# Patient Record
Sex: Female | Born: 1980
Health system: Southern US, Community
[De-identification: ages and names within clinical notes are randomized; demographics above are authoritative.]

## PROBLEM LIST (undated history)

## (undated) ENCOUNTER — Inpatient Hospital Stay (HOSPITAL_COMMUNITY): Payer: Self-pay

## (undated) ENCOUNTER — Ambulatory Visit (HOSPITAL_COMMUNITY): Admission: EM | Payer: MEDICAID | Source: Home / Self Care

## (undated) DIAGNOSIS — N390 Urinary tract infection, site not specified: Secondary | ICD-10-CM

## (undated) DIAGNOSIS — N949 Unspecified condition associated with female genital organs and menstrual cycle: Secondary | ICD-10-CM

## (undated) DIAGNOSIS — I10 Essential (primary) hypertension: Secondary | ICD-10-CM

## (undated) DIAGNOSIS — O24419 Gestational diabetes mellitus in pregnancy, unspecified control: Secondary | ICD-10-CM

## (undated) HISTORY — DX: Unspecified condition associated with female genital organs and menstrual cycle: N94.9

## (undated) HISTORY — PX: NO PAST SURGERIES: SHX2092

## (undated) HISTORY — DX: Urinary tract infection, site not specified: N39.0

---

## 2001-06-23 ENCOUNTER — Encounter: Admission: RE | Admit: 2001-06-23 | Discharge: 2001-06-23 | Payer: Self-pay | Admitting: Family Medicine

## 2001-11-16 ENCOUNTER — Emergency Department (HOSPITAL_COMMUNITY): Admission: EM | Admit: 2001-11-16 | Discharge: 2001-11-16 | Payer: Self-pay | Admitting: Emergency Medicine

## 2001-11-16 ENCOUNTER — Emergency Department (HOSPITAL_COMMUNITY): Admission: EM | Admit: 2001-11-16 | Discharge: 2001-11-17 | Payer: Self-pay | Admitting: Emergency Medicine

## 2001-11-17 ENCOUNTER — Emergency Department (HOSPITAL_COMMUNITY): Admission: EM | Admit: 2001-11-17 | Discharge: 2001-11-17 | Payer: Self-pay | Admitting: Emergency Medicine

## 2001-11-19 ENCOUNTER — Emergency Department (HOSPITAL_COMMUNITY): Admission: EM | Admit: 2001-11-19 | Discharge: 2001-11-19 | Payer: Self-pay | Admitting: Emergency Medicine

## 2001-11-23 ENCOUNTER — Emergency Department (HOSPITAL_COMMUNITY): Admission: EM | Admit: 2001-11-23 | Discharge: 2001-11-23 | Payer: Self-pay | Admitting: Emergency Medicine

## 2001-12-03 ENCOUNTER — Emergency Department (HOSPITAL_COMMUNITY): Admission: EM | Admit: 2001-12-03 | Discharge: 2001-12-03 | Payer: Self-pay | Admitting: Emergency Medicine

## 2001-12-05 ENCOUNTER — Emergency Department (HOSPITAL_COMMUNITY): Admission: EM | Admit: 2001-12-05 | Discharge: 2001-12-05 | Payer: Self-pay | Admitting: Emergency Medicine

## 2001-12-10 ENCOUNTER — Emergency Department (HOSPITAL_COMMUNITY): Admission: EM | Admit: 2001-12-10 | Discharge: 2001-12-10 | Payer: Self-pay | Admitting: Emergency Medicine

## 2001-12-11 ENCOUNTER — Emergency Department (HOSPITAL_COMMUNITY): Admission: EM | Admit: 2001-12-11 | Discharge: 2001-12-11 | Payer: Self-pay | Admitting: Emergency Medicine

## 2001-12-15 ENCOUNTER — Emergency Department (HOSPITAL_COMMUNITY): Admission: EM | Admit: 2001-12-15 | Discharge: 2001-12-15 | Payer: Self-pay | Admitting: Emergency Medicine

## 2001-12-20 ENCOUNTER — Emergency Department (HOSPITAL_COMMUNITY): Admission: EM | Admit: 2001-12-20 | Discharge: 2001-12-20 | Payer: Self-pay | Admitting: Emergency Medicine

## 2001-12-20 ENCOUNTER — Encounter: Payer: Self-pay | Admitting: Emergency Medicine

## 2002-01-02 ENCOUNTER — Inpatient Hospital Stay (HOSPITAL_COMMUNITY): Admission: EM | Admit: 2002-01-02 | Discharge: 2002-01-12 | Payer: Self-pay | Admitting: Psychiatry

## 2002-01-24 ENCOUNTER — Emergency Department (HOSPITAL_COMMUNITY): Admission: EM | Admit: 2002-01-24 | Discharge: 2002-01-24 | Payer: Self-pay | Admitting: Emergency Medicine

## 2002-12-12 ENCOUNTER — Emergency Department (HOSPITAL_COMMUNITY): Admission: EM | Admit: 2002-12-12 | Discharge: 2002-12-12 | Payer: Self-pay | Admitting: Emergency Medicine

## 2003-07-09 ENCOUNTER — Emergency Department (HOSPITAL_COMMUNITY): Admission: EM | Admit: 2003-07-09 | Discharge: 2003-07-09 | Payer: Self-pay | Admitting: Emergency Medicine

## 2004-01-10 ENCOUNTER — Emergency Department (HOSPITAL_COMMUNITY): Admission: EM | Admit: 2004-01-10 | Discharge: 2004-01-10 | Payer: Self-pay | Admitting: Emergency Medicine

## 2004-01-21 ENCOUNTER — Emergency Department (HOSPITAL_COMMUNITY): Admission: EM | Admit: 2004-01-21 | Discharge: 2004-01-21 | Payer: Self-pay | Admitting: Emergency Medicine

## 2004-02-04 ENCOUNTER — Inpatient Hospital Stay (HOSPITAL_COMMUNITY): Admission: AD | Admit: 2004-02-04 | Discharge: 2004-02-04 | Payer: Self-pay | Admitting: Family Medicine

## 2004-03-05 ENCOUNTER — Ambulatory Visit: Payer: Self-pay | Admitting: Sports Medicine

## 2004-03-12 ENCOUNTER — Ambulatory Visit: Payer: Self-pay | Admitting: Family Medicine

## 2004-03-16 ENCOUNTER — Ambulatory Visit (HOSPITAL_COMMUNITY): Admission: RE | Admit: 2004-03-16 | Discharge: 2004-03-16 | Payer: Self-pay | Admitting: Family Medicine

## 2004-04-02 ENCOUNTER — Ambulatory Visit: Payer: Self-pay | Admitting: Family Medicine

## 2004-04-09 ENCOUNTER — Ambulatory Visit (HOSPITAL_COMMUNITY): Admission: RE | Admit: 2004-04-09 | Discharge: 2004-04-09 | Payer: Self-pay | Admitting: Family Medicine

## 2004-04-16 ENCOUNTER — Ambulatory Visit: Payer: Self-pay | Admitting: Sports Medicine

## 2004-05-15 ENCOUNTER — Ambulatory Visit: Payer: Self-pay | Admitting: Sports Medicine

## 2004-06-18 ENCOUNTER — Ambulatory Visit: Payer: Self-pay | Admitting: Family Medicine

## 2004-06-24 ENCOUNTER — Ambulatory Visit: Payer: Self-pay | Admitting: Family Medicine

## 2004-07-03 ENCOUNTER — Ambulatory Visit: Payer: Self-pay | Admitting: Family Medicine

## 2004-07-09 ENCOUNTER — Encounter (INDEPENDENT_AMBULATORY_CARE_PROVIDER_SITE_OTHER): Payer: Self-pay | Admitting: *Deleted

## 2004-07-09 LAB — CONVERTED CEMR LAB

## 2004-07-21 ENCOUNTER — Encounter: Admission: RE | Admit: 2004-07-21 | Discharge: 2004-07-21 | Payer: Self-pay | Admitting: Family Medicine

## 2004-08-05 ENCOUNTER — Ambulatory Visit: Payer: Self-pay | Admitting: Family Medicine

## 2004-08-05 ENCOUNTER — Other Ambulatory Visit: Admission: RE | Admit: 2004-08-05 | Discharge: 2004-08-05 | Payer: Self-pay | Admitting: Family Medicine

## 2004-08-12 ENCOUNTER — Ambulatory Visit: Payer: Self-pay | Admitting: Family Medicine

## 2004-08-21 ENCOUNTER — Ambulatory Visit: Payer: Self-pay | Admitting: Family Medicine

## 2004-08-26 ENCOUNTER — Ambulatory Visit: Payer: Self-pay | Admitting: Family Medicine

## 2004-09-02 ENCOUNTER — Ambulatory Visit: Payer: Self-pay | Admitting: Family Medicine

## 2004-09-07 ENCOUNTER — Ambulatory Visit: Payer: Self-pay | Admitting: Family Medicine

## 2004-09-10 ENCOUNTER — Inpatient Hospital Stay (HOSPITAL_COMMUNITY): Admission: AD | Admit: 2004-09-10 | Discharge: 2004-09-13 | Payer: Self-pay | Admitting: Obstetrics and Gynecology

## 2004-09-10 ENCOUNTER — Ambulatory Visit: Payer: Self-pay | Admitting: Obstetrics and Gynecology

## 2004-11-13 ENCOUNTER — Emergency Department (HOSPITAL_COMMUNITY): Admission: EM | Admit: 2004-11-13 | Discharge: 2004-11-13 | Payer: Self-pay | Admitting: Emergency Medicine

## 2004-11-19 ENCOUNTER — Ambulatory Visit: Payer: Self-pay | Admitting: Family Medicine

## 2005-03-23 ENCOUNTER — Ambulatory Visit: Payer: Self-pay | Admitting: Sports Medicine

## 2005-04-02 ENCOUNTER — Ambulatory Visit: Payer: Self-pay | Admitting: Family Medicine

## 2005-04-05 ENCOUNTER — Ambulatory Visit (HOSPITAL_COMMUNITY): Admission: RE | Admit: 2005-04-05 | Discharge: 2005-04-05 | Payer: Self-pay | Admitting: Internal Medicine

## 2005-04-06 ENCOUNTER — Ambulatory Visit: Payer: Self-pay | Admitting: Sports Medicine

## 2005-06-02 ENCOUNTER — Ambulatory Visit: Payer: Self-pay | Admitting: Family Medicine

## 2005-06-07 ENCOUNTER — Ambulatory Visit (HOSPITAL_COMMUNITY): Admission: RE | Admit: 2005-06-07 | Discharge: 2005-06-07 | Payer: Self-pay | Admitting: Sports Medicine

## 2005-07-28 ENCOUNTER — Ambulatory Visit: Payer: Self-pay | Admitting: Family Medicine

## 2005-08-10 ENCOUNTER — Inpatient Hospital Stay (HOSPITAL_COMMUNITY): Admission: AD | Admit: 2005-08-10 | Discharge: 2005-08-10 | Payer: Self-pay | Admitting: Family Medicine

## 2005-08-16 ENCOUNTER — Ambulatory Visit: Payer: Self-pay | Admitting: Obstetrics & Gynecology

## 2005-09-08 ENCOUNTER — Ambulatory Visit: Payer: Self-pay | Admitting: Obstetrics and Gynecology

## 2005-09-08 ENCOUNTER — Inpatient Hospital Stay (HOSPITAL_COMMUNITY): Admission: AD | Admit: 2005-09-08 | Discharge: 2005-09-11 | Payer: Self-pay | Admitting: Family Medicine

## 2005-11-02 ENCOUNTER — Ambulatory Visit: Payer: Self-pay | Admitting: Family Medicine

## 2005-11-27 ENCOUNTER — Emergency Department (HOSPITAL_COMMUNITY): Admission: EM | Admit: 2005-11-27 | Discharge: 2005-11-27 | Payer: Self-pay | Admitting: Emergency Medicine

## 2005-12-23 ENCOUNTER — Emergency Department (HOSPITAL_COMMUNITY): Admission: EM | Admit: 2005-12-23 | Discharge: 2005-12-23 | Payer: Self-pay | Admitting: Emergency Medicine

## 2005-12-28 ENCOUNTER — Ambulatory Visit: Payer: Self-pay | Admitting: Family Medicine

## 2006-02-05 ENCOUNTER — Emergency Department (HOSPITAL_COMMUNITY): Admission: EM | Admit: 2006-02-05 | Discharge: 2006-02-05 | Payer: Self-pay | Admitting: Emergency Medicine

## 2006-02-19 ENCOUNTER — Emergency Department (HOSPITAL_COMMUNITY): Admission: EM | Admit: 2006-02-19 | Discharge: 2006-02-19 | Payer: Self-pay | Admitting: Family Medicine

## 2006-03-18 ENCOUNTER — Emergency Department (HOSPITAL_COMMUNITY): Admission: EM | Admit: 2006-03-18 | Discharge: 2006-03-18 | Payer: Self-pay | Admitting: Family Medicine

## 2006-03-23 ENCOUNTER — Emergency Department (HOSPITAL_COMMUNITY): Admission: EM | Admit: 2006-03-23 | Discharge: 2006-03-23 | Payer: Self-pay | Admitting: Family Medicine

## 2006-03-24 ENCOUNTER — Encounter: Payer: Self-pay | Admitting: Family Medicine

## 2006-03-24 ENCOUNTER — Ambulatory Visit: Payer: Self-pay | Admitting: Family Medicine

## 2006-03-24 LAB — CONVERTED CEMR LAB
ALT: 16 units/L (ref 0–35)
Albumin: 4.2 g/dL (ref 3.5–5.2)
BUN: 10 mg/dL (ref 6–23)
Chloride: 104 meq/L (ref 96–112)
Creatinine, Ser: 0.64 mg/dL (ref 0.40–1.20)
Potassium: 4.3 meq/L (ref 3.5–5.3)
Sodium: 138 meq/L (ref 135–145)
Total Bilirubin: 0.4 mg/dL (ref 0.3–1.2)

## 2006-04-07 DIAGNOSIS — Z6841 Body Mass Index (BMI) 40.0 and over, adult: Secondary | ICD-10-CM

## 2006-04-08 ENCOUNTER — Encounter (INDEPENDENT_AMBULATORY_CARE_PROVIDER_SITE_OTHER): Payer: Self-pay | Admitting: *Deleted

## 2006-04-13 ENCOUNTER — Telehealth: Payer: Self-pay | Admitting: *Deleted

## 2006-05-10 ENCOUNTER — Emergency Department (HOSPITAL_COMMUNITY): Admission: EM | Admit: 2006-05-10 | Discharge: 2006-05-10 | Payer: Self-pay | Admitting: Family Medicine

## 2006-06-21 ENCOUNTER — Emergency Department (HOSPITAL_COMMUNITY): Admission: EM | Admit: 2006-06-21 | Discharge: 2006-06-21 | Payer: Self-pay | Admitting: Family Medicine

## 2006-09-01 ENCOUNTER — Telehealth (INDEPENDENT_AMBULATORY_CARE_PROVIDER_SITE_OTHER): Payer: Self-pay | Admitting: *Deleted

## 2006-09-06 ENCOUNTER — Ambulatory Visit: Payer: Self-pay | Admitting: Family Medicine

## 2006-09-06 IMAGING — US US OB COMP +14 WK
1 series · 13 of 28 positions shown · non-contrast
Comparison: none

CLINICAL DATA: Uncertain dates.  Patient was on Depo-Provera.

[Series 1: us ob comp +14 wk · 0.29mm/px · 13 of 90 slices shown]
[im 4/90]
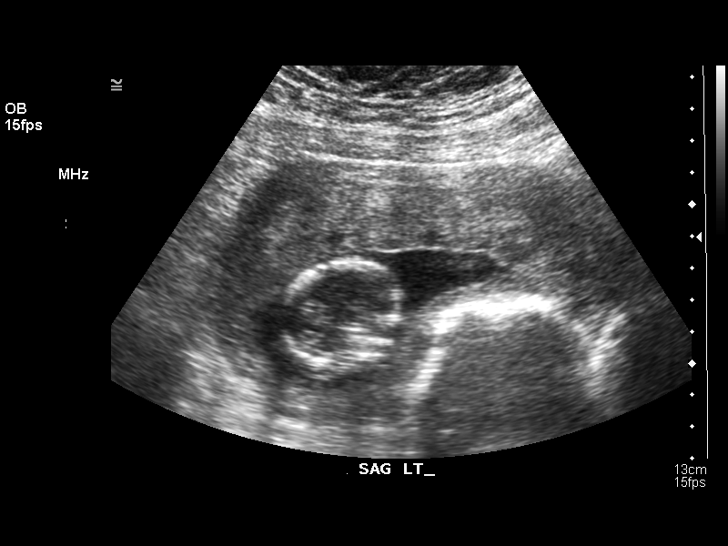
[im 10/90]
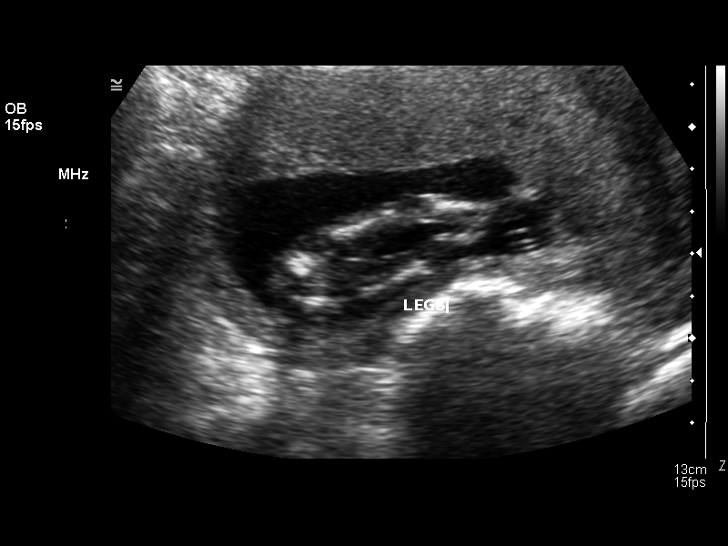
[im 17/90]
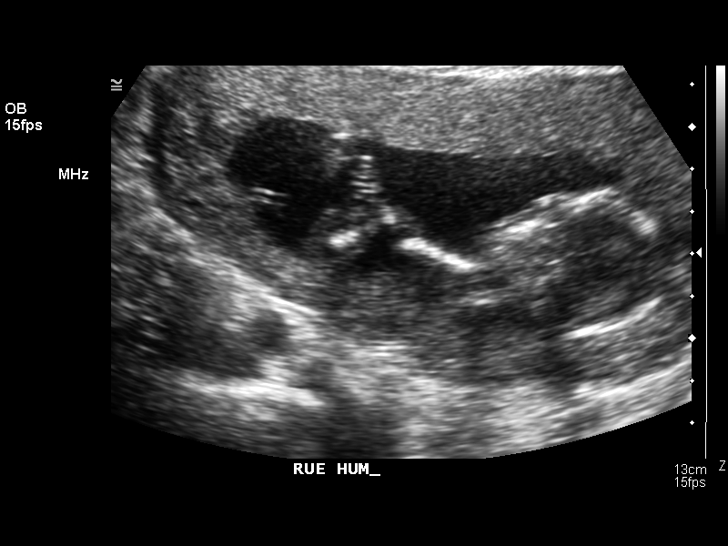
[im 24/90]
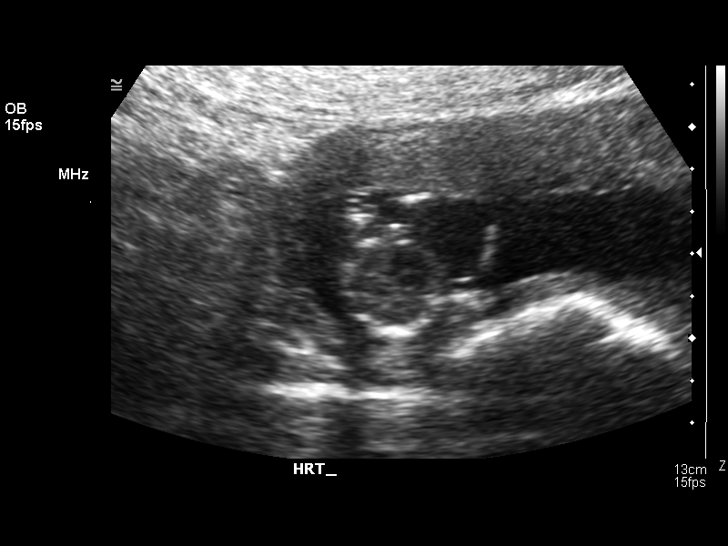
[im 30/90]
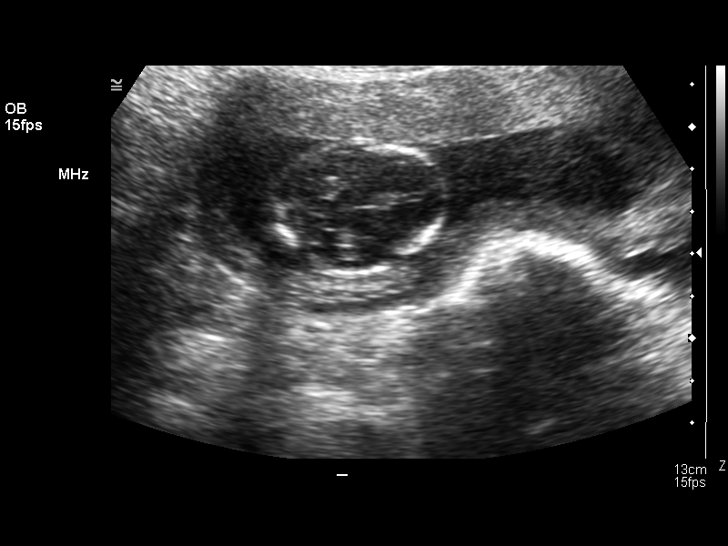
[im 37/90]
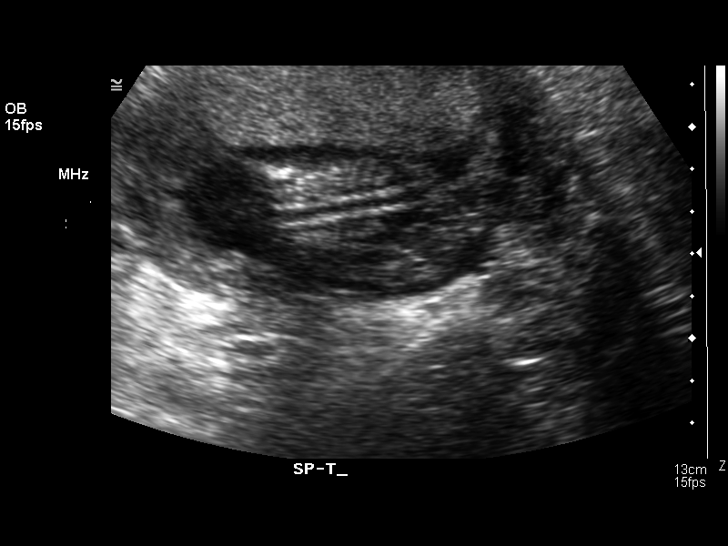
[im 47/90]
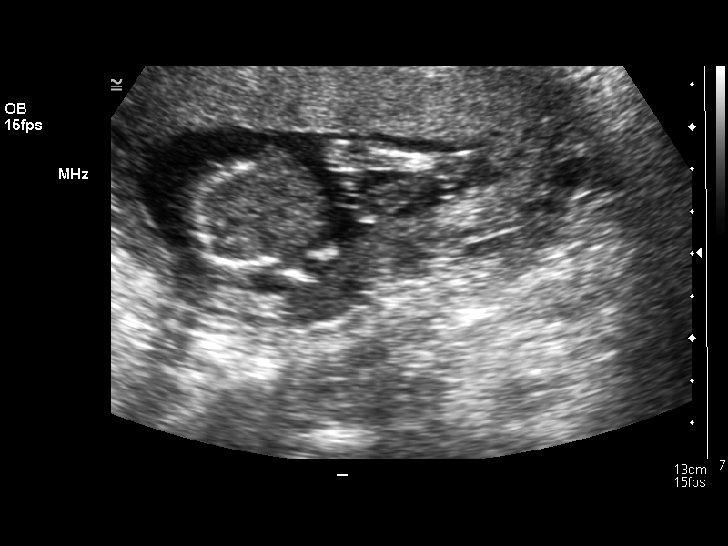
[im 53/90]
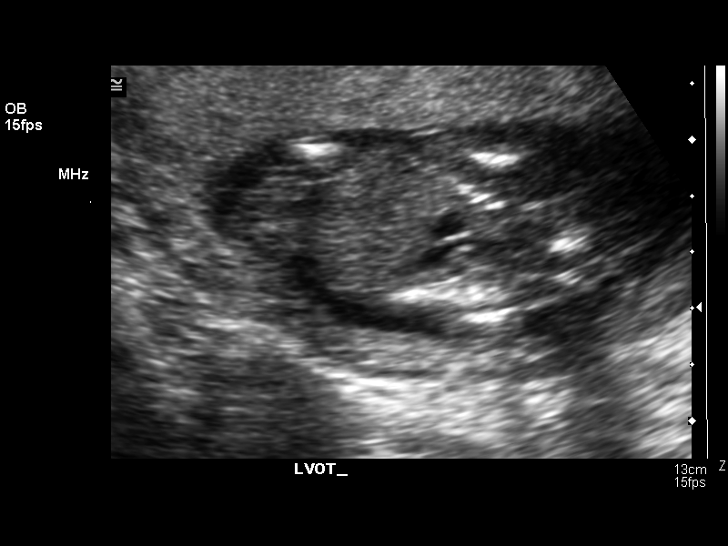
[im 60/90]
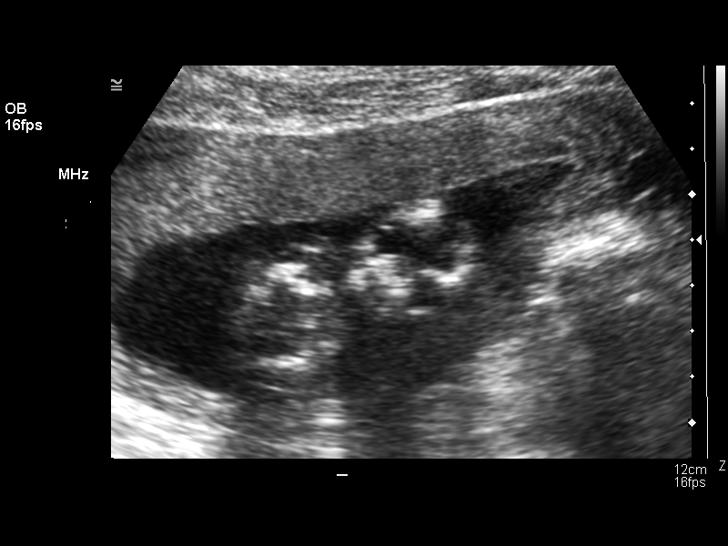
[im 66/90]
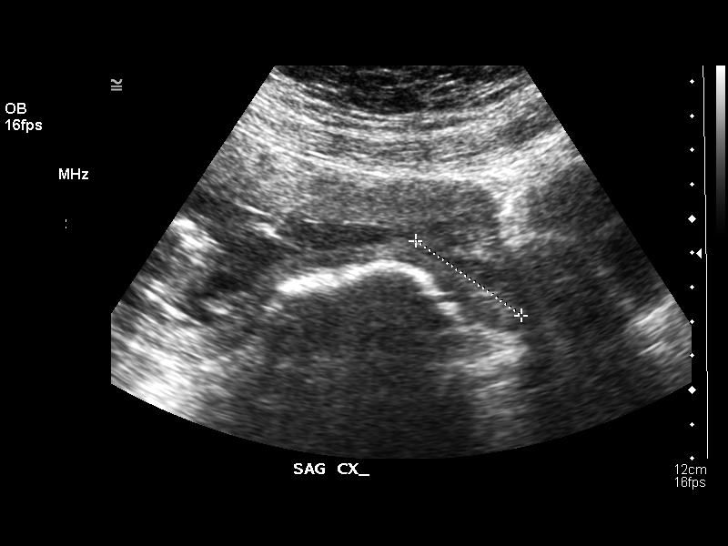
[im 73/90]
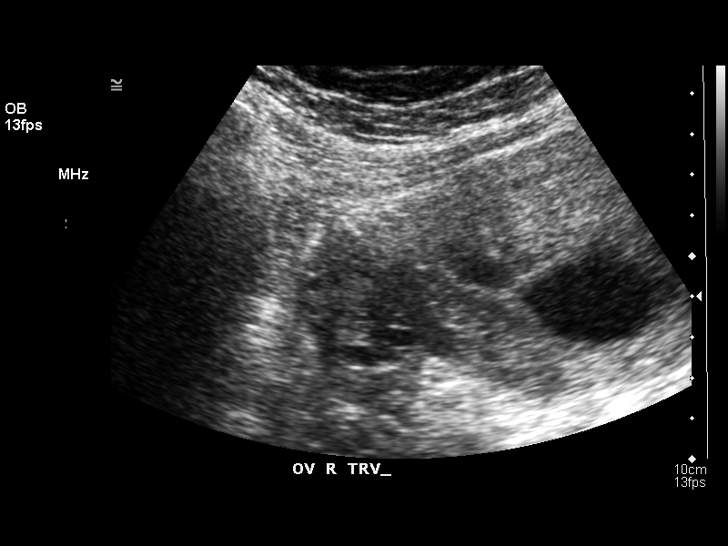
[im 80/90]
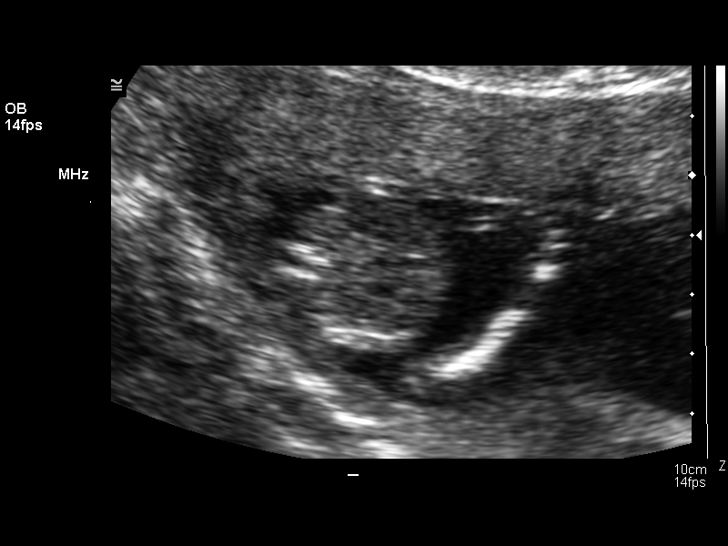
[im 86/90]
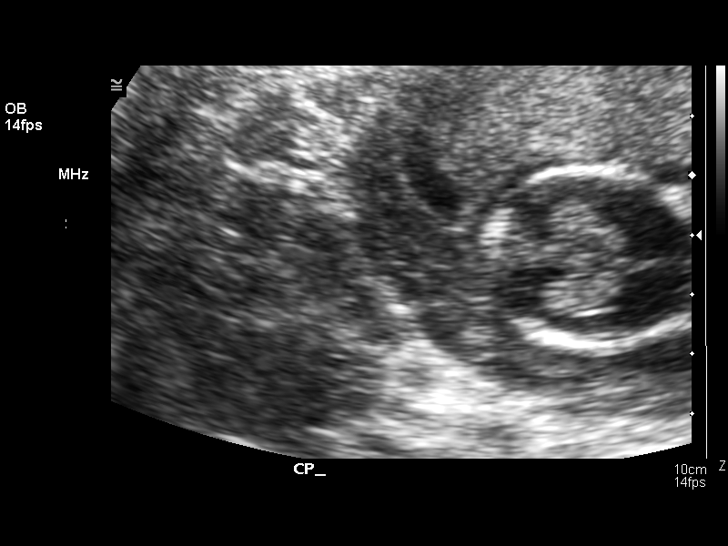

[13 of 28 positions shown; findings below may reference images not displayed]

OBSTETRICAL ULTRASOUND:
Number of Fetuses:  1
Heart Rate:  157
Movement:  Yes
Breathing:  No  
Presentation:    Variable
Placental Location:  Anterior
Grade: I
Previa:  No
Amniotic Fluid (Subjective):  Normal
Amniotic Fluid (Objective):   3.2 cm Vertical pocket 

FETAL BIOMETRY
BPD:  3.0 cm   15 w 4 d
HC:  11.2 cm   15 w 3 d
AC:  9.2 cm   15 w 3 d
FL:  1.8 cm   15 w 3 d

MEAN GA:  15 w 3 d
Assigned GA:  20 w 3 d

FETAL ANATOMY
Lateral Ventricles:    Visualized 
Thalami/CSP:      Visualized 
Posterior Fossa:  Visualized 
Stomach on Left:      Visualized 
3 Vessel Cord:    Visualized 
Kidneys:  Visualized 
Bladder:  Visualized 

Evaluation limited by:  Maternal habitus and early gestational age 

MATERNAL UTERINE AND ADNEXAL FINDINGS
Cervix:   Not evaluated.  Cervix deviates to the left.  
Both ovaries are seen with the right ovary measuring 3.7 x 1.9 x 2.4 cm and the left measuring 3.9 x 2.1 x 2.2 cm.
IMPRESSION: 1.  Single intrauterine pregnancy demonstrating an estimated gestational age by ultrasound of 15 weeks and 3 days.  This is 5 weeks behind expected estimated gestational age of 20 weeks and 3 days by uncertain LMP and suggests inappropriate dating by LMP.  
2.  A limited anatomic exam was possible due to the current estimated gestational age.  Follow-up for growth is recommended in three weeks to assess for allowance of improved anatomic assessment.  
3.  Normal ovaries. 

</u12:p>

## 2006-09-29 ENCOUNTER — Telehealth (INDEPENDENT_AMBULATORY_CARE_PROVIDER_SITE_OTHER): Payer: Self-pay | Admitting: *Deleted

## 2006-09-30 IMAGING — US US OB FOLLOW-UP
1 series · 13 of 28 positions shown · non-contrast
Comparison: none

CLINICAL DATA: Reassess fetal anatomy.

[Series 1: us ob follow-up · 0.28mm/px · 13 of 39 slices shown]
[im 2/39]
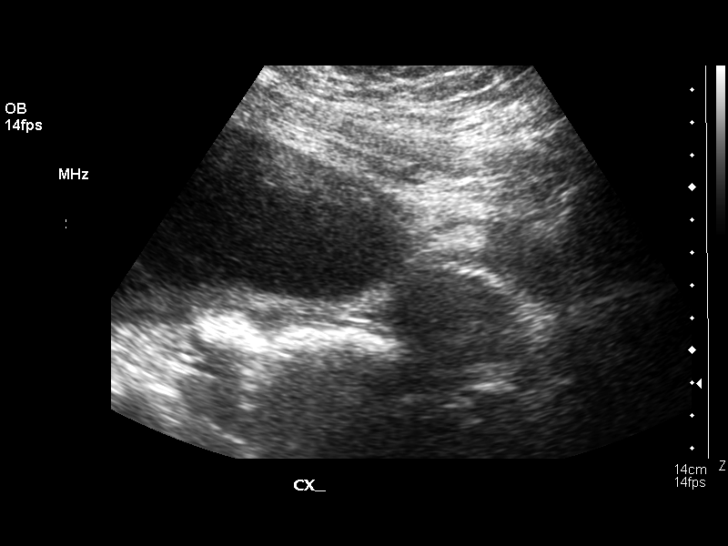
[im 5/39]
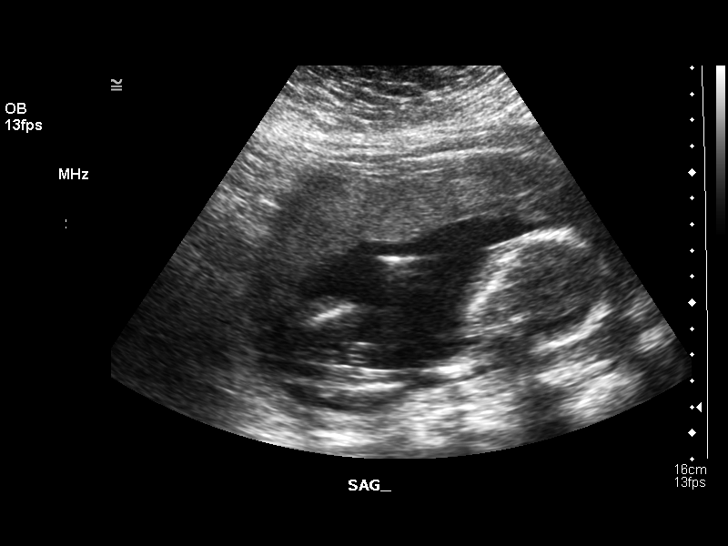
[im 8/39]
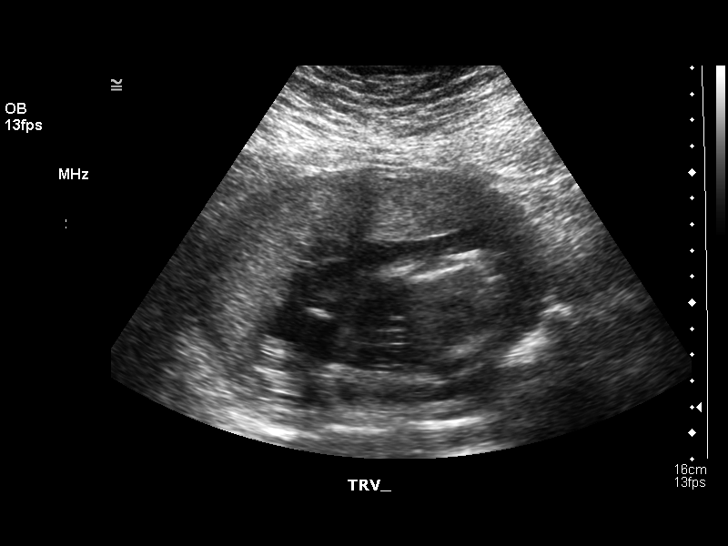
[im 10/39]
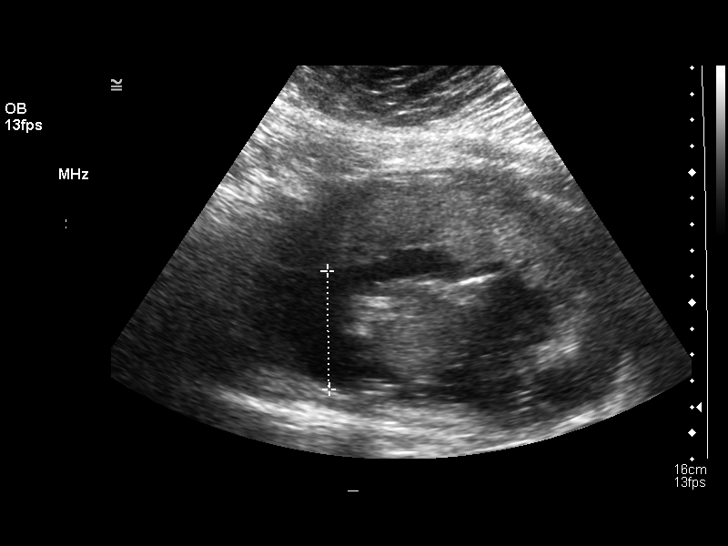
[im 13/39]
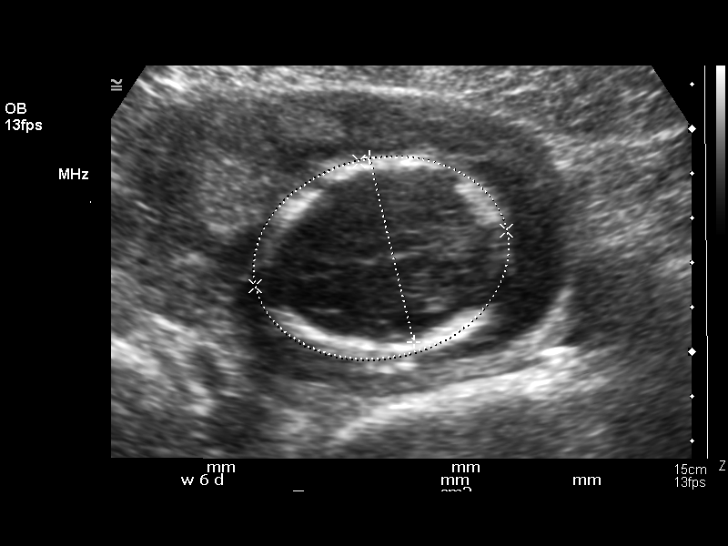
[im 16/39]
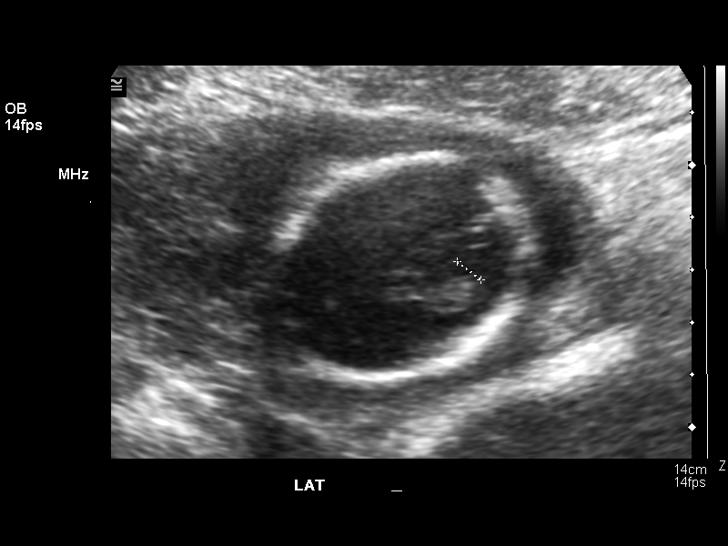
[im 20/39]
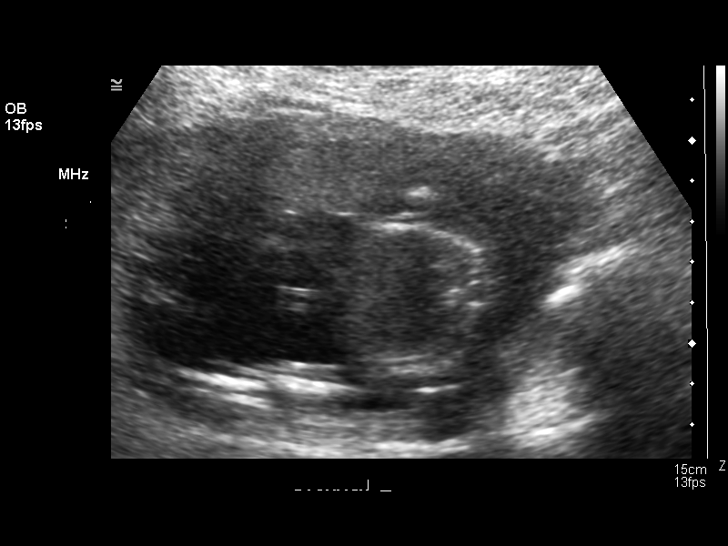
[im 23/39]
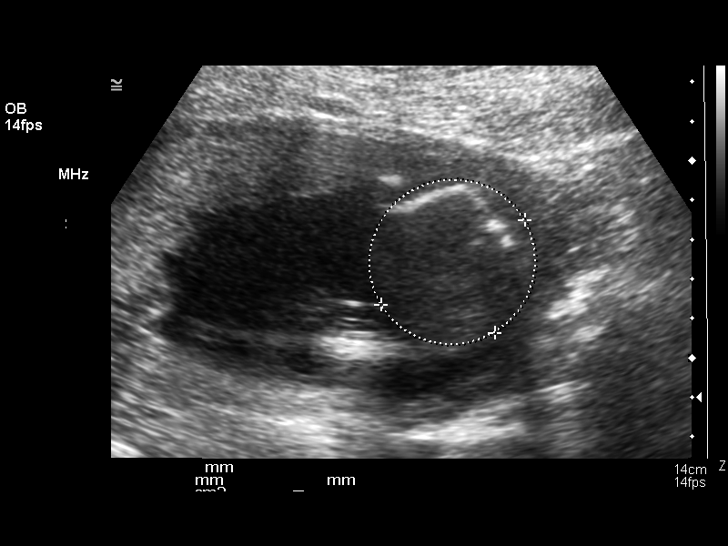
[im 26/39]
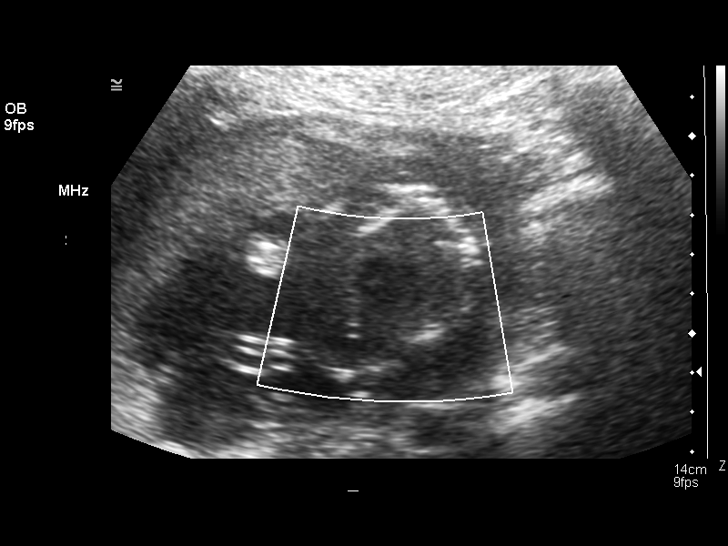
[im 29/39]
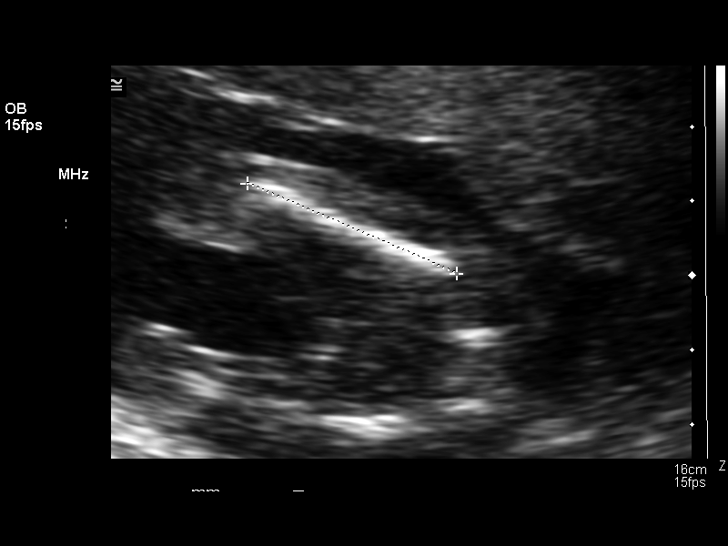
[im 31/39]
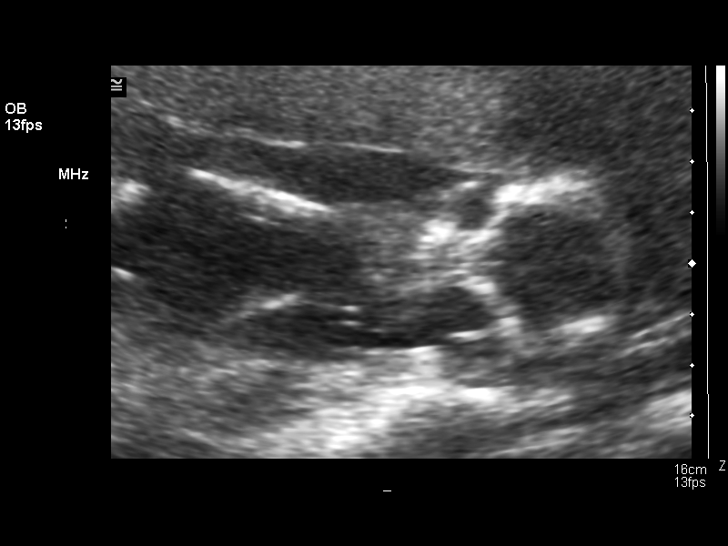
[im 34/39]
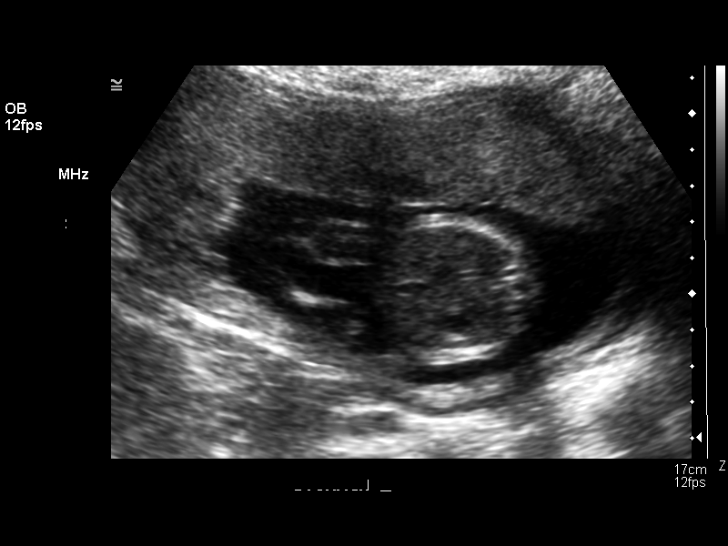
[im 37/39]
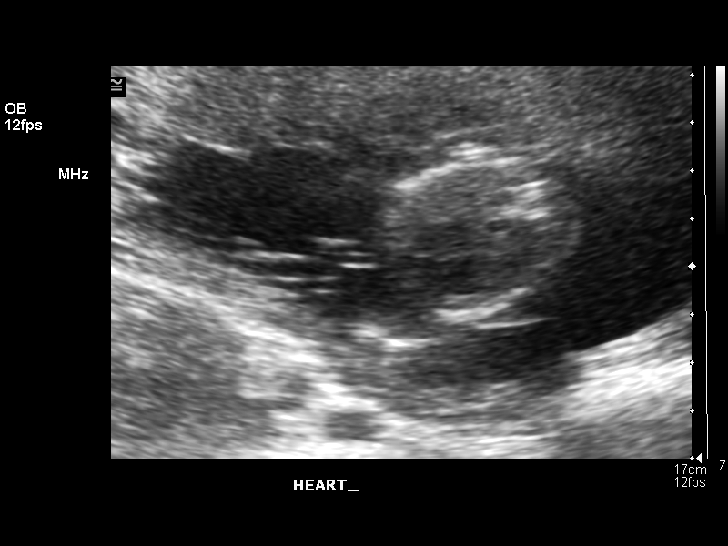

[13 of 28 positions shown; findings below may reference images not displayed]

OBSTETRICAL ULTRASOUND RE-EVALUATION:
Overall scan resolution was markedly hampered by maternal body habitus and limited scanning planes.
Number of Fetuses:  1
Heart Rate:  147
Movement:  Yes
Breathing:  No
Presentation:  Cephalic
Placental Location:  Anterior
Grade:  I
Previa:  No
Amniotic Fluid (subjective):  Normal
Amniotic Fluid (objective):  4.5 cm Vertical pocket 

FETAL BIOMETRY
BPD:  4.3 cm   18 w 6 d
HC:  16.0 cm   18 w 6 d
AC:  13.3 cm   18 w 6 d
FL:  3.0 cm   19 w 3 d

Mean GA:  19 w 0 d
Assigned GA:  18 w 6 d

FETAL ANATOMY
Lateral Ventricles:  Visualized 
Thalami/CSP:  Visualized 
Posterior Fossa:  Visualized  
Nuchal Region:  Visualized 
Spine:  Not visualized  
4 Chamber Heart on Left:  Not visualized 
Stomach on Left:  Visualized 
3 Vessel Cord:  Visualized 
Cord Insertion Site:  Visualized 
Kidneys:  Visualized 
Bladder:  Visualized 
Extremities:  Previously seen 

Evaluation limited by:  Maternal habitus.

MATERNAL UTERINE AND ADNEXAL FINDINGS
Cervix:  3.1 cm transabdominally
IMPRESSION: 1. Single intrauterine pregnancy demonstrating an estimated gestational age by ultrasound of 19 weeks and 0 days. Correlation with expected estimated gestational age by early ultrasound of 18 weeks and 6 days suggests appropriate growth.  The anatomic exam remains limited due to maternal habitus combined with fetal positioning and the fetal heart, spine face, heel and 5th digit could not be adequately assessed today. Follow-up to reassess the heart and spine would be recommended.

## 2006-10-03 ENCOUNTER — Telehealth (INDEPENDENT_AMBULATORY_CARE_PROVIDER_SITE_OTHER): Payer: Self-pay | Admitting: *Deleted

## 2006-10-03 ENCOUNTER — Encounter: Payer: Self-pay | Admitting: *Deleted

## 2006-10-04 ENCOUNTER — Ambulatory Visit: Payer: Self-pay | Admitting: Family Medicine

## 2006-10-04 LAB — CONVERTED CEMR LAB
Bilirubin Urine: NEGATIVE
Nitrite: NEGATIVE
Protein, U semiquant: NEGATIVE
Urobilinogen, UA: 4
pH: 7.5

## 2006-11-08 ENCOUNTER — Ambulatory Visit: Payer: Self-pay | Admitting: Family Medicine

## 2006-11-23 ENCOUNTER — Telehealth (INDEPENDENT_AMBULATORY_CARE_PROVIDER_SITE_OTHER): Payer: Self-pay | Admitting: *Deleted

## 2006-11-23 ENCOUNTER — Ambulatory Visit: Payer: Self-pay | Admitting: Family Medicine

## 2006-11-23 LAB — CONVERTED CEMR LAB: Beta hcg, urine, semiquantitative: NEGATIVE

## 2006-11-25 ENCOUNTER — Telehealth: Payer: Self-pay | Admitting: *Deleted

## 2006-11-26 ENCOUNTER — Emergency Department (HOSPITAL_COMMUNITY): Admission: EM | Admit: 2006-11-26 | Discharge: 2006-11-26 | Payer: Self-pay | Admitting: Emergency Medicine

## 2006-11-29 ENCOUNTER — Encounter (INDEPENDENT_AMBULATORY_CARE_PROVIDER_SITE_OTHER): Payer: Self-pay | Admitting: *Deleted

## 2006-11-30 ENCOUNTER — Telehealth: Payer: Self-pay | Admitting: *Deleted

## 2006-12-01 ENCOUNTER — Encounter: Payer: Self-pay | Admitting: *Deleted

## 2006-12-06 ENCOUNTER — Encounter (INDEPENDENT_AMBULATORY_CARE_PROVIDER_SITE_OTHER): Payer: Self-pay | Admitting: *Deleted

## 2006-12-15 ENCOUNTER — Encounter: Payer: Self-pay | Admitting: Family Medicine

## 2006-12-23 ENCOUNTER — Telehealth: Payer: Self-pay | Admitting: *Deleted

## 2007-01-03 ENCOUNTER — Ambulatory Visit: Payer: Self-pay | Admitting: Family Medicine

## 2007-01-03 LAB — CONVERTED CEMR LAB: Rapid Strep: POSITIVE

## 2007-01-24 ENCOUNTER — Ambulatory Visit: Payer: Self-pay | Admitting: Sports Medicine

## 2007-01-24 LAB — CONVERTED CEMR LAB
Nitrite: NEGATIVE
Protein, U semiquant: 30
Specific Gravity, Urine: >=1.03
pH: 6

## 2007-01-26 ENCOUNTER — Emergency Department (HOSPITAL_COMMUNITY): Admission: EM | Admit: 2007-01-26 | Discharge: 2007-01-26 | Payer: Self-pay | Admitting: Emergency Medicine

## 2007-01-27 ENCOUNTER — Telehealth: Payer: Self-pay | Admitting: *Deleted

## 2007-01-31 ENCOUNTER — Telehealth: Payer: Self-pay | Admitting: *Deleted

## 2007-02-15 ENCOUNTER — Telehealth: Payer: Self-pay | Admitting: *Deleted

## 2007-02-15 ENCOUNTER — Ambulatory Visit: Payer: Self-pay | Admitting: Family Medicine

## 2007-02-15 LAB — CONVERTED CEMR LAB
Glucose, Urine, Semiquant: NEGATIVE
Specific Gravity, Urine: 1.025

## 2007-02-16 ENCOUNTER — Encounter: Payer: Self-pay | Admitting: Family Medicine

## 2007-02-20 ENCOUNTER — Ambulatory Visit: Payer: Self-pay | Admitting: Sports Medicine

## 2007-02-23 ENCOUNTER — Telehealth: Payer: Self-pay | Admitting: *Deleted

## 2007-02-24 ENCOUNTER — Emergency Department (HOSPITAL_COMMUNITY): Admission: EM | Admit: 2007-02-24 | Discharge: 2007-02-24 | Payer: Self-pay | Admitting: Emergency Medicine

## 2007-03-03 ENCOUNTER — Emergency Department (HOSPITAL_COMMUNITY): Admission: EM | Admit: 2007-03-03 | Discharge: 2007-03-03 | Payer: Self-pay | Admitting: Family Medicine

## 2007-03-06 ENCOUNTER — Telehealth (INDEPENDENT_AMBULATORY_CARE_PROVIDER_SITE_OTHER): Payer: Self-pay | Admitting: *Deleted

## 2007-03-29 ENCOUNTER — Telehealth: Payer: Self-pay | Admitting: *Deleted

## 2007-03-30 ENCOUNTER — Ambulatory Visit: Payer: Self-pay | Admitting: Family Medicine

## 2007-04-13 ENCOUNTER — Telehealth: Payer: Self-pay | Admitting: *Deleted

## 2007-04-21 ENCOUNTER — Telehealth: Payer: Self-pay | Admitting: *Deleted

## 2007-04-30 ENCOUNTER — Emergency Department (HOSPITAL_COMMUNITY): Admission: EM | Admit: 2007-04-30 | Discharge: 2007-04-30 | Payer: Self-pay | Admitting: Family Medicine

## 2007-06-28 ENCOUNTER — Emergency Department (HOSPITAL_COMMUNITY): Admission: EM | Admit: 2007-06-28 | Discharge: 2007-06-28 | Payer: Self-pay | Admitting: Emergency Medicine

## 2007-07-24 ENCOUNTER — Telehealth: Payer: Self-pay | Admitting: *Deleted

## 2007-08-09 ENCOUNTER — Ambulatory Visit: Payer: Self-pay | Admitting: Family Medicine

## 2007-08-09 LAB — CONVERTED CEMR LAB
Glucose, Urine, Semiquant: NEGATIVE
Nitrite: NEGATIVE
Protein, U semiquant: NEGATIVE
Specific Gravity, Urine: >=1.03
pH: 5.5

## 2007-08-11 ENCOUNTER — Emergency Department (HOSPITAL_COMMUNITY): Admission: EM | Admit: 2007-08-11 | Discharge: 2007-08-11 | Payer: Self-pay | Admitting: Emergency Medicine

## 2007-08-29 ENCOUNTER — Encounter (INDEPENDENT_AMBULATORY_CARE_PROVIDER_SITE_OTHER): Payer: Self-pay | Admitting: *Deleted

## 2007-08-31 ENCOUNTER — Encounter (INDEPENDENT_AMBULATORY_CARE_PROVIDER_SITE_OTHER): Payer: Self-pay | Admitting: Family Medicine

## 2007-08-31 ENCOUNTER — Ambulatory Visit: Payer: Self-pay | Admitting: Sports Medicine

## 2007-08-31 ENCOUNTER — Other Ambulatory Visit: Admission: RE | Admit: 2007-08-31 | Discharge: 2007-08-31 | Payer: Self-pay | Admitting: Family Medicine

## 2007-08-31 DIAGNOSIS — I10 Essential (primary) hypertension: Secondary | ICD-10-CM

## 2007-08-31 LAB — CONVERTED CEMR LAB
BUN: 8 mg/dL (ref 6–23)
Chlamydia, DNA Probe: NEGATIVE
Creatinine, Ser: 0.65 mg/dL (ref 0.40–1.20)
GC Probe Amp, Genital: NEGATIVE
Sodium: 138 meq/L (ref 135–145)

## 2007-09-02 ENCOUNTER — Telehealth: Payer: Self-pay | Admitting: *Deleted

## 2007-09-04 ENCOUNTER — Telehealth: Payer: Self-pay | Admitting: *Deleted

## 2007-09-07 ENCOUNTER — Encounter: Payer: Self-pay | Admitting: *Deleted

## 2007-09-18 ENCOUNTER — Telehealth: Payer: Self-pay | Admitting: *Deleted

## 2007-09-19 ENCOUNTER — Telehealth: Payer: Self-pay | Admitting: *Deleted

## 2007-09-19 ENCOUNTER — Ambulatory Visit: Payer: Self-pay | Admitting: Family Medicine

## 2007-09-19 DIAGNOSIS — R87610 Atypical squamous cells of undetermined significance on cytologic smear of cervix (ASC-US): Secondary | ICD-10-CM | POA: Insufficient documentation

## 2007-09-19 LAB — CONVERTED CEMR LAB
Ketones, urine, test strip: NEGATIVE
Specific Gravity, Urine: >=1.03
WBC Urine, dipstick: NEGATIVE
pH: 6.5

## 2007-09-25 ENCOUNTER — Inpatient Hospital Stay (HOSPITAL_COMMUNITY): Admission: AD | Admit: 2007-09-25 | Discharge: 2007-09-26 | Payer: Self-pay | Admitting: Obstetrics & Gynecology

## 2007-09-26 ENCOUNTER — Ambulatory Visit: Payer: Self-pay | Admitting: Family Medicine

## 2007-09-26 IMAGING — US US OB COMP +14 WK
1 series · 18 of 28 positions shown · non-contrast
Comparison: none

CLINICAL DATA: Dating.  Assess estimated gestational age.

[Series 1: us ob comp less 14 wks · 18 of 74 slices shown]
[im 1/74]
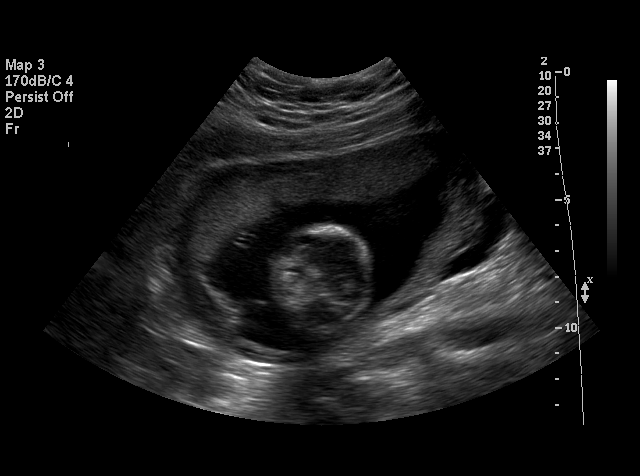
[im 6/74]
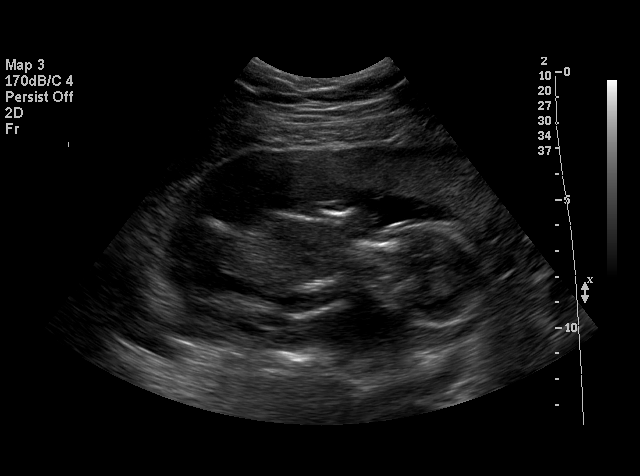
[im 9/74]
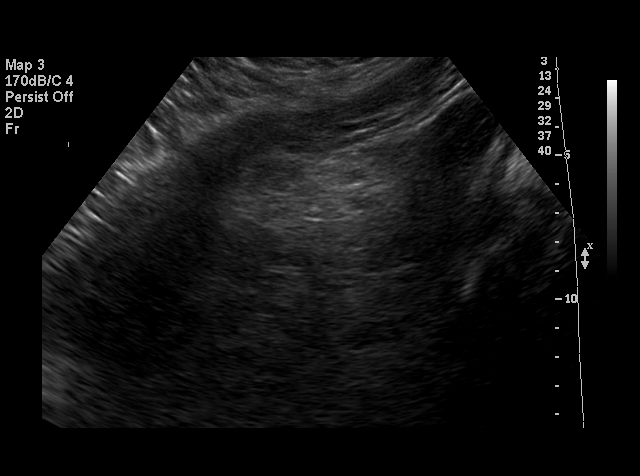
[im 14/74]
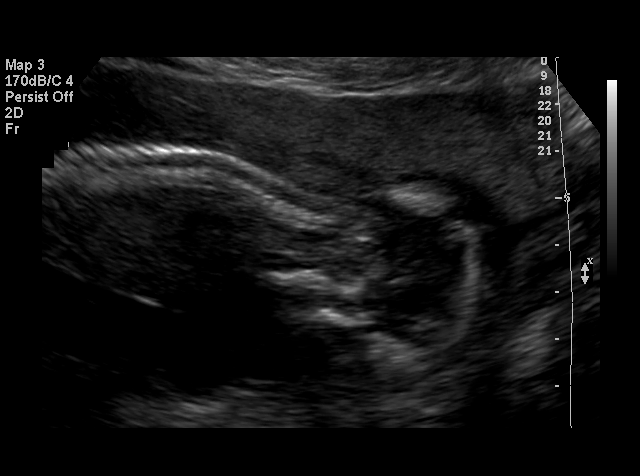
[im 19/74]
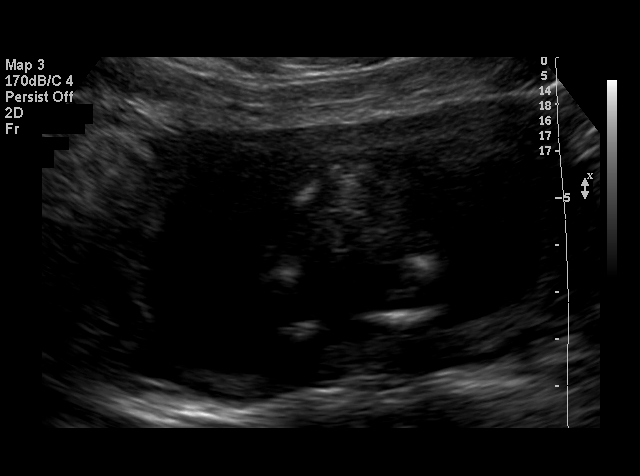
[im 22/74]
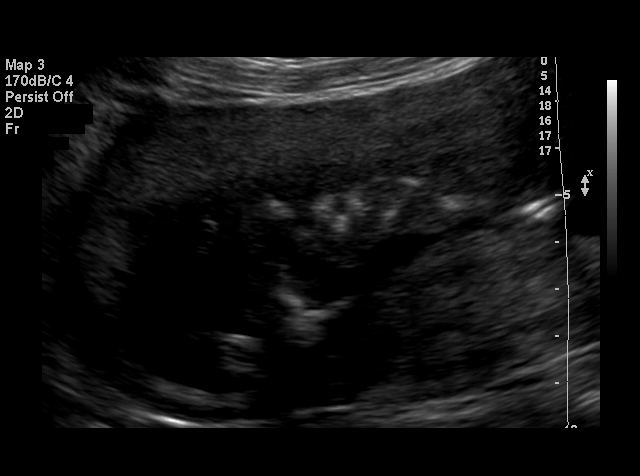
[im 28/74]
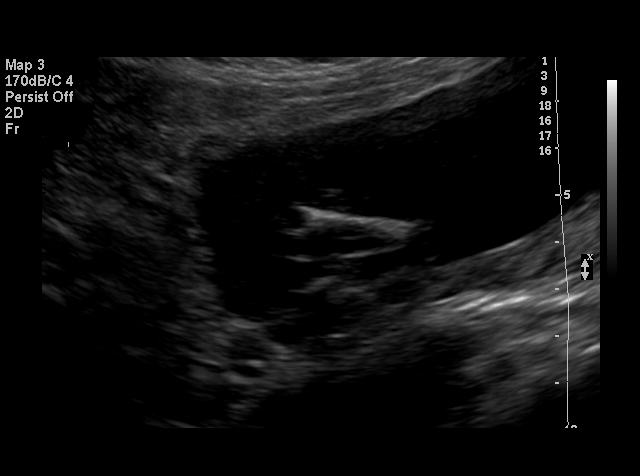
[im 30/74]
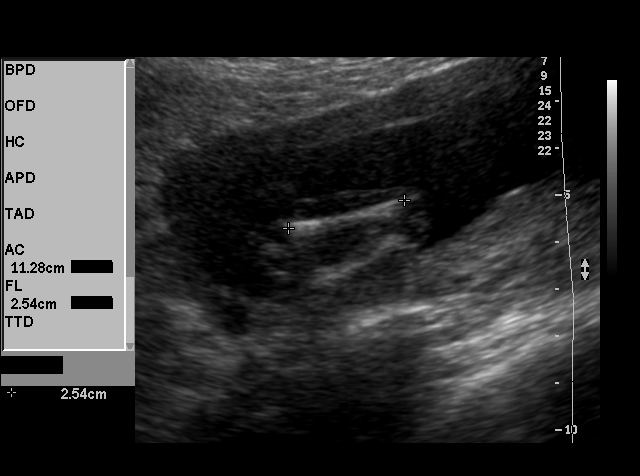
[im 36/74]
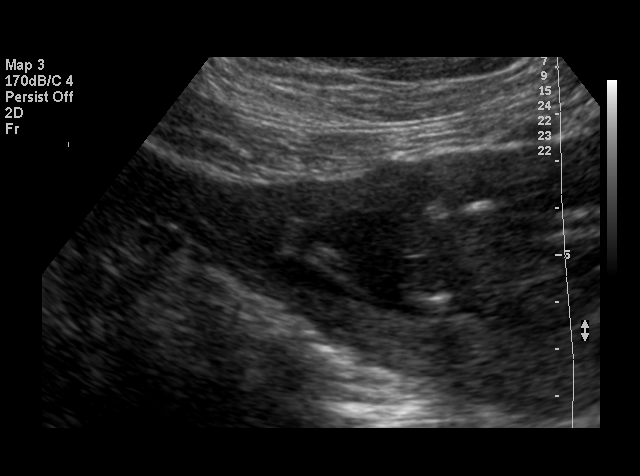
[im 38/74]
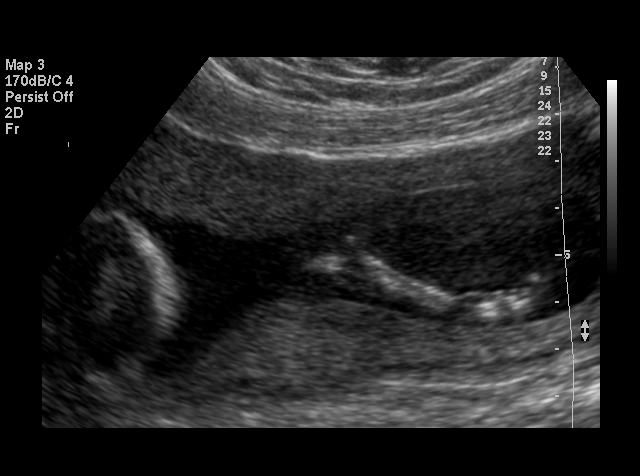
[im 44/74]
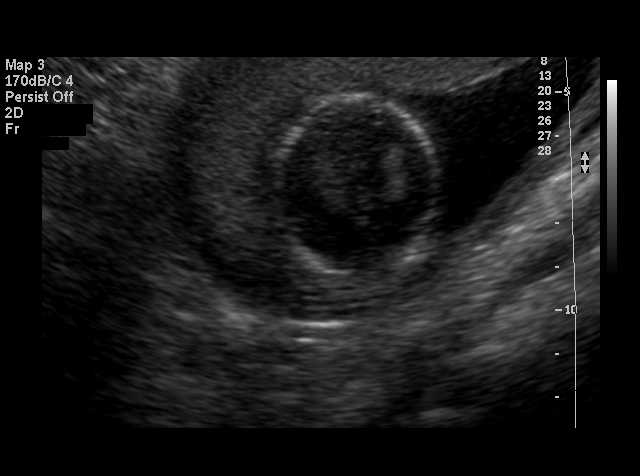
[im 46/74]
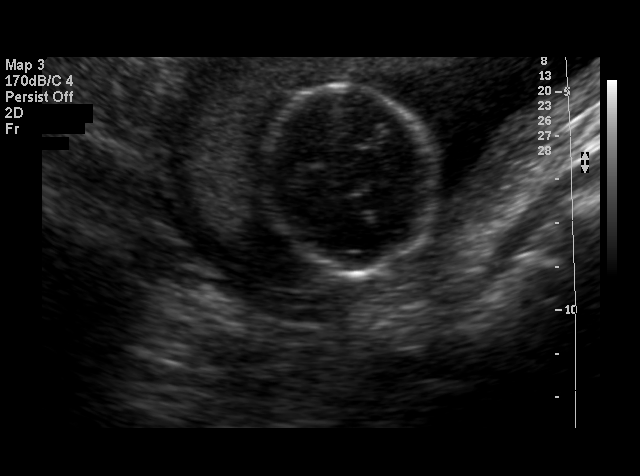
[im 52/74]
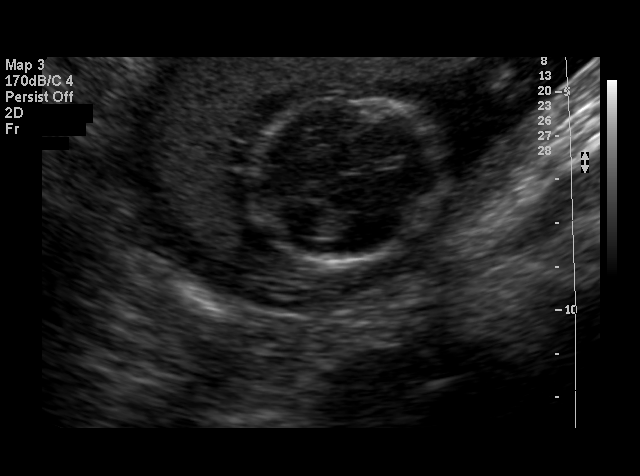
[im 57/74]
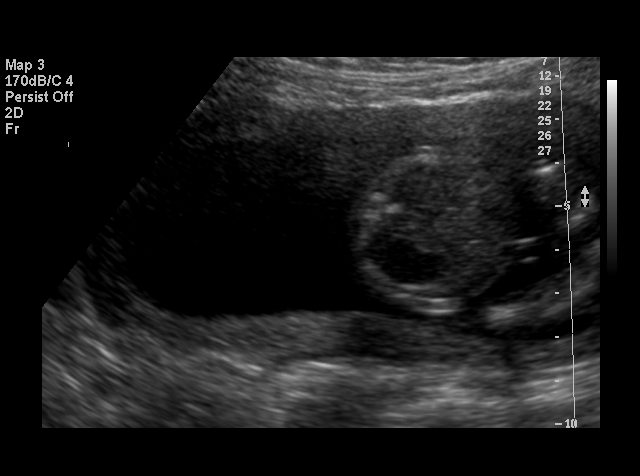
[im 60/74]
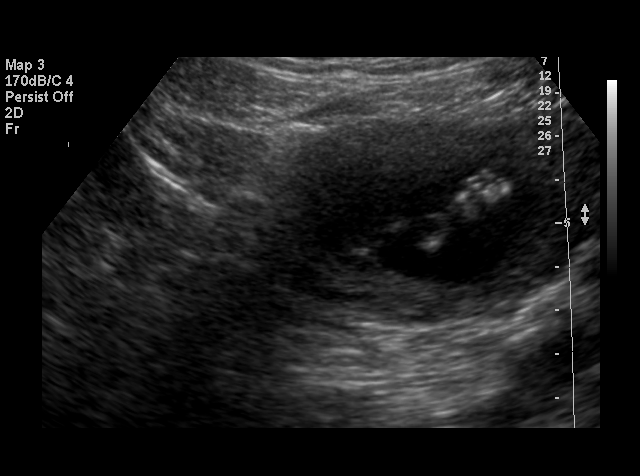
[im 65/74]
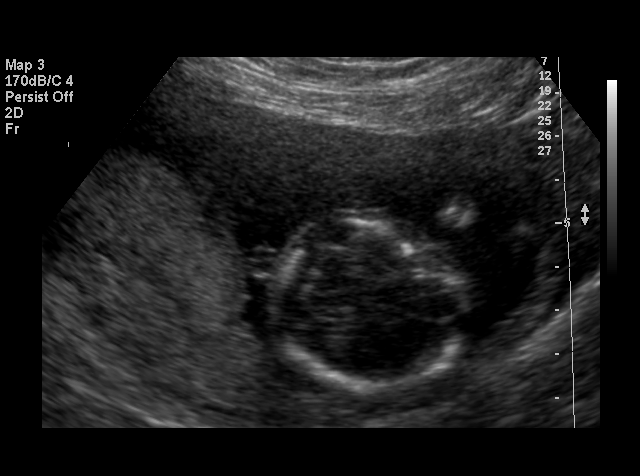
[im 68/74]
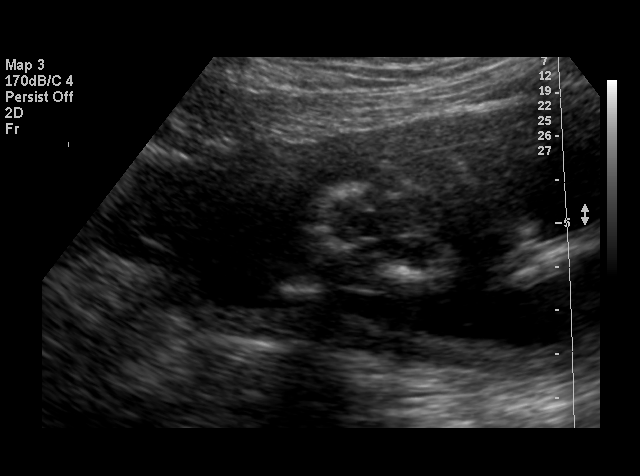
[im 74/74]
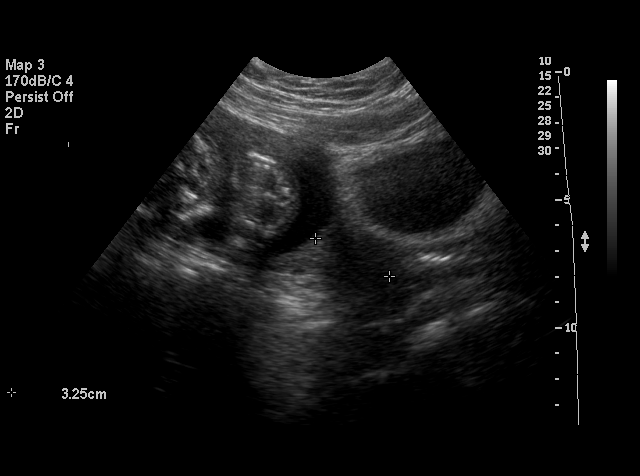

[18 of 28 positions shown; findings below may reference images not displayed]

OBSTETRICAL ULTRASOUND:
Number of Fetuses: 1
Heart Rate:  150
Movement:  Yes
Breathing:  No      
Presentation:  Transverse with head to maternal left
Placental Location:  Anterior
Grade:  I
Previa: No
Amniotic Fluid (Subjective):  Normal
Amniotic Fluid (Objective):   4.3 cm Vertical pocket 

FETAL BIOMETRY
BPD:  3.7 cm  17 w 4 d
HC:  13.6 cm   17 w 1 d
AC:  12.1 cm  17 w 6 d
FL:    2.4 cm  17 w 3 d 

MEAN GA:  17 w 4 d  US EDC:  09/09/05

FETAL ANATOMY
Lateral Ventricles:    Visualized 
Thalami/CSP:      Visualized 
Posterior Fossa:  Visualized 
Nuchal Region:    Visualized 
Spine:      Limited
4 Chamber Heart on Left:      Not visualized 
Stomach on Left:      Visualized 
3 Vessel Cord:    Not visualized 
Cord Insertion site:    Not visualized 
Kidneys:  Visualized 
Bladder:  Visualized 
Extremities:      Not visualized 

Evaluation limited by:  Maternal habitus

MATERNAL UTERINE AND ADNEXAL FINDINGS
Cervix: 3.2 cm Transabdominally
IMPRESSION: 1.  Single intrauterine pregnancy demonstrating an estimated gestational age by ultrasound of 17 weeks and 4 days.  Fetal parameters correlate well with this composite estimated gestational age.
2.  A limited anatomic assessment was possible due to early gestational age combined with maternal body habitus limiting overall scan resolution.  The sacral spine, cardiac anatomy, cord anatomy, anterior abdominal wall, facial anatomy and distal extremities were not completely assessed.  Follow-up evaluation in 2 to 3 weeks would be recommended for hopeful improvement in overall anatomic visualization.

## 2007-09-29 ENCOUNTER — Telehealth (INDEPENDENT_AMBULATORY_CARE_PROVIDER_SITE_OTHER): Payer: Self-pay | Admitting: *Deleted

## 2007-09-29 ENCOUNTER — Emergency Department (HOSPITAL_COMMUNITY): Admission: EM | Admit: 2007-09-29 | Discharge: 2007-09-29 | Payer: Self-pay | Admitting: Emergency Medicine

## 2007-10-04 ENCOUNTER — Encounter: Payer: Self-pay | Admitting: *Deleted

## 2007-10-09 ENCOUNTER — Encounter (INDEPENDENT_AMBULATORY_CARE_PROVIDER_SITE_OTHER): Payer: Self-pay | Admitting: *Deleted

## 2007-10-11 ENCOUNTER — Telehealth: Payer: Self-pay | Admitting: *Deleted

## 2007-10-12 ENCOUNTER — Encounter: Payer: Self-pay | Admitting: *Deleted

## 2007-10-13 ENCOUNTER — Ambulatory Visit: Payer: Self-pay | Admitting: Family Medicine

## 2007-10-13 ENCOUNTER — Telehealth: Payer: Self-pay | Admitting: *Deleted

## 2007-10-13 ENCOUNTER — Encounter: Payer: Self-pay | Admitting: *Deleted

## 2007-10-18 ENCOUNTER — Encounter: Payer: Self-pay | Admitting: *Deleted

## 2007-11-06 ENCOUNTER — Telehealth: Payer: Self-pay | Admitting: *Deleted

## 2007-11-08 ENCOUNTER — Telehealth: Payer: Self-pay | Admitting: *Deleted

## 2007-11-09 ENCOUNTER — Encounter: Payer: Self-pay | Admitting: Family Medicine

## 2007-11-09 ENCOUNTER — Ambulatory Visit: Payer: Self-pay | Admitting: Family Medicine

## 2007-11-09 DIAGNOSIS — K219 Gastro-esophageal reflux disease without esophagitis: Secondary | ICD-10-CM | POA: Insufficient documentation

## 2007-11-09 DIAGNOSIS — R0789 Other chest pain: Secondary | ICD-10-CM | POA: Insufficient documentation

## 2007-11-14 ENCOUNTER — Encounter: Payer: Self-pay | Admitting: Family Medicine

## 2007-11-14 ENCOUNTER — Telehealth: Payer: Self-pay | Admitting: Family Medicine

## 2007-11-14 LAB — CONVERTED CEMR LAB
ALT: 14 units/L (ref 0–35)
AST: 13 units/L (ref 0–37)
Albumin: 4.5 g/dL (ref 3.5–5.2)
Alkaline Phosphatase: 95 units/L (ref 39–117)
Calcium: 11 mg/dL — ABNORMAL HIGH (ref 8.4–10.5)
Glucose, Bld: 116 mg/dL — ABNORMAL HIGH (ref 70–99)
LDL Cholesterol: 165 mg/dL — ABNORMAL HIGH (ref 0–99)
Potassium: 4.1 meq/L (ref 3.5–5.3)
Total Bilirubin: 0.5 mg/dL (ref 0.3–1.2)
Total CHOL/HDL Ratio: 5.9
VLDL: 17 mg/dL (ref 0–40)

## 2007-11-18 ENCOUNTER — Emergency Department (HOSPITAL_COMMUNITY): Admission: EM | Admit: 2007-11-18 | Discharge: 2007-11-18 | Payer: Self-pay | Admitting: Emergency Medicine

## 2007-11-28 IMAGING — US US OB FOLLOW-UP
1 series · 13 of 28 positions shown · non-contrast
Comparison: none

CLINICAL DATA: Assess growth.

[Series 1: us ob follow-up · 0.24mm/px · 13 of 38 slices shown]
[im 2/38]
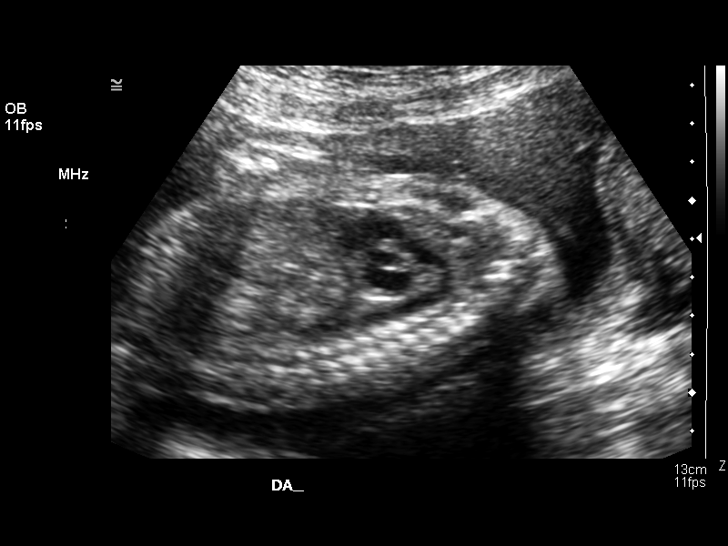
[im 5/38]
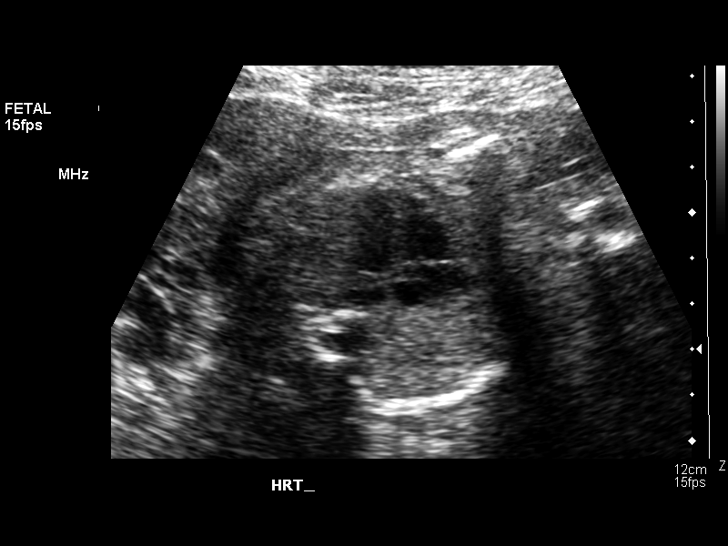
[im 7/38]
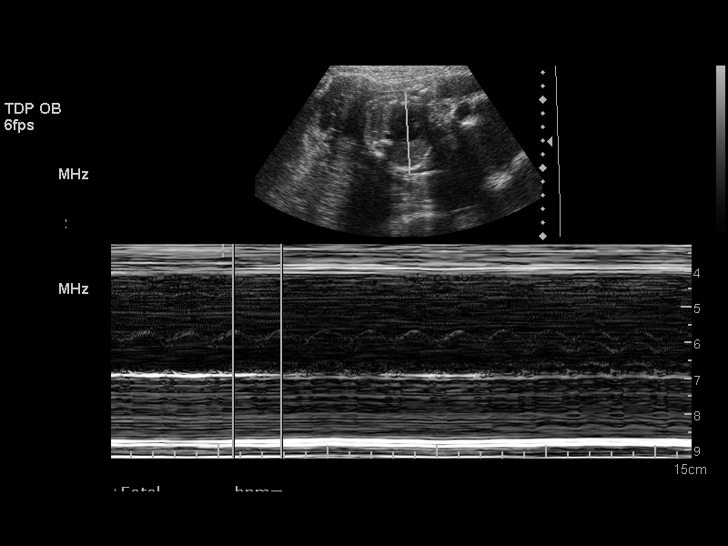
[im 10/38]
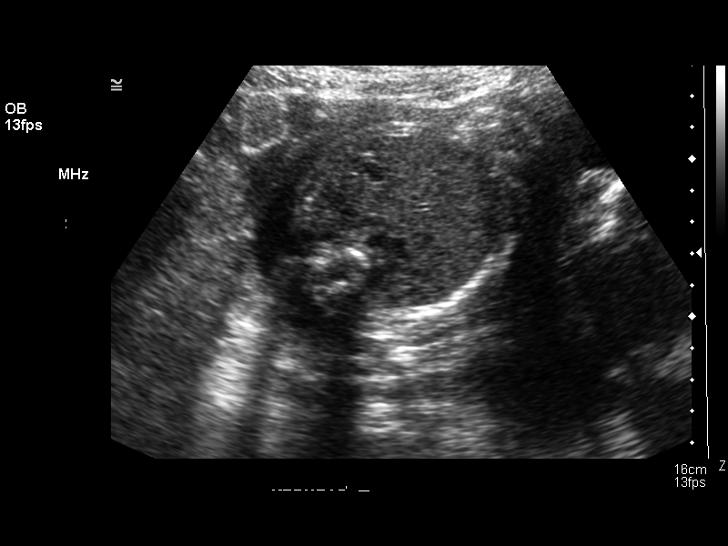
[im 13/38]
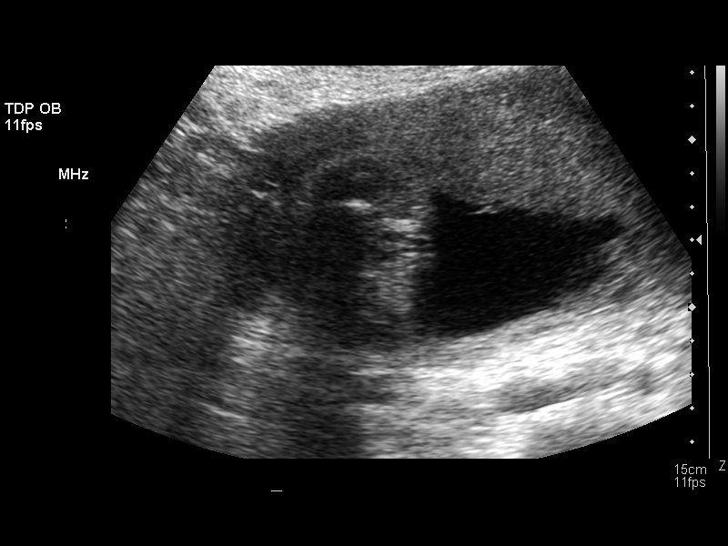
[im 16/38]
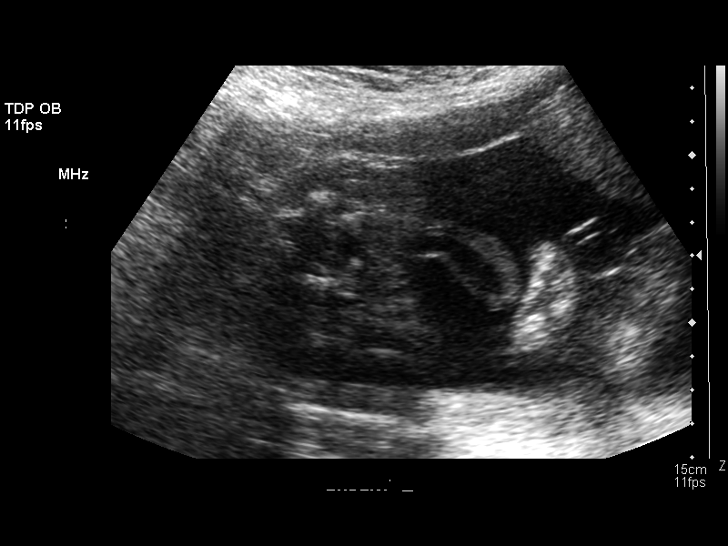
[im 20/38]
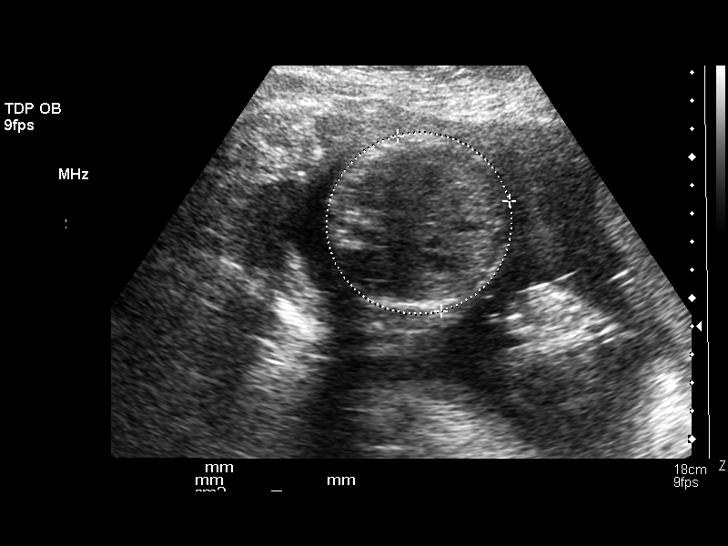
[im 22/38]
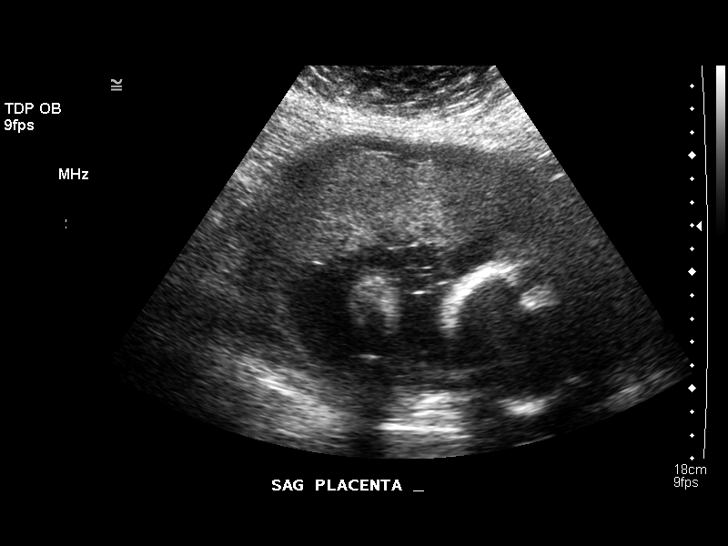
[im 25/38]
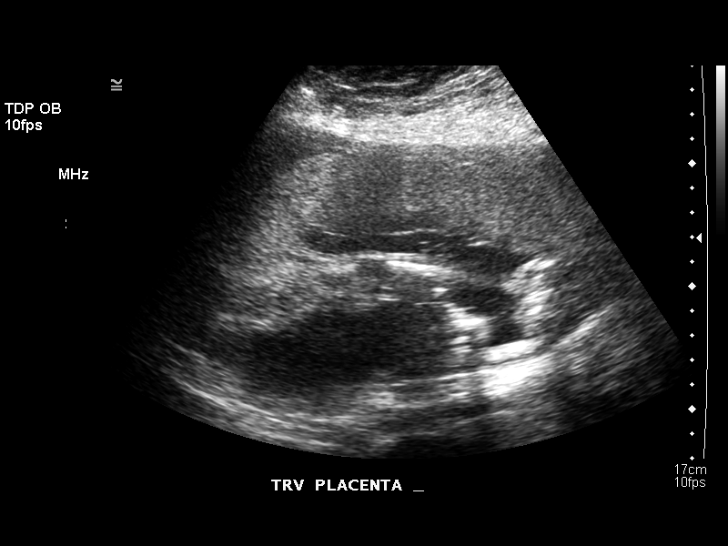
[im 28/38]
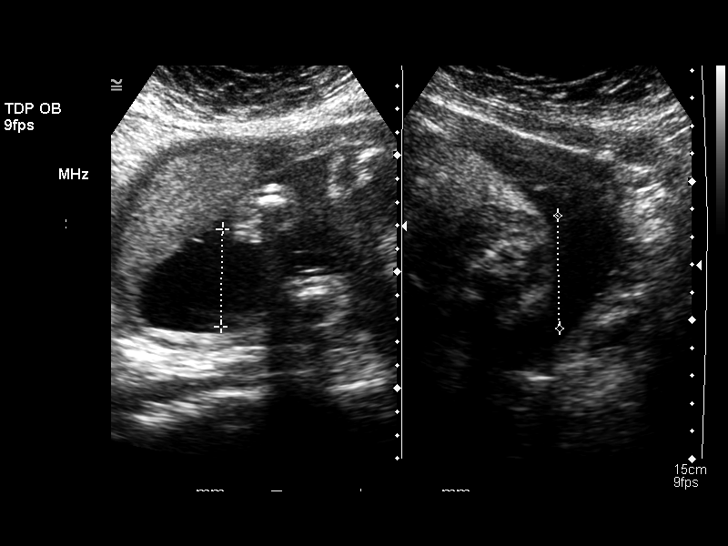
[im 31/38]
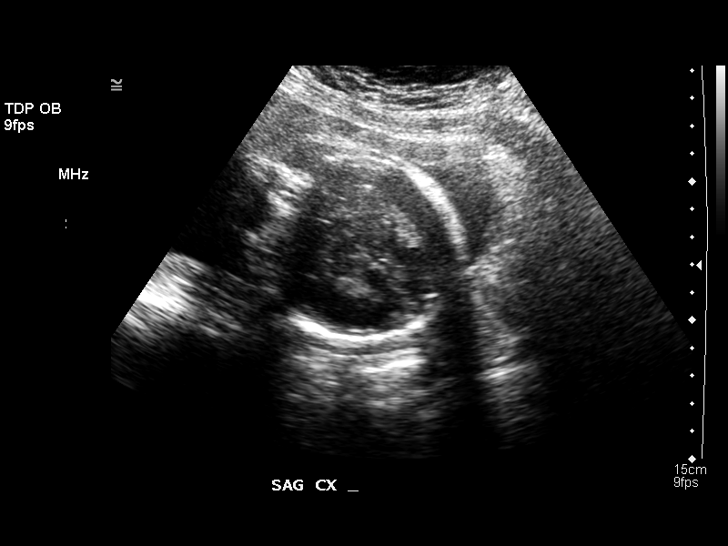
[im 33/38]
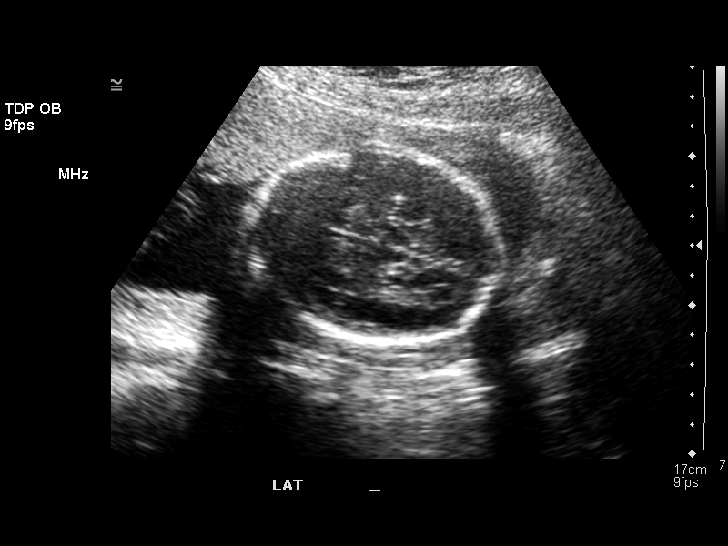
[im 36/38]
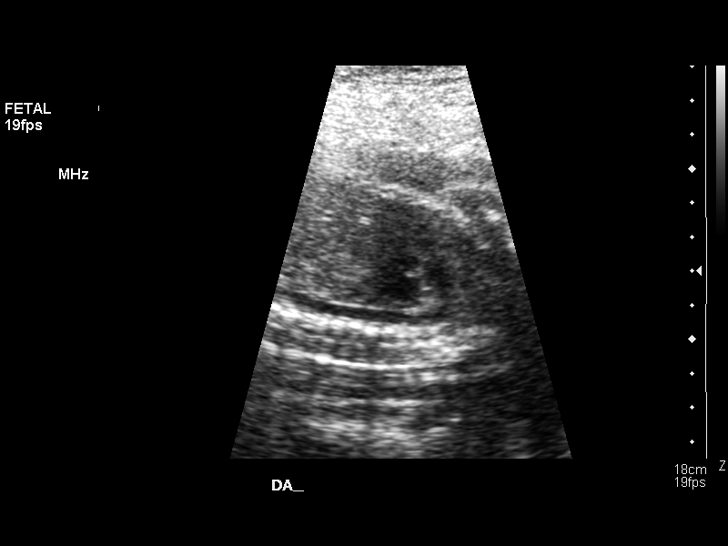

[13 of 28 positions shown; findings below may reference images not displayed]

OBSTETRICAL ULTRASOUND RE-EVALUATION:
 Number of Fetuses: 1
 Heart Rate:  136
 Movement:  Yes
 Breathing:  No
 Presentation:  Cephalic
 Placental Location:  Posterior
 Grade:  II
 Previa:  No
 Amniotic Fluid (subjective):  Normal
 Amniotic Fluid (objective):  14.9 cm AFI (5th -95th%ile = 9.5 – 22.6 cm for 27 wks)

 FETAL BIOMETRY
 BPD:  6.6 cm   26 w 4 d
 HC:  25.0 cm   26 w 6 d
 AC:  20.4 cm   25 w 0 d
 FL:   5.3 cm   28 w 0 d

 Mean GA:  26 w 4 d  US EDC:  09/09/05
 Assigned GA:  26 w 4 d  Assigned EDC:  09/09/05

 EFW:  828 g (H) 50th – 75th%ile (875 – 1411 g) For 27 wks

 FETAL ANATOMY
 Lateral Ventricles:  Visualized 
 Thalami/CSP:  Previously seen 
 Posterior Fossa:  Previously seen 
 Nuchal Region:  Previously seen 
 Spine:  Not visualized 
 4 Chamber Heart on Left:  Visualized 
 Stomach on Left:  Visualized 
 3 Vessel Cord:  Not visualized 
 Cord Insertion Site:  Not visualized 
 Kidneys:  Visualized 
 Bladder:  Visualized 
 Extremities:  Previously seen 

 Evaluation limited by:  Maternal habitus

 MATERNAL UTERINE AND ADNEXAL FINDINGS
 Cervix:  3.2 cm Transabdominally
IMPRESSION: 1.  Single intrauterine pregnancy demonstrating an estimated gestational age by ultrasound of 26 weeks and 4 days.  Correlation with expected estimated gestational age by initial ultrasound of 26 weeks and 4 days correlates with appropriate growth.  
 2.  No late developing fetal anatomic abnormalities are identified associated with the lateral ventricles, four chamber heart, stomach, kidneys, or bladder.  
 3.  Subjectively and quantitatively normal amniotic fluid volume and normal cervical length.

## 2007-11-29 ENCOUNTER — Telehealth: Payer: Self-pay | Admitting: Family Medicine

## 2007-12-06 ENCOUNTER — Telehealth (INDEPENDENT_AMBULATORY_CARE_PROVIDER_SITE_OTHER): Payer: Self-pay | Admitting: *Deleted

## 2007-12-08 ENCOUNTER — Ambulatory Visit: Payer: Self-pay | Admitting: Family Medicine

## 2007-12-09 ENCOUNTER — Emergency Department (HOSPITAL_COMMUNITY): Admission: EM | Admit: 2007-12-09 | Discharge: 2007-12-09 | Payer: Self-pay | Admitting: Family Medicine

## 2007-12-13 ENCOUNTER — Telehealth (INDEPENDENT_AMBULATORY_CARE_PROVIDER_SITE_OTHER): Payer: Self-pay | Admitting: *Deleted

## 2007-12-13 ENCOUNTER — Ambulatory Visit: Payer: Self-pay | Admitting: Family Medicine

## 2007-12-13 DIAGNOSIS — G43909 Migraine, unspecified, not intractable, without status migrainosus: Secondary | ICD-10-CM

## 2007-12-13 HISTORY — DX: Migraine, unspecified, not intractable, without status migrainosus: G43.909

## 2007-12-16 ENCOUNTER — Emergency Department (HOSPITAL_COMMUNITY): Admission: EM | Admit: 2007-12-16 | Discharge: 2007-12-17 | Payer: Self-pay | Admitting: Emergency Medicine

## 2007-12-16 ENCOUNTER — Emergency Department (HOSPITAL_COMMUNITY): Admission: EM | Admit: 2007-12-16 | Discharge: 2007-12-16 | Payer: Self-pay | Admitting: Emergency Medicine

## 2007-12-17 ENCOUNTER — Emergency Department (HOSPITAL_COMMUNITY): Admission: EM | Admit: 2007-12-17 | Discharge: 2007-12-18 | Payer: Self-pay | Admitting: Emergency Medicine

## 2007-12-17 ENCOUNTER — Emergency Department (HOSPITAL_COMMUNITY): Admission: EM | Admit: 2007-12-17 | Discharge: 2007-12-17 | Payer: Self-pay | Admitting: Emergency Medicine

## 2007-12-18 ENCOUNTER — Telehealth: Payer: Self-pay | Admitting: Family Medicine

## 2007-12-18 ENCOUNTER — Ambulatory Visit: Payer: Self-pay | Admitting: Family Medicine

## 2007-12-18 ENCOUNTER — Encounter: Payer: Self-pay | Admitting: *Deleted

## 2007-12-19 ENCOUNTER — Emergency Department (HOSPITAL_COMMUNITY): Admission: EM | Admit: 2007-12-19 | Discharge: 2007-12-19 | Payer: Self-pay | Admitting: Emergency Medicine

## 2007-12-20 ENCOUNTER — Ambulatory Visit: Payer: Self-pay | Admitting: Family Medicine

## 2007-12-20 ENCOUNTER — Ambulatory Visit (HOSPITAL_COMMUNITY): Admission: RE | Admit: 2007-12-20 | Discharge: 2007-12-20 | Payer: Self-pay | Admitting: Family Medicine

## 2007-12-21 ENCOUNTER — Telehealth (INDEPENDENT_AMBULATORY_CARE_PROVIDER_SITE_OTHER): Payer: Self-pay | Admitting: *Deleted

## 2007-12-29 ENCOUNTER — Encounter: Payer: Self-pay | Admitting: Family Medicine

## 2008-01-31 IMAGING — CR DG KNEE COMPLETE 4+V*R*
4 series · 4 of 4 positions shown · non-contrast
Comparison: none

CLINICAL DATA: The patient fell and has painful and swollen right knee.
 RIGHT KNEE - 4 VIEW:

[view not recorded (1 of 4)]
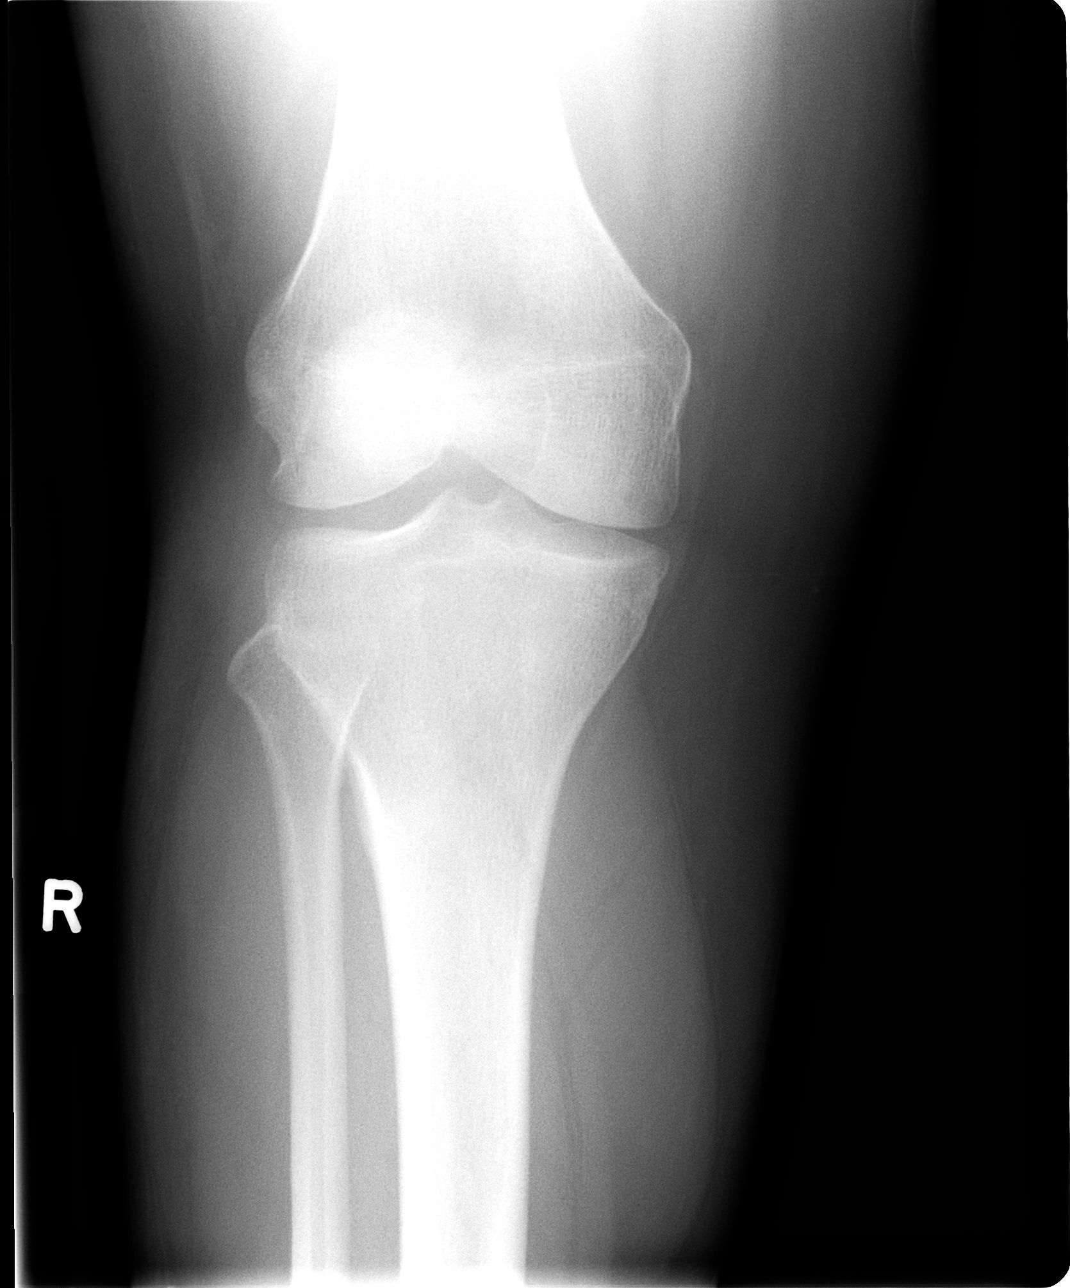

[view not recorded (2 of 4)]
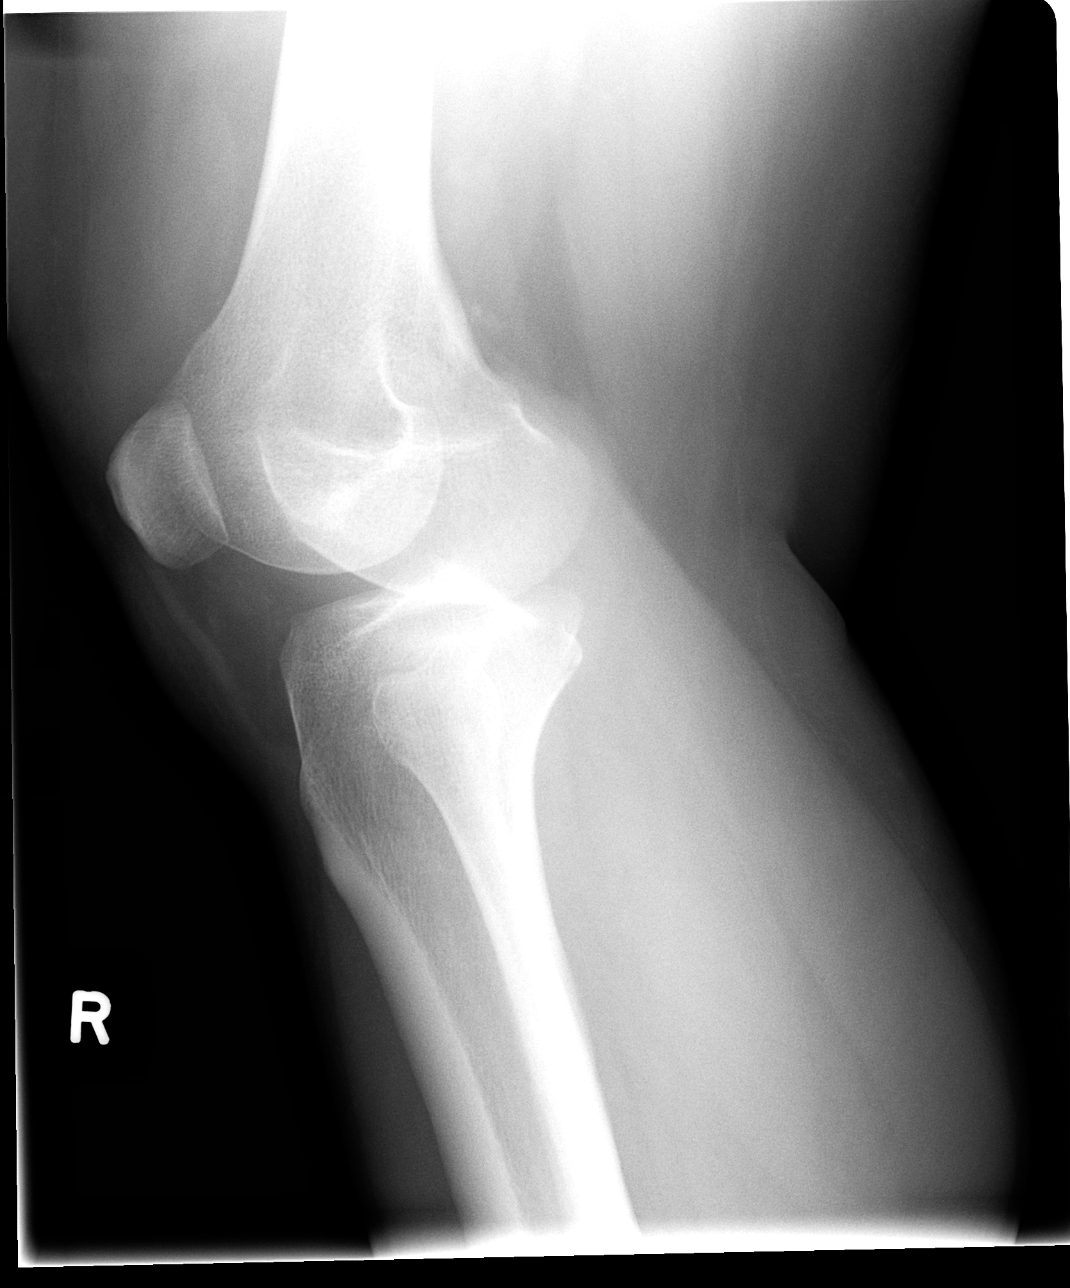

[view not recorded (3 of 4)]
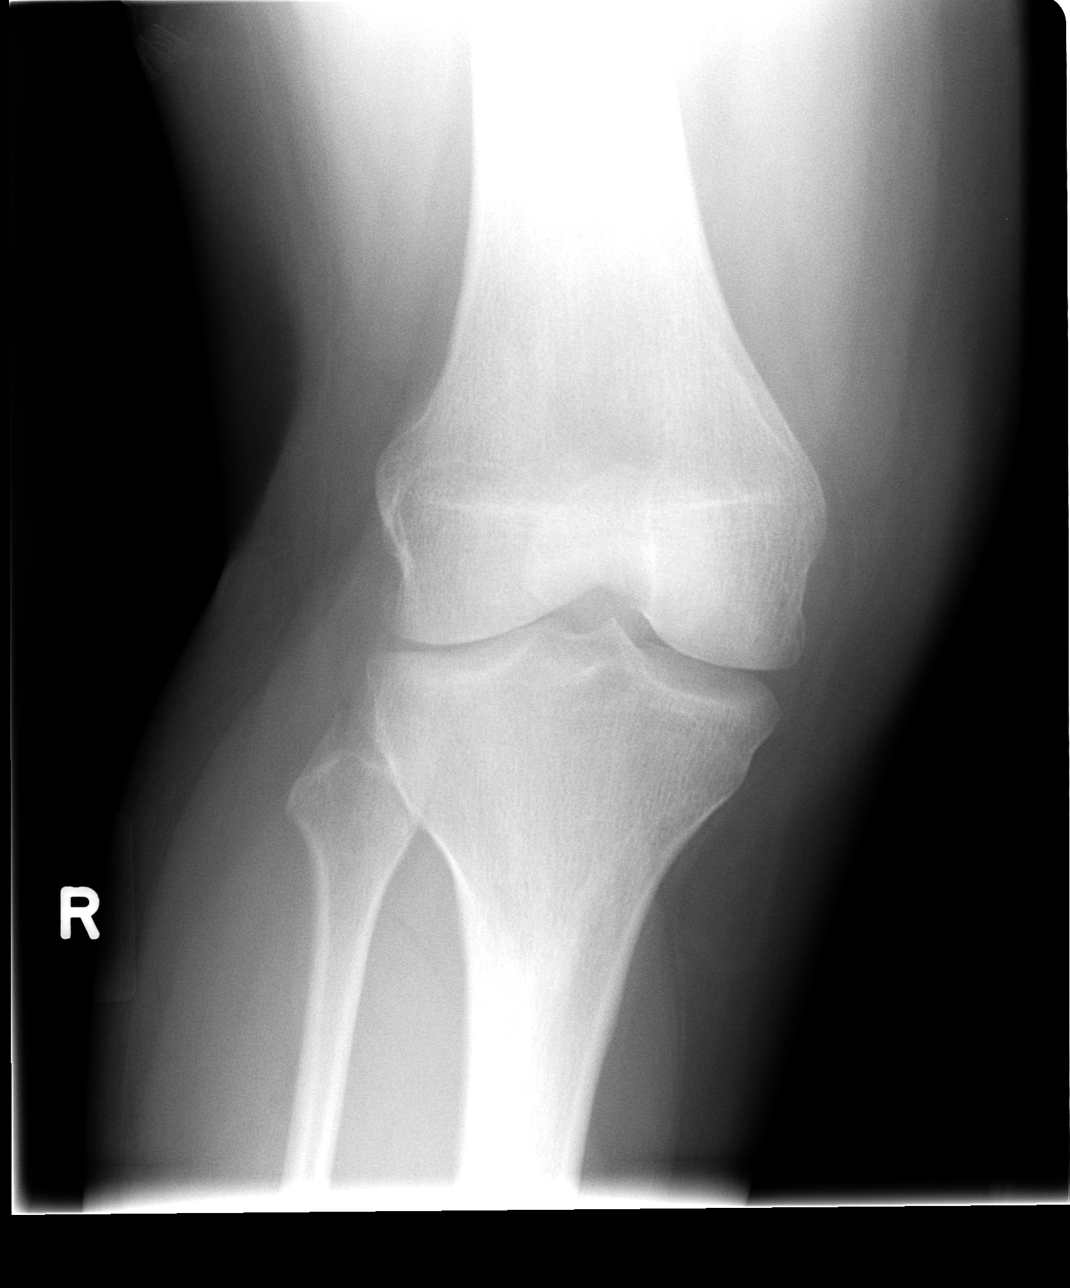

[view not recorded (4 of 4)]
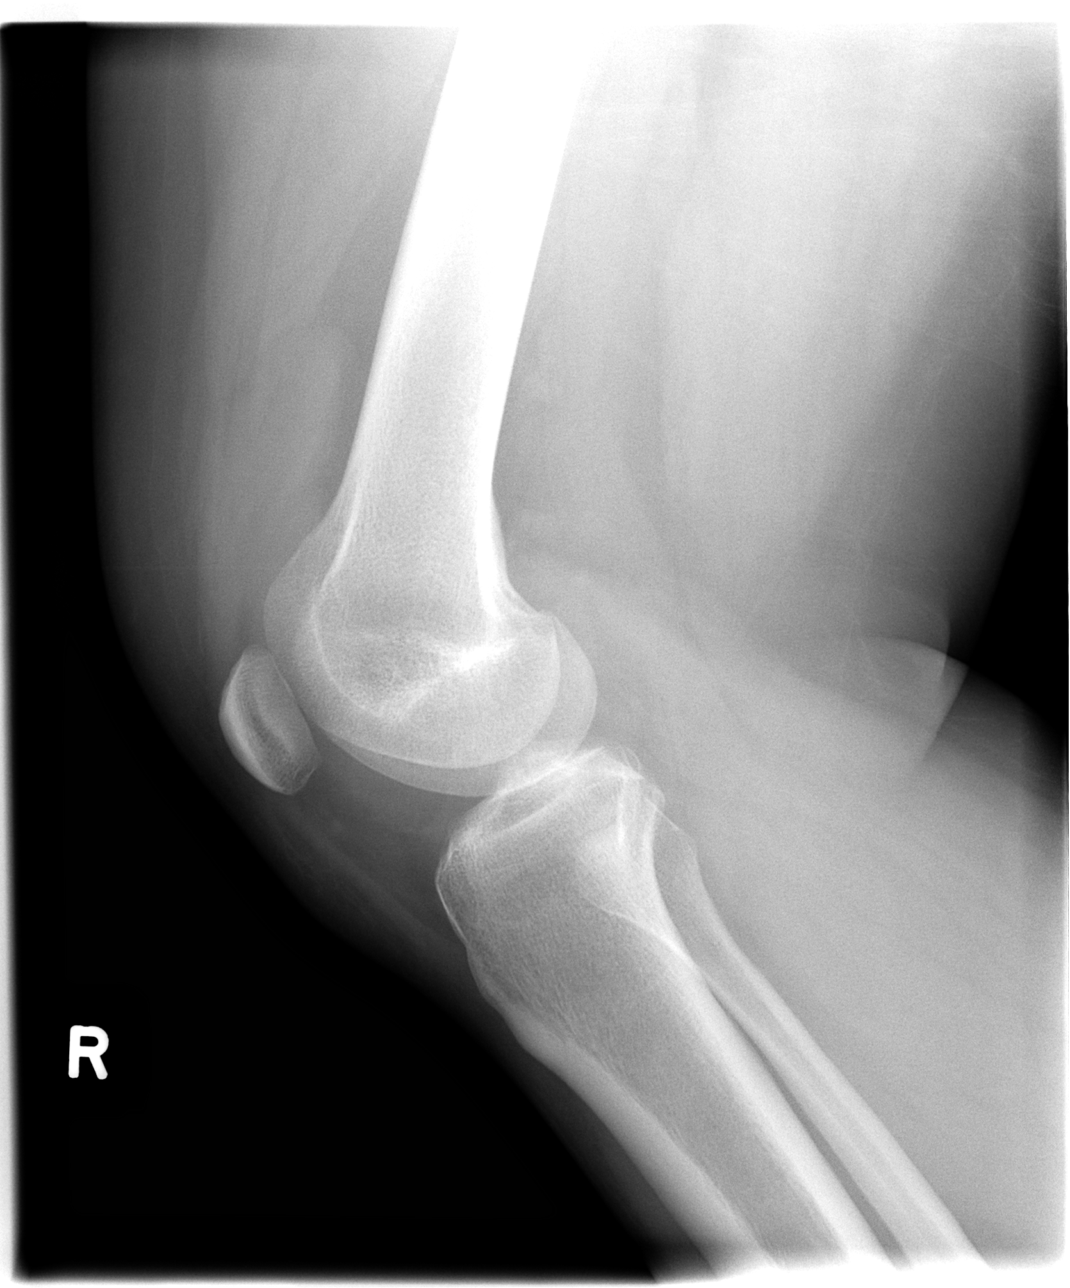

[4 of 4 positions shown; findings below may reference images not displayed]

FINDINGS: No fracture or subluxation.  Knee joint effusion.
IMPRESSION: No acute bony abnormality.  Knee joint effusion.

## 2008-03-08 ENCOUNTER — Encounter: Payer: Self-pay | Admitting: *Deleted

## 2008-04-14 ENCOUNTER — Emergency Department (HOSPITAL_COMMUNITY): Admission: EM | Admit: 2008-04-14 | Discharge: 2008-04-14 | Payer: Self-pay | Admitting: Emergency Medicine

## 2008-06-14 IMAGING — CR DG CHEST 2V
2 series · 2 of 2 positions shown · non-contrast
Comparison: None.

CLINICAL DATA: 25-year-old with chest pain. 
 CHEST - 2 VIEW:

[w chest pa]
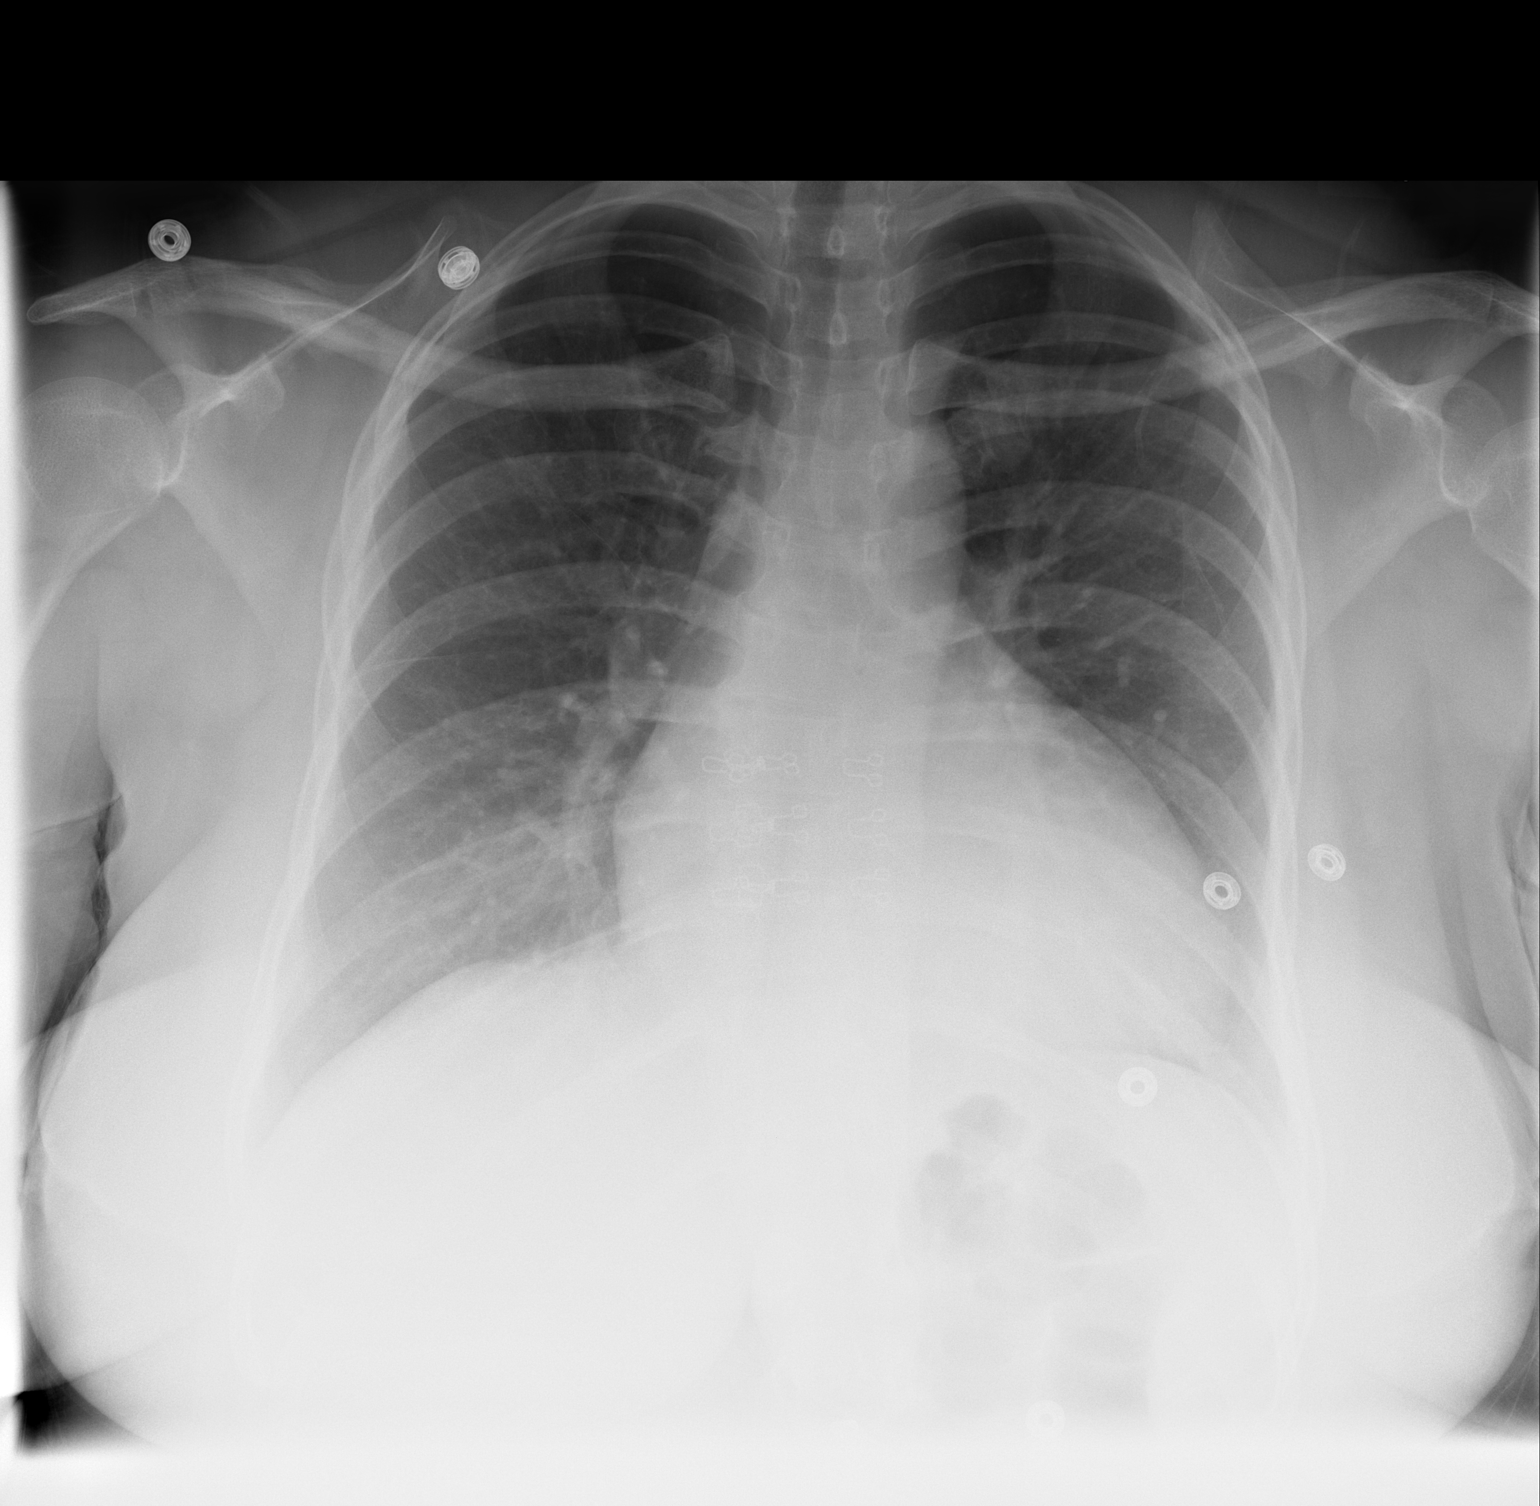

[w chest lat]
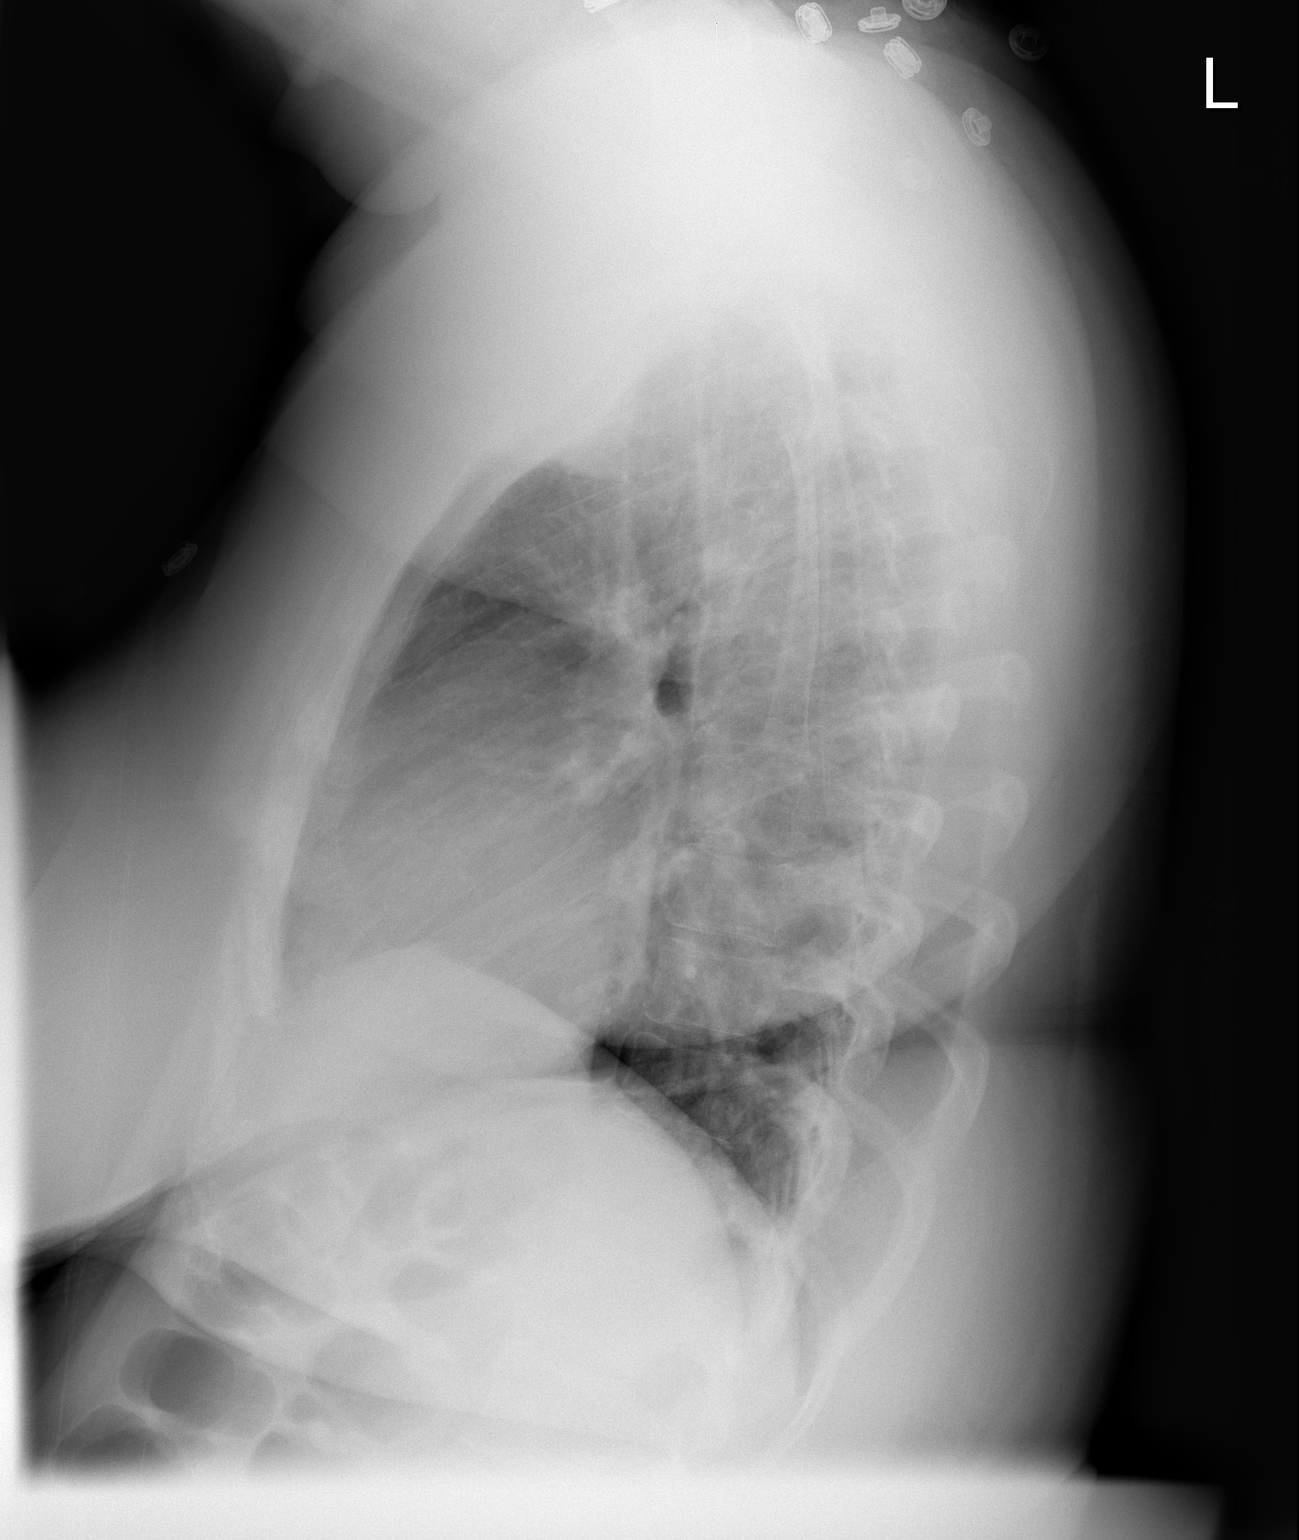

[2 of 2 positions shown; findings below may reference images not displayed]

FINDINGS: Heart is upper limits of normal.  Mediastinal and hilar contours are within normal limits.  Lung bases are not well seen due to the patient?s size.  Streaky bibasilar atelectasis and mild vascular crowding.  No effusions or edema.
IMPRESSION: 1.  Streaky bibasilar atelectasis and vascular crowding. 
 2.  Borderline heart size.

## 2008-09-02 ENCOUNTER — Telehealth: Payer: Self-pay | Admitting: *Deleted

## 2008-10-23 ENCOUNTER — Telehealth (INDEPENDENT_AMBULATORY_CARE_PROVIDER_SITE_OTHER): Payer: Self-pay | Admitting: *Deleted

## 2008-11-28 ENCOUNTER — Encounter: Payer: Self-pay | Admitting: Family Medicine

## 2009-01-09 ENCOUNTER — Encounter: Payer: Self-pay | Admitting: Family Medicine

## 2009-01-09 ENCOUNTER — Ambulatory Visit: Payer: Self-pay | Admitting: Family Medicine

## 2009-01-09 ENCOUNTER — Encounter: Payer: Self-pay | Admitting: *Deleted

## 2009-01-09 LAB — CONVERTED CEMR LAB
Basophils Relative: 0 % (ref 0–1)
Hemoglobin: 11.2 g/dL — ABNORMAL LOW (ref 12.0–15.0)
Hepatitis B Surface Ag: NEGATIVE
Lymphocytes Relative: 14 % (ref 12–46)
Lymphs Abs: 1.3 10*3/uL (ref 0.7–4.0)
Monocytes Absolute: 0.9 10*3/uL (ref 0.1–1.0)
Monocytes Relative: 9 % (ref 3–12)
Neutro Abs: 7.6 10*3/uL (ref 1.7–7.7)
Neutrophils Relative %: 77 % (ref 43–77)
RBC: 4.08 M/uL (ref 3.87–5.11)
Rh Type: POSITIVE
WBC: 9.9 10*3/uL (ref 4.0–10.5)

## 2009-01-10 ENCOUNTER — Encounter: Payer: Self-pay | Admitting: Family Medicine

## 2009-01-10 ENCOUNTER — Ambulatory Visit (HOSPITAL_COMMUNITY): Admission: RE | Admit: 2009-01-10 | Discharge: 2009-01-10 | Payer: Self-pay | Admitting: Family Medicine

## 2009-01-13 ENCOUNTER — Telehealth: Payer: Self-pay | Admitting: *Deleted

## 2009-01-20 ENCOUNTER — Telehealth: Payer: Self-pay | Admitting: *Deleted

## 2009-02-13 ENCOUNTER — Ambulatory Visit: Payer: Self-pay | Admitting: Family Medicine

## 2009-02-13 ENCOUNTER — Encounter: Payer: Self-pay | Admitting: Family Medicine

## 2009-02-13 DIAGNOSIS — Z8742 Personal history of other diseases of the female genital tract: Secondary | ICD-10-CM | POA: Insufficient documentation

## 2009-02-13 LAB — CONVERTED CEMR LAB

## 2009-02-14 ENCOUNTER — Telehealth: Payer: Self-pay | Admitting: *Deleted

## 2009-02-17 ENCOUNTER — Ambulatory Visit: Payer: Self-pay | Admitting: Obstetrics & Gynecology

## 2009-02-17 LAB — CONVERTED CEMR LAB: Rh Type: POSITIVE

## 2009-02-19 ENCOUNTER — Encounter: Payer: Self-pay | Admitting: Family Medicine

## 2009-02-21 ENCOUNTER — Ambulatory Visit (HOSPITAL_COMMUNITY): Admission: RE | Admit: 2009-02-21 | Discharge: 2009-02-21 | Payer: Self-pay | Admitting: Obstetrics & Gynecology

## 2009-02-25 ENCOUNTER — Encounter: Payer: Self-pay | Admitting: Family Medicine

## 2009-02-28 ENCOUNTER — Telehealth: Payer: Self-pay | Admitting: *Deleted

## 2009-02-28 ENCOUNTER — Encounter: Payer: Self-pay | Admitting: Family Medicine

## 2009-03-10 ENCOUNTER — Ambulatory Visit (HOSPITAL_COMMUNITY): Admission: RE | Admit: 2009-03-10 | Discharge: 2009-03-10 | Payer: Self-pay | Admitting: Obstetrics & Gynecology

## 2009-03-10 ENCOUNTER — Ambulatory Visit: Payer: Self-pay | Admitting: Obstetrics & Gynecology

## 2009-03-10 LAB — CONVERTED CEMR LAB
MCHC: 31.9 g/dL (ref 30.0–36.0)
MCV: 83.8 fL (ref 78.0–100.0)
Platelets: 281 10*3/uL (ref 150–400)
RBC: 4.08 M/uL (ref 3.87–5.11)
WBC: 8.5 10*3/uL (ref 4.0–10.5)

## 2009-03-14 ENCOUNTER — Inpatient Hospital Stay (HOSPITAL_COMMUNITY): Admission: AD | Admit: 2009-03-14 | Discharge: 2009-03-14 | Payer: Self-pay | Admitting: Obstetrics and Gynecology

## 2009-03-24 ENCOUNTER — Ambulatory Visit: Payer: Self-pay | Admitting: Obstetrics & Gynecology

## 2009-03-24 LAB — CONVERTED CEMR LAB: Pap Smear: NEGATIVE

## 2009-04-11 ENCOUNTER — Ambulatory Visit: Payer: Self-pay | Admitting: Advanced Practice Midwife

## 2009-04-11 ENCOUNTER — Inpatient Hospital Stay (HOSPITAL_COMMUNITY): Admission: AD | Admit: 2009-04-11 | Discharge: 2009-04-12 | Payer: Self-pay | Admitting: Obstetrics & Gynecology

## 2009-04-28 ENCOUNTER — Ambulatory Visit (HOSPITAL_COMMUNITY): Admission: RE | Admit: 2009-04-28 | Discharge: 2009-04-28 | Payer: Self-pay | Admitting: Obstetrics and Gynecology

## 2009-04-28 ENCOUNTER — Ambulatory Visit: Payer: Self-pay | Admitting: Obstetrics and Gynecology

## 2009-04-28 ENCOUNTER — Encounter: Payer: Self-pay | Admitting: Obstetrics & Gynecology

## 2009-04-28 LAB — CONVERTED CEMR LAB: Chlamydia, DNA Probe: NEGATIVE

## 2009-04-29 ENCOUNTER — Encounter: Payer: Self-pay | Admitting: Obstetrics & Gynecology

## 2009-05-01 ENCOUNTER — Ambulatory Visit: Payer: Self-pay | Admitting: Obstetrics and Gynecology

## 2009-05-05 ENCOUNTER — Ambulatory Visit: Payer: Self-pay | Admitting: Family

## 2009-05-05 ENCOUNTER — Inpatient Hospital Stay (HOSPITAL_COMMUNITY): Admission: AD | Admit: 2009-05-05 | Discharge: 2009-05-08 | Payer: Self-pay | Admitting: Family Medicine

## 2009-05-05 ENCOUNTER — Ambulatory Visit: Payer: Self-pay | Admitting: Obstetrics & Gynecology

## 2009-05-20 ENCOUNTER — Ambulatory Visit: Payer: Self-pay | Admitting: Family Medicine

## 2009-07-17 ENCOUNTER — Ambulatory Visit: Payer: Self-pay | Admitting: Family Medicine

## 2009-07-18 IMAGING — CR DG CHEST 2V
2 series · 2 of 2 positions shown · non-contrast
Comparison: 12/23/05.

CLINICAL DATA: Chest pain. 
 CHEST ? 2 VIEW:

[w chest pa]
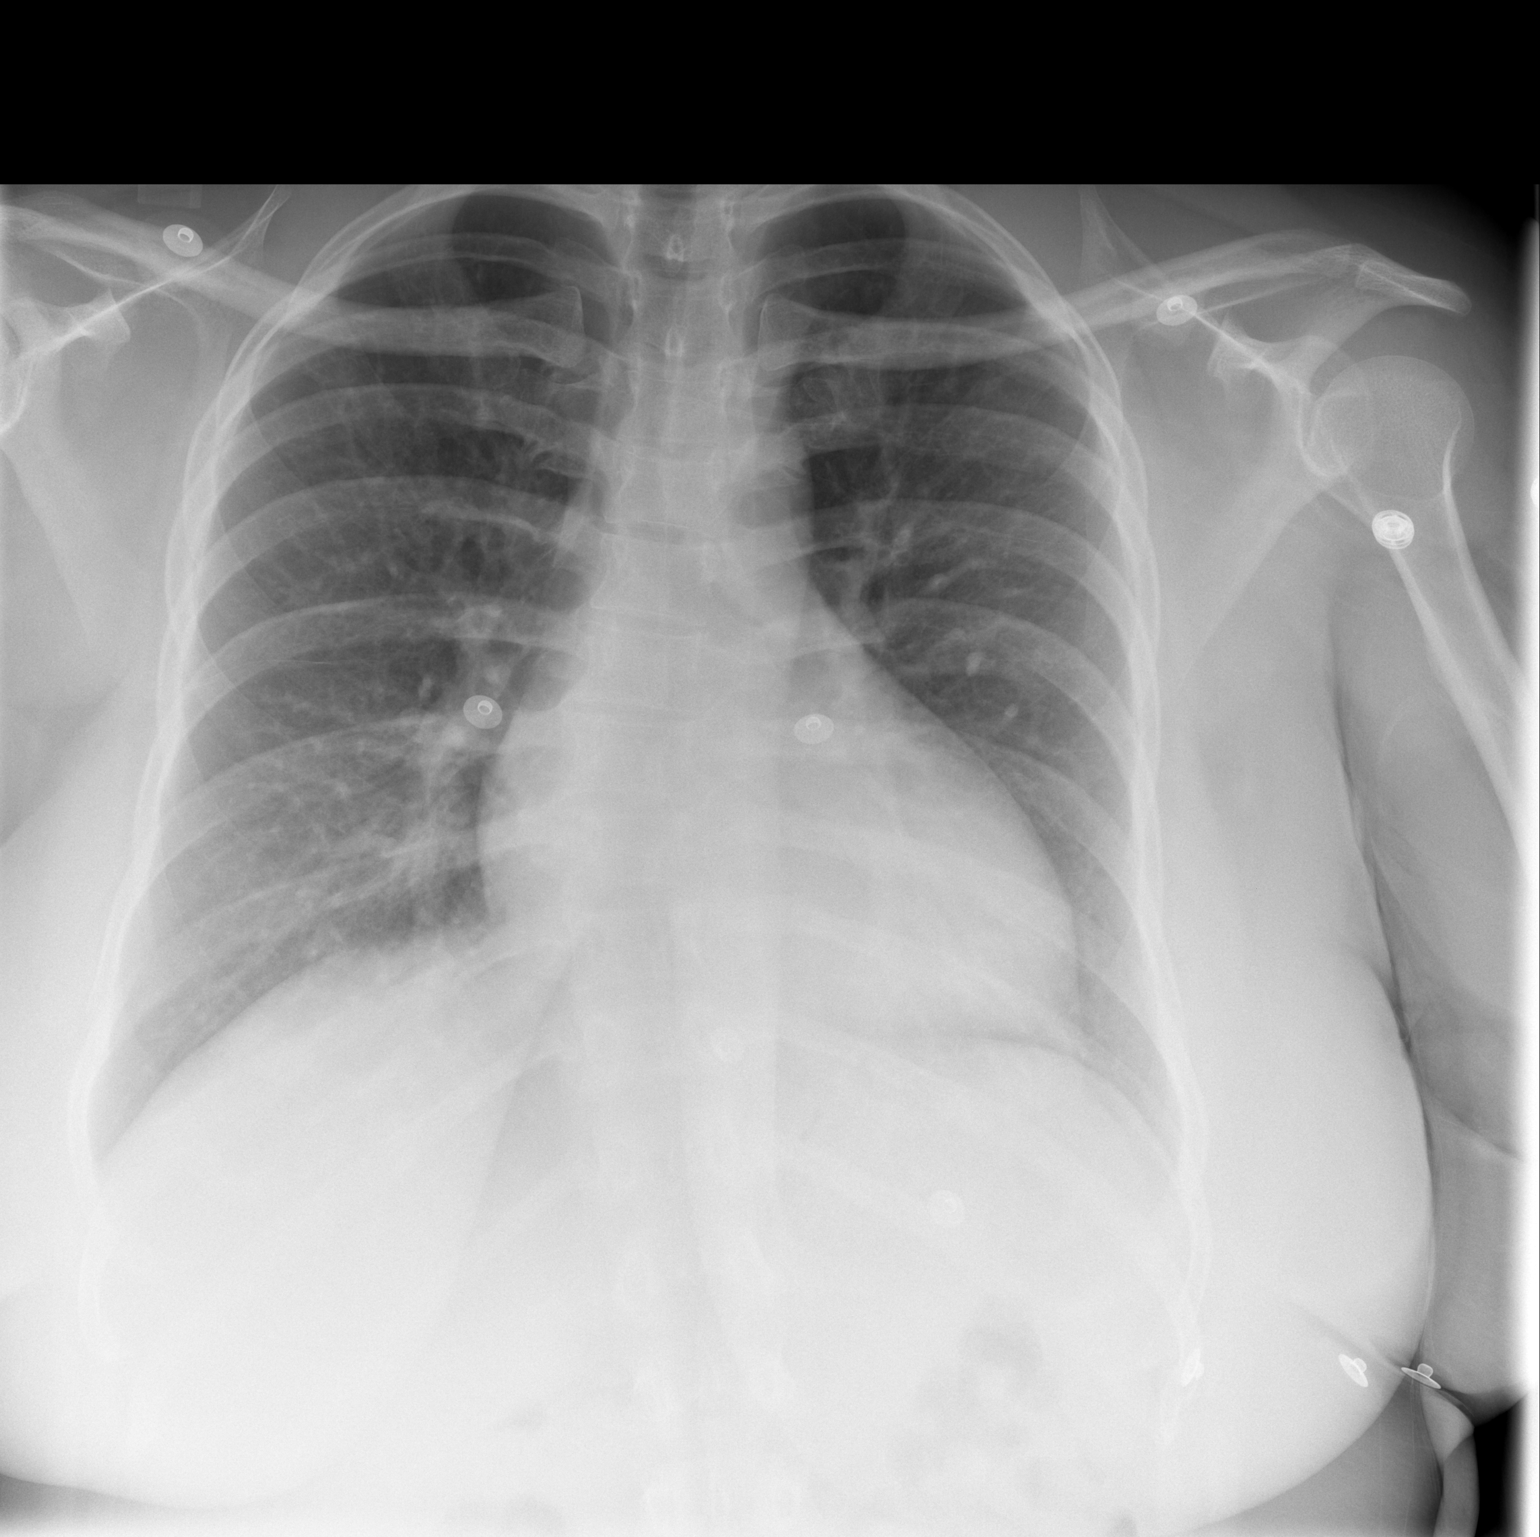

[w chest lat]
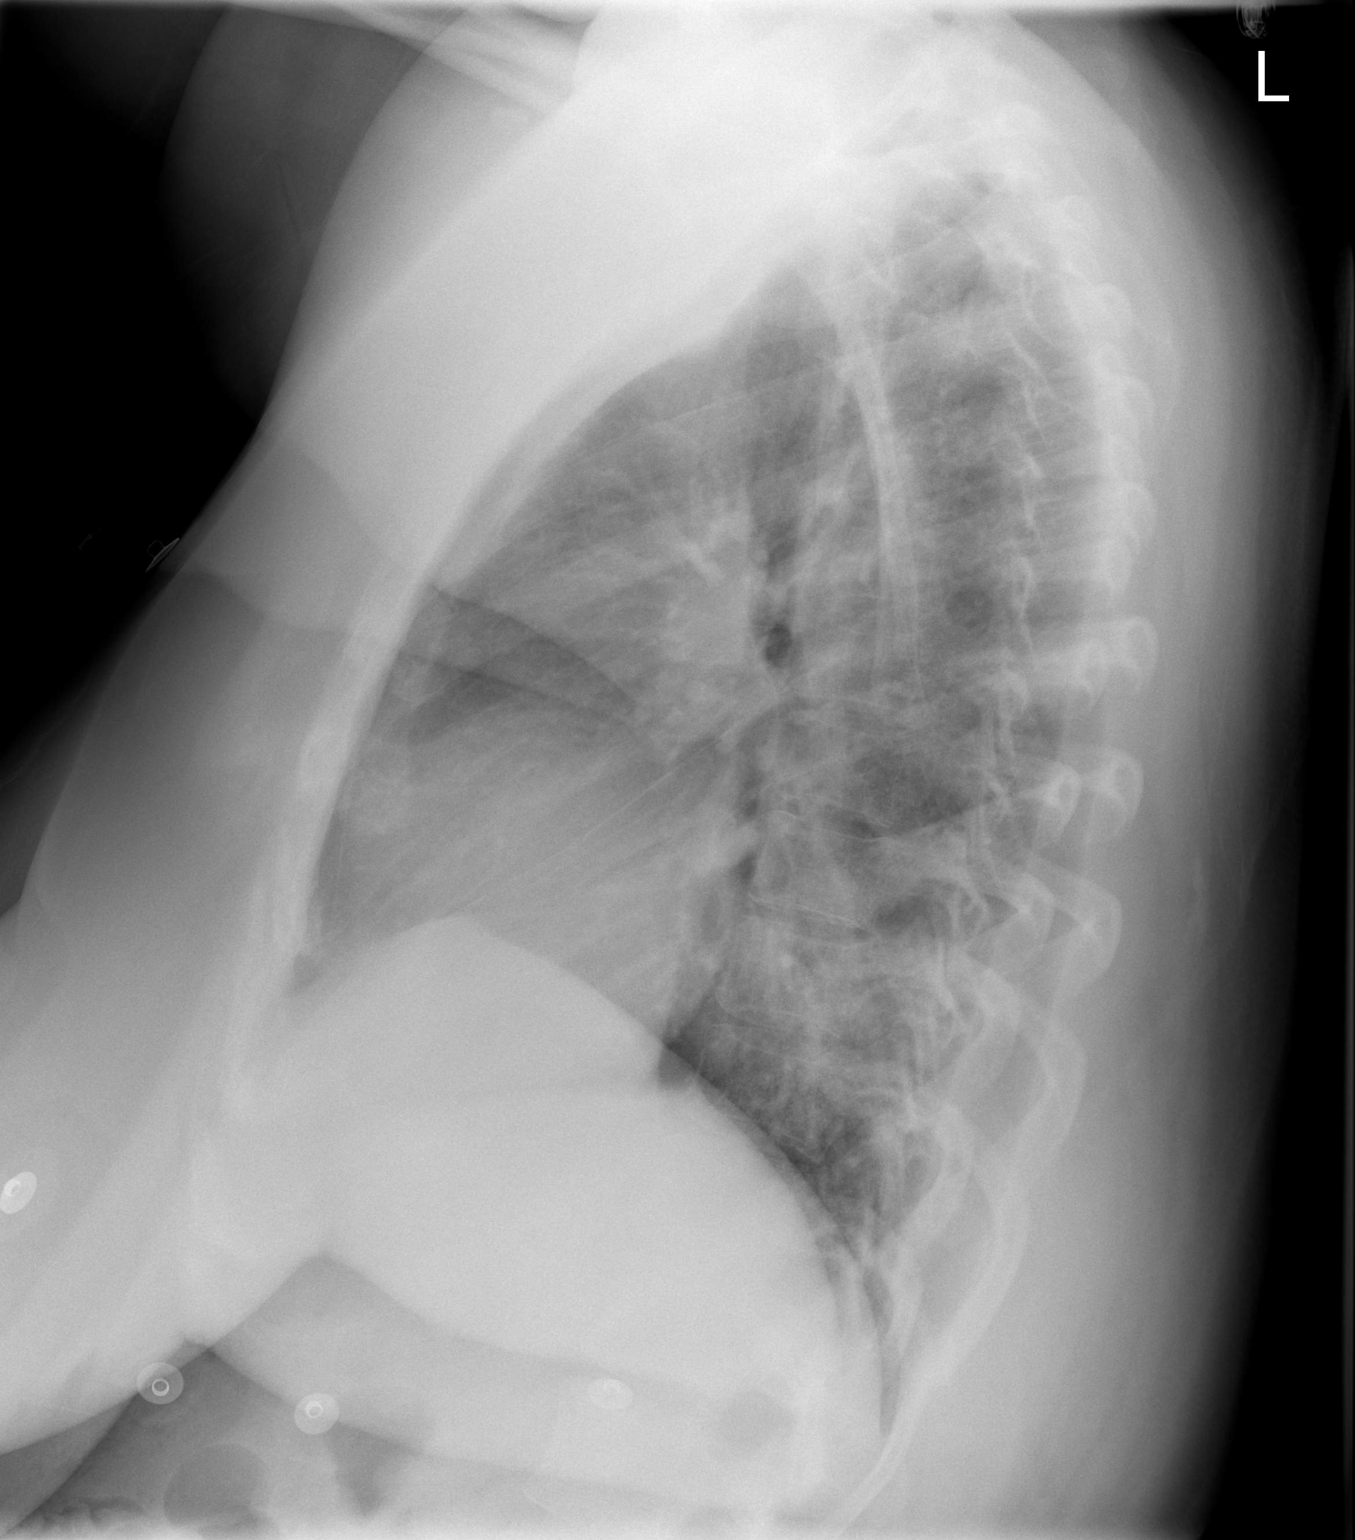

[2 of 2 positions shown; findings below may reference images not displayed]

FINDINGS: Heart size and mediastinal contours are within normal limits.  Both lungs are clear.  There is no evidence of pleural effusion.  No mass or adenopathy identified.
IMPRESSION: No active disease.

## 2009-08-21 ENCOUNTER — Telehealth: Payer: Self-pay | Admitting: Family Medicine

## 2009-08-22 ENCOUNTER — Ambulatory Visit: Payer: Self-pay | Admitting: Family Medicine

## 2009-09-11 ENCOUNTER — Ambulatory Visit: Payer: Self-pay | Admitting: Family Medicine

## 2009-09-11 DIAGNOSIS — L299 Pruritus, unspecified: Secondary | ICD-10-CM | POA: Insufficient documentation

## 2009-09-17 ENCOUNTER — Telehealth: Payer: Self-pay | Admitting: Family Medicine

## 2009-10-08 ENCOUNTER — Emergency Department (HOSPITAL_COMMUNITY): Admission: EM | Admit: 2009-10-08 | Discharge: 2009-10-08 | Payer: Self-pay | Admitting: Family Medicine

## 2009-12-03 ENCOUNTER — Telehealth: Payer: Self-pay | Admitting: *Deleted

## 2010-01-21 ENCOUNTER — Encounter: Payer: Self-pay | Admitting: Family Medicine

## 2010-02-11 ENCOUNTER — Ambulatory Visit
Admission: RE | Admit: 2010-02-11 | Discharge: 2010-02-11 | Payer: Self-pay | Source: Home / Self Care | Attending: Family Medicine | Admitting: Family Medicine

## 2010-02-11 ENCOUNTER — Ambulatory Visit: Admit: 2010-02-11 | Payer: Self-pay

## 2010-02-11 ENCOUNTER — Encounter: Payer: Self-pay | Admitting: Family Medicine

## 2010-02-11 DIAGNOSIS — N938 Other specified abnormal uterine and vaginal bleeding: Secondary | ICD-10-CM

## 2010-02-11 DIAGNOSIS — M549 Dorsalgia, unspecified: Secondary | ICD-10-CM | POA: Insufficient documentation

## 2010-02-11 DIAGNOSIS — N949 Unspecified condition associated with female genital organs and menstrual cycle: Secondary | ICD-10-CM | POA: Insufficient documentation

## 2010-02-11 DIAGNOSIS — J069 Acute upper respiratory infection, unspecified: Secondary | ICD-10-CM | POA: Insufficient documentation

## 2010-02-11 HISTORY — DX: Other specified abnormal uterine and vaginal bleeding: N93.8

## 2010-02-11 LAB — CONVERTED CEMR LAB
Beta hcg, urine, semiquantitative: NEGATIVE
Bilirubin Urine: NEGATIVE
Glucose, Urine, Semiquant: NEGATIVE
WBC, UA: >20 cells/hpf
pH: 6

## 2010-03-01 ENCOUNTER — Encounter: Payer: Self-pay | Admitting: *Deleted

## 2010-03-01 ENCOUNTER — Encounter: Payer: Self-pay | Admitting: Obstetrics and Gynecology

## 2010-03-02 ENCOUNTER — Ambulatory Visit
Admission: RE | Admit: 2010-03-02 | Discharge: 2010-03-02 | Payer: Self-pay | Source: Home / Self Care | Attending: Family Medicine | Admitting: Family Medicine

## 2010-03-02 DIAGNOSIS — K006 Disturbances in tooth eruption: Secondary | ICD-10-CM | POA: Insufficient documentation

## 2010-03-04 ENCOUNTER — Telehealth: Payer: Self-pay | Admitting: Family Medicine

## 2010-03-10 NOTE — Letter (Signed)
Summary: [facility] 3-hr glucola  [facility]  [address]   [place], [place] [address]   Phone: [phone]  Fax: [phone]    02/25/2009  Kendra Harris [address] [place], [place]  [address]  Dear Ms. HOGGARD,  You missed an appointment today to test your glucose and diabetes.  Untreated diabetes can affect the development of your baby.  Please call the [facility] right away to reschedule this appointment. It is important that this 3-hour glucola test is done as soon as possible.       Sincerely,   Osker Ayoub MD  Appended Document: [facility] 3-hr glucola mailed.

## 2010-03-10 NOTE — Progress Notes (Signed)
Summary: called pt/re: 3-hr GTT/TS  ---- Converted from flag ---- ---- 02/28/2009 3:40 PM, Cat Ta MD wrote: Please call pt again to come in for another 3-hr glucola.  Please put appt in Centric[facility] so that we have proof of this.  Thank you.  CT ------------------------------  called pt. pt will make appt for the 3-hr GTT ASAP. Her child was sick, she could not make it. Pt went to her appt at the [facility] 02-17-09 and also has and f/up appt at the [facility] on 03-03-09 and she will keep the appt. I encourage the pt to keep up the good work, but not to forget how important the 3-hr GTT test is. Pt agreed and verbalized understanding. fwd. to Dr.Ta

## 2010-03-10 NOTE — Progress Notes (Signed)
Summary: meds prob: changed Maxalt to Fioricet  Medications Added ANOLOR 300 50-325-40 MG CAPS (BUTALBITAL-APAP-CAFFEINE) take 1-2 tablets every 4 hours but no more than 6 tablets in 1 day.       Phone Note Call from Patient Call back at [facility] Phone [phone]   Caller: Patient Summary of Call: states that the Maxalt is too expensive and needs something cheaper [facility] [address]  Initial call taken by: De Nurse,  September 17, 2009 8:37 AM  Follow-up for Phone Call        to Dr. Clotilde Dieter Follow-up by: Golden Circle RN,  September 17, 2009 8:43 AM  Additional Follow-up for Phone Call Additional follow up Details #1::        Will change to Fioricet. Please let the patient know to pick this up.  Additional Follow-up by: Jamie Brookes MD,  September 17, 2009 12:08 PM    New/Updated Medications: ANOLOR 300 50-325-40 MG CAPS [facility]) take 1-2 tablets every 4 hours but no more than 6 tablets in 1 day. Prescriptions: ANOLOR 300 50-325-40 MG CAPS (BUTALBITAL-APAP-CAFFEINE) take 1-2 tablets every 4 hours but no more than 6 tablets in 1 day.  #40 x 3   Entered and Authorized by:   Jamie Brookes MD   Signed by:   Jamie Brookes MD on 09/17/2009   Method used:   Electronically to        [id number] (retail)       [address]       [place], [place]  [id number]       Ph: [phone]       Fax: [phone]   RxID:   [id number]

## 2010-03-10 NOTE — Progress Notes (Signed)
Summary: Rx Prob   Phone Note Call from Patient Call back at Home Phone [phone]   Caller: Patient Summary of Call: Needs new rx that Dr. Clotilde Dieter sent in to go to [facility] [address] Also has a question about if she should be taking the high blood pressure medication also. Initial call taken by: Clydell Hakim,  September 17, 2009 2:10 PM  Follow-up for Phone Call       Follow-up by: Golden Circle RN,  September 17, 2009 2:37 PM    Prescriptions: ANOLOR 300 50-325-40 MG CAPS [facility]) take 1-2 tablets every 4 hours but no more than 6 tablets in 1 day.  #40 x 3   Entered by:   Golden Circle RN   Authorized by:   Jamie Brookes MD   Signed by:   Golden Circle RN on 09/17/2009   Method used:   Electronically to        [id number] (retail)       [address]       [place], [place]  [address]       Ph: [phone]       Fax: [phone]   RxID:   [id number]  pt thought she was going to be rx'd bp meds. told her I will ask md & call her with response.Golden Circle RN  September 17, 2009 2:42 PM  She is already on propranolol. Her BP was ok last time (130/90). Have her come into the office in 1 week for a BP check. If her BP is still > 130/80 then I would start HCTZ 12.5mg . Will not start anything at this point.Jamie Brookes MD  September 17, 2009 3:40 PM  made her an appt to be seen by pcp next wed. gave her above info.Golden Circle RN  September 17, 2009 4:32 PM

## 2010-03-10 NOTE — Progress Notes (Signed)
Summary: [place] request   Phone Note Call from Patient Call back at [phone]   Summary of Call: wants to be seen for swollen leg and knee Initial call taken by: Haydee Salter,  September 01, 2006 3:07 PM  Follow-up for Phone Call        Phone call to pt- left voicemail to return my call.  Follow-up by: AMY MARTIN RN,  September 01, 2006 3:24 PM  Additional Follow-up for Phone Call Additional follow up Details #1::        Pt states her knee has been swelling and slightly painful for months.  States the swelling had gone away a few days ago, but is now back.  Pt states it is not very painful at all, and ibuprofen helps.  Advised pt to try icing knee, and staying off of it as much as possible over next couple of days to see if swelling subsides.  Advised I do not think she needs a work in appt at this point, especially because she has an appt with Dr. Pearletha Forge 09/06/06.  Asked pt to address issue with Dr. Pearletha Forge at her appt next week.   Additional Follow-up by: AMY MARTIN RN,  September 01, 2006 4:12 PM

## 2010-03-10 NOTE — Progress Notes (Signed)
Summary: Triage call   Phone Note Call from Patient   Caller: Patient Call For: [phone] Summary of Call: Cold symptoms x 3 days.  Want to be worked in tomorrow Initial call taken by: Abundio Miu,  December 03, 2009 3:38 PM  Follow-up for Phone Call        Attempted to return call, no answer.  [facility] saying that we could put her in the [place] [facility] tomorrow am if she still wants to be seen. Follow-up by: Dennison Nancy RN,  December 03, 2009 3:43 PM

## 2010-03-10 NOTE — Progress Notes (Signed)
Summary: APPT AT [facility]   Please call [facility] to schedule appt for pt.  According to 2nd Tri u/s pt is now 28 5/7 wga.  Pt missed 01/13/09 appt and has not been seen.  Pt has chronic HTN and Gestational DM.  She stopped taking Labetalol.  Please call and send certify letter to patient once appt has been made.  Cat Ta MD  February 14, 2009 8:45 AM CALLED PT AND LMVM TO CALL BACK.Arlyss Repress CMA,  February 14, 2009 9:12 AM spoke with pt. I will call [facility] today and sched. appt.Arlyss Repress CMA,  February 14, 2009 9:27 AM CALLED [facility] and [facility] to call back. re: appt for pt. pt needs appt ASAP. we have faxed all the records and Dr.Ta has communicated with the [facility] and was told, that they would see the pt asap.  Waiting for call back from [facility] .Arlyss Repress CMA,  February 14, 2009 9:35 AM called [facility] again. unable to leave message and no one answered phone at the [facility] CMA,  February 14, 2009 10:52 AM  appt sched. at the [facility] for Mon 02-17-09 at 8:45am. faxed last OV and labs to Attn:Kelly. called pt and pt WILL KEEP THIS APPT..cancelled the appt with Dr.Ta. informed Dr.Ta of above.Arlyss Repress CMA,  February 14, 2009 4:04 PM

## 2010-03-10 NOTE — Letter (Signed)
Summary: Generic Letter: Register letter for another [facility] for 3-hr glucol  [facility]  [address]   [place], [place] [address]   Phone: [phone]  Fax: [phone]    02/28/2009  Kendra Harris [address] [place], [place]  [address]  Dear Ms. [name],  This letter is to inform you that you missed another appointment today to test your glucose and diabetes.  Untreated diabetes can affect the development of your baby.  Please call the [facility] right away to reschedule this appointment. It is important that this 3-hour glucola test is done as soon as possible.      Sincerely,   Rasheida Broden MD  Appended Document: Generic Letter: Register letter for another [facility] for 3-hr glucol sent registered mail.

## 2010-03-10 NOTE — Assessment & Plan Note (Signed)
Summary: pp check,df   Vital Signs:  Patient profile:   30 year old female Weight:      245.5 pounds BMI:     42.96 Pulse rate:   88 / minute BP sitting:   130 / 78  (right arm)  Vitals Entered By: Arlyss Repress CMA, (July 17, 2009 2:54 PM) CC: pp check and discuss headaches. Is Patient Diabetic? No Pain Assessment Patient in pain? yes     Location: head Intensity: 3 Onset of pain  x 10 mos   Primary Care Provider:  Raine Elsass MD  CC:  pp check and discuss headaches..  History of Present Illness: 30 y/o G3P3 here for pp check.  Delivered viable female on 05/06/09 by [facility]VD.    Post partum: no sadness, depression, no si/hi.  does have time for herself because her mom picks up her two older children 8-3 mon-fri.  Starting next week they will be in daycare.   Menses started since delivery  Money concerns: no.  Maxie (father of children) helps with money for the children.  Pt will start back at [facility] [employer] in 2 wks.  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     HA: since last pregnancy (July).   lasts 2-3 hrs, 2-3 times per week.  from back of head to front of head.  no nausea. no vomiting.  + phonophobia.  +photophobia.  tylenol, ibuprofen, aleve, excedrine for migraine = no relief Symptoms Nausea/vomiting: no Photophobia: yes Phonophobia: yes Tearing of eyes: yes Sinus pain/pressure: sometmes Relation to menstrual cycle: ---  Red Flags Fever: no Neck pain/stiffness: no Vision/speech difficulty: no Focal weakness or numbness: no Altered mental status: no Trauma: no Worse in am: no Anticoagulant use: no Immunocompromise: no  Family history of Migraine: no       Habits & Providers  Alcohol-Tobacco-Diet     Tobacco Status: never  Allergies: No Known Drug Allergies  Past History:  Past Medical History: G3P3003  Current Problems:  Hypertension Obesity, Class III Headache, migraine History of Gestational DM Poor  compliance  Social History: Lives with her 3 children.   Son Golden Circle born 8/06, Daughter Carmon Ginsberg born 2007. Daughter Tina Griffiths born 2011.   No smoking, alcohol or recreational drugs.  No smokers at home.  Finiances are tight at home. FOB: Maxie, does not live with her and the children but sees the children everyday.  He helps with financially.    Review of Systems       per hpi  Physical Exam  General:  Well-developed,well-nourished,in no acute distress; alert,appropriate and cooperative throughout examination. vitals reviewed Head:  Normocephalic and atraumatic without obvious abnormalities. No apparent alopecia or balding. Eyes:  No corneal or conjunctival inflammation noted. EOMI. Perrla. Funduscopic exam benign, without hemorrhages, exudates or papilledema. Vision grossly normal. Neck:  supple, full ROM, and no masses.   Genitalia:  Bimanual exam: normal uterus size and position and no adnexal masses or tenderness.   Neurologic:  No cranial nerve deficits noted. Station and gait are normal. Plantar reflexes are down-going bilaterally. DTRs are symmetrical throughout. Sensory, motor and coordinative functions appear intact.alert & oriented X3, cranial nerves II-XII intact, strength normal in all extremities, sensation intact to light touch, sensation intact to pinprick, gait normal, DTRs symmetrical and normal, finger-to-nose normal, heel-to-shin normal, toes down bilaterally on Babinski, and Romberg negative.   Cervical Nodes:  No lymphadenopathy noted Axillary Nodes:  No palpable lymphadenopathy Psych:  Cognition  and judgment appear intact. Alert and cooperative with normal attention span and concentration. No apparent delusions, illusions, hallucinations   Impression & Recommendations:  Problem # 1:  POSTPARTUM EXAMINATION (ICD-V24.2) PP exam normal.  No red flags about pp depression.  Pt appropriate.  Her two older children will be attending day care, starting next week.  She plans to go  back to work part time in 2 wks.  Will rtc in 2-3 wks for Mirena insertion.     [id number]  Problem # 2:  MIGRAINE VARIANT (ICD-346.20) Assessment: New Will try bb as prophylaxis and Imitrex to treat headache.  Neuro exam wnl.  Will check TSH.  Pt to rtc in a few wks for f/u.  Can increase dose of atenolol if pt sees improvment in HA.   The following medications were removed from the medication list:    Trandate 100 Mg Tabs (Labetalol hcl) .Marland Kitchen... 1 tab by mouth two times a day Her updated medication list for this problem includes:    Atenolol 50 Mg Tabs (Atenolol) .Marland Kitchen... 1 tab by mouth daily for preventing of migraine headache    Imitrex 50 Mg Tabs (Sumatriptan succinate) .Marland Kitchen... 1 tab by mouth for headache.  may repeat in 2 hours.  max 4 pills per day.  [id number]  Complete Medication List: 1)  Atenolol 50 Mg Tabs (Atenolol) .Marland Kitchen.. 1 tab by mouth daily for preventing of migraine headache 2)  Imitrex 50 Mg Tabs (Sumatriptan succinate) .Marland Kitchen.. 1 tab by mouth for headache.  may repeat in 2 hours.  max 4 pills per day.  Patient Instructions: 1)  Please schedule a follow-up appointment in 2-3  weeks for IUD. 2)  Please schedule appt in 4-6 wks for headache.  3)  Atenolol: daily to prevent headache 4)  Imitrex: to treat headache.  Can take 2hours apart.  Max 4 tab per day. Prescriptions: IMITREX 50 MG TABS (SUMATRIPTAN SUCCINATE) 1 tab by mouth for headache.  May repeat in 2 hours.  Max 4 pills per day.  #20 x 1   Entered and Authorized by:   Angeline Slim MD   Signed by:   Angeline Slim MD on 07/17/2009   Method used:   Electronically to        [facility] [address] (retail)       [address]       [place], [place]  [id number]       Ph: [phone]       Fax: [phone]   RxID:   [id number] ATENOLOL 50 MG TABS (ATENOLOL) 1 tab by mouth daily for preventing of migraine headache  #30 x 2   Entered and Authorized by:   Angeline Slim MD    Signed by:   Angeline Slim MD on 07/17/2009   Method used:   Electronically to        [facility] [address] (retail)       [address]       [place], [place]  [id number]       Ph: [phone]       Fax: [phone]   RxID:   [id number]

## 2010-03-10 NOTE — Assessment & Plan Note (Signed)
Summary: pp pain,df  no charge pt arrived; pt 1 hour late.Loralee Pacas CMA  May 20, 2009 11:16 AM  Allergies: No Known Drug Allergies   Complete Medication List: 1)  Trandate 100 Mg Tabs (Labetalol hcl) .Marland Kitchen.. 1 tab by mouth two times a day 2)  Prenatal Vitamins 0.8 Mg Tabs (Prenatal multivit-min-fe-fa) .Marland Kitchen.. 1 tab by mouth daily  Other Orders: No Charge Patient Arrived (NCPA0) (NCPA0)  Appended Document: pp pain,df     Allergies: No Known Drug Allergies   Complete Medication List: 1)  Trandate 100 Mg Tabs (Labetalol hcl) .Marland Kitchen.. 1 tab by mouth two times a day 2)  Prenatal Vitamins 0.8 Mg Tabs (Prenatal multivit-min-fe-fa) .Marland Kitchen.. 1 tab by mouth daily  Other Orders: No Charge Patient Arrived (NCPA0) (NCPA0)

## 2010-03-10 NOTE — Assessment & Plan Note (Signed)
Summary: MIGRAINES/KH   Vital Signs:  Patient profile:   30 year old female Temp:     98.4 degrees F oral Pulse rate:   64 / minute Pulse rhythm:   regular BP sitting:   140 / 89  (left arm) Cuff size:   large  Vitals Entered By: Loralee Pacas CMA (August 22, 2009 9:24 AM)  Primary Care Provider:  Cat Ta MD  CC:  Migraines.  History of Present Illness: Migraines: has tried Tylenol, Motrin, Excedrine for migraines, on Atenolol and Imitrex;  not helping HA last 3-4 hours,  sounds bother her, no sensitivity to light,  has to lay down in the bedroom by herself. At home, not working but has 3 children to take care of.  mostly left sided headache, no blurry vision.  no family history of migraines.  happening daily, started in March after her daugther was born.  no nausea or vomiting with headaches     Current Medications (verified): 1)  Propranolol Hcl 80 Mg Tabs (Propranolol Hcl) .... Take 1 Pill 3 Times A Day For Headache Prevention 2)  Maxalt 5 Mg Tabs (Rizatriptan Benzoate) .... Take 1 Pill At The Onset of A Headache, If No Relief Take Another Pill in 2 Hours. May Take Up To 3 Pill in 24 Hours But No More Than 3.  Allergies (verified): No Known Drug Allergies  Review of Systems        vitals reviewed and pertinent negatives and positives seen in HPI   Physical Exam  General:  Well-developed,well-nourished,in no acute distress; alert,appropriate and cooperative throughout examination Head:  Normocephalic and atraumatic without obvious abnormalities. No apparent alopecia or balding. Psych:  Cognition and judgment appear intact. Alert and cooperative with normal attention span and concentration. No apparent delusions, illusions, hallucinations   Impression & Recommendations:  Problem # 1:  MIGRAINE HEADACHE (ICD-346.90) Assessment Deteriorated Pt has migraines that are persistant and not getting relief from current meds. Plan to change to Propranolol and Maxalt. Given  headache diary and handout from [facility] about migraines and triggers. Will f/u with PCP if no relief starting in 2 weeks.   Her updated medication list for this problem includes:    Propranolol Hcl 80 Mg Tabs (Propranolol hcl) .Marland Kitchen... Take 1 pill 3 times a day for headache prevention    Maxalt 5 Mg Tabs (Rizatriptan benzoate) .Marland Kitchen... Take 1 pill at the onset of a headache, if no relief take another pill in 2 hours. may take up to 3 pill in 24 hours but no more than 3.  Orders: [facility]- Est Level  3 ([address]409)  Complete Medication List: 1)  Propranolol Hcl 80 Mg Tabs (Propranolol hcl) .... Take 1 pill 3 times a day for headache prevention 2)  Maxalt 5 Mg Tabs (Rizatriptan benzoate) .... Take 1 pill at the onset of a headache, if no relief take another pill in 2 hours. may take up to 3 pill in 24 hours but no more than 3.  Patient Instructions: 1)  If you do not get some relief in the next 2 weeks with these new meds, start your headache diary and bring it to your next appointment with Dr. Janalyn Harder.  2)  Avoid the trigger foods in your handout.  3)  Take the medicine as prescribed.  Prescriptions: MAXALT 5 MG TABS (RIZATRIPTAN BENZOATE) take 1 pill at the onset of a headache, if no relief take another pill in 2 hours. May take up to 3 pill in 24 hours but  no more than 3.  #60 x 3   Entered and Authorized by:   Jamie Brookes MD   Signed by:   Jamie Brookes MD on 08/22/2009   Method used:   Electronically to        [facility] [phone]* (retail)       [address]       [place], [place]  [address]       Ph: [phone]       Fax: [phone]   RxID:   [id number] PROPRANOLOL HCL 80 MG TABS (PROPRANOLOL HCL) take 1 pill 3 times a day for headache prevention  #90 x 3   Entered and Authorized by:   Jamie Brookes MD   Signed by:   Jamie Brookes MD on 08/22/2009   Method used:   Electronically to        [facility] [phone]* (retail)       [address]       [place], [place]  [address]        Ph: [phone]       Fax: [phone]   RxID:   [id number]

## 2010-03-10 NOTE — Assessment & Plan Note (Signed)
Summary: headaches,tcb   Vital Signs:  Patient profile:   30 year old female Height:      63.5 inches Weight:      240.5 pounds BMI:     42.09 Temp:     98.2 degrees F oral Pulse rate:   91 / minute BP sitting:   135 / 79  (left arm) Cuff size:   large  Vitals Entered By: Gladstone Pih (February 13, 2009 4:30 PM) CC: C/O HA Is Patient Diabetic? No Pain Assessment Patient in pain? yes     Location: head Intensity: 6 Type: aching   Primary Care Provider:  Cat Ta MD  CC:  C/O HA.  History of Present Illness: CC: headaches  G3P2002 presents at 4:30pm for work in for HA (too late to do glucola).  EDC: 05/05/09 from [place].   FH: 29cm FHT: 140s  1. HA - h/o migraines.  last month has had worsening headaches.  Characterized as achey occipital, radiate to right frontal and periorbital.  No n/v/photo.  + phonophobia.  Better with rest.  Has tried excedrin, alleve, ES tylenol x2.  Not helping.  Last 2 pregnancies didn't have change in migraines, but this pregnancy has had worsening.  2. high risk OB - unsure dates.  late to [facility].  high risk (CHTN, h/o GDM).  Has been unable to get into high risk clinic.  [facility] appt [facility]/13/2009.  Called once last month to reschedule on a Monday, has not tried since.  stopped labetalol because making sleepy during work.  Habits & Providers  Alcohol-Tobacco-Diet     Tobacco Status: never  Current Medications (verified): 1)  Trandate 100 Mg Tabs (Labetalol Hcl) .Marland Kitchen.. 1 Tab By Mouth Two Times A Day 2)  Prenatal Vitamins 0.8 Mg Tabs (Prenatal Multivit-Min-Fe-Fa) .Marland Kitchen.. 1 Tab By Mouth Daily  Allergies (verified): No Known Drug Allergies  Past History:  Past medical, surgical, family and social histories (including risk factors) reviewed, and no changes noted (except as noted below).  Past Medical History: Reviewed history from 12/13/2007 and no changes required. diet controlled GDM w/G1, G2P2 healthy boy 8/06- 6#14oz, and also 8/07, GBS  Current  Problems:  Hypertension OBESITY, NOS (ICD-278.00)  Past Surgical History: Reviewed history from 09/06/2006 and no changes required. None  Family History: Reviewed history from 01/09/2009 and no changes required. mother w/HTN, sister w/sickle cell No diabetes No Cancer, No CAD  Social History: Reviewed history from 01/09/2009 and no changes required. Poor compliance.  FOB intermittently involved.  Lives w/mother, sister.  Son Golden Circle born 8/06, Daughter Carmon Ginsberg born 2007.  No smoking, alcohol or recreational drugs.  No smokers at home.  Finiances are tight at home.  Physical Exam  General:  Well-developed,well-nourished,in no acute distress; alert,appropriate and cooperative throughout examination. obese Head:  Normocephalic and atraumatic without obvious abnormalities. No apparent alopecia or balding. Eyes:  No corneal or conjunctival inflammation noted. EOMI. Perrla. Funduscopic exam benign, without hemorrhages, exudates or papilledema.  copper wiring on fundoscopic exam Abdomen:  FH 29 cm FHT 140s Skin:  Intact without suspicious lesions or rashes   Impression & Recommendations:  Problem # 1:  MIGRAINE HEADACHE (ICD-346.90)  Discussed lifestyle modifications to prevent headaches.  continue tylenol.  NO MORE EXCEDRIN OR ALLEVE.  cut back on sodas.  more water.  rtc if these measures not working.  Her updated medication list for this problem includes:    Trandate 100 Mg Tabs (Labetalol hcl) .Marland Kitchen... 1 tab by mouth two times a day  Orders: [facility]- Est  Level 4 (04540)  Problem # 2:  PREGNANT STATE, INCIDENTAL (ICD-V22.2) Again stressed importance of prenatal care, especially with CHTN and h/o GDM.  continue multivitamins.  To call tomorrow to set up [facility] appt, call us if unable to get through.  HIV/RPR, Hgb (11.2) and cbg today.  Orders: Hemoglobin-[facility] (98119) Glucose Cap-[facility] (14782) HIV-[facility] ([address]21-30865) RPR-[facility] ([address]8469-62952) [facility]- Est  Level 4 (84132)  Problem # 3:  DIABETES  MELLITUS, GESTATIONAL, HX OF (ICD-V13.29) cbg 78 today.  still needs 3 hour. Orders: Glucose Cap-[facility] (44010) [facility]- Est  Level 4 (27253)  Problem # 4:  ESSENTIAL HYPERTENSION, BENIGN (ICD-401.1)  rec taking labetalol later in day to hopefully circumvent sedation side effect. Her updated medication list for this problem includes:    Trandate 100 Mg Tabs (Labetalol hcl) .Marland Kitchen... 1 tab by mouth two times a day  Orders: [facility]- Est  Level 4 ([address]214)  Complete Medication List: 1)  Trandate 100 Mg Tabs (Labetalol hcl) .Marland Kitchen.. 1 tab by mouth two times a day 2)  Prenatal Vitamins 0.8 Mg Tabs (Prenatal multivit-min-fe-fa) .Marland Kitchen.. 1 tab by mouth daily  Patient Instructions: 1)  Please return in 3-4 weeks for follow up of headahces. 2)  Cut back on sodas.  More water should help both pregnancy and headaches. 3)  Call tomorrow to schedule appt at [phone] 7:30am to 12pm.  (They are already closed today). 4)  Continue taking prenatal vitamins.  The safest thing to take during pregnancy is tylenol.  Return if headaches not controlled with tylenol, stopping soda, and increasing water. 5)  Your best date of delivery by ultrasound is 05/05/2009 (which makes you 28 weeks and 3 days today.) 6)  Try labetalol later in day (11am then 9pm) to help with sleepiness during work. 7)  Call clinic with questions.  Laboratory Results   Blood Tests   Date/Time Received: February 13, 2009 5:00 PM  Date/Time Reported: February 13, 2009 5:09 PM     CBC   HGB:  11.2 g/dL   (Normal Range: 74.2-59.5 in Males, 12.0-15.0 in Females) Comments: ...........test performed by...........Marland KitchenTerese Door, CMA

## 2010-03-10 NOTE — Assessment & Plan Note (Signed)
Summary: puritis   Vital Signs:  Patient profile:   30 year old female Height:      63.5 inches Weight:      248 pounds BMI:     43.40 Temp:     98.7 degrees F oral Pulse rate:   70 / minute BP sitting:   130 / 90  (right arm) Cuff size:   large  Vitals Entered By: Jimmy Footman, CMA (September 11, 2009 10:32 AM) CC: rash x 1 days, itching Is Patient Diabetic? No Pain Assessment Patient in pain? no        Primary Care Provider:  Cat Ta MD  CC:  rash x 1 days and itching.  History of Present Illness: Rash: Pt had rash that broke out at 6pm and this morning it was gone. It was on the back, arms, neck and legs. No new lotions, soaps, detergents, or foods. Rash was puritic and it still puritic but the bumps are gone.   Habits & Providers  Alcohol-Tobacco-Diet     Tobacco Status: never     Passive Smoke Exposure: no  Current Medications (verified): 1)  Propranolol Hcl 80 Mg Tabs (Propranolol Hcl) .... Take 1 Pill 3 Times A Day For Headache Prevention 2)  Maxalt 5 Mg Tabs (Rizatriptan Benzoate) .... Take 1 Pill At The Onset of A Headache, If No Relief Take Another Pill in 2 Hours. May Take Up To 3 Pill in 24 Hours But No More Than 3. 3)  Hydroxyzine Hcl 50 Mg/ml Soln (Hydroxyzine Hcl) .... Take 1 Pill Every 6-8 Hours As Needed For Itching. May Make You Sleepy, Don't Drive While Taking This Medicine.  Allergies (verified): No Known Drug Allergies  Review of Systems       no other symptoms, no fevers, chills, no sick contacts  Physical Exam  General:  Well-developed,well-nourished,in no acute distress; alert,appropriate and cooperative throughout examination Skin:  very minor rash on left forearm but not other places.    Impression & Recommendations:  Problem # 1:  PRURITUS (ICD-698.9) Assessment New Pt has puritis even after the rash has practically disappeared. I have sent in a Rx for Hydroxyzine. Pt is to return if the rash returns and is very  concerning.    Orders: [facility]- Est Level  3 (17616)  Complete Medication List: 1)  Propranolol Hcl 80 Mg Tabs (Propranolol hcl) .... Take 1 pill 3 times a day for headache prevention 2)  Maxalt 5 Mg Tabs (Rizatriptan benzoate) .... Take 1 pill at the onset of a headache, if no relief take another pill in 2 hours. may take up to 3 pill in 24 hours but no more than 3. 3)  Hydroxyzine Hcl 50 Mg/ml Soln (Hydroxyzine hcl) .... Take 1 pill every 6-8 hours as needed for itching. may make you sleepy, don't drive while taking this medicine.  Patient Instructions: 1)  If your rash returns, you can use OTC hydrocortisone cream.  2)  Use the medicine for itching every 6-8 hours as needed.  3)  Come back is the rash persists.  Prescriptions: HYDROXYZINE HCL 50 MG/ML SOLN (HYDROXYZINE HCL) take 1 pill every 6-8 hours as needed for itching. May make you sleepy, don't drive while taking this medicine.  #30 x 0   Entered and Authorized by:   Jamie Brookes MD   Signed by:   Jamie Brookes MD on 09/11/2009   Method used:   Electronically to        [facility]  [id number] (retail)       [address]       [place], [place]  [address]       Ph: [phone]       Fax: [phone]   RxID:   [id number]   Appended Document: puritis Pharmacy called back to ask if rx was to be filled as solution or pill. Per Dr. Clotilde Dieter told pharmacy to fill in pill form.Marland KitchenMarland KitchenJimmy Footman, CMA 11:30am 09/11/2009

## 2010-03-10 NOTE — Progress Notes (Signed)
Summary: Triage   Phone Note Call from Patient Call back at Home Phone [phone]   Reason for Call: Talk to Nurse Summary of Call: pt is requesting wi appt tomorrow for her and her son, pt is having migraines & meds are not working, pts son is having pain  Initial call taken by: Knox Royalty,  August 21, 2009 8:55 AM  Follow-up for Phone Call        states the imitrex does not help. no HA today but states the HA come on suddenly with no warning. wants to get something else that will help . wi at 8:30 with son on Friday Follow-up by: Golden Circle RN,  August 21, 2009 10:29 AM

## 2010-03-10 NOTE — Miscellaneous (Signed)
Summary: order for 3-hr glucola   Clinical Lists Changes  Orders: Added new Test order of Glucose 3 hr-[facility] (82951/82952) - Signed  Appended Document: order for 3-hr glucola called pt and instructed to call and schedule appt for 3 hour glucose.

## 2010-03-12 NOTE — Progress Notes (Signed)
Summary: phn msg   Phone Note Call from Patient Call back at [phone]   Caller: Patient Summary of Call: needs to talk to nurse about her visit last Monday Initial call taken by: De Nurse,  March 04, 2010 11:55 AM  Follow-up for Phone Call        CALLED the number given & got a message to call [phone]. this number is not working. will wait for her to call us back Follow-up by: Golden Circle RN,  March 04, 2010 1:40 PM  Additional Follow-up for Phone Call Additional follow up Details #1::        Called pt back.  Pt wanted to know what she can use to rinse her mouth.  Told pt she can rinse with warm salt water.  She has not started on amx yet.  Advised starting on this asap.  She agreed.  Additional Follow-up by: Kesha Hurrell MD,  March 04, 2010 1:48 PM

## 2010-03-12 NOTE — Assessment & Plan Note (Signed)
Summary: cold/HA/back pain/eo   Vital Signs:  Patient profile:   30 year old female Height:      62 inches Weight:      246 pounds BMI:     45.16 Temp:     98.7 degrees F Pulse rate:   71 / minute BP sitting:   154 / 114  (left arm) Cuff size:   large  Vitals Entered By: Dennison Nancy RN (February 11, 2010 9:30 AM) CC: [place] for cough/congestion and back pain Is Patient Diabetic? No Pain Assessment Patient in pain? yes     Location: left lower back Intensity: 3   Primary Care Provider:  Cat Ta MD  CC:  [place] for cough/congestion and back pain.  History of Present Illness: 1) URI symptoms: Started yesterday. Cough w/ clear to yellow sputum, myalgias, rhinorrhea, headache. No known sick contact. Deneis nausea, vomiting or diarrhea, chest pain, wheezing, dyspnea, fever, sore throat. Does not smoke. Has taken Nyquil last night with some relief of symptoms. Eating and drinking well.   2) Flank / low back pain: Since yesterday. Bilateral. Dull, "sore" pain. Intermittent. Mild to moderate. Feels like last time she had a UTI. Also feels like last time she was pregnant. Since yesterday. Denies dysuria, hematura, pyuria, nausea, vomiting or diarrhea, abdomina pain, vaginitis or vaginal discharge.   3) STD testing: Requests HIV testing today. Denies fever, chills, nausea, emesis, weight loss, night sweats.     Habits & Providers  Alcohol-Tobacco-Diet     Tobacco Status: never     Passive Smoke Exposure: no  Current Medications (verified): 1)  Propranolol Hcl 80 Mg Tabs (Propranolol Hcl) .... Take 1 Pill 3 Times A Day For Headache Prevention 2)  Anolor 300 50-325-40 Mg Caps (Butalbital-Apap-Caffeine) .... Take 1-2 Tablets Every 4 Hours But No More Than 6 Tablets in 1 Day. 3)  Hydroxyzine Hcl 50 Mg/ml Soln (Hydroxyzine Hcl) .... Take 1 Pill Every 6-8 Hours As Needed For Itching. May Make You Sleepy, Don't Drive While Taking This Medicine.  Allergies (verified): No Known Drug  Allergies  Physical Exam  General:  obese, NAD, pleasant, vitals reviewed.  Head:  no sinus tenderness  Eyes:  no conjunctivitis  Ears:  TMs clear bilaterally  Nose:  +clear rhinorrhea, mild congestion  Mouth:  moist membranes w/o erythema or exudate  Neck:  supple, full ROM, and no masses.   Lungs:  CTAB w/o wheeze or crackles  Heart:  RRR, no murmurs  Abdomen:  obese, soft, non-tender, no CVA tenderness, +BS  Msk:  mild pain at lower lumbar region with flexion / extenstion / lateral bending / rotation but full ROM . Pulses:  2+ radials  Extremities:  good cap refill; no edema  Neurologic:  alert & oriented X3.   Skin:  no rash; + hirsuitism    Impression & Recommendations:  Problem # 1:  DELAYED MENSES (ICD-626.8) Assessment New Upreg negative. Likely secondary to morbid obesity (especially given hirsuitism). Will have PCP proceed with further workup.   Orders: U Preg-[facility] (81025) [facility]- Est  Level 4 (99214)  Problem # 2:  CONTACT OR EXPOSURE TO OTHER VIRAL DISEASES (ICD-V01.79) Will check HIV, RPR today. Advised on safe sexual practices if new, untested partner.   Orders: HIV-[facility] ([address]109-60454) RPR-[facility] [id number]) [facility]- Est  Level 4 (29562)  Problem # 3:  URI (ICD-465.9)  Likely viral. Symptomatic treatment discussed - advised to avoid pseudoephedrine-containing products with HTN likely cause of elevated BP today). Red flags that would prompt  return to care were reviewed with patient and patient expressed understanding. Follow up if not improved in 2 weeks.   Orders: [facility]- Est  Level 4 (16109)  Problem # 4:  UNSPECIFIED BACKACHE (ICD-724.5) Likely with viral URI as above. Advised regarding limited use of over the counter NSAID w/ HTN history.  Tylenol as needed for pain. UA wnl except for dehydration.   Complete Medication List: 1)  Propranolol Hcl 80 Mg Tabs (Propranolol hcl) .... Take 1 pill 3 times a day for headache prevention 2)  Anolor 300 50-325-40 Mg Caps  (Butalbital-apap-caffeine) .... Take 1-2 tablets every 4 hours but no more than 6 tablets in 1 day. 3)  Hydroxyzine Hcl 50 Mg/ml Soln (Hydroxyzine hcl) .... Take 1 pill every 6-8 hours as needed for itching. may make you sleepy, don't drive while taking this medicine.  Other Orders: Urinalysis-[facility] (00000)  Patient Instructions: 1)  Make sure you stay well hydrated - you have not been getting enough to drink.  2)  Follow up if not improving AFTER 14 days 3)  Take over the counter cough medication (make sure that the cough medication does not have Sudafed or pseudoephedrine, so that it is safe to use with high blood pressure). It will have a red heart on the box to show that it is safe to use. 4)  If the over the counter cough medicine has acetaminopen or Tylenol in it, do not give additional Tylenol as these are the same thing, otherwise can give Tylenol for fevers.    Orders Added: 1)  Urinalysis-[facility] [00000] 2)  U Preg-[facility] [81025] 3)  HIV-[facility] [[id number]] 4)  RPR-[facility] [[id number]] 5)  [facility]- Est  Level 4 [13086]    Laboratory Results   Urine Tests  Date/Time Received: February 11, 2010 9:36 AM  Date/Time Reported: February 11, 2010 10:05 AM   Routine Urinalysis   Color: yellow Appearance: Clear Glucose: negative   (Normal Range: Negative) Bilirubin: negative   (Normal Range: Negative) Ketone: negative   (Normal Range: Negative) Spec. Gravity: >=1.030   (Normal Range: 1.003-1.035) Blood: trace-lysed   (Normal Range: Negative) pH: 6.0   (Normal Range: 5.0-8.0) Protein: negative   (Normal Range: Negative) Urobilinogen: 0.2   (Normal Range: 0-1) Nitrite: negative   (Normal Range: Negative) Leukocyte Esterace: moderate   (Normal Range: Negative)  Urine Microscopic WBC/HPF: >20 RBC/HPF: rare Bacteria/HPF: 3+ Epithelial/HPF: >20 with clue cells seen    Urine HCG: negative Comments: ...............test performed by......Marland KitchenBonnie A. [place], MLS (ASCP)cm

## 2010-03-12 NOTE — Assessment & Plan Note (Signed)
Summary: knot in neck,df   Vital Signs:  Patient profile:   30 year old female Weight:      245 pounds Pulse rate:   66 / minute BP sitting:   126 / 84  (right arm)  Vitals Entered By: Arlyss Repress CMA, (March 02, 2010 9:35 AM) CC: felt knot under right jaw on saturday. denies pain. Is Patient Diabetic? No Pain Assessment Patient in pain? no        Primary Care Provider:  Cat Ta MD  CC:  felt knot under right jaw on saturday. denies pain.Marland Kitchen  History of Present Illness: Pt reports feeling swelling under R jaw since Saturday. Denies fever, odynophagia, purulent oral drainage. Has also been having R post tooth pain for the last 2-3 weeks. Has not gone to the dentist for 2-3 years. Is waiting to save up money to afford to go. Pt does report hx/o cavities. pain most prominent on palpation of lateral R jaw. No nuchal rigidity, trismus, rash, HA, visual changes.   Habits & Providers  Alcohol-Tobacco-Diet     Tobacco Status: never  Allergies: No Known Drug Allergies  Physical Exam  General:  alert and overweight-appearing.   Head:  normocephalic and atraumatic.   Mouth:  + operculum on R molar, + ttp on post molar, + min-moderate soft tissue swelling along R post molar.  Neck:  fullROM. no cervical LAD, + mild ttp along distribution of submanibular gland.  Lungs:  CTAB, no wheezes, rales, rhoncii,  Heart:  RRR, no rubs, gallops, murmurs    Impression & Recommendations:  Problem # 1:  DISTURBANCES IN TOOTH ERUPTION (ICD-520.6) Partially impacted wisdom tooth with secondary inflammatory changes in R submandibular gland.  Will give rx for amoxicillin for abx coverage as well as referall to dentistry for further management.  Instructed to use tylenol as needed for pain. Discussed infectious red flags. Will follow up as needed.  Orders: Dental Referral (Dentist) [facility]- Est Level  3 (91478)  Complete Medication List: 1)  Propranolol Hcl 80 Mg Tabs (Propranolol hcl) .... Take 1  pill 3 times a day for headache prevention 2)  Anolor 300 50-325-40 Mg Caps (Butalbital-apap-caffeine) .... Take 1-2 tablets every 4 hours but no more than 6 tablets in 1 day. 3)  Hydroxyzine Hcl 50 Mg/ml Soln (Hydroxyzine hcl) .... Take 1 pill every 6-8 hours as needed for itching. may make you sleepy, don't drive while taking this medicine. 4)  Amoxicillin 500 Mg Caps (Amoxicillin) .Marland Kitchen.. 1 tab by mouth two times a day  Patient Instructions: 1)  It was good to meet you  2)  I am giving you a trial of antibiotics for your wisdom tooth 3)  Please followup with the dentist 4)  If you develop any severe fever, mouth pain, or any other concerning symptoms, please give [place] a call. 5)  God Bless, 6)  Doree Albee MD  Prescriptions: AMOXICILLIN 500 MG CAPS (AMOXICILLIN) 1 tab by mouth two times a day  #14 x 0   Entered and Authorized by:   Doree Albee MD   Signed by:   Doree Albee MD on 03/02/2010   Method used:   Electronically to        [facility] (retail)       [address]       [place], [place]  [id number]       Ph: [phone]       Fax: [phone]   RxID:   [id number]  Orders Added: 1)  Dental Referral [Dentist] 2)  [facility]- Est Level  3 [16109]

## 2010-03-12 NOTE — Miscellaneous (Signed)
Summary: Changing prob    Clinical Lists Changes  Problems: Removed problem of PRURITUS (ICD-698.9) Removed problem of POSTPARTUM EXAMINATION (ICD-V24.2) Removed problem of CONTRACEPTIVE MANAGEMENT (ICD-V25.09) Removed problem of HYPERCALCEMIA (ICD-275.42) Removed problem of CHEST PAIN, NON-CARDIAC (ICD-786.59)

## 2010-04-24 ENCOUNTER — Ambulatory Visit: Payer: Self-pay

## 2010-04-26 LAB — POCT URINALYSIS DIP (DEVICE)
Bilirubin Urine: NEGATIVE
Bilirubin Urine: NEGATIVE
Glucose, UA: NEGATIVE mg/dL
Glucose, UA: NEGATIVE mg/dL
Hgb urine dipstick: NEGATIVE
Hgb urine dipstick: NEGATIVE
Nitrite: NEGATIVE
Nitrite: NEGATIVE
Specific Gravity, Urine: 1.02 (ref 1.005–1.030)

## 2010-04-26 LAB — GLUCOSE, CAPILLARY: Glucose-Capillary: 78 mg/dL (ref 70–99)

## 2010-05-03 LAB — POCT URINALYSIS DIP (DEVICE)
Bilirubin Urine: NEGATIVE
Glucose, UA: NEGATIVE mg/dL
Glucose, UA: NEGATIVE mg/dL
Hgb urine dipstick: NEGATIVE
Nitrite: NEGATIVE
Nitrite: NEGATIVE

## 2010-05-03 LAB — GLUCOSE, CAPILLARY: Glucose-Capillary: 86 mg/dL (ref 70–99)

## 2010-05-03 LAB — CBC
RBC: 4.27 MIL/uL (ref 3.87–5.11)
WBC: 8.5 10*3/uL (ref 4.0–10.5)

## 2010-05-03 LAB — RUBELLA SCREEN: Rubella: 225.9 IU/mL — ABNORMAL HIGH

## 2010-05-03 LAB — RPR: RPR Ser Ql: NONREACTIVE

## 2010-05-21 LAB — URINALYSIS, ROUTINE W REFLEX MICROSCOPIC
Glucose, UA: NEGATIVE mg/dL
Ketones, ur: NEGATIVE mg/dL
Nitrite: NEGATIVE
pH: 5.5 (ref 5.0–8.0)

## 2010-05-21 LAB — URINE CULTURE
Colony Count: NO GROWTH
Culture: NO GROWTH

## 2010-05-28 ENCOUNTER — Ambulatory Visit: Payer: Self-pay | Admitting: Family Medicine

## 2010-06-05 ENCOUNTER — Encounter: Payer: Self-pay | Admitting: Family Medicine

## 2010-06-07 IMAGING — CR DG CHEST 2V
2 series · 2 of 2 positions shown · non-contrast
Comparison: Chest x-ray of 01/26/2007

CLINICAL DATA: Chest pain, hypertension, morbid obesity

CHEST - 2 VIEW

[w chest pa]
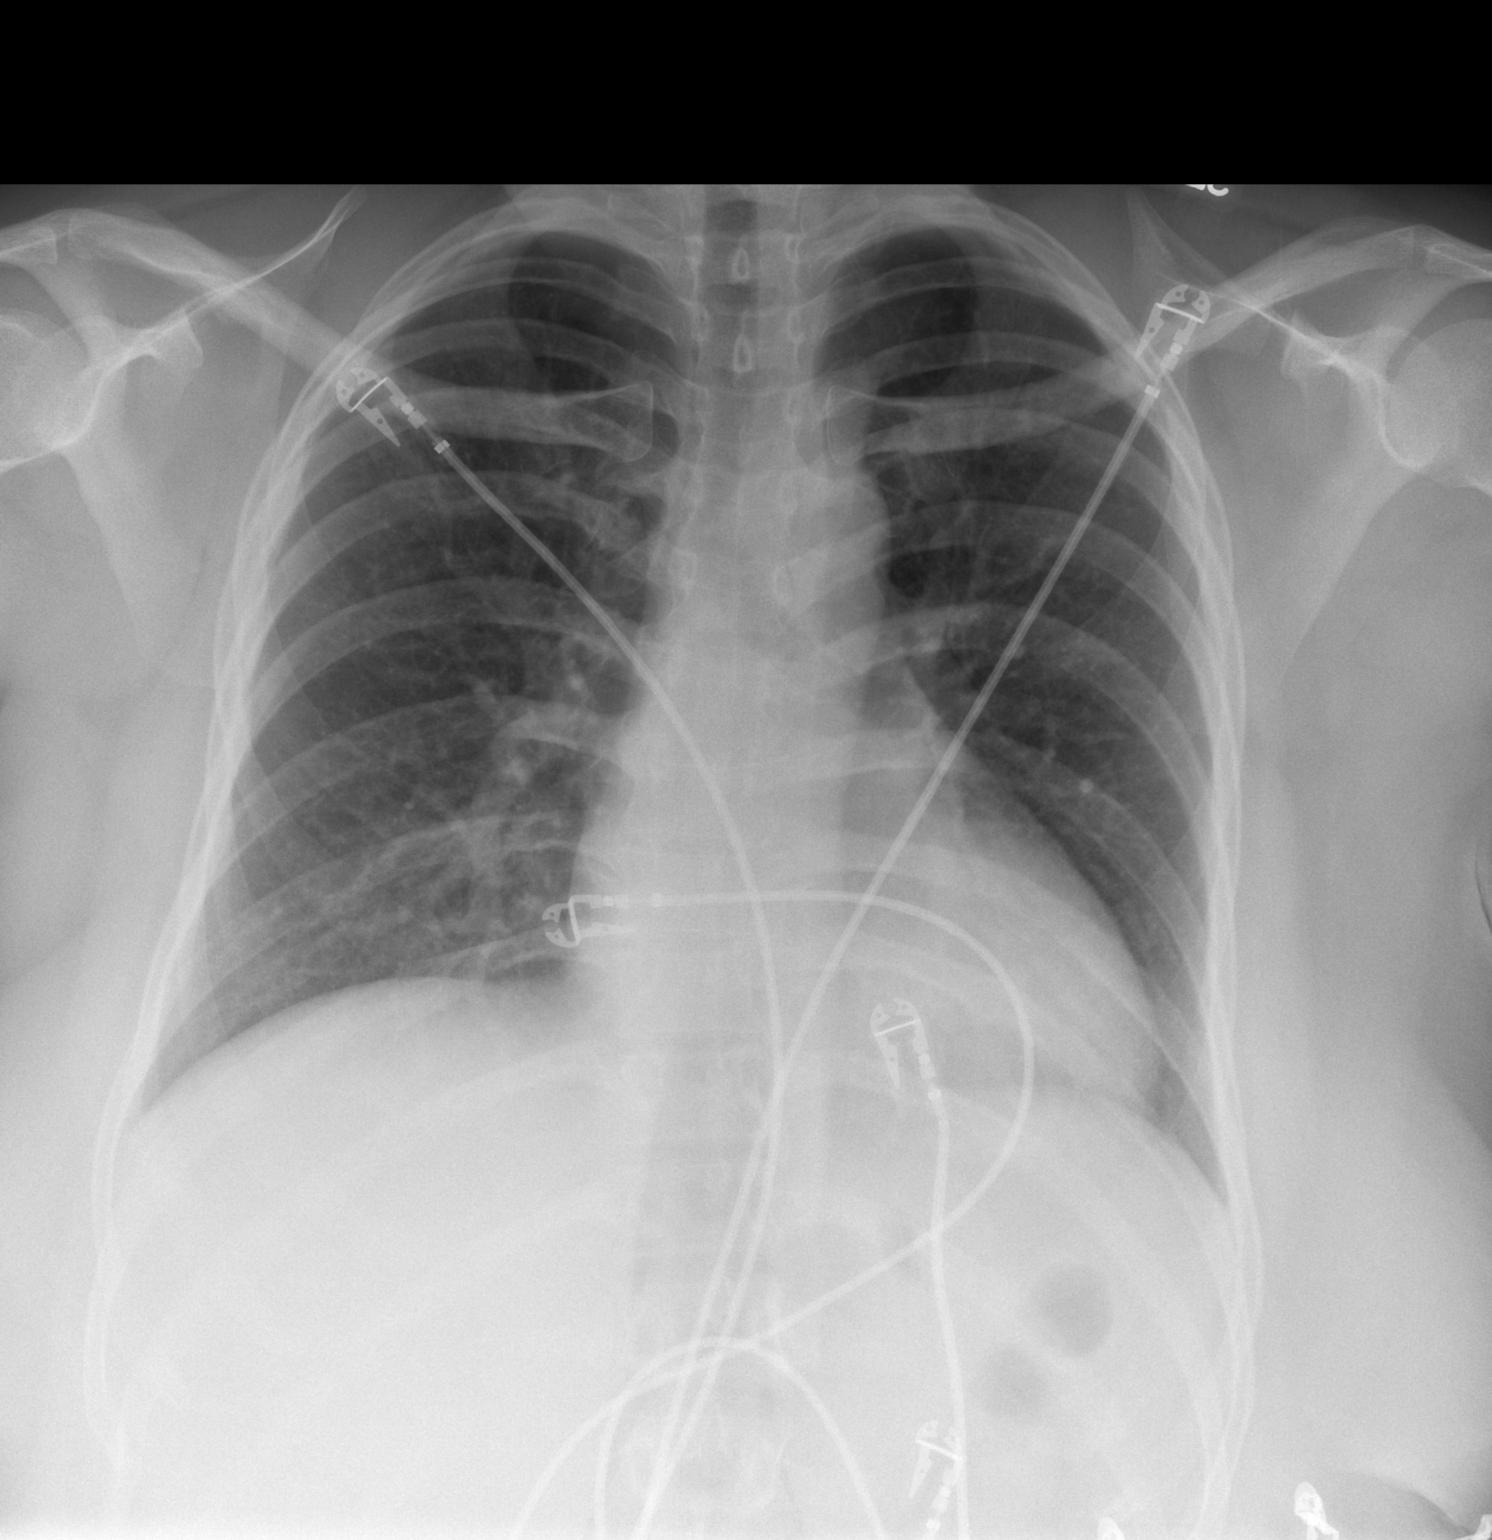

[w chest lat]
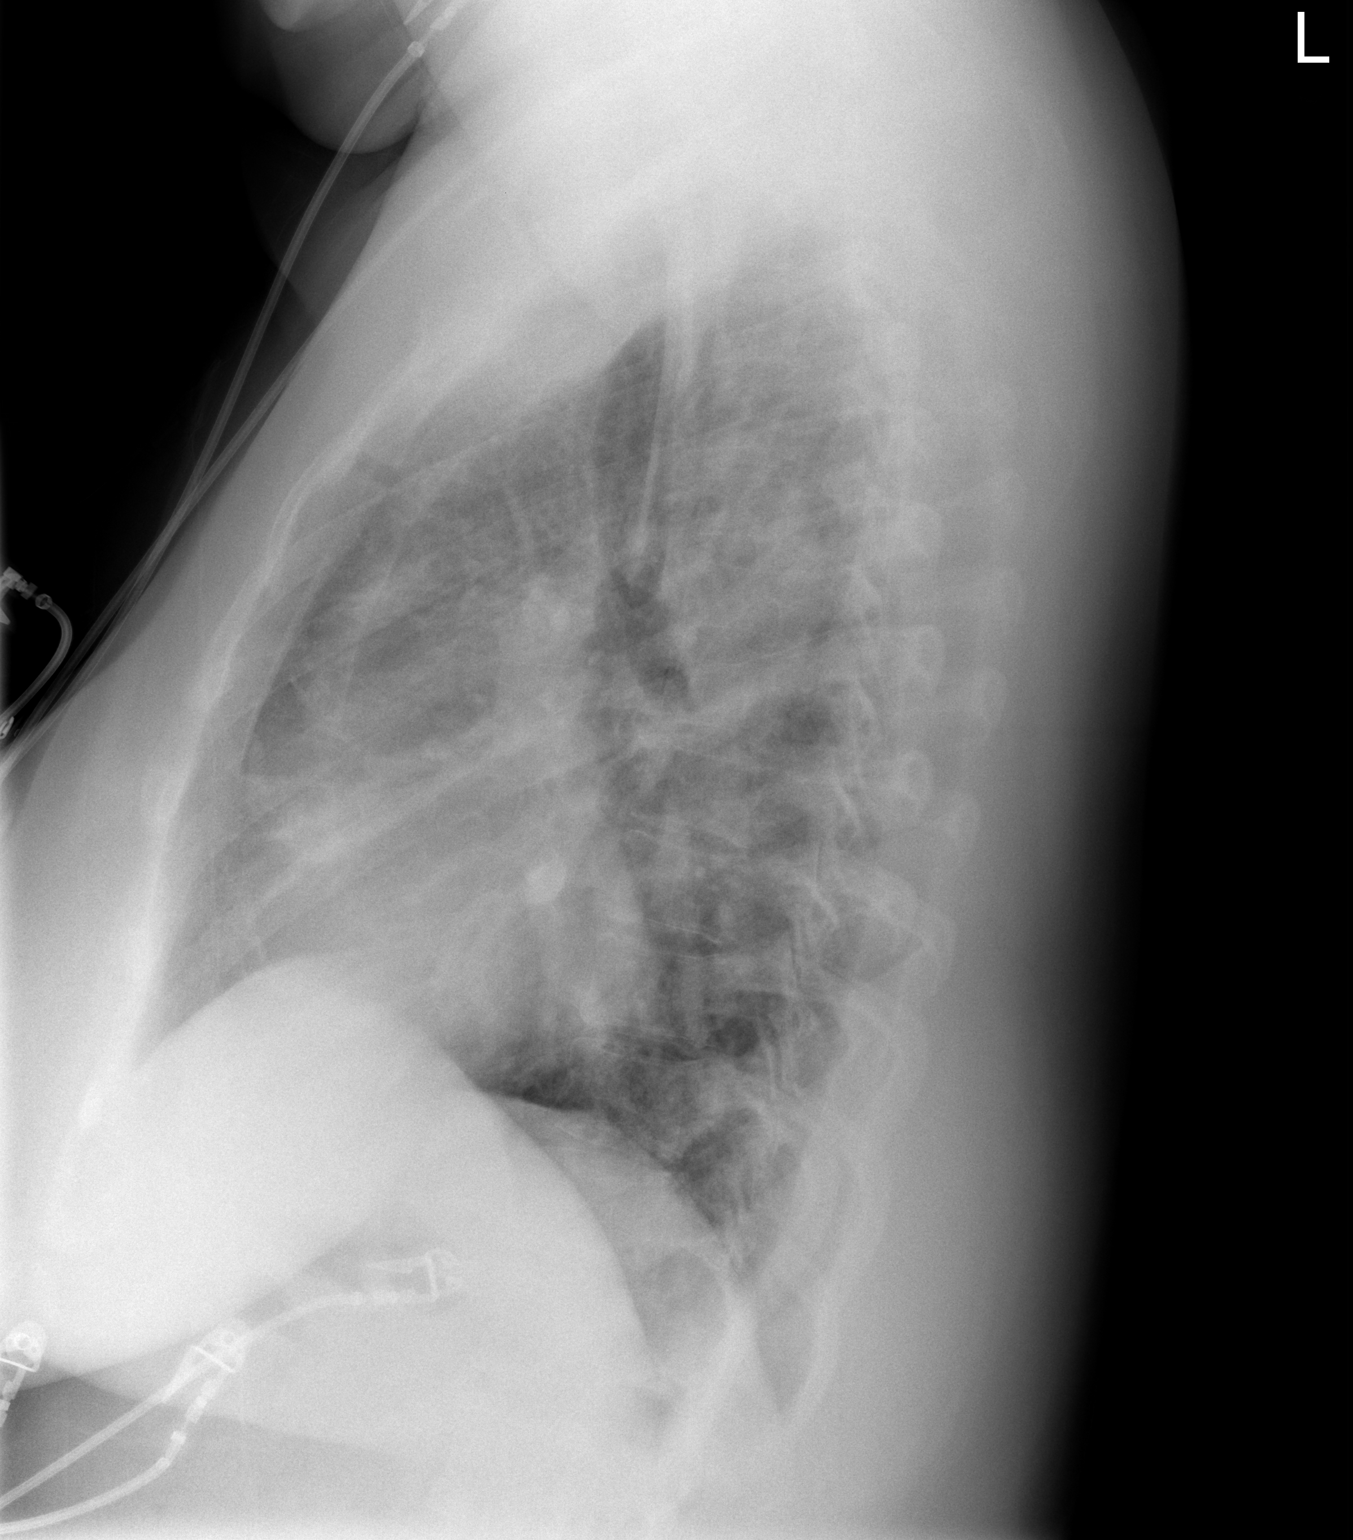

[2 of 2 positions shown; findings below may reference images not displayed]

FINDINGS: There is minimal streakiness anteriorly on the lateral
view possibly in the region of the lingula.  This could represent
atelectasis or early pneumonia and follow-up chest x-ray is
recommended.  The heart is within upper limits of normal.  No acute
bony abnormality is seen.
IMPRESSION: Streakiness anteriorly on the lateral view possibly the lingula.
Consider atelectasis or pneumonia.  Follow-up chest x-ray is
recommended.

## 2010-06-10 IMAGING — CR DG CHEST 2V
2 series · 2 of 2 positions shown · non-contrast
Comparison: 12/16/2007

CLINICAL DATA: Chest pain and shortness of breath.

CHEST - 2 VIEW

[view not recorded (1 of 2)]
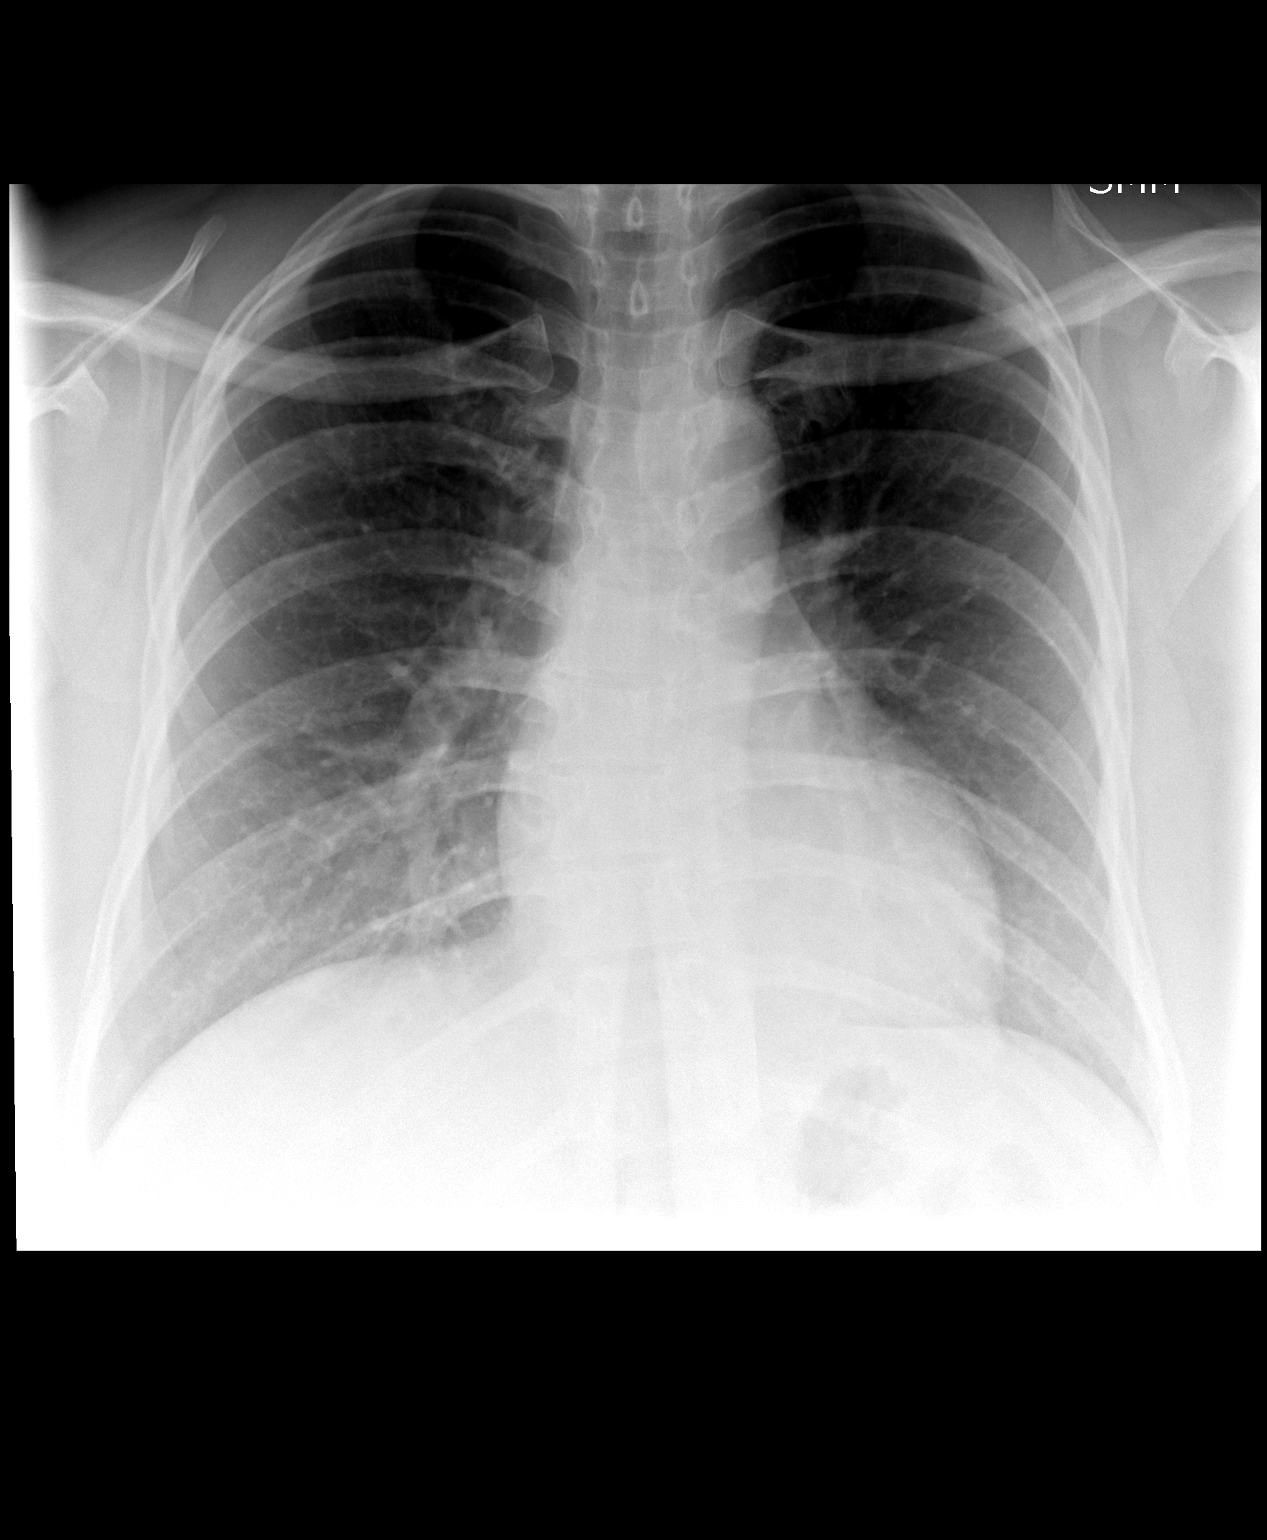

[view not recorded (2 of 2)]
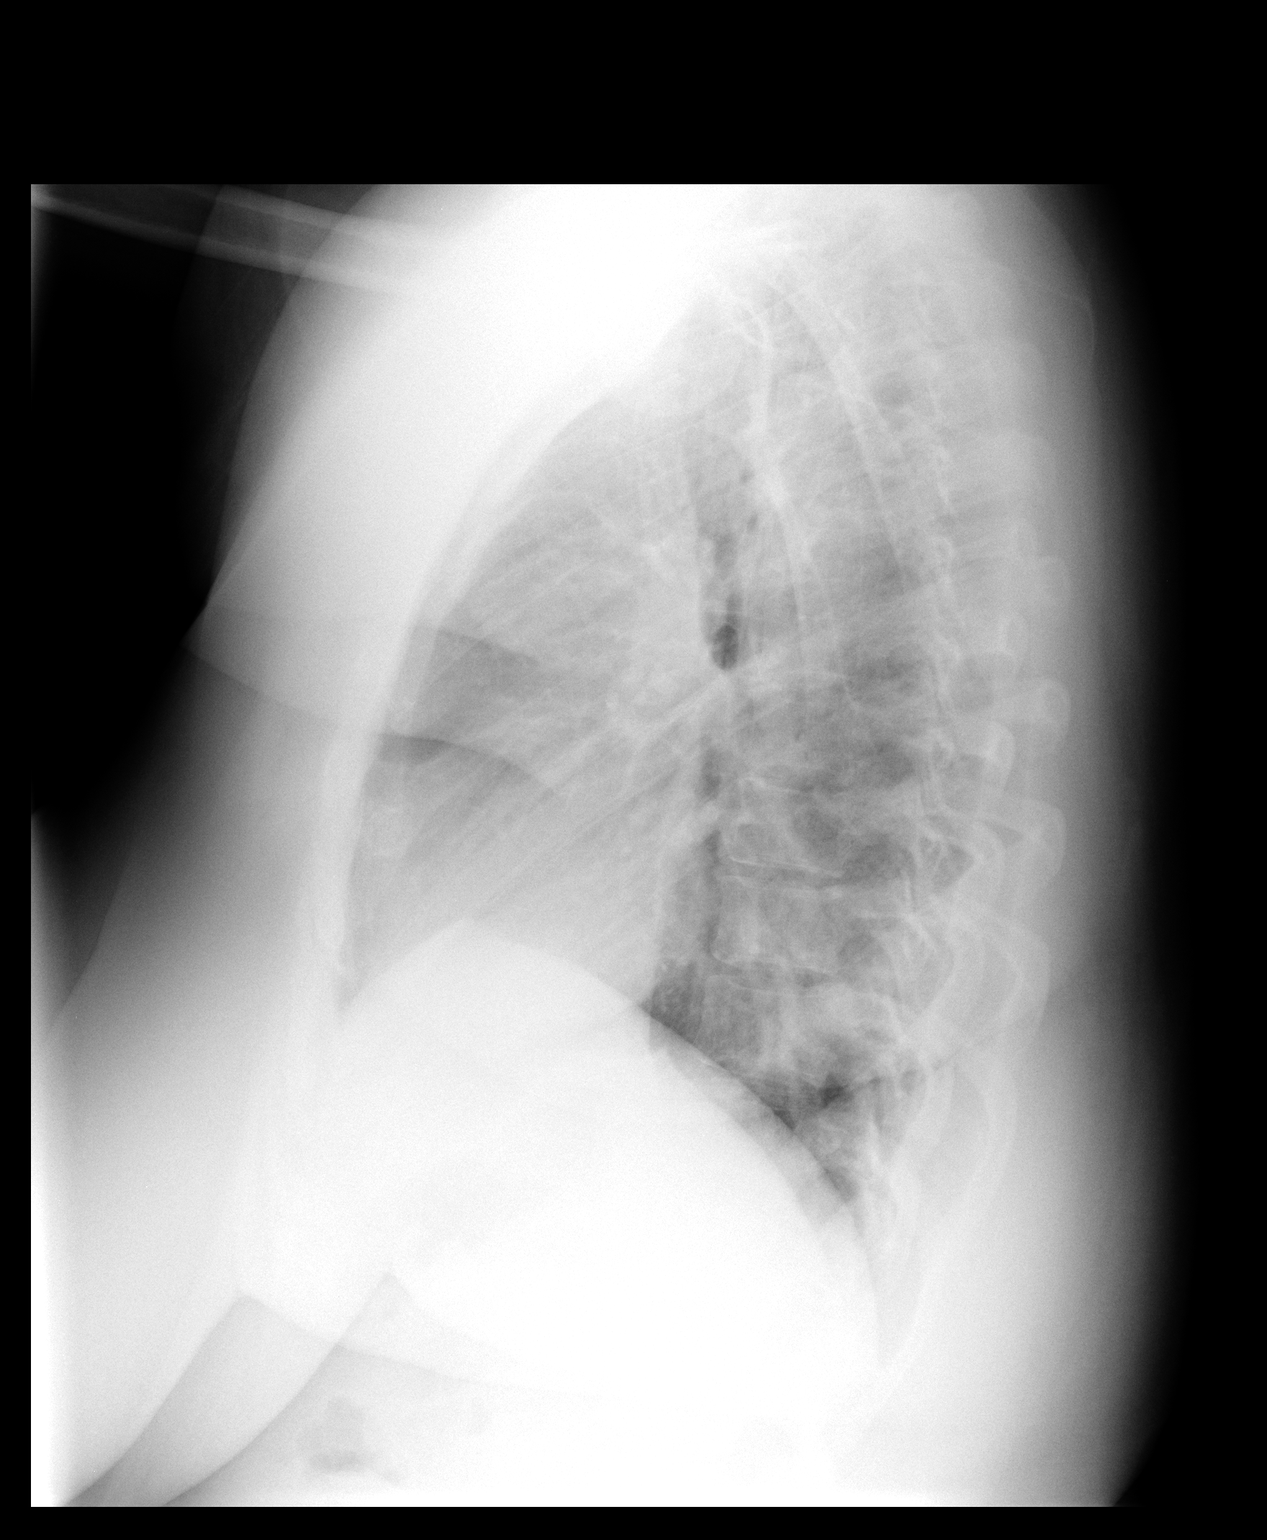

[2 of 2 positions shown; findings below may reference images not displayed]

FINDINGS: The heart size and mediastinal contours are within normal
limits.  Both lungs are clear.  The visualized skeletal structures
are unremarkable.
IMPRESSION: No active cardiopulmonary disease.

## 2010-06-26 ENCOUNTER — Ambulatory Visit (INDEPENDENT_AMBULATORY_CARE_PROVIDER_SITE_OTHER): Payer: Medicaid Other | Admitting: Family Medicine

## 2010-06-26 DIAGNOSIS — R319 Hematuria, unspecified: Secondary | ICD-10-CM

## 2010-06-26 DIAGNOSIS — K219 Gastro-esophageal reflux disease without esophagitis: Secondary | ICD-10-CM

## 2010-06-26 DIAGNOSIS — N390 Urinary tract infection, site not specified: Secondary | ICD-10-CM

## 2010-06-26 DIAGNOSIS — R300 Dysuria: Secondary | ICD-10-CM

## 2010-06-26 LAB — POCT URINALYSIS DIPSTICK
Glucose, UA: NEGATIVE
Ketones, UA: NEGATIVE
Protein, UA: 100
Spec Grav, UA: >=1.03

## 2010-06-26 MED ORDER — CEPHALEXIN 500 MG PO CAPS: 500.0000 mg | ORAL_CAPSULE | Freq: Four times a day (QID) | ORAL | Status: DC

## 2010-06-26 MED ORDER — HYDROXYZINE HCL 50 MG PO TABS: ORAL_TABLET | ORAL | Status: DC

## 2010-06-26 MED ORDER — RANITIDINE HCL 150 MG PO TABS: 150.0000 mg | ORAL_TABLET | Freq: Every day | ORAL | Status: DC

## 2010-06-26 MED ORDER — BUTALBITAL-APAP-CAFFEINE 50-325-40 MG PO CAPS: 1.0000 | ORAL_CAPSULE | ORAL | Status: DC

## 2010-06-26 MED ORDER — PROPRANOLOL HCL 80 MG PO TABS: 80.0000 mg | ORAL_TABLET | Freq: Three times a day (TID) | ORAL | Status: DC

## 2010-06-26 MED ORDER — PHENAZOPYRIDINE HCL 100 MG PO TABS: 100.0000 mg | ORAL_TABLET | Freq: Three times a day (TID) | ORAL | Status: DC | PRN
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
## 2010-06-26 NOTE — Patient Instructions (Signed)
Take the antibiotics as prescribed Use the pyridium for burning if needed Take the Zantac daily for the next 2 weeks If this does not improve then come back to be seen Give a urine sample at your daughters appt
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
## 2010-06-26 NOTE — Progress Notes (Signed)
  Subjective:    Patient ID: Almond Lint, female    DOB: 04-May-1980, 30 y.o.   MRN: [id number]  HPI Abd pain x 3 days, starts in epigastric region and radiates up chest, burning sensation, occ nausea, no emesis, able to eat, has had history of acid reflux in the past, feels similar    Dysuria- past 3 days, noticed small amount of blood with wiping, no vaginal discharge, no abd cramping, foul odor to urine, history of UTI's approx 3/per    Review of Systems  No loose stools, no fever     Objective:   Physical Exam        Gen- NAD, alert and oriented, obese        ABD- NABS, soft,mild TTP in epigastric area, ND, no CVA tendernes, mild suprapubic pressure, no rebound, no gaurding        CVS-RRR, no murmur        RESP- CTAB       Assessment & Plan:   Acid reflux- trial of Zantac, pt to watch triggering foods  UTI- as pt has recurrent UTI, will send for culture, Keflex high dose, secondary to recurrence, repeat UA secondary to Hematuria in 1 month

## 2010-06-30 LAB — URINE CULTURE: Colony Count: >=100000

## 2010-07-08 ENCOUNTER — Encounter: Payer: Medicaid Other | Admitting: Family Medicine

## 2010-07-24 ENCOUNTER — Encounter: Payer: Self-pay | Admitting: Family Medicine

## 2010-07-24 ENCOUNTER — Ambulatory Visit (INDEPENDENT_AMBULATORY_CARE_PROVIDER_SITE_OTHER): Payer: Medicaid Other | Admitting: Family Medicine

## 2010-07-24 VITALS — BP 127/84 | HR 67 | Wt 243.5 lb

## 2010-07-24 DIAGNOSIS — F329 Major depressive disorder, single episode, unspecified: Secondary | ICD-10-CM

## 2010-07-24 DIAGNOSIS — F32A Depression, unspecified: Secondary | ICD-10-CM | POA: Insufficient documentation

## 2010-07-24 DIAGNOSIS — F323 Major depressive disorder, single episode, severe with psychotic features: Secondary | ICD-10-CM | POA: Insufficient documentation

## 2010-07-24 MED ORDER — CITALOPRAM HYDROBROMIDE 20 MG PO TABS: 20.0000 mg | ORAL_TABLET | Freq: Every day | ORAL | Status: DC

## 2010-07-24 NOTE — Patient Instructions (Addendum)
Card for Dr. Shelby Mattocks- Call her and make appointment.  Start citalopram (celexa) 20mg  by mouth once daily, eat something with this to decrease potential side effect of nausea.  Schedule follow-up appointment for July 20th (or about a month from now).

## 2010-07-24 NOTE — Assessment & Plan Note (Signed)
1. Depressed mood: PHQ-9 score=19, indicated moderately severe depression.  A. Prescribed citalopram 20mg  po once daily.  Counseled pt re: potential side effects, and expected onset of clinical effects of medication  B. Provided pt with Dr. Roosvelt Maser card and instructed her to schedule an appt with her to initiate therapy.  C. Instructed pt to schedule follow-up appt for one month from now.  D. Suggested she attempt to increase exercise as tolerated.  E. Plan agreed upon by patient and medical team.

## 2010-07-24 NOTE — Progress Notes (Signed)
  Subjective:    Patient ID: Kendra Harris, female    DOB: 1980/04/19, 30 y.o.   MRN: [id number]  HPI 30 year old woman presents with suspected depression.  Today's PHQ-9 score=19 (moderately severe depression).  She reports symptoms started around June 1st- lost interest in normal activities, didn't want to take part in social interaction. Over last week, she has been crying a lot for "no reason", does not want to get out of bed, has no appetite (eats one meal a day because she knows she needs to), sleeps 2-3 hours/night, and has 2 BMs/week (normal is [place]).  She has had to take time off of work because of these symptoms.  She denies the following: SI/SA, thoughts about harming others, hopelessness, pain, myalgia/arthralgia.  She looks forward to time with her 3 young children (1, 48, 58 yrs old).  She has a very supportive family, with whom she and her children's father just moved in.    Pt has had a history of depression at 30 years old, as well as in 06/23/2004 after her grandmother died.  In 2004-06-23, she spent 4 days in a hospital for depression (self-initiated).  She was also treated with antidepressants in the past, but does not remember name.     Review of Systems  Constitutional: Positive for activity change, appetite change and fatigue. Negative for fever, chills, diaphoresis and unexpected weight change.  Gastrointestinal: Positive for constipation. Negative for nausea, vomiting, abdominal pain, diarrhea and abdominal distention.  Genitourinary: Negative for difficulty urinating.  Musculoskeletal: Negative for myalgias and arthralgias.  Neurological: Negative for dizziness and headaches.  Psychiatric/Behavioral: Positive for sleep disturbance and dysphoric mood. Negative for suicidal ideas, behavioral problems, confusion, self-injury and agitation. The patient is nervous/anxious.        Objective:   Physical Exam  Constitutional: She appears well-developed and well-nourished. No  distress.  Skin: She is not diaphoretic.  Psychiatric: Her behavior is normal. Judgment normal. Her affect is blunt. Her affect is not angry, not labile and not inappropriate. Her speech is not rapid and/or pressured, not delayed, not tangential and not slurred. She is not agitated, not aggressive, is not hyperactive, not slowed, not withdrawn, not actively hallucinating and not combative. Thought content is not delusional. Cognition and memory are normal. She exhibits a depressed mood. She expresses no suicidal plans and no homicidal plans. She is communicative.       Pt reports constant worry, sadness, crying, and inertia. She is attentive.          Assessment & Plan:

## 2010-09-18 ENCOUNTER — Ambulatory Visit (INDEPENDENT_AMBULATORY_CARE_PROVIDER_SITE_OTHER): Payer: Self-pay | Admitting: Family Medicine

## 2010-09-18 VITALS — BP 144/89 | HR 58 | Temp 97.8°F | Wt 243.1 lb

## 2010-09-18 DIAGNOSIS — J029 Acute pharyngitis, unspecified: Secondary | ICD-10-CM

## 2010-09-18 LAB — POCT RAPID STREP A (OFFICE): Rapid Strep A Screen: NEGATIVE

## 2010-09-18 NOTE — Assessment & Plan Note (Signed)
Strep negative. No red flags.likely viral URI.  Supportive care

## 2010-09-18 NOTE — Progress Notes (Signed)
  Subjective:    Patient ID: Kendra Harris, female    DOB: [birth year], 30 y.o.   MRN: [id number]  HPI  Sore throat x 5 days.  No fevers, mild cough, some congestion and postnasal drip  Mild jaw pain and swelling.  Very bad teeth.  Getting medicaid and aware of need for dentist.  Review of Systems Denies CP, SOB, HA, N/V/D, fever     Objective:   Physical Exam Vital signs reviewed General appearance - alert, well appearing, and in no distress and oriented to person, place, and time Eyes - pupils equal and reactive, extraocular eye movements intact, sclera anicteric Ears - bilateral TM's and external ear canals normal, right ear normal, left ear normal Nose - normal and patent, no erythema, discharge or polyps Mouth-  Poor dentition with broken teeth and gum redness no pus or abcess Tonsils without d/c, postnasal drip seen      Assessment & Plan:  Sore throat Strep negative. No red flags.likely viral URI.  Supportive care

## 2010-09-21 ENCOUNTER — Ambulatory Visit: Payer: Medicaid Other

## 2010-10-05 IMAGING — CR DG CHEST 2V
2 series · 2 of 2 positions shown · non-contrast
Comparison: 12/19/2007

CLINICAL DATA: Fever.  Shortness of breath.  Body aches.  Cough

CHEST - 2 VIEW

[w chest pa]
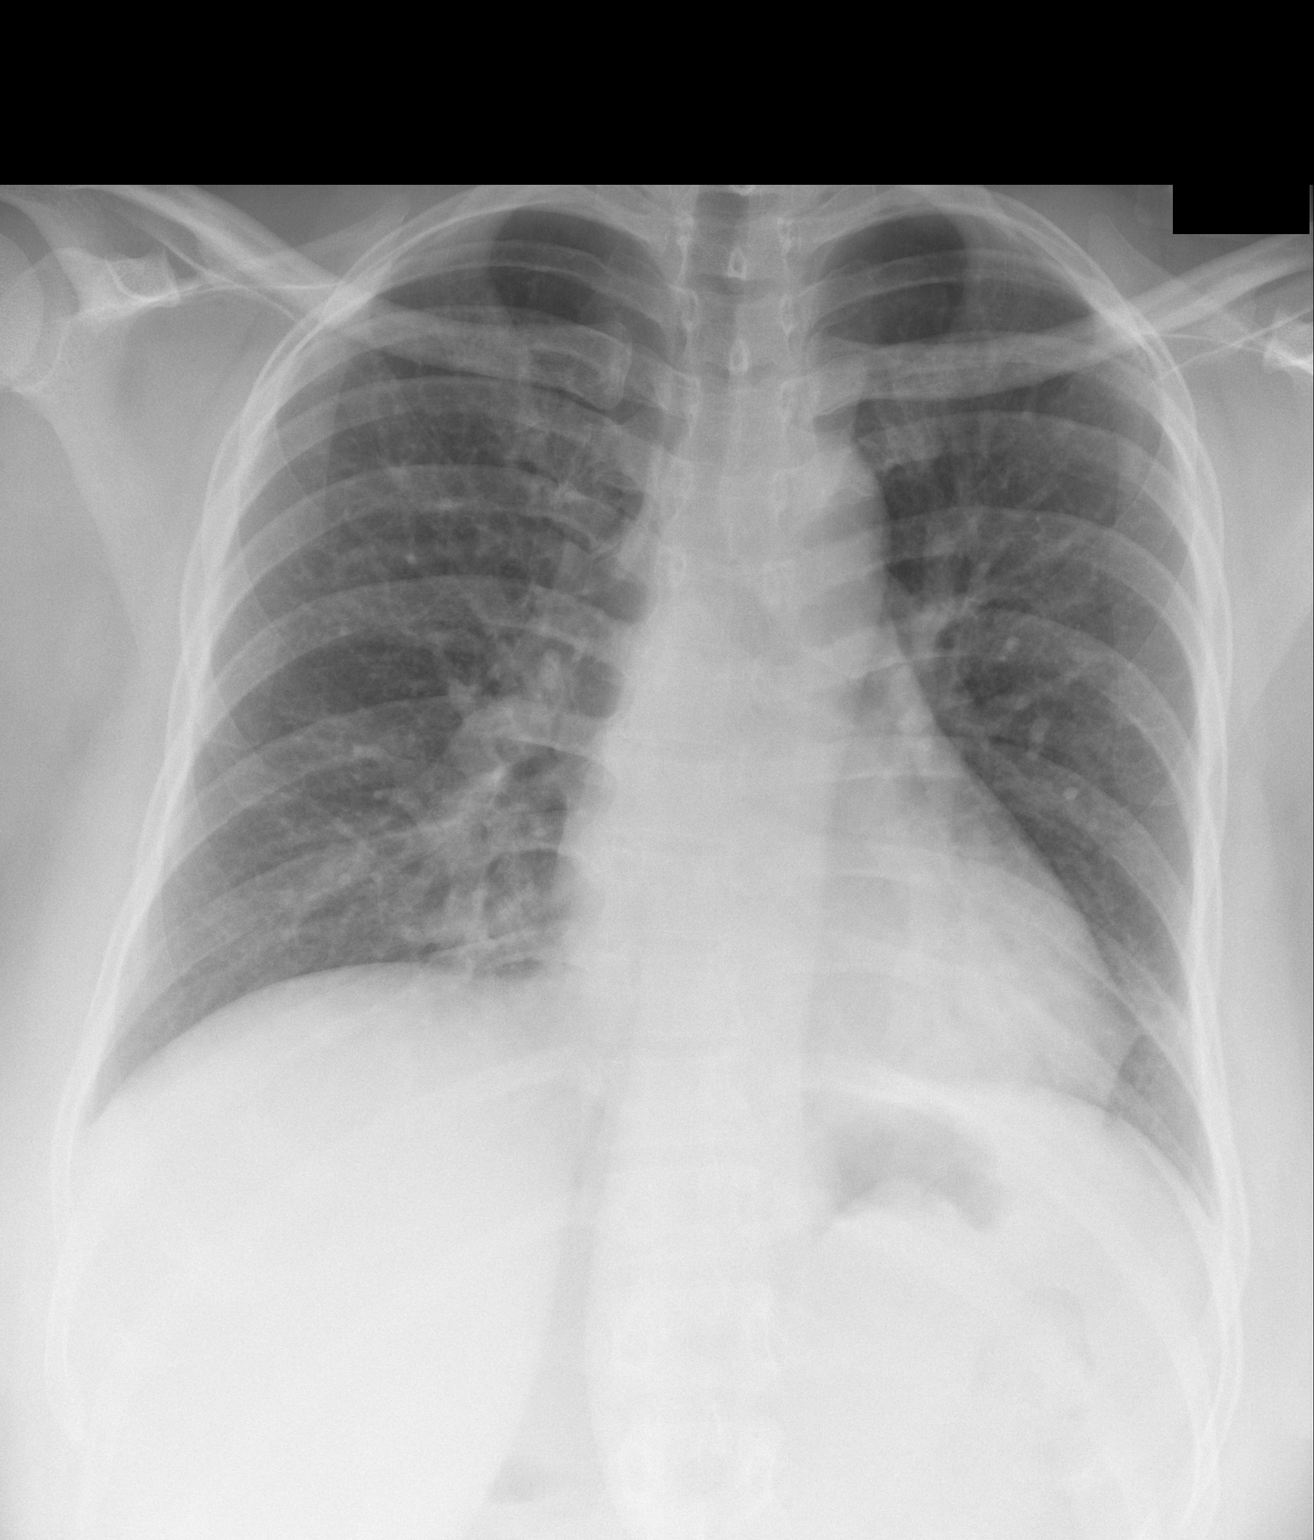

[w chest lat *]
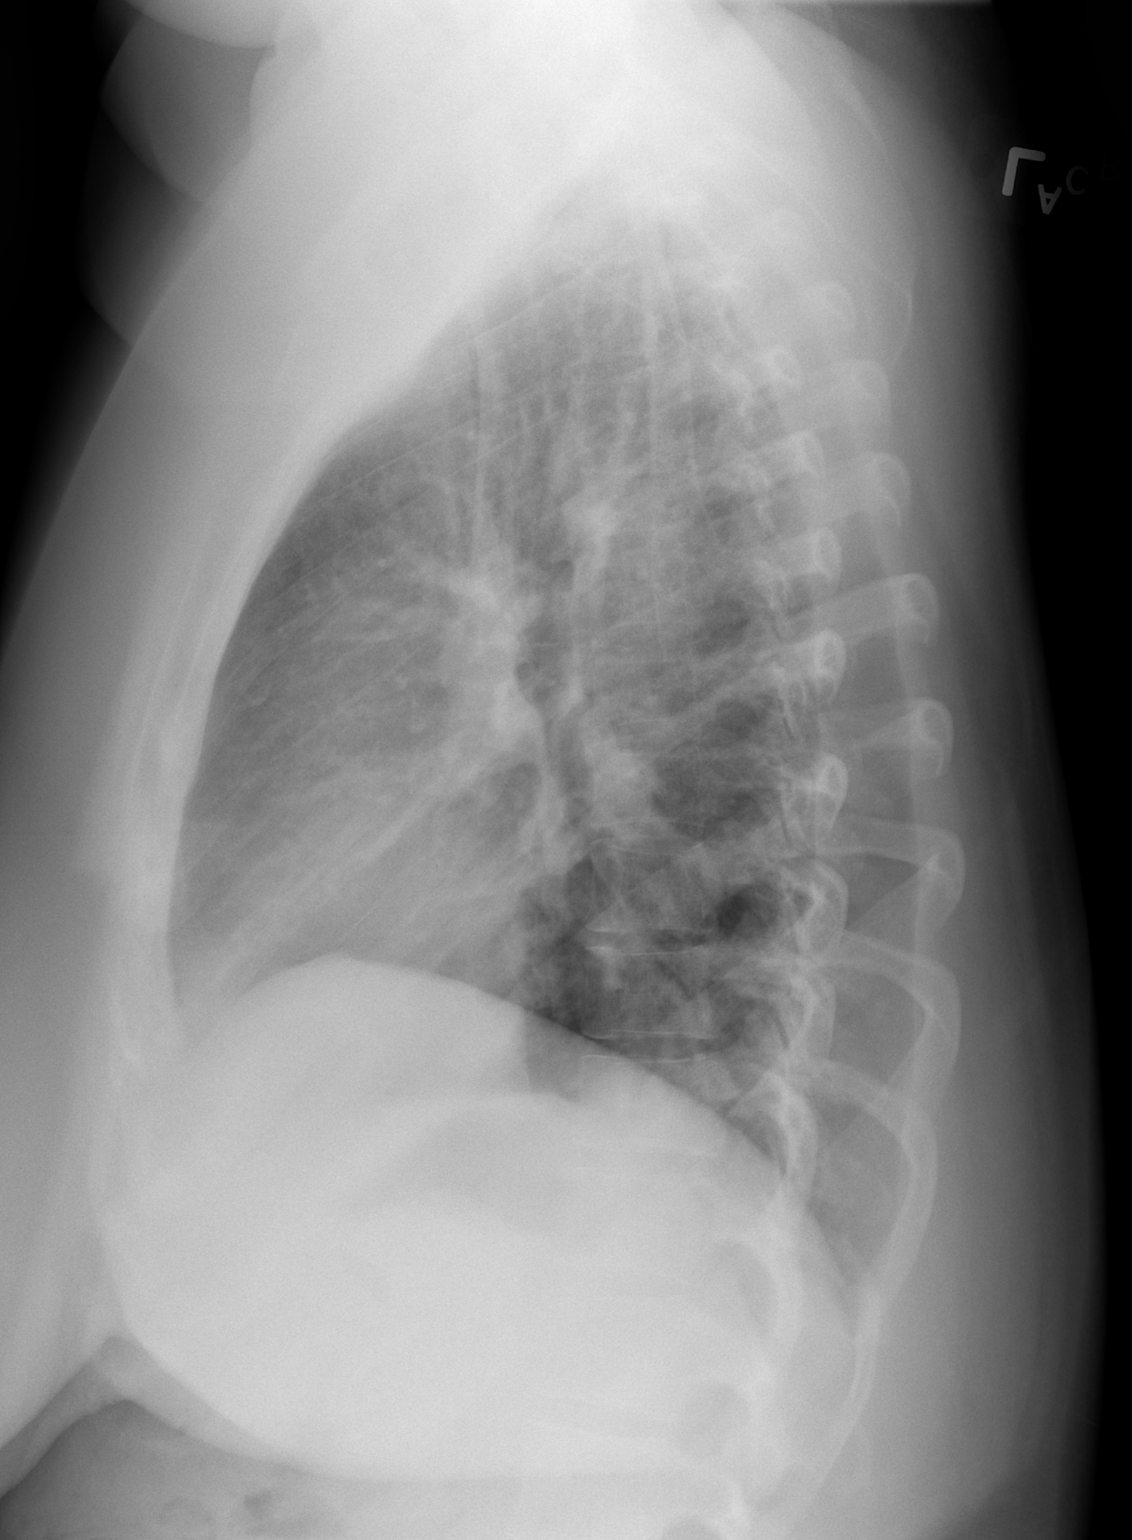

[2 of 2 positions shown; findings below may reference images not displayed]

FINDINGS: Heart size is enlarged.  There is pulmonary vascular
congestion without overt edema.  No focal consolidations or pleural
effusions are identified.
IMPRESSION: Cardiomegaly without evidence for acute pulmonary abnormality.

## 2010-10-29 LAB — URINE CULTURE: Colony Count: >=100000

## 2010-10-29 LAB — POCT URINALYSIS DIP (DEVICE)
Glucose, UA: NEGATIVE
Hgb urine dipstick: NEGATIVE
Nitrite: NEGATIVE
Operator id: 116391
Protein, ur: 30 — AB
Protein, ur: NEGATIVE
Urobilinogen, UA: 0.2
Urobilinogen, UA: 0.2
pH: 5.5

## 2010-10-29 LAB — POCT PREGNANCY, URINE
Operator id: 239701
Preg Test, Ur: NEGATIVE

## 2010-11-03 ENCOUNTER — Ambulatory Visit: Payer: Self-pay

## 2010-11-04 ENCOUNTER — Ambulatory Visit: Payer: Self-pay

## 2010-11-05 LAB — POCT URINALYSIS DIP (DEVICE)
Bilirubin Urine: NEGATIVE
Glucose, UA: NEGATIVE
Nitrite: NEGATIVE
Operator id: 235561
pH: 6.5

## 2010-11-05 LAB — POCT PREGNANCY, URINE
Operator id: 235561
Preg Test, Ur: NEGATIVE

## 2010-11-09 LAB — POCT URINALYSIS DIP (DEVICE)
Bilirubin Urine: NEGATIVE
Nitrite: NEGATIVE
Operator id: 282151
pH: 7

## 2010-11-10 LAB — POCT I-STAT, CHEM 8
BUN: 4 — ABNORMAL LOW
Calcium, Ion: 1.22 mmol/L (ref 1.12–1.32)
Calcium, Ion: 1.13
Calcium, Ion: 1.19
Calcium, Ion: 1.2
Chloride: 102 mEq/L (ref 96–112)
Chloride: 104
Creatinine, Ser: 1
Creatinine, Ser: 1.1
Glucose, Bld: 102 — ABNORMAL HIGH
Glucose, Bld: 105 — ABNORMAL HIGH
Glucose, Bld: 106 — ABNORMAL HIGH
HCT: 43 % (ref 36.0–46.0)
HCT: 40
HCT: 47 — ABNORMAL HIGH
Hemoglobin: 13.6
Sodium: 139
TCO2: 25 mmol/L (ref 0–100)
TCO2: 21

## 2010-11-10 LAB — URINE MICROSCOPIC-ADD ON

## 2010-11-10 LAB — URINALYSIS, ROUTINE W REFLEX MICROSCOPIC
Nitrite: NEGATIVE
Specific Gravity, Urine: 1.038 — ABNORMAL HIGH
pH: 6

## 2010-11-10 LAB — CBC
HCT: 38.6
Platelets: 282
WBC: 5.6

## 2010-11-10 LAB — DIFFERENTIAL
Lymphocytes Relative: 28
Lymphs Abs: 1.6
Neutrophils Relative %: 61

## 2010-11-10 LAB — POCT PREGNANCY, URINE: Preg Test, Ur: NEGATIVE

## 2010-11-25 ENCOUNTER — Ambulatory Visit (INDEPENDENT_AMBULATORY_CARE_PROVIDER_SITE_OTHER): Payer: Self-pay | Admitting: Family Medicine

## 2010-11-25 ENCOUNTER — Encounter: Payer: Self-pay | Admitting: Family Medicine

## 2010-11-25 VITALS — BP 157/91 | HR 73 | Temp 98.2°F | Ht 62.0 in | Wt 244.0 lb

## 2010-11-25 DIAGNOSIS — K219 Gastro-esophageal reflux disease without esophagitis: Secondary | ICD-10-CM

## 2010-11-25 DIAGNOSIS — R300 Dysuria: Secondary | ICD-10-CM

## 2010-11-25 DIAGNOSIS — N390 Urinary tract infection, site not specified: Secondary | ICD-10-CM | POA: Insufficient documentation

## 2010-11-25 DIAGNOSIS — Z7251 High risk heterosexual behavior: Secondary | ICD-10-CM | POA: Insufficient documentation

## 2010-11-25 LAB — POCT URINALYSIS DIPSTICK
Blood, UA: NEGATIVE
Ketones, UA: NEGATIVE
Protein, UA: NEGATIVE
Spec Grav, UA: >=1.03

## 2010-11-25 LAB — RPR

## 2010-11-25 MED ORDER — OMEPRAZOLE 20 MG PO CPDR: 20.0000 mg | DELAYED_RELEASE_CAPSULE | Freq: Every day | ORAL | Status: DC

## 2010-11-25 MED ORDER — CEPHALEXIN 500 MG PO CAPS: 500.0000 mg | ORAL_CAPSULE | Freq: Two times a day (BID) | ORAL | Status: AC

## 2010-11-25 NOTE — Progress Notes (Signed)
  Subjective:    Patient ID: Kendra Harris, female    DOB: [birth year], 30 y.o.   MRN: [id number]  HPI Patient is coming in with multiple problems. Patient told though that this is a work in appointment and has to only do some Patient is a new sexual partner is not using condoms would like an HIV test does not have any vaginal discharge and is not concerned for that but would like any blood tests are available Patient also states that she has some back pain associated with back pain she had a urinary tract infection she has had some mild increased urinary frequency but denies any type hematuria or any pain or any urgency with urination Patient also states that during the night when she's laying down she's having some burning in her esophagus and mouth. She had this longer go has never taken medications for it but notices now it's keeping her up at night noticed some association with food no fevers or chills some nausea but no vomiting   Review of Systems As stated above patient also denies any chest pain or shortness of breath    Objective:   Physical Exam General: No apparent distress obese female HEENT pupils equal round reactive to light and accommodation poor dentition Cardiovascular regular rate and rhythm no murmur Pulmonary lungs clear to auscultation bilaterally Abdomen patient does have some midepigastric tenderness no organomegaly no masses palpated patient has very minimal suprapubic tenderness .    Assessment & Plan:

## 2010-11-25 NOTE — Patient Instructions (Addendum)
Nice to meet you. I think you do have some reflux disease we will try a medicine called omeprazole take one pill daily For your face I would try to use Eucerin cream or  Aveeno cream on her face 2-3 times daily I will give you a medication for your urinary tract infection as well take one pill twice a day for 3 days

## 2010-11-25 NOTE — Assessment & Plan Note (Signed)
Patient does present with clinical symptoms of a possible UTI urinalysis positive for leukocyte esterases will treat with Keflex at this time

## 2010-11-25 NOTE — Assessment & Plan Note (Signed)
Patient has a new partner we will do HIV and RPR patient declined pelvic exam will come back with PCP followup

## 2010-11-25 NOTE — Assessment & Plan Note (Signed)
Patient has a history and clinical findings for the potential for gastroesophageal reflux disease at this time we will start a low dose PPI and see how patient does followup in one month with PCP

## 2010-12-07 ENCOUNTER — Encounter: Payer: Self-pay | Admitting: Emergency Medicine

## 2010-12-15 ENCOUNTER — Ambulatory Visit (INDEPENDENT_AMBULATORY_CARE_PROVIDER_SITE_OTHER): Payer: Self-pay | Admitting: Family Medicine

## 2010-12-15 ENCOUNTER — Encounter: Payer: Self-pay | Admitting: Family Medicine

## 2010-12-15 DIAGNOSIS — K0889 Other specified disorders of teeth and supporting structures: Secondary | ICD-10-CM

## 2010-12-15 DIAGNOSIS — K089 Disorder of teeth and supporting structures, unspecified: Secondary | ICD-10-CM

## 2010-12-15 DIAGNOSIS — I10 Essential (primary) hypertension: Secondary | ICD-10-CM

## 2010-12-15 DIAGNOSIS — G43909 Migraine, unspecified, not intractable, without status migrainosus: Secondary | ICD-10-CM

## 2010-12-15 MED ORDER — PROPRANOLOL HCL 40 MG PO TABS: 40.0000 mg | ORAL_TABLET | Freq: Two times a day (BID) | ORAL | Status: DC

## 2010-12-15 NOTE — Assessment & Plan Note (Signed)
Blood pressure elevated today. Patient is now any blood pressure medications. We'll start propranolol 40 mg for both blood pressure and migraine prophylaxis. Protonix discussed with patient. She is to return in 2-4 weeks for blood pressure recheck.   BP Readings from Last 3 Encounters:  12/15/10 160/90  11/25/10 157/91  09/18/10 144/89

## 2010-12-15 NOTE — Patient Instructions (Signed)
It was great to see you today. I'm sorry that your having problems with headaches and your tooth. For your headaches and blood pressure, I'm going to start you on a new medicine called propranolol. This should help both control your blood pressure and help to prevent migraines. We'll start you on a lower dose so that we don't dropped your blood pressure too low to fast. It is on the $4 list at [facility]. For the new medicine, take one tablet 2 times a day. You should come back and see your regular doctor, Dr. Elwyn Reach, in 2-4 weeks to see how your blood pressure is doing. At that time, she will likely increase your dose to 80 mg a day. For your tooth pain, I do not think that it needs antibiotics yet. It does not look like you have a big abscess and you have not had any fevers.  There is a free dental clinic at the [facility] next weekend. You should carmine for the specifics of this, but I think that it would be a very good idea to go and try to be seen there. If you start having fevers, chills, swelling on the side of your face, or a significant increase in pain around that tooth, please come back and be seen at the clinic sooner so that we can start antibiotics.  Please come back and see your regular doctor in 2-4 weeks.

## 2010-12-15 NOTE — Assessment & Plan Note (Signed)
No obvious abscess. No fevers or chills. No need for antibiotics at this time. Appears to be left back lower tooth. Encourage patient to try to make it to free dental clinic at [facility] next weekend. Advised patient that she needs to be seen by dentist. Red flags for infection/abscess discussed. Encourage patient to return with any of the symptoms so that antibiotic treatment can be initiated.

## 2010-12-15 NOTE — Progress Notes (Signed)
S: Pt comes in today for SDA.  MIGRAINES Patient has been having headaches 2-3 times a week for the past one month. It is associated with phonophobia and nausea. No vomiting or photophobia. No numbness or tingling. Headache is located on the top and back of her head. She states it is a throbbing and tingling-like feeling. She has to lay down when she has these. Laying down seems to help some. Tylenol also helps a little however, time is the only thing that makes the headache go away entirely. She currently has a headache which is 3/10. Rates her migraines, at their worst, to be 7/10. Patient states that she was on medications in the past to help prevent these. She  had been doing very well and had not had any migraines for approximately 3-4 months prior to them restarting approximately one month ago. Patient states that the pain and symptoms are similar to her migraines previously.  DENTAL PAIN Patient notice pain over her left bottom jaw for the past 2 days. She first noticed it when she was brushing her teeth. She's also been having pain with chewing on her left side. She states she thought she had some facial swelling on the left side yesterday and this morning. She has not had any fevers or chills. She has not noticed any pus or exudate in her mouth. She states she last all dentist approximately one year ago and has been trying to get back in to see a dentist. However, she's been waiting undermedicated has been unable to see anyone. She only has pain when eating or doing something that touches her tooth. Pain is localized to the left bottom jaw although in the back. She's not having any upper tooth pain or pain on her right side. Swelling was minimal.  ROS: Per HPI  History  Smoking status  . Never Smoker   Smokeless tobacco  . Not on file    O:  Filed Vitals:   12/15/10 1031  BP: 160/90  Pulse: 88    Gen: NAD HEENT: MMM, EOMI, PERRLA, no papilledema, no cervical LAD, no tenderness to  outside of jaw bilaterally; very poor dentition throughout, no obvious abscess or frank exudate, moderate TTP over left bottom back molar, mild surrounding erythema at tooth insertion w/o pus, no swelling (inside mouth or over jaw line) CV: RRR, no murmur Pulm: CTA bilat, no wheezes or crackles Abd: soft, NT Ext: Warm, no chronic skin changes, no edema   A/P: 30 y.o. female p/w migraine headache, dental pain -See problem list -f/u in 2-4 weeks

## 2010-12-15 NOTE — Assessment & Plan Note (Signed)
Having headaches 2-3 times per week. Has a history of migraines. Pain gets up to 7/10. We'll start propranolol for both blood pressure and migraine prophylaxis. We'll start at 40 mg, likely will need increased to 80 mg if blood pressure tolerates.

## 2010-12-22 ENCOUNTER — Ambulatory Visit: Payer: Self-pay

## 2011-03-04 ENCOUNTER — Ambulatory Visit (INDEPENDENT_AMBULATORY_CARE_PROVIDER_SITE_OTHER): Payer: Self-pay | Admitting: Family Medicine

## 2011-03-04 ENCOUNTER — Encounter: Payer: Self-pay | Admitting: Family Medicine

## 2011-03-04 ENCOUNTER — Telehealth: Payer: Self-pay | Admitting: Emergency Medicine

## 2011-03-04 VITALS — BP 178/106 | HR 71 | Temp 97.8°F | Ht 62.0 in | Wt 248.0 lb

## 2011-03-04 DIAGNOSIS — N390 Urinary tract infection, site not specified: Secondary | ICD-10-CM

## 2011-03-04 DIAGNOSIS — A5901 Trichomonal vulvovaginitis: Secondary | ICD-10-CM

## 2011-03-04 DIAGNOSIS — R300 Dysuria: Secondary | ICD-10-CM

## 2011-03-04 DIAGNOSIS — I10 Essential (primary) hypertension: Secondary | ICD-10-CM

## 2011-03-04 DIAGNOSIS — G43909 Migraine, unspecified, not intractable, without status migrainosus: Secondary | ICD-10-CM

## 2011-03-04 DIAGNOSIS — N912 Amenorrhea, unspecified: Secondary | ICD-10-CM

## 2011-03-04 DIAGNOSIS — A599 Trichomoniasis, unspecified: Secondary | ICD-10-CM | POA: Insufficient documentation

## 2011-03-04 HISTORY — DX: Urinary tract infection, site not specified: N39.0

## 2011-03-04 LAB — POCT URINALYSIS DIPSTICK
Glucose, UA: NEGATIVE
Nitrite, UA: NEGATIVE
Spec Grav, UA: >=1.03
Urobilinogen, UA: 0.2

## 2011-03-04 LAB — POCT URINE PREGNANCY: Preg Test, Ur: NEGATIVE

## 2011-03-04 LAB — POCT UA - MICROSCOPIC ONLY

## 2011-03-04 MED ORDER — METRONIDAZOLE 500 MG PO TABS: 2000.0000 mg | ORAL_TABLET | Freq: Three times a day (TID) | ORAL | Status: AC

## 2011-03-04 MED ORDER — PROPRANOLOL HCL 40 MG PO TABS: 40.0000 mg | ORAL_TABLET | Freq: Two times a day (BID) | ORAL | Status: DC

## 2011-03-04 MED ORDER — CEPHALEXIN 500 MG PO TABS: 500.0000 mg | ORAL_TABLET | Freq: Four times a day (QID) | ORAL | Status: AC

## 2011-03-04 NOTE — Telephone Encounter (Signed)
Patient has gone to the pharmacy to pick up 3 Rx's, but the one for Flagyl was not there.

## 2011-03-04 NOTE — Telephone Encounter (Signed)
Patient also did not get the Inderal and needs that sent to [facility] on [address].

## 2011-03-04 NOTE — Assessment & Plan Note (Signed)
BP: 178/106 mmHg   Patient not currently taking propranolol. Patient given refills for prescriptions on. She is to return to clinic to see PCP to monitor control of blood pressure.

## 2011-03-04 NOTE — Telephone Encounter (Signed)
Rx was attempted to be sent but  "print " was clicked instead of normal.  Called pharmacy and rx was verbally given to pharmacist. They  also did receive RX for Flagel and patient has picked up according to pharmacist. Attempted calling patient to let her know but message on phone # listed states invalid.

## 2011-03-04 NOTE — Progress Notes (Signed)
  Subjective:    Patient ID: Kendra Harris, female    DOB: [birth year], 31 y.o.   MRN: [id number]  HPI BACK PAIN  Location: left lower back Quality: aching Onset: 3 days ago Worse with: nothint Better with: nothing Radiation: none Trauma: no Best sitting/standing/leaning forward: no change  Red Flags Fecal/urinary incontinence: no  Numbness/Weakness: no  Fever/chills/sweats: no  Night pain: no  Unexplained weight loss: no  No relief with bedrest: yes, pain persists at night  h/o cancer/immunosuppression: no  IV drug use: no  PMH of osteoporosis or chronic steroid use: no Prior back problems/diagnostic or therapies: None LMP 01/22/11 Not taking propranolol Sexaully active Does not smoke  (+) suprapubic pressure and dysuria for 3 days. Hx of UTIs- Feels like UTIs she has had in past.      Review of Systems See HPI     Objective:   Physical Exam Vital signs noted.  General: Well groomed, adult female no apparent distress, cooperative Back: Tenderness above the left iiliac crests spinous process. No limitations in lumbar rotation or flexion, extension. No tenderness over SI joint. No tenderness along sciatic notch. Knee and ankle deep tendon reflexes 2+ bilaterally symmetric. Straight leg raise negative. Able to go up on toes and back on heels without problems. FABER sign negative. No pain with pelvic rock area internal hip rotation without discomfort.  No bilateral costal angle tenderness.   Abdomen soft, nontender, positive bowel sounds. Mild suprapubic tenderness        Assessment & Plan:

## 2011-03-04 NOTE — Patient Instructions (Addendum)
It looks like you have a bladder infection. Will recommend taking the antibiotic Cephalexin four times a day for next 7 days. If you are not better in three days or if you worsen, then please let our office know.  You have a a Trichomas vaginal infection.  This is a sexually transmitted infection.  You will want to inform any partners of this infection.  They should see their healthcare providers for treatment.  Recommend avoiding sexual relations for one week after treatment completion.

## 2011-03-04 NOTE — Assessment & Plan Note (Addendum)
Trichomonas on urine microscopy, identified. May be origin of the pyuria.  Plan Flagyl 500 mg tablets, 4 tablets, one time. Abstinence from sexual relations for one week after treatment. Use of condoms recommended. Recommendation that patient inform partner(s) so that they can seek health care and treatment. Patient should not resume relations with her untreated partner(s) for one week after their treatment.

## 2011-03-04 NOTE — Assessment & Plan Note (Signed)
Urine dipstick shows positive for WBC's and positive for leukocytes.  Micro exam: 10-20 WBC's per HPF. (+) Trich. Urine preg negative. Possible uncomplicated UTI. Plan cephalexin 500 mg by mouth 4 times a day for 7 days.

## 2011-03-12 ENCOUNTER — Ambulatory Visit (INDEPENDENT_AMBULATORY_CARE_PROVIDER_SITE_OTHER): Payer: Self-pay | Admitting: Sports Medicine

## 2011-03-12 VITALS — BP 138/102 | HR 68 | Temp 98.2°F | Ht 62.0 in | Wt 241.6 lb

## 2011-03-12 DIAGNOSIS — M25469 Effusion, unspecified knee: Secondary | ICD-10-CM

## 2011-03-12 DIAGNOSIS — M25461 Effusion, right knee: Secondary | ICD-10-CM | POA: Insufficient documentation

## 2011-03-12 NOTE — Assessment & Plan Note (Signed)
See AVS for symptomatic treatment.  Drainage and injection offered but pt deferred.  No further imaging at this time as pt able to bear weight immediately following injury and currently with delayed swelling.  Pt provided red flags and told to return for further imaging if worsening symptoms.

## 2011-03-12 NOTE — Progress Notes (Signed)
SUBJECTIVE: Kendra Harris is a 31 y.o. female who sustained a right knee injury 1 week(s) ago. Mechanism of injury: fall on steps with black ice. Immediate symptoms: immediate pain, delayed swelling, was able to bear weight directly after injury, no deformity was noted by the patient. Symptoms have been constant but improving steadily since that time. Prior history of related problems: no prior problems with this area in the past.   Pt does report additional L forearm pain that began prior to her fall and describes these pains as being similar.  No numbness or tingling in either location.   Using rubbing alcohol to help with swelling.  No other OTCs or ice.  Has been using a single crutch to help her walk for stablity.  OBJECTIVE: Vital signs as noted. Appearance: alert, well appearing, and in no distress. Knee exam: antalgic gait, RIGHT: + ballotable effusion, negative drawer sign, collateral ligaments intact, negative McMurray sign, negative Lachman sign, normal contralateral knee exam. Negative dial test. + Baker's Cyst

## 2011-03-12 NOTE — Patient Instructions (Signed)
It was great to meet you today.  I feel that your knee is swollen but as we discussed we will try symptomatic treatment at this time and hold off on trying to drain the fluid.  Your exam is reassuring that you have not caused any significant damage and we will hold off on further imaging at this time.    Please use ice 3 to 4 times a day for ~15 minutes at a time. Pick up over the counter Aleeve (Naproxen) and take 2 pills every 12 hours for the next week.    Both treatments above will help with your swelling.    If you continue to have issues or the knee becomes more painful or you are unable to stand return to our clinic so we can reassess.

## 2011-05-03 ENCOUNTER — Ambulatory Visit: Payer: Self-pay | Admitting: Emergency Medicine

## 2011-06-25 ENCOUNTER — Ambulatory Visit: Payer: Self-pay | Admitting: Emergency Medicine

## 2011-07-03 IMAGING — US US OB DETAIL+14 WK
2 series · 14 of 28 positions shown · non-contrast
Comparison: none

OBSTETRICAL ULTRASOUND:
 This ultrasound exam was performed in the [HOSPITAL] Ultrasound Department.  The OB US report was generated in the AS system, and faxed to the ordering physician.  This report is also available in [HOSPITAL]?s AccessANYware and in [REDACTED] PACS.

[Series 1: us ob detail +14 wk · 0.12mm/px · 1 of 10 slices shown (1 of 2)]
[im 5/10]
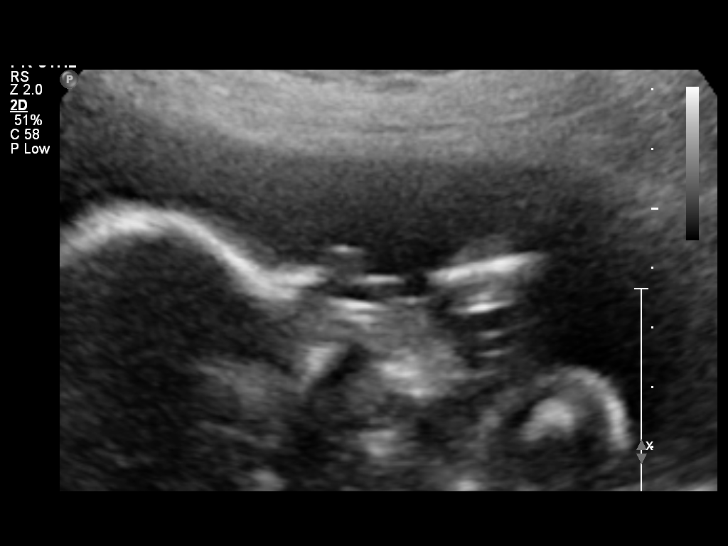

[Series 1: us ob detail +14 wk · 0.20mm/px · 13 of 88 slices shown (2 of 2)]
[im 1/88]
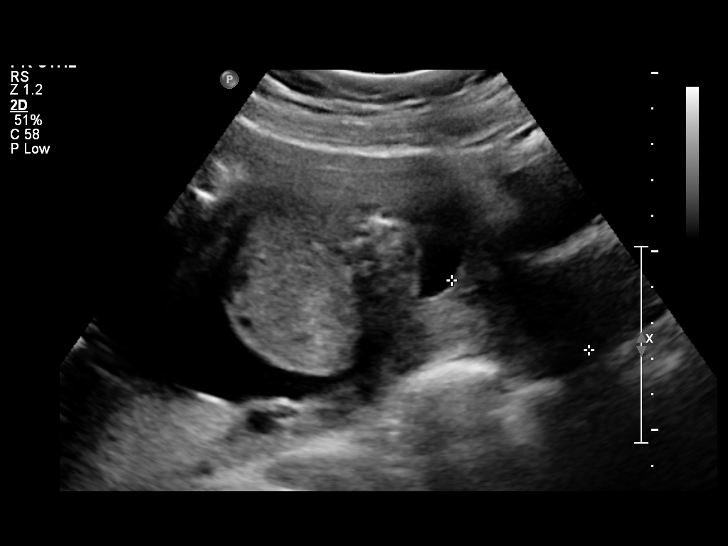
[im 8/88]
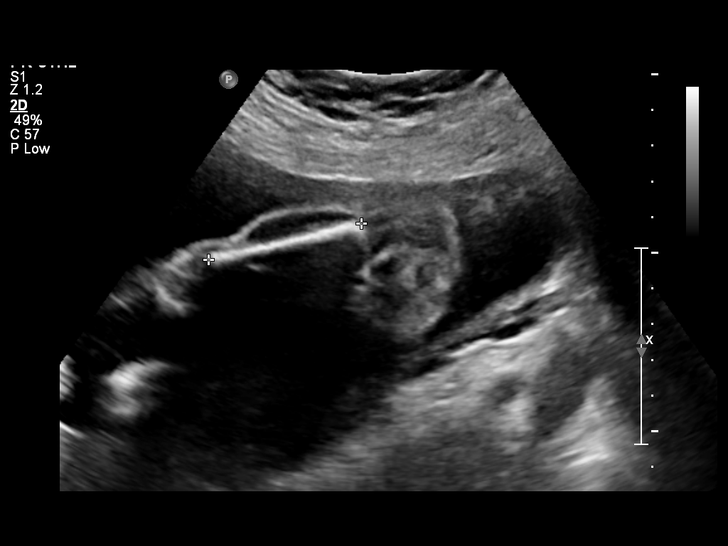
[im 15/88]
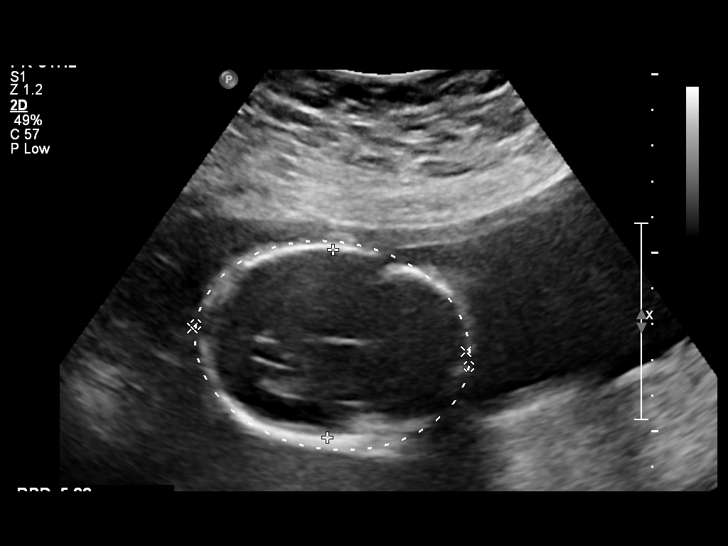
[im 22/88]
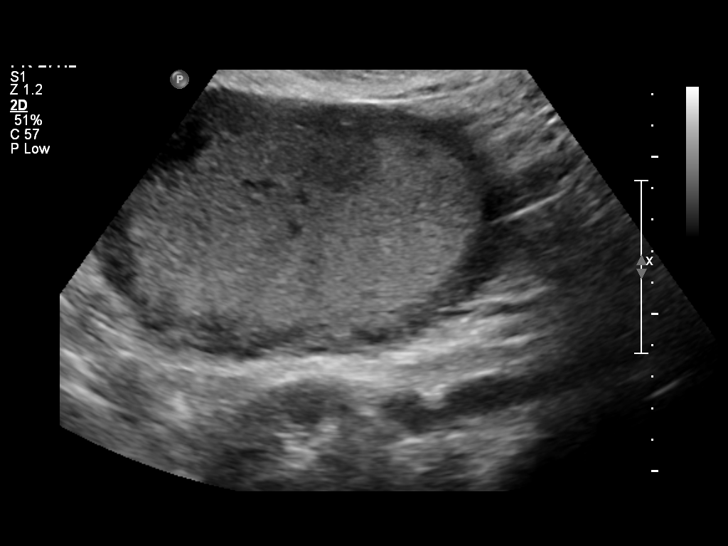
[im 30/88]
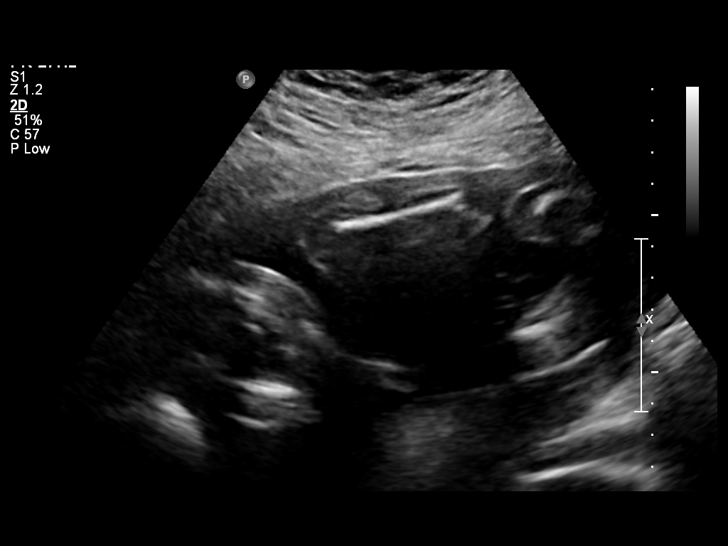
[im 37/88]
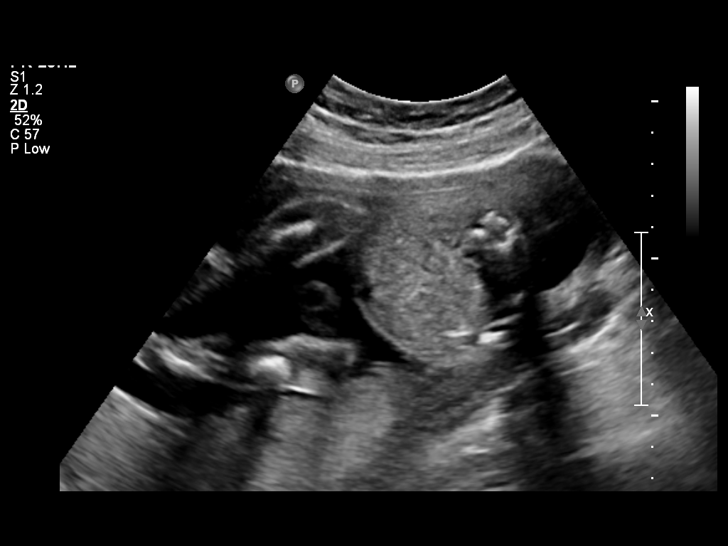
[im 44/88]
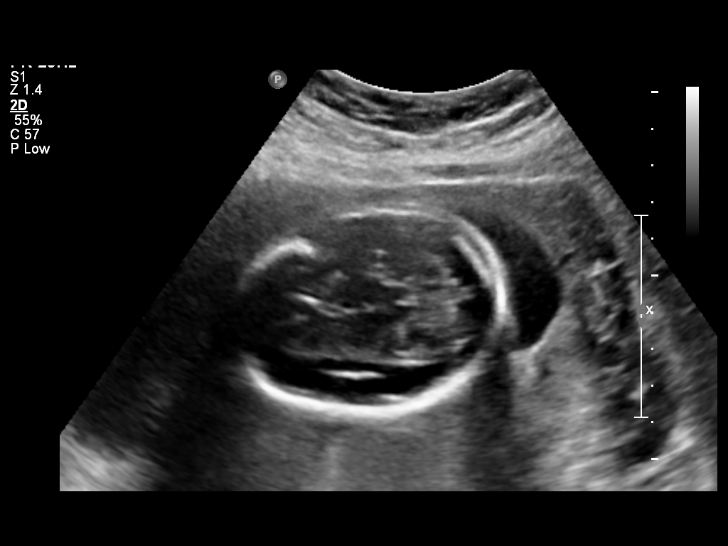
[im 51/88]
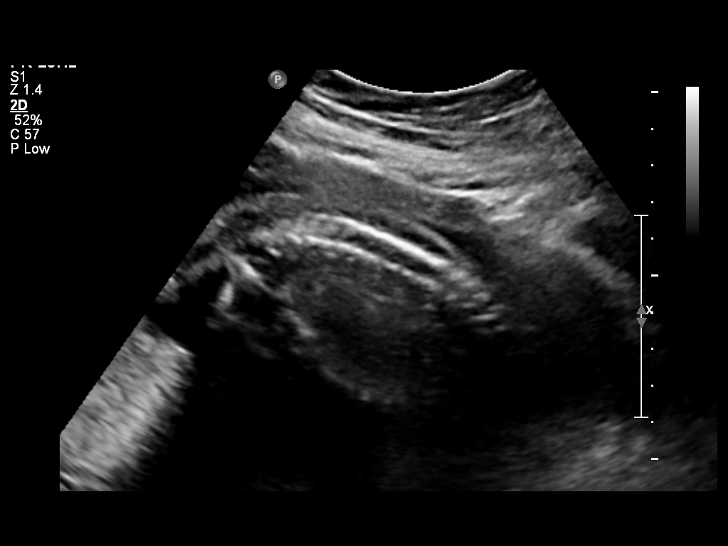
[im 59/88]
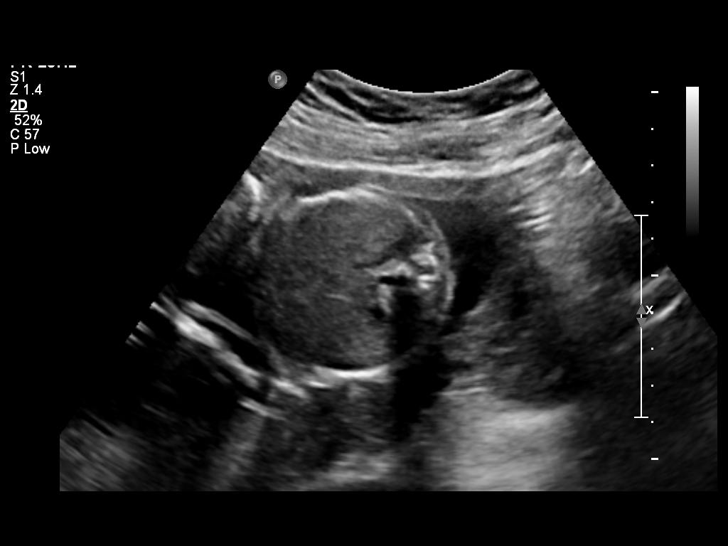
[im 66/88]
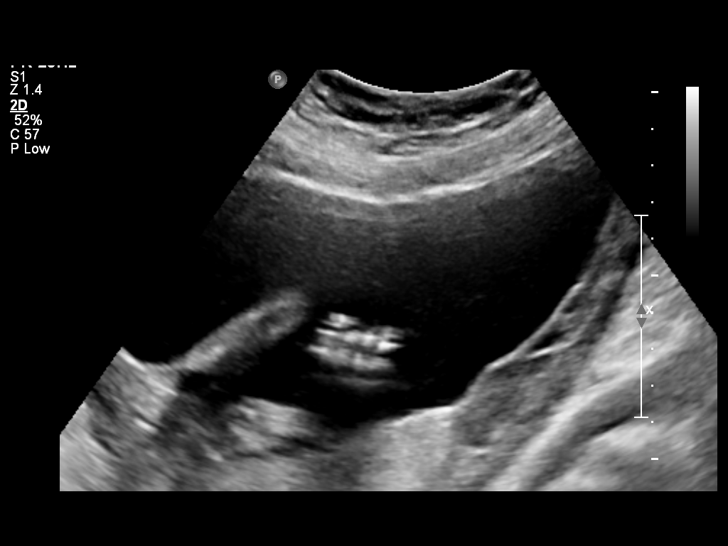
[im 73/88]
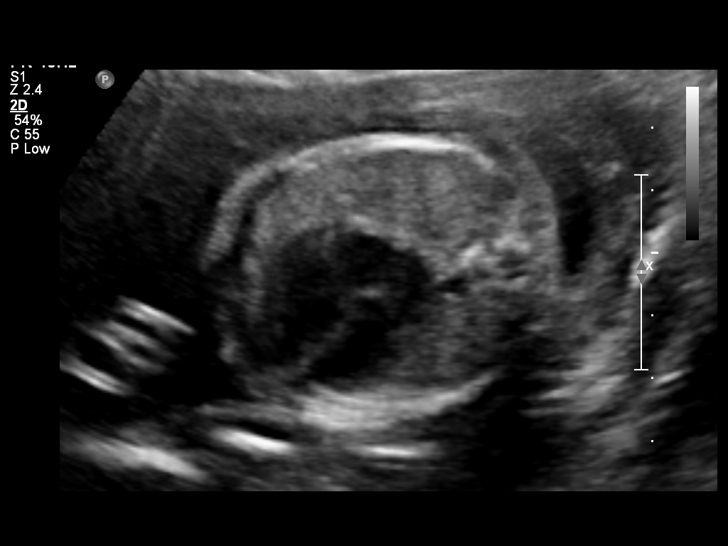
[im 80/88]
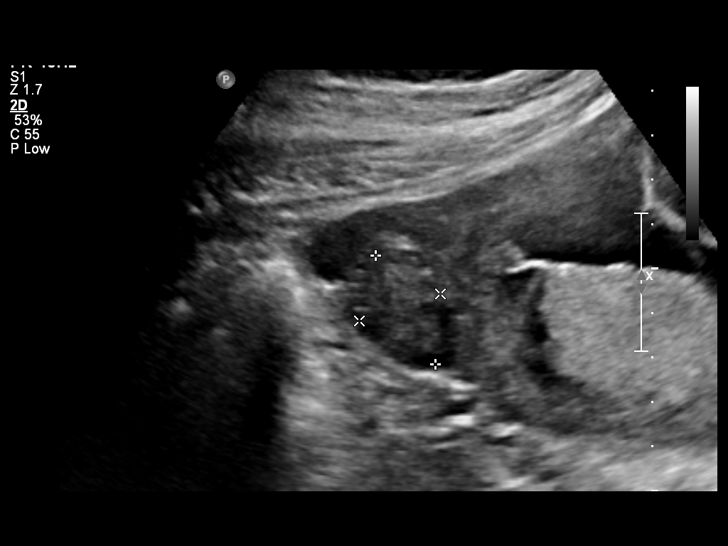
[im 88/88]
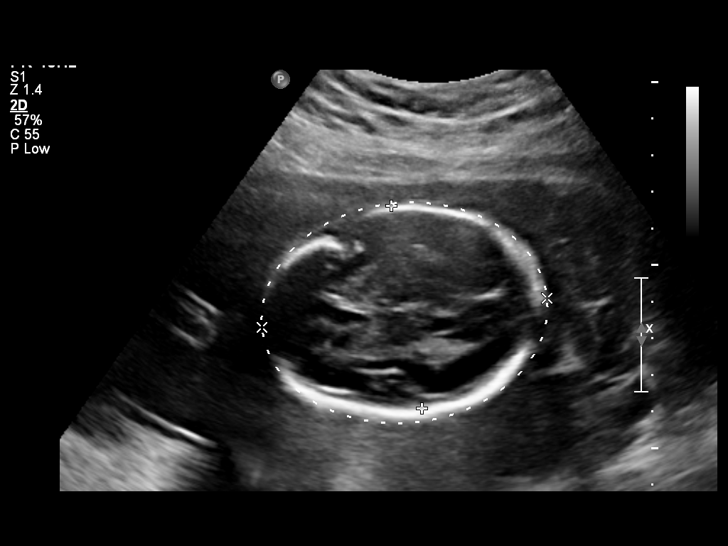

[14 of 28 positions shown; findings below may reference images not displayed]

IMPRESSION: See AS Obstetric US report.

## 2011-07-27 ENCOUNTER — Ambulatory Visit: Payer: Self-pay | Admitting: Family Medicine

## 2011-07-30 ENCOUNTER — Ambulatory Visit (INDEPENDENT_AMBULATORY_CARE_PROVIDER_SITE_OTHER): Payer: Self-pay | Admitting: Family Medicine

## 2011-07-30 ENCOUNTER — Encounter: Payer: Self-pay | Admitting: Family Medicine

## 2011-07-30 VITALS — BP 158/95 | HR 70 | Ht 62.0 in | Wt 253.5 lb

## 2011-07-30 DIAGNOSIS — K5904 Chronic idiopathic constipation: Secondary | ICD-10-CM | POA: Insufficient documentation

## 2011-07-30 DIAGNOSIS — K5909 Other constipation: Secondary | ICD-10-CM

## 2011-07-30 DIAGNOSIS — I10 Essential (primary) hypertension: Secondary | ICD-10-CM

## 2011-07-30 DIAGNOSIS — G43909 Migraine, unspecified, not intractable, without status migrainosus: Secondary | ICD-10-CM

## 2011-07-30 DIAGNOSIS — K219 Gastro-esophageal reflux disease without esophagitis: Secondary | ICD-10-CM

## 2011-07-30 MED ORDER — OMEPRAZOLE 20 MG PO CPDR: 40.0000 mg | DELAYED_RELEASE_CAPSULE | Freq: Every day | ORAL | Status: DC

## 2011-07-30 MED ORDER — SENNA-DOCUSATE SODIUM 8.6-50 MG PO TABS: 1.0000 | ORAL_TABLET | Freq: Two times a day (BID) | ORAL | Status: AC | PRN

## 2011-07-30 MED ORDER — PROPRANOLOL HCL 40 MG PO TABS: 40.0000 mg | ORAL_TABLET | Freq: Two times a day (BID) | ORAL | Status: DC

## 2011-07-30 NOTE — Patient Instructions (Signed)
Dear Mrs. Kendra Harris,   Thank you for coming to clinic today. Please read below regarding the issues that we discussed.   1. Constipation - I have prescribed a medication that you can actually by over the counter called Senna. Take 2 pills a day as needed.   2. Stomach Pain - This sounds like acid reflux. I have prescribed prilosec, which is stronger than Zantac. Please take 1 a day.   3. Headache and Hypertension - Continue propranolol daily. This is on the $4 list at [facility].   Please follow up in clinic in 4 weeks. Please call earlier if you have any questions or concerns.   Sincerely,   Dr. Clinton Sawyer

## 2011-07-30 NOTE — Assessment & Plan Note (Signed)
Patient with persistent GERD symptoms on low dose PPI, so higher dose omeprazole initiated. F/u in 1 month.

## 2011-07-30 NOTE — Progress Notes (Signed)
  Subjective:    Patient ID: Almond Lint, female    DOB: 09/07/1980, 31 y.o.   MRN: [id number]  HPI  31 y.o. Female with obesity, HTN, GERD, and primary headaches who presents with headache and abdominal pain in the setting of running out of prescribed medications.   1. Ab pain and burning sensation - starts centrally and rises towards throat, taking Zantac once daily and did not feel it was improving the symptoms as all; does not take an NSAIDS, only tylenol; notes minimal nausea and no vomiting; pain not related position or eating food, may be minimally improved by drinking water; no diet change recently; denies history of gastric ulcer or hiatal hernia; bowel movements 2-3x a week, denies hematochezia and melena    2. Headache - patient feels that headache related to HTN, was precribed propranolol on 12/15/10 to treat elevated BP and migraines; when taking propranolol denies headache; current headache she describes as a "migraine" which started last Wednesday and is characterezed by tingling in occipital region that traveled to the front, no nausea vomting or photophbia  Review of Systems Negative unless stated in HPI    Objective:   Physical Exam  BP 158/95  Pulse 70  Ht 5\' 2"  (1.575 m)  Wt 253 lb 8 oz (114.987 kg)  BMI 46.37 kg/m2  LMP 07/29/2011 Gen: young AAF, obese, alert, oriented, very pleasant, non distressed Neuro: EOMI, PERRLA, CN III-XII grossly intact Abd: obese, soft, NDNT, negative Murphy's sign  CV: RRR, no murmurs Pulm: CTA-B      Assessment & Plan:  31 year old female with obesity,GERD, HTN and headaches related to hypertension that have returned since running out of her prescription medication.

## 2011-07-30 NOTE — Assessment & Plan Note (Signed)
BP Readings from Last 3 Encounters:  07/30/11 158/95  03/12/11 138/102  03/04/11 178/106   Patient restarted on propranolol. Will likely need a second agent in the future, but will defer to PCP.

## 2011-07-30 NOTE — Assessment & Plan Note (Signed)
Start senna PRN as miralax no helpful. Consider further work-up if Senna not effective.

## 2011-08-31 IMAGING — US US FETAL BPP W/O NONSTRESS
1 series · 13 of 13 positions shown · non-contrast
Comparison: none

OBSTETRICAL ULTRASOUND:
 This ultrasound exam was performed in the [HOSPITAL] Ultrasound Department.  The OB US report was generated in the AS system, and faxed to the ordering physician.  This report is also available in [HOSPITAL]?s AccessANYware and in [REDACTED] PACS.

[Series 1: us fetal bpp w/o nonstress · 0.24mm/px · 13 acquisitions, 13 frames shown]
[im 1/13]
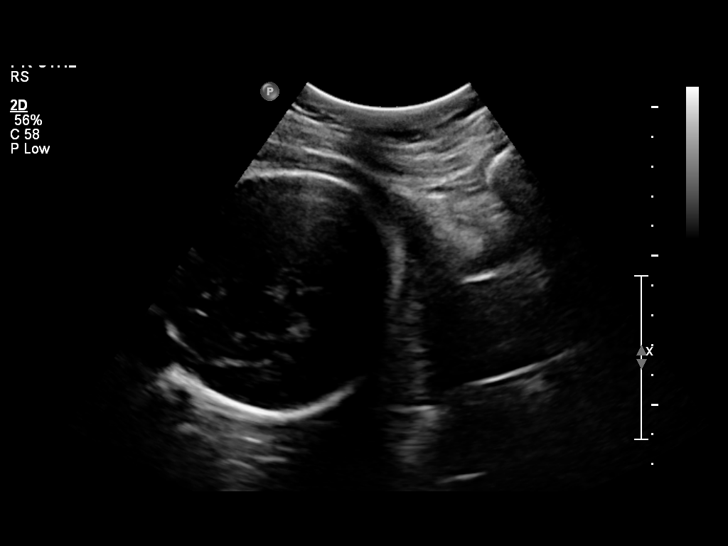
[im 2/13]
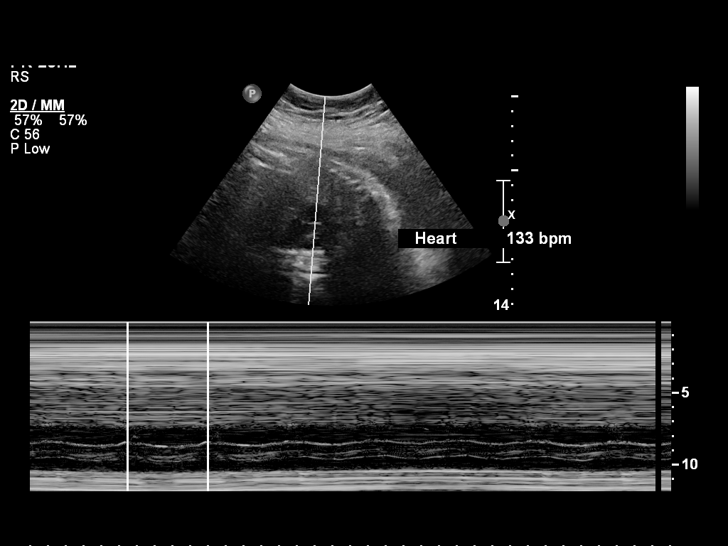
[im 3/13]
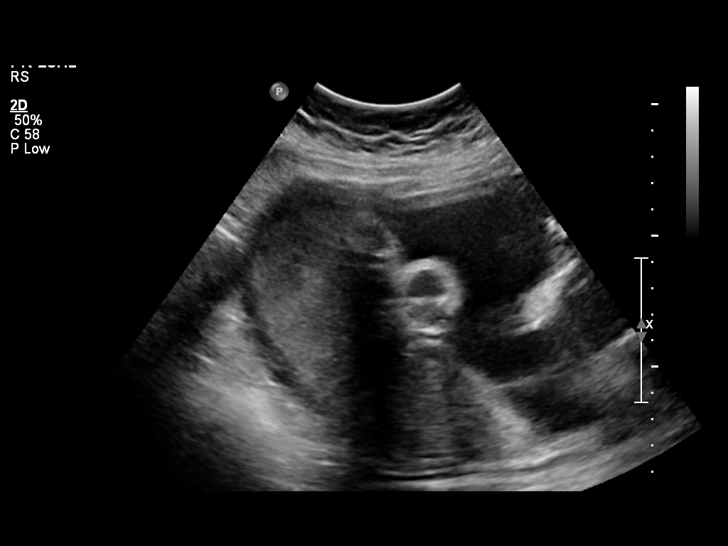
[im 4/13]
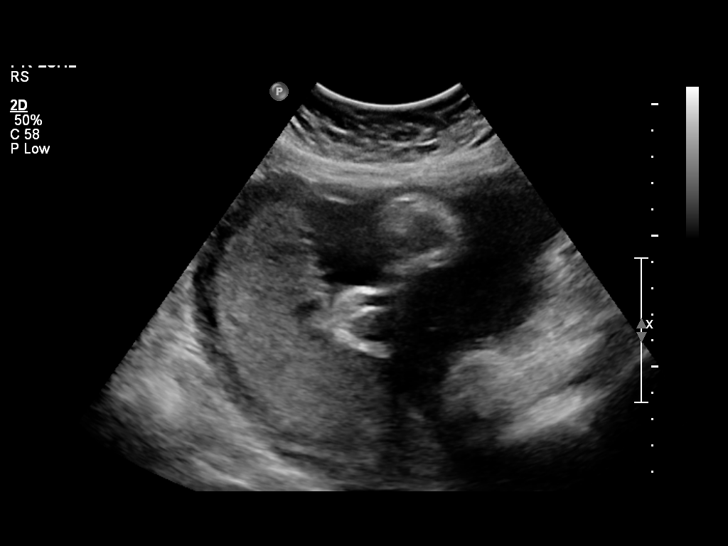
[im 5/13]
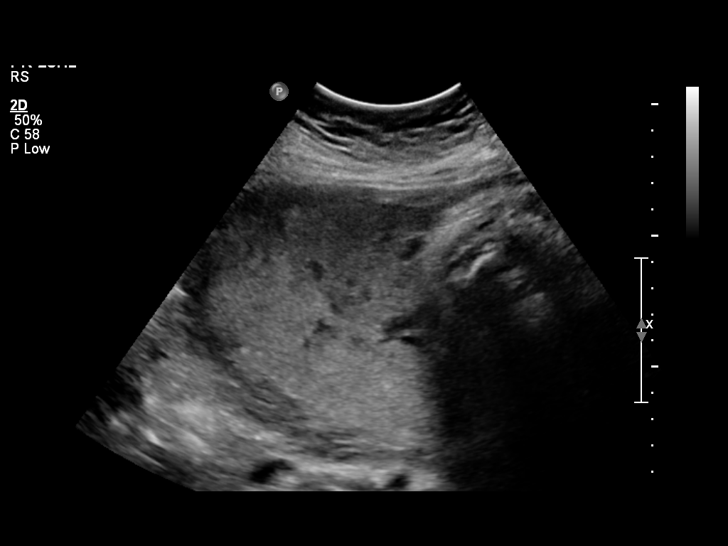
[im 6/13]
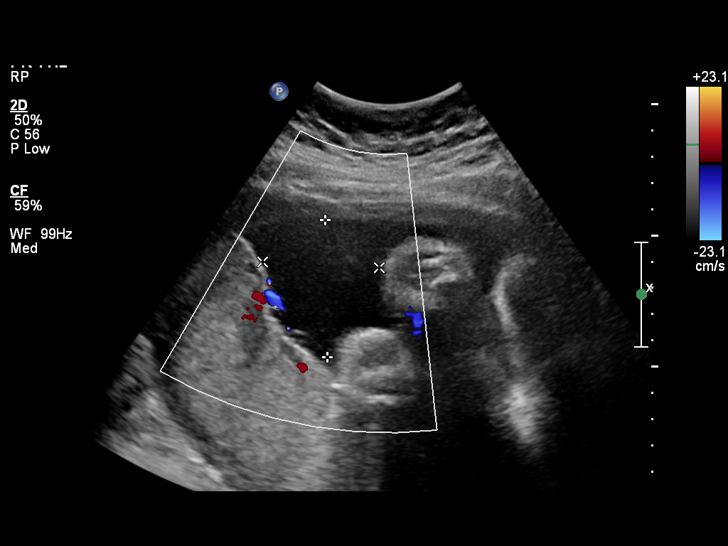
[im 7/13]
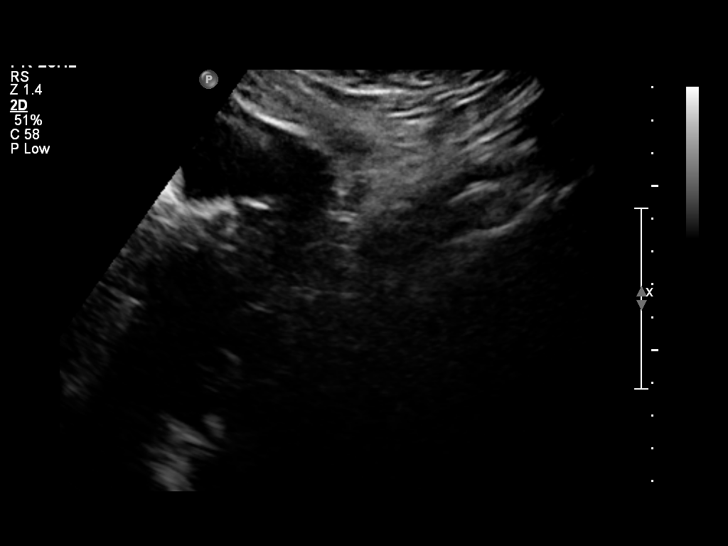
[im 8/13]
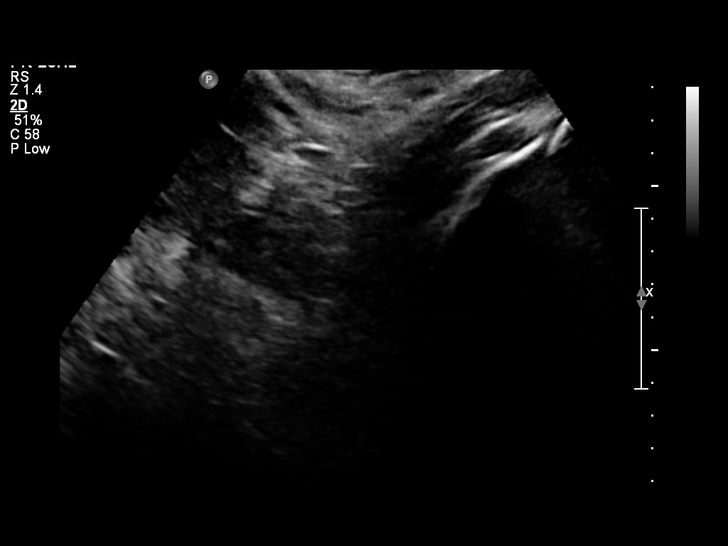
[im 9/13]
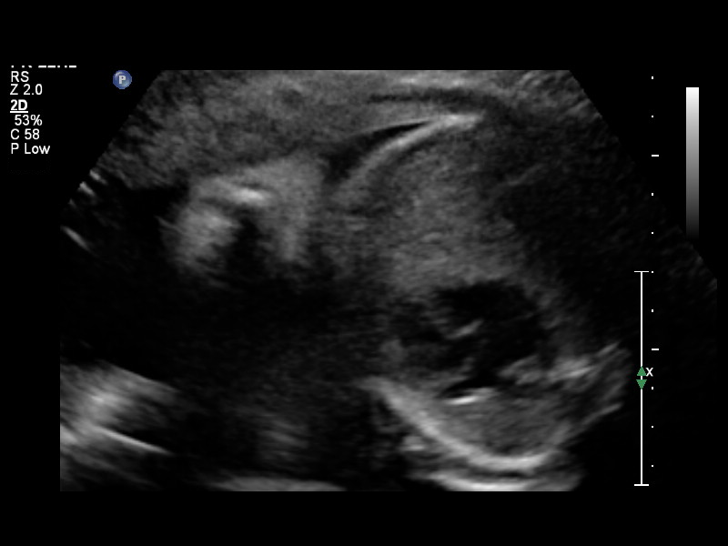
[im 10/13]
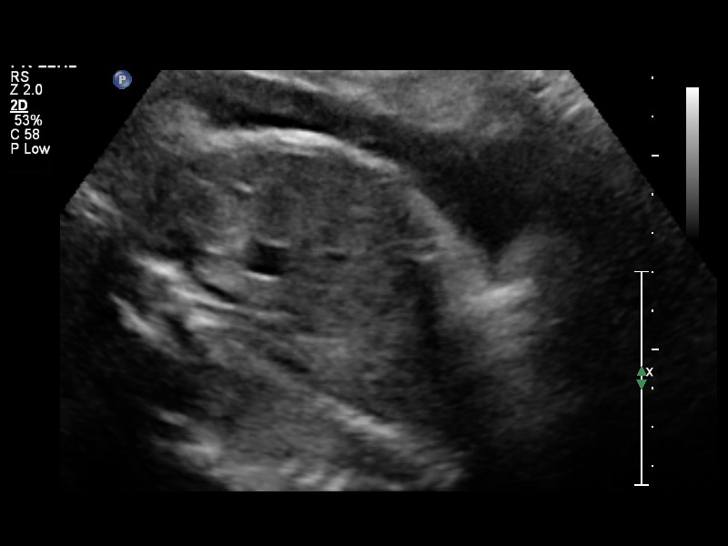
[im 11/13]
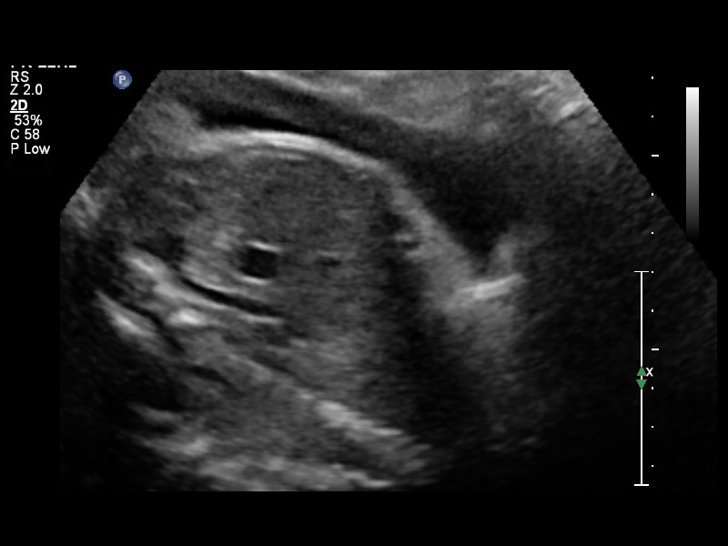
[im 12/13]
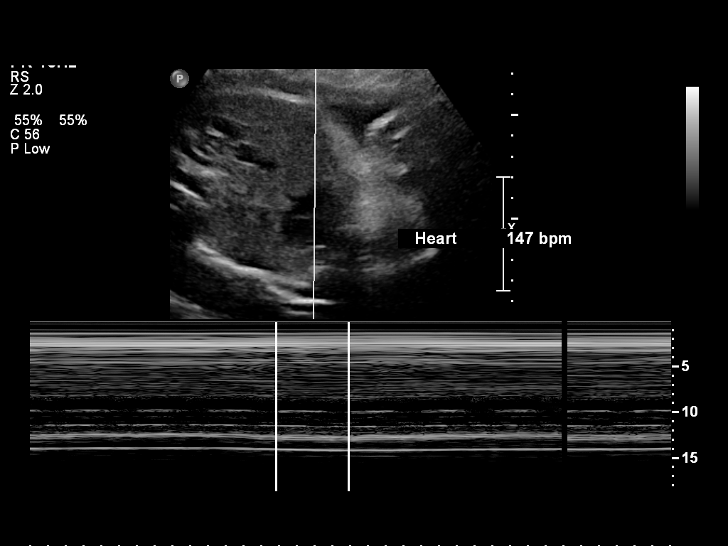
[im 13/13]
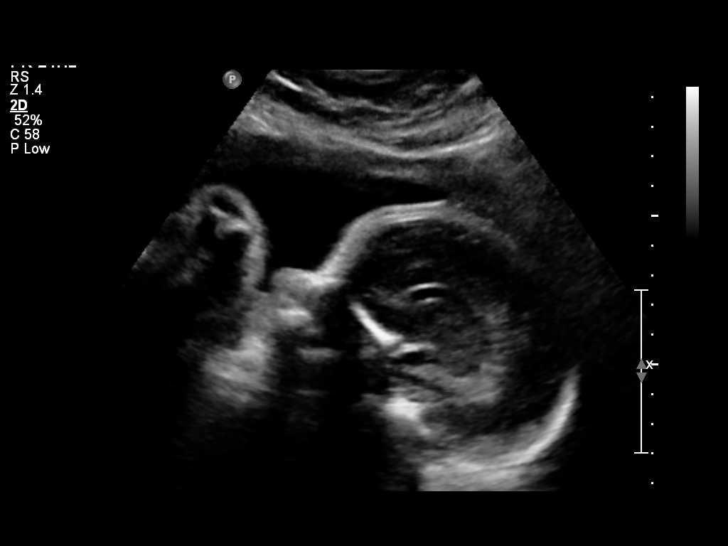

[13 of 13 positions shown; findings below may reference images not displayed]

IMPRESSION: See AS Obstetric US report.

## 2011-09-29 ENCOUNTER — Ambulatory Visit: Payer: Self-pay | Admitting: Family Medicine

## 2011-09-30 ENCOUNTER — Encounter: Payer: Self-pay | Admitting: Family Medicine

## 2011-09-30 ENCOUNTER — Other Ambulatory Visit (HOSPITAL_COMMUNITY)
Admission: RE | Admit: 2011-09-30 | Discharge: 2011-09-30 | Disposition: A | Discharge status: Home / Self Care | Payer: Self-pay | Source: Ambulatory Visit | Attending: Family Medicine | Admitting: Family Medicine

## 2011-09-30 ENCOUNTER — Ambulatory Visit (INDEPENDENT_AMBULATORY_CARE_PROVIDER_SITE_OTHER): Payer: Self-pay | Admitting: Family Medicine

## 2011-09-30 VITALS — BP 145/83 | HR 69 | Temp 98.3°F | Ht 62.0 in | Wt 256.3 lb

## 2011-09-30 DIAGNOSIS — N760 Acute vaginitis: Secondary | ICD-10-CM

## 2011-09-30 DIAGNOSIS — Z113 Encounter for screening for infections with a predominantly sexual mode of transmission: Secondary | ICD-10-CM | POA: Insufficient documentation

## 2011-09-30 DIAGNOSIS — N390 Urinary tract infection, site not specified: Secondary | ICD-10-CM

## 2011-09-30 DIAGNOSIS — B9689 Other specified bacterial agents as the cause of diseases classified elsewhere: Secondary | ICD-10-CM

## 2011-09-30 DIAGNOSIS — A499 Bacterial infection, unspecified: Secondary | ICD-10-CM

## 2011-09-30 LAB — POCT WET PREP (WET MOUNT)
Clue Cells Wet Prep Whiff POC: POSITIVE
WBC, Wet Prep HPF POC: >20

## 2011-09-30 LAB — POCT URINALYSIS DIPSTICK
Bilirubin, UA: NEGATIVE
Blood, UA: NEGATIVE
Glucose, UA: NEGATIVE
Spec Grav, UA: >=1.03

## 2011-09-30 MED ORDER — METRONIDAZOLE 500 MG PO TABS: 500.0000 mg | ORAL_TABLET | Freq: Two times a day (BID) | ORAL | Status: AC

## 2011-09-30 NOTE — Progress Notes (Signed)
  Subjective:    Patient ID: Kendra Harris, female    DOB: 09-19-1980, 31 y.o.   MRN: [id number]  HPI Pt comes today complaining of 2 day lower abdominal pain. Started as heavy sensation and now is more like burning on her vagina. She also complaint of vaginal discharge. She is sexually active with stable partner. LMP was 09/03/11. Denies, fever or chills. No nausea or vomiting. Dysuria or flank pain. No dyspareunia.  Review of Systems Per HPI    Objective:   Physical Exam  Vitals reviewed. Constitutional: No distress.  Cardiovascular: Normal rate.   Abdominal: Soft. Bowel sounds are normal. She exhibits no distension and no mass. There is no tenderness. There is no rebound and no guarding.  Genitourinary: Uterus normal. Vaginal discharge found.       Speculum: normal cervix, no erythema, no friable. Presence of thin abundant grayish malodorous vaginal discharge. Bimanual: no cervical motion tenderness, no tenderness on palpation of adnexa or uterus. No masses found.           Assessment & Plan:

## 2011-09-30 NOTE — Patient Instructions (Addendum)
Bacterial Vaginosis Bacterial vaginosis is an infection of the vagina. A healthy vagina has many kinds of good germs (bacteria). Sometimes the number of good germs can change. This allows bad germs to move in and cause an infection. You may be given medicine (antibiotics) to treat the infection.  HOME CARE  Take your medicine as told. Finish them even if you start to feel better.   Do not have sex until you finish your medicine.   Do not douche.   Practice safe sex.   Tell your sex partner that you have an infection. They should see their doctor for treatment if they have problems.  GET HELP RIGHT AWAY IF:  You do not get better after 3 days of treatment.   You have grey fluid (discharge) coming from your vagina.   You have pain.   You have a temperature of 102 F (38.9 C) or higher.  MAKE SURE YOU:   Understand these instructions.   Will watch your condition.   Will get help right away if you are not doing well or get worse.  Document Released: 11/04/2007 Document Revised: 01/14/2011 Document Reviewed: 11/04/2007 [facility] Patient Information 2012 [place], [place].

## 2011-09-30 NOTE — Assessment & Plan Note (Signed)
Signs and symptoms consistent  and Wet mount positive for BV. Pt has same partner at this time but has hx of risky sexual behavior. She is not concerned of STD's. Plan: Metronidazole 500 mg BID for 7 days. Advice against alcoholic beverages up to 48 h after last dose. GC and Chlamydia obtained today.

## 2011-11-08 ENCOUNTER — Ambulatory Visit: Payer: Self-pay | Admitting: Emergency Medicine

## 2011-12-10 ENCOUNTER — Encounter: Payer: Self-pay | Admitting: Family Medicine

## 2011-12-10 ENCOUNTER — Telehealth: Payer: Self-pay | Admitting: Emergency Medicine

## 2011-12-10 ENCOUNTER — Ambulatory Visit (INDEPENDENT_AMBULATORY_CARE_PROVIDER_SITE_OTHER): Payer: Medicaid Other | Admitting: Family Medicine

## 2011-12-10 VITALS — BP 154/95 | HR 73 | Temp 99.1°F | Ht 62.0 in | Wt 261.8 lb

## 2011-12-10 DIAGNOSIS — M545 Low back pain: Secondary | ICD-10-CM

## 2011-12-10 DIAGNOSIS — K219 Gastro-esophageal reflux disease without esophagitis: Secondary | ICD-10-CM

## 2011-12-10 DIAGNOSIS — N390 Urinary tract infection, site not specified: Secondary | ICD-10-CM | POA: Insufficient documentation

## 2011-12-10 DIAGNOSIS — I10 Essential (primary) hypertension: Secondary | ICD-10-CM

## 2011-12-10 DIAGNOSIS — Z6841 Body Mass Index (BMI) 40.0 and over, adult: Secondary | ICD-10-CM

## 2011-12-10 LAB — POCT UA - MICROSCOPIC ONLY

## 2011-12-10 LAB — POCT URINALYSIS DIPSTICK
Blood, UA: NEGATIVE
Protein, UA: NEGATIVE
Spec Grav, UA: 1.025
Urobilinogen, UA: 0.2

## 2011-12-10 MED ORDER — CEPHALEXIN 500 MG PO CAPS: 500.0000 mg | ORAL_CAPSULE | Freq: Three times a day (TID) | ORAL | Status: DC

## 2011-12-10 NOTE — Telephone Encounter (Signed)
Patient is calling for her results. 

## 2011-12-10 NOTE — Patient Instructions (Addendum)
Dear Kendra Harris,   It was great to see you today. Thank you for coming to clinic. Please read below regarding the issues that we discussed.   1. For your reflux, lets restart the low dose Prilosec. I want you to come back in a month to be reevaluated to see if this is helping.  2. For your urine infection, take the full week of antibiotics. 3. Regular exercise and the lifestyle changes we discussed will likely help your reflux and blood pressure.  4. Please take your blood pressure meds before visits as it appears you may need an additional medication.  5. Come in within a month for a PAP smear.   Please follow up in clinic in 4 weeks . Please call earlier if you have any questions or concerns.   Sincerely,  Dr. Tana Conch   My 5 to Fitness! These are tips I give to every patient that  are important for living a healthy life!   5: fruits and vegetables per day (work on 9 per day if you are at 5) 4: exercise 4-5 times per week for at least 30 minutes (walking counts!) 3: meals per day (don't skip breakfast!) 2: habits to quit  -smoking  -excess alcohol use (men >2 beer/day; women >1beer/day) 1: sweet per day (2 cookies, 1 small cup of ice cream, 12 oz soda)  These are general tips for healthy living. Try to start with 1 or 2 habit TODAY and make it a part of your life for several months. Once you have 1 or 2 habits down for several months, try to begin working on your next healthy habit. With every single step you take, you will be leading a healthier lifestyle!   Health Maintenance Due  Topic Date Due  . Pap Smear  03/31/1997  . Influenza Vaccine  10/10/2011    Gastroesophageal Reflux Disease, Adult Gastroesophageal reflux disease (GERD) happens when acid from your stomach flows up into the esophagus. When acid comes in contact with the esophagus, the acid causes soreness (inflammation) in the esophagus. Over time, GERD may create small holes (ulcers) in the lining of  the esophagus. CAUSES   Increased body weight. This puts pressure on the stomach, making acid rise from the stomach into the esophagus.  Smoking. This increases acid production in the stomach.  Drinking alcohol. This causes decreased pressure in the lower esophageal sphincter (valve or ring of muscle between the esophagus and stomach), allowing acid from the stomach into the esophagus.  Late evening meals and a full stomach. This increases pressure and acid production in the stomach.  A malformed lower esophageal sphincter. Sometimes, no cause is found. SYMPTOMS   Burning pain in the lower part of the mid-chest behind the breastbone and in the mid-stomach area. This may occur twice a week or more often.  Trouble swallowing.  Sore throat.  Dry cough.  Asthma-like symptoms including chest tightness, shortness of breath, or wheezing. DIAGNOSIS  Your caregiver may be able to diagnose GERD based on your symptoms. In some cases, X-rays and other tests may be done to check for complications or to check the condition of your stomach and esophagus. TREATMENT  Your caregiver may recommend over-the-counter or prescription medicines to help decrease acid production. Ask your caregiver before starting or adding any new medicines.  HOME CARE INSTRUCTIONS   Change the factors that you can control. Ask your caregiver for guidance concerning weight loss, quitting smoking, and alcohol consumption.  Avoid foods  and drinks that make your symptoms worse, such as:  Caffeine or alcoholic drinks.  Chocolate.  Peppermint or mint flavorings.  Garlic and onions.  Spicy foods.  Citrus fruits, such as oranges, lemons, or limes.  Tomato-based foods such as sauce, chili, salsa, and pizza.  Fried and fatty foods.  Avoid lying down for the 3 hours prior to your bedtime or prior to taking a nap.  Eat small, frequent meals instead of large meals.  Wear loose-fitting clothing. Do not wear anything  tight around your waist that causes pressure on your stomach.  Raise the head of your bed 6 to 8 inches with wood blocks to help you sleep. Extra pillows will not help.  Only take over-the-counter or prescription medicines for pain, discomfort, or fever as directed by your caregiver.  Do not take aspirin, ibuprofen, or other nonsteroidal anti-inflammatory drugs (NSAIDs). SEEK IMMEDIATE MEDICAL CARE IF:   You have pain in your arms, neck, jaw, teeth, or back.  Your pain increases or changes in intensity or duration.  You develop nausea, vomiting, or sweating (diaphoresis).  You develop shortness of breath, or you faint.  Your vomit is green, yellow, black, or looks like coffee grounds or blood.  Your stool is red, bloody, or black. These symptoms could be signs of other problems, such as heart disease, gastric bleeding, or esophageal bleeding. MAKE SURE YOU:   Understand these instructions.  Will watch your condition.  Will get help right away if you are not doing well or get worse. Document Released: 11/04/2004 Document Revised: 04/19/2011 Document Reviewed: 08/14/2010 [facility] Patient Information 2013 [place], [place].

## 2011-12-10 NOTE — Telephone Encounter (Signed)
fwd

## 2011-12-10 NOTE — Telephone Encounter (Signed)
Informed patient UTI and will send in rx.

## 2011-12-11 ENCOUNTER — Other Ambulatory Visit: Payer: Self-pay | Admitting: Family Medicine

## 2011-12-11 ENCOUNTER — Telehealth: Payer: Self-pay | Admitting: Family Medicine

## 2011-12-11 DIAGNOSIS — K219 Gastro-esophageal reflux disease without esophagitis: Secondary | ICD-10-CM

## 2011-12-11 DIAGNOSIS — N390 Urinary tract infection, site not specified: Secondary | ICD-10-CM

## 2011-12-11 MED ORDER — CEPHALEXIN 500 MG PO CAPS: 500.0000 mg | ORAL_CAPSULE | Freq: Three times a day (TID) | ORAL | Status: DC

## 2011-12-11 MED ORDER — OMEPRAZOLE 20 MG PO CPDR: 40.0000 mg | DELAYED_RELEASE_CAPSULE | Freq: Every day | ORAL | Status: DC

## 2011-12-11 NOTE — Assessment & Plan Note (Addendum)
Weight trending up. Encouraged lifestyle changes. Encouraged continued exercise, more fruits/vegetables, eating 3 meals a day. ALso discussed avoiding all sodas including diet and focusing on water.   Stressed to patient that weight loss may help with BP and GERD as well as prevent other diseases. Should follow up with PCP and may need nutritionist/health coach.

## 2011-12-11 NOTE — Assessment & Plan Note (Signed)
Will return to trying lower dose of Prilosec given higher dose caused nausea and zantac not effective. Discouraged nsaids (patient not using) and encouraged weight loss.

## 2011-12-11 NOTE — Assessment & Plan Note (Signed)
Patient with elevated BP likely due to not taking medication this AM. Encouraged lifestyle changes. Suspect patient will need an additional agent and needs close follow up within a month.

## 2011-12-11 NOTE — Progress Notes (Signed)
Subjective:   1. Polyuria-x4-5 days. No dysuria. Low back pain for 2-3 days. States her UTI's usually feel like this. Denies vaginal discharge or pain with sex or defecation (BM every 2 days better than previous for patient). Denies nausea/vomiting/fever/chills/fatigue/overall sick feelings.   2. Indigestion/GERD_epigastric pain that goes up into her chest worsening over last 3 weeks. Burning pain worse at night. Used to take prilosec which she quit 2 months ago. SHe says she had her dose increased and it made her nauseous.  Denies nsaid use. Has been using zantac but says not as helpful as prilosec. States sodas tend to make pain worse (diet) as well. Denies smoking or alcohol.   3. Obesity-walking 4 days a week for 30 minutes for 5 months. TYpically only eats 2 meals a day.   4.  Hypertension- Compliant with medications-yes without side effects typically but did not take med before visit today. Encouraged patient to always take propranolol in AM.  Denies any CP, HA, SOB, blurry vision, LE edema, transient weakness, orthopnea, PND.        ROS--See HPI  Past Medical History-smoking status noted: nonsmoker.  Reviewed problem list.  Medications- reviewed and updated Chief complaint-noted  Objective: BP 154/95  Pulse 73  Temp 99.1 F (37.3 C) (Oral)  Ht 5\' 2"  (1.575 m)  Wt 261 lb 12.8 oz (118.752 kg)  BMI 47.88 kg/m2  LMP 11/01/2011 Gen: NAD, morbidly obese CV: RRR no mrg Lungs: CTAB Abd: soft/nontender/nondistended/normal bowel sounds MSK: no CVA tenderness or suprapubic tenderness Ext: no edema  Assessment/Plan: See problem oriented charted

## 2011-12-11 NOTE — Telephone Encounter (Signed)
Called emergency line bc she didn't receive rx for abx or PPI. Request sent to [facility], looks like was sent [facility]. I sent to [facility] per pt request.

## 2011-12-11 NOTE — Assessment & Plan Note (Signed)
Will treat with 7 days of Keflex. No CVA tenderness or fever to indicate pyelonephritis. DId not obtain culture.

## 2011-12-11 NOTE — Assessment & Plan Note (Signed)
Told patient to make appt for pap soon given history of ASCUS. Did look like she had 2 negatives with last being 04/28/09 but as almost 3 years, reasonable to obtain another PAP.

## 2012-01-11 ENCOUNTER — Ambulatory Visit: Payer: Medicaid Other | Admitting: Emergency Medicine

## 2012-02-11 ENCOUNTER — Ambulatory Visit (INDEPENDENT_AMBULATORY_CARE_PROVIDER_SITE_OTHER): Payer: Medicaid Other | Admitting: Family Medicine

## 2012-02-11 ENCOUNTER — Encounter: Payer: Self-pay | Admitting: Family Medicine

## 2012-02-11 VITALS — BP 150/89 | HR 61 | Temp 99.2°F | Ht 62.0 in | Wt 259.3 lb

## 2012-02-11 DIAGNOSIS — H109 Unspecified conjunctivitis: Secondary | ICD-10-CM | POA: Insufficient documentation

## 2012-02-11 NOTE — Patient Instructions (Addendum)
You may have had an allergy to your eye lash. Try to avoid using the glue or adhesive in the future. You can use dry eye drops as needed. If the redness doesn't continue improving, or you have pain, visual loss or worsening then return to MD.   Allergic Conjunctivitis A thin membrane (conjunctiva) covers the eyeball and underside of the eyelids. Allergic conjunctivitis happens when the thin membrane gets irritated from things like animal dander, pollen, perfumes, or smoke (allergens). The membrane may become puffy (swollen) and red. Small bumps may form on the inside of the eyelids. Your eyes may get teary, itchy, or burn. It cannot be passed to another person (contagious).  HOME CARE  Wash your hands before and after applying medicated drops or creams.  Do not touch the drop or cream tube to your eye or eyelids.  Do not use your soft contacts. Throw them away. Use a new pair once recovery is complete.  Do not use your hard contacts. They need to be washed (sterilized) thoroughly after recovery is complete.  Put a cold cloth to your eye(s) if you have itching and burning. GET HELP RIGHT AWAY IF:   You are not feeling better in 2 to 3 days after treatment.  Your lids are sticky or stick together.  Fluid comes from the eye(s).  You become sensitive to light.  You have a temperature by mouth above 102 F (38.9 C).  You have pain in and around the eye(s).  You start to have vision problems. MAKE SURE YOU:   Understand these instructions.  Will watch your condition.  Will get help right away if you are not doing well or get worse. Document Released: 07/15/2009 Document Revised: 04/19/2011 Document Reviewed: 07/15/2009 [facility] Patient Information 2013 [place], [place].

## 2012-02-11 NOTE — Assessment & Plan Note (Signed)
Improving. Likely allergic reaction to eyelash adhesive. No sign of infection or abrasion. Advised to try OTC dry eye drops as needed and continue observation. RTC if develops any pain, increase drainage, visual changes. Avoid eyelashes in future.

## 2012-02-11 NOTE — Progress Notes (Signed)
  Subjective:    Patient ID: Kendra Harris, female    DOB: 1980-07-25, 32 y.o.   MRN: [id number]  HPI  1. Left eye redness. Started one week ago. She put on a set of false eyelashes and the next morning her left eye had redness in the medial corner. She removed them. Since the onset, redness has slightly improved. She notes tiny amount crusting. Right eye is unaffected.   Does not wear contacts or corrective eye wear. No contact with sick people.  Review of Systems There is no pain, swelling, visual problem, headache, fever, profuse pus or drainage.     Objective:   Physical Exam  Vitals reviewed. Constitutional: She appears well-developed and well-nourished. No distress.  HENT:  Head: Normocephalic and atraumatic.  Right Ear: External ear normal.  Left Ear: External ear normal.  Mouth/Throat: Oropharynx is clear and moist.  Eyes: EOM are normal. Pupils are equal, round, and reactive to light. Right eye exhibits no discharge. Left eye exhibits no discharge. No scleral icterus.       Left eye conjunctival erythema limited to medial aspect. No pain with EOM. No tenderness.  Fluorescein testing negative for abrasion.  Visual fields intact.  Lymphadenopathy:    She has no cervical adenopathy.  Skin: No rash noted. She is not diaphoretic.       Assessment & Plan:

## 2012-03-23 ENCOUNTER — Ambulatory Visit: Payer: Medicaid Other | Admitting: Emergency Medicine

## 2012-05-12 ENCOUNTER — Other Ambulatory Visit: Payer: Self-pay | Admitting: Family Medicine

## 2012-05-12 ENCOUNTER — Telehealth: Payer: Self-pay | Admitting: Emergency Medicine

## 2012-05-12 NOTE — Telephone Encounter (Signed)
Patient would like to speak to the nurse about what she can take for a sore throat.

## 2012-05-12 NOTE — Telephone Encounter (Signed)
Cold sx x 4 days and coughing up yellow mucus.  Denies any fever or shortness of breath.  Unable to come for office visit and wants to know what she can try for sore throat.  Has used throat spray and not helping.  Informed she can try throat lozenges, warm salt water gargles, and Tylenol/ibuprofen as needed for throat pain.  Patient informed she can also go to urgent care over weekend or call back for appt on Monday if symptoms not better or worsens.  Pt. Agreeable.  Gaylene Brooks, RN\

## 2012-05-15 ENCOUNTER — Telehealth: Payer: Self-pay | Admitting: Emergency Medicine

## 2012-05-15 ENCOUNTER — Ambulatory Visit (INDEPENDENT_AMBULATORY_CARE_PROVIDER_SITE_OTHER): Payer: Medicaid Other | Admitting: Family Medicine

## 2012-05-15 VITALS — BP 163/107 | HR 64 | Temp 98.4°F | Ht 62.0 in | Wt 256.0 lb

## 2012-05-15 DIAGNOSIS — J029 Acute pharyngitis, unspecified: Secondary | ICD-10-CM

## 2012-05-15 NOTE — Progress Notes (Signed)
  Subjective:    Patient ID: Kendra Harris, female    DOB: March 12, 1980, 32 y.o.   MRN: [id number]  HPI  # SDA. She had a cough and sore throat for the past week that hurts particularly worse at nighttime  She has also noticed her eyes becoming more red. This is getting better though. She denies eye pain, significant discharge.  Progression? A little bit better.  Sick contacts? Mother. She has a cold. Her children are not sick.  Tobacco? No Medications tried? Chloraseptic, Halls, salt and water, Vicks do not help; Tylenol every 6 hours helped a little bit Flu shot? No  Review of Systems No fevers, chills, body aches, nausea, vomiting, diarrhea, ear pain   Allergies, medication, past medical history reviewed.  Smoking status noted. Depression GERD High risk sexual behavior Migraines Morbid obesity Hypertension--she did not take medication today; ROS: denies headache, chest pain, urinary difficulties      Objective:   Physical Exam GEN: NAD HEENT:   Head: [place]/AT   Eyes: conjunctival erythema, L>R, no drainage   Ears: TM clear bilaterally with good light reflex and without erythema or air-fluid level   Nose: no rhinorrhea, nasal congestion    Mouth: MMM; no tonsillar adenopathy; no oropharyngeal erythema NECK: no LAD PULM: NI WOB; CTAB without w/r/r SKIN: warm, dry    Assessment & Plan:

## 2012-05-15 NOTE — Patient Instructions (Signed)
Ibuprofen 800 mg every 8 hours as needed for sore throat  Try honey for cough  Follow-up as needed. If you develop worsening pain, fevers (temperature 101.5 or greater)

## 2012-05-15 NOTE — Telephone Encounter (Signed)
Saw Dr Madolyn Frieze this AM  - States that the pharmacy doesn't have 800 Ibuprofen OTC - wants to know if she can call some in.  [facility]- [address]

## 2012-05-15 NOTE — Assessment & Plan Note (Signed)
Sore throat, nasal congestion, conjunctivitis. She is improving overall but the sore throat bothers her particularly at nighttime.  -Ibuprofen prn, honey (she declines medication for cough), hydration  -Follow-up as needed. See AVS.

## 2012-05-15 NOTE — Telephone Encounter (Signed)
LMOVM for pt to return call if she needed to, but 800mg  ibuprofen is the same thing as 4 regular ibuprofen. Armoni Depass, Maryjo Rochester

## 2012-07-07 ENCOUNTER — Ambulatory Visit: Payer: Medicaid Other

## 2012-07-27 ENCOUNTER — Other Ambulatory Visit: Payer: Self-pay | Admitting: *Deleted

## 2012-07-27 MED ORDER — PROPRANOLOL HCL 40 MG PO TABS: 40.0000 mg | ORAL_TABLET | Freq: Two times a day (BID) | ORAL | Status: DC

## 2012-08-31 ENCOUNTER — Telehealth: Payer: Self-pay | Admitting: Emergency Medicine

## 2012-08-31 MED ORDER — PROPRANOLOL HCL 40 MG PO TABS: 40.0000 mg | ORAL_TABLET | Freq: Two times a day (BID) | ORAL | Status: DC

## 2012-08-31 NOTE — Telephone Encounter (Signed)
Pt is requesting a refill on propranolol be sent to her pharmacy on file. She needs enough to get her until her appointment 8/22. JW

## 2012-08-31 NOTE — Telephone Encounter (Signed)
Done. .Kendra Harris  

## 2012-09-04 ENCOUNTER — Ambulatory Visit (INDEPENDENT_AMBULATORY_CARE_PROVIDER_SITE_OTHER): Payer: Medicaid Other | Admitting: Family Medicine

## 2012-09-04 ENCOUNTER — Encounter: Payer: Self-pay | Admitting: Family Medicine

## 2012-09-04 VITALS — BP 124/86 | HR 60 | Ht 62.0 in | Wt 228.0 lb

## 2012-09-04 DIAGNOSIS — F329 Major depressive disorder, single episode, unspecified: Secondary | ICD-10-CM

## 2012-09-04 DIAGNOSIS — F32A Depression, unspecified: Secondary | ICD-10-CM

## 2012-09-04 DIAGNOSIS — R634 Abnormal weight loss: Secondary | ICD-10-CM

## 2012-09-04 MED ORDER — CITALOPRAM HYDROBROMIDE 20 MG PO TABS: 20.0000 mg | ORAL_TABLET | Freq: Every day | ORAL | Status: DC

## 2012-09-04 NOTE — Progress Notes (Addendum)
Patient ID: Almond Lint    DOB: February 09, 1981, 32 y.o.   MRN: [id number] --- Subjective:  Kendra Harris is a 32 y.o.female who presents for same day appointment with feeling of depression in the last month - depression: reports not eating well for the last month, not sleeping with difficulty falling asleep, crying spells. No new stress or life change that is prompting this. She has not been able to take care of her 3 children and moved back with her mother so that her mother could help her. She has no motivation to cook, clean or do shopping. She doesn't work. She is saddened by not being able to take care of her children like she used to. She has not found anything to cope with it. She denies SI/HI She has had similar feelings in the past and was treated for depression in 2012. She was on celexa 20mg  daily for 2 years which worked well. She stopped it thinking she was feeling better. When she took celexa, she denies any episodes of mania. She [facility] also [facility] by a therapist which also helped.   PHQ9: 16 with very difficult  ROS: see HPI Past Medical History: reviewed and updated medications and allergies. Social History: Tobacco: none She denies alcohol or other illicit drug use.   Objective: Filed Vitals:   09/04/12 1011  BP: 124/86  Pulse: 60    Physical Examination:   General appearance - alert, well appearing, and in no distress Psych - slightly flat affect, normal speech and fluency.  Neck - supple, no significant adenopathy Chest - clear to auscultation, no wheezes, rales or rhonchi, symmetric air entry Heart - normal rate, regular rhythm, normal S1, S2, no murmurs, rubs, clicks or gallops

## 2012-09-04 NOTE — Assessment & Plan Note (Signed)
Will start her back on celexa 20mg  daily which worked for her in the past. Also recommended counseling at mental health which she seemed interested in.  Weight loss is likely from depression and poor appetite, but I wanted to exclude other causes by obtaining TSH and CMP. Patient left the clinic before getting bloodwork. Consider at follow up visit.  Follow up in 2-3 weeks.

## 2012-09-04 NOTE — Patient Instructions (Addendum)
Please follow up in 2-3 weeks with Dr. Elwyn Reach or with me (Dr. Gwenlyn Saran) if she is not available.

## 2012-09-29 ENCOUNTER — Ambulatory Visit: Payer: Medicaid Other | Admitting: Emergency Medicine

## 2012-10-06 ENCOUNTER — Telehealth: Payer: Self-pay | Admitting: Emergency Medicine

## 2012-10-06 NOTE — Telephone Encounter (Signed)
Attempted to call patient regarding form to be completed in my box.  I have never seen the patient in the office.  She will need an appointment with me before I can complete this paperwork.  Additionally, she needs to be seen for f/u after being started on Celexa by Dr. Gwenlyn Saran in 08/2012.  Unfortunately, neither number listed in the chart is a valid number.  I will send a letter to the address on file requesting that she make an appt to see me.

## 2012-10-24 ENCOUNTER — Encounter: Payer: Self-pay | Admitting: Emergency Medicine

## 2012-10-24 ENCOUNTER — Ambulatory Visit (INDEPENDENT_AMBULATORY_CARE_PROVIDER_SITE_OTHER): Payer: Medicaid Other | Admitting: Emergency Medicine

## 2012-10-24 VITALS — BP 133/84 | HR 60 | Ht 62.0 in | Wt 218.4 lb

## 2012-10-24 DIAGNOSIS — F329 Major depressive disorder, single episode, unspecified: Secondary | ICD-10-CM

## 2012-10-24 DIAGNOSIS — R634 Abnormal weight loss: Secondary | ICD-10-CM | POA: Insufficient documentation

## 2012-10-24 LAB — COMPREHENSIVE METABOLIC PANEL
ALT: 10 U/L (ref 0–35)
CO2: 27 mEq/L (ref 19–32)
Chloride: 103 mEq/L (ref 96–112)
Sodium: 138 mEq/L (ref 135–145)
Total Bilirubin: 0.6 mg/dL (ref 0.3–1.2)
Total Protein: 7.1 g/dL (ref 6.0–8.3)

## 2012-10-24 MED ORDER — CITALOPRAM HYDROBROMIDE 40 MG PO TABS: 40.0000 mg | ORAL_TABLET | Freq: Every day | ORAL | Status: DC

## 2012-10-24 NOTE — Patient Instructions (Addendum)
It was nice to see you!  We are going to work on this depression.  Increase Celexa to 40mg  daily.  For now, you can take 2 20mg  tablets every day.  I will send in a prescription for 40mg  tablets.  If you start thinking about hurting yourself or someone else, or those voices get louder or meaner, please go to [facility] ER for evaluation.  I will see you back in 2 weeks.

## 2012-10-24 NOTE — Progress Notes (Signed)
  Subjective:    Patient ID: Kendra Harris, female    DOB: [birth year], 32 y.o.   MRN: [id number]  HPI Kendra Harris is here for f/u depression.  She was seen in July 2014 for depression.  She was started on Celexa 20mg  daily at that time as it had worked well in the past.  She reports 100% compliance with medication.  States the depression is getting worse.  She is more forgetful, not sleeping, not eating (no appetite), no energy, and feels sad all the time.  Denies SI/HI.  Does report auditory hallucinations that tell her she is a bad person.  Denies any directive hallucinations.  She has not seen a counselor due to not wanting to be around anyone.  Mom reports that several other family members have had depression with psychotic features.  Has also had weight loss.  She reports eating a few bites at each meal before she is full.  Denies any nausea or vomiting.  Does have some constipation but this is a long standing issue for her.  No diarrhea or blood in stools.  I have reviewed and updated the following as appropriate: allergies and current medications SHx: never smoker  Review of Systems See HPI    Objective:   Physical Exam BP 133/84  Pulse 60  Ht 5\' 2"  (1.575 m)  Wt 218 lb 6.4 oz (99.066 kg)  BMI 39.94 kg/m2  LMP 10/04/2012 Gen: alert, cooperative, NAD, flattened affect HEENT: AT/[place], sclera white, MMM Neck: supple, normal thyroid CV: RRR, no murmurs Pulm: CTAB, no wheezes or rales Ext: no edema      Assessment & Plan:

## 2012-10-24 NOTE — Assessment & Plan Note (Addendum)
Worsening.  Now with some psychotic features. PHQ-9: 20 (3 for everything except 2 for moving slowly and 0 for SI) Discussed options with patient. Will check CMP and TSH.  Will increase celexa to 40mg  daily. Encouraged her to find a counselor when able. Reviewed reasons for immediate psych evaluation with patient and mother. Completed form for home health aide. Follow up in 2 weeks.  May need to consider addition of atypical antipsychotic in 6-8 weeks.

## 2012-10-24 NOTE — Assessment & Plan Note (Signed)
Suspect secondary to depression and poor intake. Will check TSH and CMP. Follow up in 2 weeks.

## 2012-10-25 LAB — TSH: TSH: 1.917 u[IU]/mL (ref 0.350–4.500)

## 2012-10-26 ENCOUNTER — Telehealth: Payer: Self-pay | Admitting: Emergency Medicine

## 2012-10-26 NOTE — Telephone Encounter (Signed)
Pt would like a call from someone about her recent lab work. Also she left forms to Dr. Piedad Climes to fill out and fax. She wanted to know if this was done. JW

## 2012-10-26 NOTE — Telephone Encounter (Signed)
Called pt. Informed. .Kendra Harris  

## 2012-10-26 NOTE — Telephone Encounter (Signed)
Her lab results were normal.  I have completed and faxed the form to [facility].

## 2012-10-26 NOTE — Telephone Encounter (Signed)
Will fwd. To Dr.Honig for review. .Denora Wysocki  

## 2012-12-23 ENCOUNTER — Encounter (HOSPITAL_COMMUNITY): Payer: Self-pay | Admitting: Emergency Medicine

## 2012-12-23 ENCOUNTER — Emergency Department (HOSPITAL_COMMUNITY)
Admission: EM | Admit: 2012-12-23 | Discharge: 2012-12-23 | Disposition: A | Discharge status: Home / Self Care | Payer: Medicaid Other | Attending: Emergency Medicine | Admitting: Emergency Medicine

## 2012-12-23 DIAGNOSIS — Z8742 Personal history of other diseases of the female genital tract: Secondary | ICD-10-CM | POA: Insufficient documentation

## 2012-12-23 DIAGNOSIS — K002 Abnormalities of size and form of teeth: Secondary | ICD-10-CM | POA: Insufficient documentation

## 2012-12-23 DIAGNOSIS — K089 Disorder of teeth and supporting structures, unspecified: Secondary | ICD-10-CM | POA: Insufficient documentation

## 2012-12-23 DIAGNOSIS — K0889 Other specified disorders of teeth and supporting structures: Secondary | ICD-10-CM

## 2012-12-23 DIAGNOSIS — Z79899 Other long term (current) drug therapy: Secondary | ICD-10-CM | POA: Insufficient documentation

## 2012-12-23 DIAGNOSIS — Z87440 Personal history of urinary (tract) infections: Secondary | ICD-10-CM | POA: Insufficient documentation

## 2012-12-23 MED ORDER — PENICILLIN V POTASSIUM 500 MG PO TABS: 500.0000 mg | ORAL_TABLET | Freq: Four times a day (QID) | ORAL | Status: AC

## 2012-12-23 MED ORDER — OXYCODONE-ACETAMINOPHEN 5-325 MG PO TABS
1.0000 | ORAL_TABLET | Freq: Once | ORAL | Status: AC
  Administered 2012-12-23: 1 via ORAL
  Filled 2012-12-23: qty 1

## 2012-12-23 MED ORDER — PENICILLIN V POTASSIUM 250 MG PO TABS
500.0000 mg | ORAL_TABLET | Freq: Once | ORAL | Status: AC
  Administered 2012-12-23: 500 mg via ORAL
  Filled 2012-12-23: qty 2

## 2012-12-23 MED ORDER — IBUPROFEN 600 MG PO TABS: 600.0000 mg | ORAL_TABLET | Freq: Four times a day (QID) | ORAL | Status: DC | PRN

## 2012-12-23 NOTE — ED Notes (Signed)
Pt reports left lower tooth pain that started last night at 2300. Pt has tooth apt on Tuesday but pain is unbearable. Pt feels mouth is swollen in the area is well

## 2012-12-23 NOTE — ED Provider Notes (Signed)
CSN: [id number]     Arrival date & time 12/23/12  7829 History   First MD Initiated Contact with Patient 12/23/12 [phone]     Chief Complaint  Patient presents with  . Dental Pain   (Consider location/radiation/quality/duration/timing/severity/associated sxs/prior Treatment) Patient is a 32 y.o. female presenting with tooth pain. The history is provided by the patient.  Dental Pain Location:  Lower Lower teeth location:  20/LL 2nd bicuspid Quality:  Dull Severity:  Moderate Onset quality:  Gradual Duration:  12 hours Timing:  Constant Progression:  Unchanged Chronicity:  New Context: poor dentition   Relieved by:  Nothing Worsened by:  Nothing tried Ineffective treatments:  None tried Associated symptoms: no congestion, no difficulty swallowing, no drooling, no facial pain, no facial swelling, no fever, no headaches, no neck pain, no neck swelling, no oral bleeding, no oral lesions and no trismus     Past Medical History  Diagnosis Date  . DELAYED MENSES 02/11/2010    Qualifier: Diagnosis of  By: Wallene Huh  MD, Rande Lawman    . Urinary tract infection 03/04/2011   History reviewed. No pertinent past surgical history. No family history on file. History  Substance Use Topics  . Smoking status: Never Smoker   . Smokeless tobacco: Not on file  . Alcohol Use: No   OB History   Grav Para Term Preterm Abortions TAB SAB Ect Mult Living                 Review of Systems  Constitutional: Negative for fever and fatigue.  HENT: Negative for congestion, drooling, facial swelling and mouth sores.   Eyes: Negative for pain.  Respiratory: Negative for cough and shortness of breath.   Cardiovascular: Negative for chest pain.  Gastrointestinal: Negative for nausea, vomiting, abdominal pain and diarrhea.  Genitourinary: Negative for dysuria and hematuria.  Musculoskeletal: Negative for back pain, gait problem and neck pain.  Skin: Negative for color change.  Neurological: Negative for dizziness  and headaches.  Hematological: Negative for adenopathy.  Psychiatric/Behavioral: Negative for behavioral problems.  All other systems reviewed and are negative.    Allergies  Review of patient's allergies indicates no known allergies.  Home Medications   Current Outpatient Rx  Name  Route  Sig  Dispense  Refill  . citalopram (CELEXA) 40 MG tablet   Oral   Take 1 tablet (40 mg total) by mouth daily.   30 tablet   2   . EXPIRED: omeprazole (PRILOSEC) 20 MG capsule   Oral   Take 2 capsules (40 mg total) by mouth daily.   60 capsule   5   . propranolol (INDERAL) 40 MG tablet   Oral   Take 1 tablet (40 mg total) by mouth 2 (two) times daily.   60 tablet   0     Needs appt with PCP prior to additional refills.    BP 171/113  Pulse 61  Temp(Src) 98.2 F (36.8 C) (Oral)  Resp 16  SpO2 100%  LMP 12/03/2012 Physical Exam  Nursing note and vitals reviewed. Constitutional: She is oriented to person, place, and time. She appears well-developed and well-nourished.  HENT:  Head: Normocephalic.  Mouth/Throat: Oropharynx is clear and moist. No oropharyngeal exudate.  Poor dentition diffusely.  Moderate tenderness to palpation of the left lower second premolar.  No obvious peri-apical or periodontal abscess. Mild induration at the gingival tissue at the base of the left lower second pre-molar. No fluctuance is noted.  Eyes: Conjunctivae and EOM are  normal. Pupils are equal, round, and reactive to light.  Neck: Normal range of motion. Neck supple.  Cardiovascular: Normal rate, regular rhythm, normal heart sounds and intact distal pulses.  Exam reveals no gallop and no friction rub.   No murmur heard. Pulmonary/Chest: Effort normal and breath sounds normal. No respiratory distress. She has no wheezes.  Abdominal: Soft. Bowel sounds are normal. There is no tenderness. There is no rebound and no guarding.  Musculoskeletal: Normal range of motion. She exhibits no edema and no  tenderness.  Neurological: She is alert and oriented to person, place, and time.  Skin: Skin is warm and dry.  Psychiatric: She has a normal mood and affect. Her behavior is normal.    ED Course  Procedures (including critical care time) Labs Review Labs Reviewed - No data to display Imaging Review No results found.  EKG Interpretation   None       MDM   1. Pain, dental    7:06 AM 32 y.o. female who presents with dental pain which began yesterday evening. The patient denies any fevers, vomiting, or diarrhea. She is afebrile here and vital signs are unremarkable. She notes that she has hypertension at baseline which is likely slightly elevated here due to pain. She states that she has a dental appointment planned 3 days from now. I offered a dental block which she refused. Will give one Percocet here and recommend scheduled NSAIDs and penicillin for home.  7:08 AM:  I have discussed the diagnosis/risks/treatment options with the patient and believe the pt to be eligible for discharge home to follow-up with her dentist as scheduled. We also discussed returning to the ED immediately if new or worsening sx occur. We discussed the sx which are most concerning (e.g., worsening pain, fever, inc swelling, purulent drainage) that necessitate immediate return. Any new prescriptions provided to the patient are listed below.  New Prescriptions   No medications on file       Junius Argyle, MD 12/23/12 [phone]

## 2013-03-22 ENCOUNTER — Encounter: Payer: Self-pay | Admitting: Emergency Medicine

## 2013-03-22 ENCOUNTER — Ambulatory Visit (INDEPENDENT_AMBULATORY_CARE_PROVIDER_SITE_OTHER): Payer: Medicaid Other | Admitting: Emergency Medicine

## 2013-03-22 VITALS — BP 158/99 | HR 67 | Ht 62.0 in | Wt 209.3 lb

## 2013-03-22 DIAGNOSIS — IMO0001 Reserved for inherently not codable concepts without codable children: Secondary | ICD-10-CM

## 2013-03-22 DIAGNOSIS — Z309 Encounter for contraceptive management, unspecified: Secondary | ICD-10-CM

## 2013-03-22 DIAGNOSIS — I10 Essential (primary) hypertension: Secondary | ICD-10-CM

## 2013-03-22 LAB — POCT URINE PREGNANCY: Preg Test, Ur: NEGATIVE

## 2013-03-22 MED ORDER — MEDROXYPROGESTERONE ACETATE 150 MG/ML IM SUSP
150.0000 mg | Freq: Once | INTRAMUSCULAR | Status: AC
  Administered 2013-03-22: 150 mg via INTRAMUSCULAR

## 2013-03-22 MED ORDER — PROPRANOLOL HCL 40 MG PO TABS: 40.0000 mg | ORAL_TABLET | Freq: Two times a day (BID) | ORAL | Status: DC

## 2013-03-22 NOTE — Assessment & Plan Note (Signed)
Has heavy periods with cramps. Did well previously on Depo. Urine pregnancy negative.  Restart depo today. F/u 3 months.

## 2013-03-22 NOTE — Assessment & Plan Note (Addendum)
Elevated today, but hasn't taken medication. Refilled propranolol. F/u in a month or so for recheck.

## 2013-03-22 NOTE — Progress Notes (Signed)
   Subjective:    Patient ID: Almond Lintasha L Silguero, female    DOB: 06/24/1980, 33 y.o.   MRN: [id number]  HPI Almond Lintasha L Kana is here for birth control.  Birth control She would like to restart depo.  She has regular, heavy periods with cramping.  She states this improved on the depo in the past.  Currently sexually active with 1 partner, using condoms.  No vaginal discharge or other symptoms.  Does have migraine headaches, but states these got better on depo.  No history of blood clot or smoking.  Blood pressure She states she has not taken her medication this morning.  Current Outpatient Prescriptions on File Prior to Visit  Medication Sig Dispense Refill  . citalopram (CELEXA) 40 MG tablet Take 1 tablet (40 mg total) by mouth daily.  30 tablet  2  . ibuprofen (ADVIL,MOTRIN) 600 MG tablet Take 1 tablet (600 mg total) by mouth every 6 (six) hours as needed.  30 tablet  0  . omeprazole (PRILOSEC) 20 MG capsule Take 2 capsules (40 mg total) by mouth daily.  60 capsule  5   No current facility-administered medications on file prior to visit.    I have reviewed and updated the following as appropriate: allergies and current medications SHx: non smoker  Health Maintenance: will f/u for a pap smear in the next few months.    Review of Systems See HPI    Objective:   Physical Exam BP 158/99  Pulse 67  Ht 5\' 2"  (1.575 m)  Wt 209 lb 4.8 oz (94.938 kg)  BMI 38.27 kg/m2  LMP 03/01/2013 Gen: alert, cooperative, NAD      Assessment & Plan:

## 2013-03-22 NOTE — Patient Instructions (Signed)
It was nice to see you!  Come back in 3 months for your next depo shot.  I will see you back sometime in the next few months to do a pap smear and recheck your blood pressure.

## 2013-04-27 ENCOUNTER — Encounter: Payer: Self-pay | Admitting: Family Medicine

## 2013-04-27 ENCOUNTER — Ambulatory Visit (INDEPENDENT_AMBULATORY_CARE_PROVIDER_SITE_OTHER): Payer: Medicaid Other | Admitting: Family Medicine

## 2013-04-27 VITALS — BP 168/94 | HR 52 | Temp 98.9°F | Ht 62.0 in | Wt 201.8 lb

## 2013-04-27 DIAGNOSIS — N920 Excessive and frequent menstruation with regular cycle: Secondary | ICD-10-CM

## 2013-04-27 DIAGNOSIS — I10 Essential (primary) hypertension: Secondary | ICD-10-CM

## 2013-04-27 NOTE — Progress Notes (Signed)
Patient ID: Kendra Lintasha L Spranger, female   DOB: [birth year], 33 y.o.   MRN: [id number]   Subjective:  HPI:   Kendra Harris is a 33 y.o. female with a history of HTN and recent depo injection here for heavy menstrual bleeding.   She reports vaginal bleeding with associated uterine cramping, both of which have improved over the past 2 weeks. She received a depo injection about 1 month ago and has not had this long of a period before. She is sexually active with one female partner and uses condoms every time. She denies vaginal discharge, focalized abdominal or back pain, dysuria, hematuria. She feels well and denies dizziness, syncope, palpitations, weakness.   She has not taken any medications in the past 3 days.   Review of Systems:  Per HPI. All other systems reviewed and are negative.    Past Medical History: Patient Active Problem List   Diagnosis Date Noted  . Menorrhagia 04/27/2013  . Contraception 03/22/2013  . Pain, dental 12/23/2012  . Loss of weight 10/24/2012  . Constipation - functional 07/30/2011  . High risk sexual behavior 11/25/2010  . Severe major depression with psychotic features 07/24/2010  . DIABETES MELLITUS, GESTATIONAL, HX OF 02/13/2009  . MIGRAINE HEADACHE 12/13/2007  . GERD 11/09/2007  . PAP SMEAR, ABNORMAL, ASCUS 09/19/2007  . Essential hypertension, benign 08/31/2007  . Morbid obesity with BMI of 45.0-49.9, adult 04/07/2006   Medications: reviewed and updated Current Outpatient Prescriptions  Medication Sig Dispense Refill  . citalopram (CELEXA) 40 MG tablet Take 1 tablet (40 mg total) by mouth daily.  30 tablet  2  . ibuprofen (ADVIL,MOTRIN) 600 MG tablet Take 1 tablet (600 mg total) by mouth every 6 (six) hours as needed.  30 tablet  0  . omeprazole (PRILOSEC) 20 MG capsule Take 2 capsules (40 mg total) by mouth daily.  60 capsule  5  . propranolol (INDERAL) 40 MG tablet Take 1 tablet (40 mg total) by mouth 2 (two) times daily.  180 tablet  3   No  current facility-administered medications for this visit.    Objective:  Physical Exam: BP 168/94  Pulse 52  Temp(Src) 98.9 F (37.2 C) (Oral)  Ht 5\' 2"  (1.575 m)  Wt 201 lb 12.8 oz (91.536 kg)  BMI 36.90 kg/m2  LMP 04/09/2013  Gen: Well-appearing 33 y.o. female in NAD HEENT: MMM, anicteric sclerae, no pallor CV: RRR, no MRG, no JVD Abd: Soft, NTND, BS present, no guarding, suprapubic tenderness, or organomegaly    Assessment:     Kendra Lintasha L Friis is a 33 y.o. female here for menorrhagia.     Plan:     See problem list for problem-specific plans.

## 2013-04-27 NOTE — Patient Instructions (Signed)
I think this will continue to resolve on its own, but we could start oral birth control pills to help if it does not. If it continues for another week, you develop worsening pain, or fever, please call the office to be seen or report to the ER.

## 2013-04-27 NOTE — Assessment & Plan Note (Addendum)
X 2 weeks s/p restarted depo. Seems to be resolving. Offered OCP, but pt wants to wait it out. No obvious anemia sxs, but last hgb 2011 was 11 and she has been menstruating heavily even before now, so will check CBC.

## 2013-04-27 NOTE — Assessment & Plan Note (Signed)
Not at goal, though she has not taken medication due to forgetfulness for the past 3 days. F/u with PCP for recheck.

## 2013-05-23 ENCOUNTER — Encounter: Payer: Self-pay | Admitting: Family Medicine

## 2013-05-23 ENCOUNTER — Ambulatory Visit (INDEPENDENT_AMBULATORY_CARE_PROVIDER_SITE_OTHER): Payer: Medicaid Other | Admitting: Family Medicine

## 2013-05-23 ENCOUNTER — Ambulatory Visit: Payer: Medicaid Other | Admitting: Family Medicine

## 2013-05-23 VITALS — BP 168/126 | HR 55 | Temp 98.6°F | Wt 208.4 lb

## 2013-05-23 DIAGNOSIS — N390 Urinary tract infection, site not specified: Secondary | ICD-10-CM

## 2013-05-23 DIAGNOSIS — R109 Unspecified abdominal pain: Secondary | ICD-10-CM | POA: Insufficient documentation

## 2013-05-23 LAB — POCT URINALYSIS DIPSTICK
Bilirubin, UA: NEGATIVE
Blood, UA: NEGATIVE
GLUCOSE UA: NEGATIVE
KETONES UA: NEGATIVE
Nitrite, UA: NEGATIVE
PROTEIN UA: NEGATIVE
Spec Grav, UA: 1.025
UROBILINOGEN UA: 0.2
pH, UA: 6.5

## 2013-05-23 LAB — POCT URINE PREGNANCY: PREG TEST UR: NEGATIVE

## 2013-05-23 MED ORDER — CEPHALEXIN 500 MG PO CAPS: 500.0000 mg | ORAL_CAPSULE | Freq: Three times a day (TID) | ORAL | Status: DC

## 2013-05-23 NOTE — Progress Notes (Signed)
Patient ID: Kendra Harris, female   DOB: March 09, 1980, 33 y.o.   MRN: [id number]    Subjective: HPI: Patient is a 33 y.o. female presenting to clinic today for same day appointment for left side/abd pain.  Abd pain- Started 2 days ago, unchanged. No new activities other than picking up daughter. Mild dysuria. No urinary frequency or urgency. No vaginal discharge. Dull pain, 5/10, does not radiate. She had similar pain to this when she had bladder infection. Normal bowel movements. No new medications, no new foods. No blood in urine or stool. No nausea, no headaches, no chest pain. Pain does not interfere with daily life.  Had IUD removed 3 months ago and had pain then also. She is on Depo now and has intermittent bleeding. She is unsure if pain is associated with spotting/bleeding.  History Reviewed: Non smoker.  ROS: Please see HPI above.  Objective: Office vital signs reviewed. Temp(Src) 98.6 F (37 C) (Oral)  Wt 208 lb 6.4 oz (94.53 kg)  LMP 04/09/2013  Physical Examination:  General: Awake, alert. NAD HEENT: Atraumatic, normocephalic. MMM Neck: No masses palpated. No LAD Pulm: CTAB, no wheezes Cardio: RRR, no murmurs appreciated Abdomen:+BS, soft, nondistended. Unable to illicit any pain with palpation. No peritoneal signs. Patient locates pain just superior to pelvic crest on left, but nontender even to deep palpation. Extremities: No edema Neuro: Grossly intact  Assessment: 33 y.o. female with abdominal pain  Plan: See Problem List and After Visit Summary

## 2013-05-23 NOTE — Assessment & Plan Note (Signed)
A: LLQ pain without red flags. UA shows large leukocytes. Upreg negative.  P: - Culture urine - Keflex 500mg  TID x1 week - Increase PO intake - F/u if fails to improve or gets worse, consider imaging given vague pain in vague location

## 2013-05-23 NOTE — Patient Instructions (Signed)
WE will call you if your urine culture shows anything different.  Please let us know if you are not better in the next few days, or if anything gets worse.  Drink lots of water while taking the antibiotic.  Milania Haubner M. Sumire Halbleib, M.D.

## 2013-05-25 LAB — URINE CULTURE

## 2013-05-30 ENCOUNTER — Ambulatory Visit: Payer: Medicaid Other

## 2013-06-06 ENCOUNTER — Ambulatory Visit (INDEPENDENT_AMBULATORY_CARE_PROVIDER_SITE_OTHER): Payer: Medicaid Other | Admitting: Family Medicine

## 2013-06-06 ENCOUNTER — Encounter: Payer: Self-pay | Admitting: Family Medicine

## 2013-06-06 VITALS — BP 190/108 | HR 59 | Temp 99.0°F | Wt 207.6 lb

## 2013-06-06 DIAGNOSIS — M25561 Pain in right knee: Secondary | ICD-10-CM

## 2013-06-06 DIAGNOSIS — M25569 Pain in unspecified knee: Secondary | ICD-10-CM

## 2013-06-06 MED ORDER — HYDROCODONE-ACETAMINOPHEN 5-325 MG PO TABS: 1.0000 | ORAL_TABLET | Freq: Four times a day (QID) | ORAL | Status: DC | PRN

## 2013-06-06 MED ORDER — MELOXICAM 15 MG PO TABS: 15.0000 mg | ORAL_TABLET | Freq: Every day | ORAL | Status: DC

## 2013-06-06 NOTE — Patient Instructions (Signed)
It was nice to see you today.  Please ice 3x daily.  Use compression and elevation daily as well.  Please try and abstain from physical activity. You can do your normal activities.

## 2013-06-06 NOTE — Assessment & Plan Note (Signed)
Appears to be secondary to a twisting injury and subsequent meniscal injury. [facility] ultrasound revealed large fluid collection in the suprapatellar pouch.  Patient was offered aspiration but declined. Advised compression, elevation and ice. Will treat pain with ibuprofen and Hydrocodone.   Follow up scheduled with SM next week for repeat US and potential aspiration if no improvement.

## 2013-06-06 NOTE — Progress Notes (Signed)
   Subjective:    Patient ID: Kendra Harris, female    DOB: 09/14/1980, 33 y.o.   MRN: [id number]  HPI 33 year old female presents to clinic today for evaluation of right knee pain and swelling.  Patient reports that her right knee became swollen and painful approximately 2 days ago.  Initially patient reported that she did not experience any recent fall, trauma, or injury.  However, after some thought she remembered twisting her knee while playing with her child.  Currently, her knee is very swollen and painful 6/10 in severity.  She reports decreased range of motion and inability to straighten her knee. She reports taking Tylenol little relief.    Review of Systems No recent fever, chills.  No reported redness or warmth of right knee.     Objective:   Physical Exam Exam: General: well appearing female in NAD.  Knee: Inspection - large, swollen knee.  No erythema appreciated.  Effusion noted.  Palpation -  no warmth. No joint line tenderness.  ROM significantly decreased - patient unable to flex more than 30 degrees; Full extension is also lacking by approximately 20 degrees.  Ligaments with solid consistent endpoints including ACL, PCL, LCL, MCL. Unable to perform McMurray's secondary to restricted range of motion and pain.  [facility] US:  Performed by Dr. Jennette KettleNeal - Large fluid collection noted in the suprapatellar pouch.    Assessment & Plan:  See Problem List

## 2013-06-15 ENCOUNTER — Other Ambulatory Visit: Payer: Medicaid Other | Admitting: Family Medicine

## 2013-06-29 ENCOUNTER — Ambulatory Visit (INDEPENDENT_AMBULATORY_CARE_PROVIDER_SITE_OTHER): Payer: Self-pay | Admitting: Family Medicine

## 2013-06-29 VITALS — BP 196/112 | HR 46 | Temp 98.2°F | Ht 62.0 in | Wt 206.0 lb

## 2013-06-29 DIAGNOSIS — R590 Localized enlarged lymph nodes: Secondary | ICD-10-CM | POA: Insufficient documentation

## 2013-06-29 DIAGNOSIS — R599 Enlarged lymph nodes, unspecified: Secondary | ICD-10-CM

## 2013-06-29 NOTE — Progress Notes (Signed)
Family Medicine Office Visit Note   Subjective:   Patient ID: Kendra Harris, female  DOB: [birth year], 33 y.o.. MRN: [id number]   Pt that comes today for same day appointment complaining of a "knot" she found on her neck last week. She reports had a hair color treatment done and noticed the bump appeared a day after. Denies fever, chills, sore throat or ear pain. She is non smoker and denies any recent illness. No other masses are found, she has hx of chronic migraine but denies any recent changes on the frequency or severity of her HA.   Review of Systems:  Pt denies SOB, chest pain, palpitations, dizziness, numbness or weakness.   Objective:   Physical Exam: Gen:  NAD HEENT: Moist mucous membranes. Oropharynx is normal color w/o exudates. Ears are normal canal with normal TM BL. Nose: no rhinorrhea, normal turbinates.  Neck ~ 0.5 cm mass on posterior aspect of left side of neck present. Oval in shape, regular borders, mobile, and rubbery consistency. No tender to palpation. Normal thyroid, no other masses.  CV: Regular rate and rhythm, no murmurs rubs or gallops PULM: Clear to auscultation bilaterally. No wheezes/rales/rhonchi ABD: Soft, non tender, non distended, normal bowel sounds EXT: No edema. No other adenopathies found on axilla, antecubital, inguinal and popliteal areas Neuro: Alert and oriented x3. No focalization  Assessment & Plan:

## 2013-06-29 NOTE — Assessment & Plan Note (Signed)
Small, per exam appears benign. Pt is non smoker and negative family history of ca. Most likely secondary to her hair color treatment. Discussed signs that should prompt re-evaluation. F/u in one month otherwise.

## 2013-06-29 NOTE — Patient Instructions (Signed)
The adenopathy on your neck seems benign in nature, but if you notice increase in size, hardening, appearance of other lesions or other symptoms please come back for re-evaluation. Otherwise follow up in one month.

## 2013-07-27 ENCOUNTER — Encounter: Payer: Self-pay | Admitting: Family Medicine

## 2013-07-27 ENCOUNTER — Ambulatory Visit (INDEPENDENT_AMBULATORY_CARE_PROVIDER_SITE_OTHER): Payer: Medicaid Other | Admitting: Family Medicine

## 2013-07-27 VITALS — BP 144/98 | HR 60 | Temp 98.1°F | Resp 18 | Wt 205.0 lb

## 2013-07-27 DIAGNOSIS — R319 Hematuria, unspecified: Secondary | ICD-10-CM

## 2013-07-27 DIAGNOSIS — R300 Dysuria: Secondary | ICD-10-CM

## 2013-07-27 DIAGNOSIS — N390 Urinary tract infection, site not specified: Secondary | ICD-10-CM | POA: Insufficient documentation

## 2013-07-27 DIAGNOSIS — IMO0001 Reserved for inherently not codable concepts without codable children: Secondary | ICD-10-CM | POA: Insufficient documentation

## 2013-07-27 LAB — POCT URINE PREGNANCY: Preg Test, Ur: NEGATIVE

## 2013-07-27 LAB — POCT URINALYSIS DIPSTICK
Glucose, UA: NEGATIVE
KETONES UA: NEGATIVE
NITRITE UA: NEGATIVE
PH UA: 6
PROTEIN UA: 30
Spec Grav, UA: >=1.03
UROBILINOGEN UA: 1

## 2013-07-27 NOTE — Assessment & Plan Note (Signed)
Symptoms of cystitis and UA positive for hematuria and leukocytes. Will try to reach pt in order to discuss plan Urine culture has been ordered as well.
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
## 2013-07-27 NOTE — Assessment & Plan Note (Signed)
Noticed in clinic today and several other readings. Pt needs to f/u with primary doctor for this.

## 2013-07-30 ENCOUNTER — Telehealth: Payer: Self-pay | Admitting: Emergency Medicine

## 2013-07-30 NOTE — Telephone Encounter (Signed)
Fwd to doctor that patient was seen by on Friday-Kendra Harris

## 2013-07-30 NOTE — Telephone Encounter (Signed)
Patient would like urine results from Friday 6/19.

## 2013-07-31 ENCOUNTER — Other Ambulatory Visit: Payer: Self-pay | Admitting: Family Medicine

## 2013-07-31 MED ORDER — CEPHALEXIN 500 MG PO CAPS: 500.0000 mg | ORAL_CAPSULE | Freq: Three times a day (TID) | ORAL | Status: DC

## 2013-09-05 ENCOUNTER — Encounter (HOSPITAL_COMMUNITY): Payer: Self-pay | Admitting: Emergency Medicine

## 2013-09-05 ENCOUNTER — Emergency Department (HOSPITAL_COMMUNITY)
Admission: EM | Admit: 2013-09-05 | Discharge: 2013-09-05 | Discharge status: Against Medical Advice | Payer: Medicaid Other | Attending: Emergency Medicine | Admitting: Emergency Medicine

## 2013-09-05 DIAGNOSIS — Y92410 Unspecified street and highway as the place of occurrence of the external cause: Secondary | ICD-10-CM | POA: Diagnosis not present

## 2013-09-05 DIAGNOSIS — S0990XA Unspecified injury of head, initial encounter: Secondary | ICD-10-CM | POA: Diagnosis present

## 2013-09-05 DIAGNOSIS — Y9389 Activity, other specified: Secondary | ICD-10-CM | POA: Diagnosis not present

## 2013-09-05 DIAGNOSIS — Z5321 Procedure and treatment not carried out due to patient leaving prior to being seen by health care provider: Secondary | ICD-10-CM

## 2013-09-05 HISTORY — DX: Essential (primary) hypertension: I10

## 2013-09-05 NOTE — ED Provider Notes (Signed)
Patient left without being seen.   Jeannetta EllisJennifer L Salsabeel Gorelick, PA-C 09/05/13 [id number]
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
## 2013-09-05 NOTE — ED Provider Notes (Signed)
Medical screening examination/treatment/procedure(s) were performed by non-physician practitioner and as supervising physician I was immediately available for consultation/collaboration.     Kendra Lyonsouglas Damascus Feldpausch, MD 09/05/13 [phone]

## 2013-09-05 NOTE — ED Notes (Signed)
Pt states that she has to pick daughter up at 1230 and has no one to go get daughter, pt requesting to leave and come back later. Pt informed she will have to be triaged and wait for a room again, pt verbalized understanding. Pt axo, nad noted. Pt ambulatory to bathroom. Pt has yet to be seen by physician.

## 2013-09-07 ENCOUNTER — Ambulatory Visit (INDEPENDENT_AMBULATORY_CARE_PROVIDER_SITE_OTHER): Payer: Medicaid Other | Admitting: Family Medicine

## 2013-09-07 ENCOUNTER — Encounter: Payer: Self-pay | Admitting: Family Medicine

## 2013-09-07 VITALS — BP 164/102 | HR 43 | Wt 216.0 lb

## 2013-09-07 DIAGNOSIS — S161XXA Strain of muscle, fascia and tendon at neck level, initial encounter: Secondary | ICD-10-CM

## 2013-09-07 DIAGNOSIS — S139XXA Sprain of joints and ligaments of unspecified parts of neck, initial encounter: Secondary | ICD-10-CM

## 2013-09-07 MED ORDER — CYCLOBENZAPRINE HCL 10 MG PO TABS: 10.0000 mg | ORAL_TABLET | Freq: Three times a day (TID) | ORAL | Status: DC | PRN
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
## 2013-09-07 NOTE — Patient Instructions (Signed)
Ms Senaida OresRichardson it was great to meet you and your daughter today!  I am sorry to hear about your neck pain I think most likely you have a muscle strain  You may cont to take ibuprofen as needed  Also consider flexeril up to three times a day as needed  Please call if you have worsening pain, numbness, tingling, and headaches   Looking forward to seeing you soon Charlane FerrettiMelanie C Luvada Salamone, MD
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
## 2014-01-02 ENCOUNTER — Other Ambulatory Visit: Payer: Self-pay | Admitting: Family Medicine

## 2014-01-02 MED ORDER — PROPRANOLOL HCL 40 MG PO TABS: 40.0000 mg | ORAL_TABLET | Freq: Two times a day (BID) | ORAL | Status: DC

## 2014-01-02 NOTE — Telephone Encounter (Signed)
Care taker called and said that Kendra Harris needs a refill on her Inderal. They have an appointment for 12/2. Please call care taker at [phone]. jw

## 2014-01-02 NOTE — Telephone Encounter (Signed)
Refilled med, will ask nursing to follow up with care-taker.   Murtis SinkSam Lacey Wallman, MD [facility] Resident, PGY-3 01/02/2014, 12:00 PM

## 2014-01-09 ENCOUNTER — Ambulatory Visit: Payer: Medicaid Other | Admitting: Family Medicine

## 2014-01-24 ENCOUNTER — Encounter: Payer: Self-pay | Admitting: Family Medicine

## 2014-01-24 ENCOUNTER — Ambulatory Visit (INDEPENDENT_AMBULATORY_CARE_PROVIDER_SITE_OTHER): Payer: Medicaid Other | Admitting: Family Medicine

## 2014-01-24 VITALS — BP 157/80 | HR 59 | Temp 98.1°F | Ht 62.0 in

## 2014-01-24 DIAGNOSIS — M25512 Pain in left shoulder: Secondary | ICD-10-CM

## 2014-01-24 DIAGNOSIS — M25511 Pain in right shoulder: Secondary | ICD-10-CM | POA: Insufficient documentation

## 2014-01-24 MED ORDER — CYCLOBENZAPRINE HCL 10 MG PO TABS: 10.0000 mg | ORAL_TABLET | Freq: Three times a day (TID) | ORAL | Status: DC | PRN

## 2014-01-24 MED ORDER — MELOXICAM 15 MG PO TABS: 15.0000 mg | ORAL_TABLET | Freq: Every day | ORAL | Status: DC

## 2014-01-24 NOTE — Patient Instructions (Signed)
It was nice to see you today.  I have prescribed an antiinflammatory (Mobic) and Flexeril for you shoulder.  Take as prescribed.  Follow up if you fail to improve or worsen.  Happy Holidays  Dr. Adriana Simasook   Shoulder Pain The shoulder is the joint that connects your arms to your body. The bones that form the shoulder joint include the upper arm bone (humerus), the shoulder blade (scapula), and the collarbone (clavicle). The top of the humerus is shaped like a ball and fits into a rather flat socket on the scapula (glenoid cavity). A combination of muscles and strong, fibrous tissues that connect muscles to bones (tendons) support your shoulder joint and hold the ball in the socket. Small, fluid-filled sacs (bursae) are located in different areas of the joint. They act as cushions between the bones and the overlying soft tissues and help reduce friction between the gliding tendons and the bone as you move your arm. Your shoulder joint allows a wide range of motion in your arm. This range of motion allows you to do things like scratch your back or throw a ball. However, this range of motion also makes your shoulder more prone to pain from overuse and injury. Causes of shoulder pain can originate from both injury and overuse and usually can be grouped in the following four categories:  Redness, swelling, and pain (inflammation) of the tendon (tendinitis) or the bursae (bursitis).  Instability, such as a dislocation of the joint.  Inflammation of the joint (arthritis).  Broken bone (fracture). HOME CARE INSTRUCTIONS   Apply ice to the sore area.  Put ice in a plastic bag.  Place a towel between your skin and the bag.  Leave the ice on for 15-20 minutes, 3-4 times per day for the first 2 days, or as directed by your health care provider.  Stop using cold packs if they do not help with the pain.  If you have a shoulder sling or immobilizer, wear it as long as your caregiver instructs. Only  remove it to shower or bathe. Move your arm as little as possible, but keep your hand moving to prevent swelling.  Squeeze a soft ball or foam pad as much as possible to help prevent swelling.  Only take over-the-counter or prescription medicines for pain, discomfort, or fever as directed by your caregiver. SEEK MEDICAL CARE IF:   Your shoulder pain increases, or new pain develops in your arm, hand, or fingers.  Your hand or fingers become cold and numb.  Your pain is not relieved with medicines. SEEK IMMEDIATE MEDICAL CARE IF:   Your arm, hand, or fingers are numb or tingling.  Your arm, hand, or fingers are significantly swollen or turn white or blue. MAKE SURE YOU:   Understand these instructions.  Will watch your condition.  Will get help right away if you are not doing well or get worse. Document Released: 11/04/2004 Document Revised: 06/11/2013 Document Reviewed: 01/09/2011 [facility] Patient Information 2015 [place]. This information is not intended to replace advice given to you by your health care provider. Make sure you discuss any questions you have with your health care provider.

## 2014-01-24 NOTE — Progress Notes (Signed)
   Subjective:    Patient ID: Kendra Harris, female    DOB: 11/29/1980, 33 y.o.   MRN: [id number]  HPI 33 year old female presents for same day appointment with complaints of left shoulder pain.  1) Shoulder pain - Left  Patient reports that she developed left shoulder pain yesterday.  Pain is located in the anterior left shoulder.  Pain is described as achy in 6 out of 10 in severity.  No reported fall, trauma, injury. Patient is unaware of the inciting factor for the pain.  She denies any associated weakness.  She reports that she has had this before and it improved with muscle relaxants.  She has tried Tylenol and ibuprofen without significant relief. Pain is exacerbated by movement.  No recent fevers, chills. No other complaints this time.  Review of Systems Per HPI    Objective:   Physical Exam Filed Vitals:   01/24/14 0913  BP: 157/80  Pulse: 59  Temp: 98.1 F (36.7 C)   General: Well-appearing obese female in no acute distress. Shoulder: Left Inspection reveals no abnormalities, atrophy or asymmetry. Palpation - tenderness noted over the bicipital groove. ROM is full in all planes.  Patient did have some pain with overhead motion. Rotator cuff strength normal throughout. Positive Hawkins test. Negative empty can. Speeds  test positive. Yergason's test negative.    Assessment & Plan:  See problem list

## 2014-01-24 NOTE — Assessment & Plan Note (Addendum)
Biceps tendinopathy versus mild bursitis. Treating conservatively with Flexeril and mobile. Patient to follow-up if she fails to improve as she may benefit from injection.

## 2014-02-08 NOTE — L&D Delivery Note (Cosign Needed)
Delivery Note Called for delivery, walked in and baby's head was out w/ compound Lt hand/arm, and at 8:34 AM the remainder of the body was delivered by me.  viable female was delivered via  (Presentation: LOA w/ compound Lt hand/arm and cord wrapped around Rt arm).  APGAR: 8, 10; weight: pending at time of note.  Infant placed directly on mom's abdomen for bonding/skin-to-skin. Delayed cord clamping and then cord was clamped x 2, and cut by family member.     Placenta status: Intact, Spontaneous.  Cord:  with the following complications: none.  Cord pH: h/a  Anesthesia:  none Episiotomy:  none Lacerations:  nopne Suture Repair: n/a Est. Blood Loss (mL):    Mom to postpartum.  Baby to Couplet care / Skin to Skin. Plans to bottlefeed, BTL for contraception- papers just signed 09/16/14 so will need to be interval BTL, OP circ Will stop labetalol and restart her pre-pregnancy propranolol  BID for [facility] 09/21/2014, 8:57 AM

## 2014-02-18 ENCOUNTER — Encounter: Payer: Self-pay | Admitting: Family Medicine

## 2014-02-18 ENCOUNTER — Ambulatory Visit (INDEPENDENT_AMBULATORY_CARE_PROVIDER_SITE_OTHER): Payer: Medicaid Other | Admitting: Family Medicine

## 2014-02-18 VITALS — BP 134/95 | HR 60 | Temp 98.4°F | Resp 16 | Wt 227.0 lb

## 2014-02-18 DIAGNOSIS — Z3200 Encounter for pregnancy test, result unknown: Secondary | ICD-10-CM

## 2014-02-18 DIAGNOSIS — O10011 Pre-existing essential hypertension complicating pregnancy, first trimester: Secondary | ICD-10-CM

## 2014-02-18 DIAGNOSIS — O161 Unspecified maternal hypertension, first trimester: Secondary | ICD-10-CM

## 2014-02-18 LAB — POCT URINE PREGNANCY: Preg Test, Ur: POSITIVE

## 2014-02-18 MED ORDER — LABETALOL HCL 200 MG PO TABS
200.0000 mg | ORAL_TABLET | Freq: Two times a day (BID) | ORAL | Status: DC
Start: 2014-02-18 — End: 2015-07-11

## 2014-02-18 MED ORDER — CONCEPT OB 130-92.4-1 MG PO CAPS: 1.0000 | ORAL_CAPSULE | Freq: Every day | ORAL | Status: DC

## 2014-02-18 NOTE — Patient Instructions (Signed)
First Trimester of Pregnancy The first trimester of pregnancy is from week 1 until the end of week 12 (months 1 through 3). A week after a sperm fertilizes an egg, the egg will implant on the wall of the uterus. This embryo will begin to develop into a baby. Genes from you and your partner are forming the baby. The female genes determine whether the baby is a boy or a girl. At 6-8 weeks, the eyes and face are formed, and the heartbeat can be seen on ultrasound. At the end of 12 weeks, all the baby's organs are formed.  Now that you are pregnant, you will want to do everything you can to have a healthy baby. Two of the most important things are to get good prenatal care and to follow your health care provider's instructions. Prenatal care is all the medical care you receive before the baby's birth. This care will help prevent, find, and treat any problems during the pregnancy and childbirth. BODY CHANGES Your body goes through many changes during pregnancy. The changes vary from woman to woman.   You may gain or lose a couple of pounds at first.  You may feel sick to your stomach (nauseous) and throw up (vomit). If the vomiting is uncontrollable, call your health care provider.  You may tire easily.  You may develop headaches that can be relieved by medicines approved by your health care provider.  You may urinate more often. Painful urination may mean you have a bladder infection.  You may develop heartburn as a result of your pregnancy.  You may develop constipation because certain hormones are causing the muscles that push waste through your intestines to slow down.  You may develop hemorrhoids or swollen, bulging veins (varicose veins).  Your breasts may begin to grow larger and become tender. Your nipples may stick out more, and the tissue that surrounds them (areola) may become darker.  Your gums may bleed and may be sensitive to brushing and flossing.  Dark spots or blotches (chloasma,  mask of pregnancy) may develop on your face. This will likely fade after the baby is born.  Your menstrual periods will stop.  You may have a loss of appetite.  You may develop cravings for certain kinds of food.  You may have changes in your emotions from day to day, such as being excited to be pregnant or being concerned that something may go wrong with the pregnancy and baby.  You may have more vivid and strange dreams.  You may have changes in your hair. These can include thickening of your hair, rapid growth, and changes in texture. Some women also have hair loss during or after pregnancy, or hair that feels dry or thin. Your hair will most likely return to normal after your baby is born. WHAT TO EXPECT AT YOUR PRENATAL VISITS During a routine prenatal visit:  You will be weighed to make sure you and the baby are growing normally.  Your blood pressure will be taken.  Your abdomen will be measured to track your baby's growth.  The fetal heartbeat will be listened to starting around week 10 or 12 of your pregnancy.  Test results from any previous visits will be discussed. Your health care provider may ask you:  How you are feeling.  If you are feeling the baby move.  If you have had any abnormal symptoms, such as leaking fluid, bleeding, severe headaches, or abdominal cramping.  If you have any questions. Other tests   that may be performed during your first trimester include:  Blood tests to find your blood type and to check for the presence of any previous infections. They will also be used to check for low iron levels (anemia) and Rh antibodies. Later in the pregnancy, blood tests for diabetes will be done along with other tests if problems develop.  Urine tests to check for infections, diabetes, or protein in the urine.  An ultrasound to confirm the proper growth and development of the baby.  An amniocentesis to check for possible genetic problems.  Fetal screens for  spina bifida and Down syndrome.  You may need other tests to make sure you and the baby are doing well. HOME CARE INSTRUCTIONS  Medicines  Follow your health care provider's instructions regarding medicine use. Specific medicines may be either safe or unsafe to take during pregnancy.  Take your prenatal vitamins as directed.  If you develop constipation, try taking a stool softener if your health care provider approves. Diet  Eat regular, well-balanced meals. Choose a variety of foods, such as meat or vegetable-based protein, fish, milk and low-fat dairy products, vegetables, fruits, and whole grain breads and cereals. Your health care provider will help you determine the amount of weight gain that is right for you.  Avoid raw meat and uncooked cheese. These carry germs that can cause birth defects in the baby.  Eating four or five small meals rather than three large meals a day may help relieve nausea and vomiting. If you start to feel nauseous, eating a few soda crackers can be helpful. Drinking liquids between meals instead of during meals also seems to help nausea and vomiting.  If you develop constipation, eat more high-fiber foods, such as fresh vegetables or fruit and whole grains. Drink enough fluids to keep your urine clear or pale yellow. Activity and Exercise  Exercise only as directed by your health care provider. Exercising will help you:  Control your weight.  Stay in shape.  Be prepared for labor and delivery.  Experiencing pain or cramping in the lower abdomen or low back is a good sign that you should stop exercising. Check with your health care provider before continuing normal exercises.  Try to avoid standing for long periods of time. Move your legs often if you must stand in one place for a long time.  Avoid heavy lifting.  Wear low-heeled shoes, and practice good posture.  You may continue to have sex unless your health care provider directs you  otherwise. Relief of Pain or Discomfort  Wear a good support bra for breast tenderness.   Take warm sitz baths to soothe any pain or discomfort caused by hemorrhoids. Use hemorrhoid cream if your health care provider approves.   Rest with your legs elevated if you have leg cramps or low back pain.  If you develop varicose veins in your legs, wear support hose. Elevate your feet for 15 minutes, 3-4 times a day. Limit salt in your diet. Prenatal Care  Schedule your prenatal visits by the twelfth week of pregnancy. They are usually scheduled monthly at first, then more often in the last 2 months before delivery.  Write down your questions. Take them to your prenatal visits.  Keep all your prenatal visits as directed by your health care provider. Safety  Wear your seat belt at all times when driving.  Make a list of emergency phone numbers, including numbers for family, friends, the hospital, and police and fire departments. General Tips    Ask your health care provider for a referral to a local prenatal education class. Begin classes no later than at the beginning of month 6 of your pregnancy.  Ask for help if you have counseling or nutritional needs during pregnancy. Your health care provider can offer advice or refer you to specialists for help with various needs.  Do not use hot tubs, steam rooms, or saunas.  Do not douche or use tampons or scented sanitary pads.  Do not cross your legs for long periods of time.  Avoid cat litter boxes and soil used by cats. These carry germs that can cause birth defects in the baby and possibly loss of the fetus by miscarriage or stillbirth.  Avoid all smoking, herbs, alcohol, and medicines not prescribed by your health care provider. Chemicals in these affect the formation and growth of the baby.  Schedule a dentist appointment. At home, brush your teeth with a soft toothbrush and be gentle when you floss. SEEK MEDICAL CARE IF:   You have  dizziness.  You have mild pelvic cramps, pelvic pressure, or nagging pain in the abdominal area.  You have persistent nausea, vomiting, or diarrhea.  You have a bad smelling vaginal discharge.  You have pain with urination.  You notice increased swelling in your face, hands, legs, or ankles. SEEK IMMEDIATE MEDICAL CARE IF:   You have a fever.  You are leaking fluid from your vagina.  You have spotting or bleeding from your vagina.  You have severe abdominal cramping or pain.  You have rapid weight gain or loss.  You vomit blood or material that looks like coffee grounds.  You are exposed to German measles and have never had them.  You are exposed to fifth disease or chickenpox.  You develop a severe headache.  You have shortness of breath.  You have any kind of trauma, such as from a fall or a car accident. Document Released: 01/19/2001 Document Revised: 06/11/2013 Document Reviewed: 12/05/2012 [employer] Patient Information 2015 [employer]. This information is not intended to replace advice given to you by your health care provider. Make sure you discuss any questions you have with your health care provider.  

## 2014-02-18 NOTE — Progress Notes (Signed)
    Subjective:    Patient ID: Kendra Harris is a 34 y.o. female presenting with Possible Pregnancy  on 02/18/2014  HPI: G3P3 Reports on Depo until 1/15. Got her cycle back in Aug.  Nml menses Aug, Sept, Oct, Nov.  LMP 12/28/13. No cycle in December.  Neg UPT at end of December.  Unsure about pregnancy symptoms. Not currently on contraception, does not want to be pregnant.  Review of Systems  Constitutional: Negative for fever and chills.  Respiratory: Negative for shortness of breath.   Cardiovascular: Negative for chest pain.  Gastrointestinal: Negative for nausea, vomiting and abdominal pain.  Genitourinary: Negative for dysuria.  Skin: Negative for rash.      Objective:    BP 134/95 mmHg  Pulse 60  Temp(Src) 98.4 F (36.9 C) (Oral)  Resp 16  Wt 227 lb (102.967 kg)  SpO2 97%  LMP 12/28/2013 Physical Exam  Constitutional: She is oriented to person, place, and time. She appears well-developed and well-nourished. No distress.  HENT:  Head: Normocephalic and atraumatic.  Eyes: No scleral icterus.  Neck: Neck supple.  Cardiovascular: Normal rate.   Pulmonary/Chest: Effort normal.  Abdominal: Soft.  Neurological: She is alert and oriented to person, place, and time.  Skin: Skin is warm and dry.  Psychiatric: She has a normal mood and affect.   UPT positive     Assessment & Plan:   Possible pregnancy - Begin prenatal vitamins and obtain care. - Plan: POCT urine pregnancy, Prenat w/o A Vit-FeFum-FePo-FA (CONCEPT OB) 130-92.4-1 MG CAPS  Benign essential hypertension affecting pregnancy in first trimester - Switch to Labetalol for pregnancy--[facility] - Plan: labetalol (NORMODYNE) 200 MG tablet   Return in about 4 weeks (around 03/18/2014).

## 2014-03-06 ENCOUNTER — Other Ambulatory Visit: Payer: Medicaid Other

## 2014-03-07 ENCOUNTER — Encounter: Payer: Medicaid Other | Admitting: Family Medicine

## 2014-03-11 NOTE — Progress Notes (Signed)
Erroneous encounter

## 2014-03-13 ENCOUNTER — Encounter: Payer: Medicaid Other | Admitting: Family Medicine

## 2014-03-25 ENCOUNTER — Encounter: Payer: Medicaid Other | Admitting: Family Medicine

## 2014-04-01 ENCOUNTER — Telehealth: Payer: Self-pay | Admitting: Family Medicine

## 2014-04-01 NOTE — Telephone Encounter (Signed)
Returned call to patient regarding her pregnancy.  Pt wanted to know when she will be able to feel movements.  Pt advised it normally will occur around 16-20 weeks of pregnancy. She might feel little flutter feeling.  Pt stated she is about 4 months.  Pt advised she start having severe abdominal cramps and bleeding [facility]  Pt stated understanding.  Clovis PuMartin, Dhaval Woo L, RN

## 2014-04-01 NOTE — Telephone Encounter (Signed)
Pt would like to talk to someone about her pregnancy, she has some concerns / thanks sr

## 2014-04-02 ENCOUNTER — Encounter: Payer: Medicaid Other | Admitting: Family Medicine

## 2014-04-03 ENCOUNTER — Other Ambulatory Visit: Payer: Medicaid Other

## 2014-04-08 ENCOUNTER — Inpatient Hospital Stay (HOSPITAL_COMMUNITY)
Admission: AD | Admit: 2014-04-08 | Discharge: 2014-04-08 | Disposition: A | Discharge status: Home / Self Care | Payer: Medicaid Other | Source: Ambulatory Visit | Attending: Obstetrics and Gynecology | Admitting: Obstetrics and Gynecology

## 2014-04-08 ENCOUNTER — Encounter (HOSPITAL_COMMUNITY): Payer: Self-pay | Admitting: *Deleted

## 2014-04-08 DIAGNOSIS — O368120 Decreased fetal movements, second trimester, not applicable or unspecified: Secondary | ICD-10-CM | POA: Insufficient documentation

## 2014-04-08 DIAGNOSIS — Z3A12 12 weeks gestation of pregnancy: Secondary | ICD-10-CM | POA: Insufficient documentation

## 2014-04-08 DIAGNOSIS — O10911 Unspecified pre-existing hypertension complicating pregnancy, first trimester: Secondary | ICD-10-CM

## 2014-04-08 DIAGNOSIS — O10011 Pre-existing essential hypertension complicating pregnancy, first trimester: Secondary | ICD-10-CM | POA: Diagnosis not present

## 2014-04-08 DIAGNOSIS — I10 Essential (primary) hypertension: Secondary | ICD-10-CM

## 2014-04-08 DIAGNOSIS — Z3A11 11 weeks gestation of pregnancy: Secondary | ICD-10-CM

## 2014-04-08 LAB — URINE MICROSCOPIC-ADD ON

## 2014-04-08 LAB — URINALYSIS, ROUTINE W REFLEX MICROSCOPIC
Bilirubin Urine: NEGATIVE
Glucose, UA: NEGATIVE mg/dL
Hgb urine dipstick: NEGATIVE
Ketones, ur: NEGATIVE mg/dL
NITRITE: NEGATIVE
PROTEIN: NEGATIVE mg/dL
SPECIFIC GRAVITY, URINE: 1.02 (ref 1.005–1.030)
Urobilinogen, UA: 0.2 mg/dL (ref 0.0–1.0)
pH: 6 (ref 5.0–8.0)

## 2014-04-08 NOTE — MAU Note (Signed)
Urine in lab 

## 2014-04-08 NOTE — MAU Provider Note (Signed)
History     CSN: [id number]  Arrival date and time: 04/08/14 0917   First Provider Initiated Contact with Patient 04/08/14 1001      Chief Complaint  Patient presents with  . Decreased Fetal Movement   HPI  [name] is a 34 y.o. female [phone] at [redacted]w[redacted]d who presents with decreased fetal movement.   She is scheduled in the high risk clinic on Thursday for her first prenatal appointment.   LMP unsure, however sometime in the beginning - middle of December. Patient had a negative pregnancy test at the end of December. She saw Dr. Shawnie Pons at [facility] family practice on 1/11 and pregnancy test was positive.   She thought she was farther along than she is; she feels reassured that we were able to doppler fetal heart tones today.  History of chronic HTN; taking labetalol, She just took her morning dose while in [place].   Denies vaginal bleeding or abdominal pain.    OB History    Gravida Para Term Preterm AB TAB SAB Ectopic Multiple Living   Past Medical History  Diagnosis Date  . DELAYED MENSES 02/11/2010    Qualifier: Diagnosis of  By: Kendra Huh  MD, Kendra Harris    . Urinary tract infection 03/04/2011  . Hypertension     Past Surgical History  Procedure Laterality Date  . No past surgeries      No family history on file.  History  Substance Use Topics  . Smoking status: Never Smoker   . Smokeless tobacco: Not on file  . Alcohol Use: No    Allergies: No Known Allergies  Prescriptions prior to admission  Medication Sig Dispense Refill Last Dose  . labetalol (NORMODYNE) 200 MG tablet Take 1 tablet (200 mg total) by mouth 2 (two) times daily. 60 tablet 3 04/08/2014 at 0900  . Prenat w/o A Vit-FeFum-FePo-FA (CONCEPT OB) 130-92.4-1 MG CAPS Take 1 capsule by mouth daily. 30 capsule 3 04/08/2014 at Unknown time   Results for orders placed or performed during the hospital encounter of 04/08/14 (from the past 48 hour(s))  Urinalysis, Routine w reflex  microscopic     Status: Abnormal   Collection Time: 04/08/14  9:35 AM  Result Value Ref Range   Color, Urine YELLOW YELLOW   APPearance CLEAR CLEAR   Specific Gravity, Urine 1.020 1.005 - 1.030   pH 6.0 5.0 - 8.0   Glucose, UA NEGATIVE NEGATIVE mg/dL   Hgb urine dipstick NEGATIVE NEGATIVE   Bilirubin Urine NEGATIVE NEGATIVE   Ketones, ur NEGATIVE NEGATIVE mg/dL   Protein, ur NEGATIVE NEGATIVE mg/dL   Urobilinogen, UA 0.2 0.0 - 1.0 mg/dL   Nitrite NEGATIVE NEGATIVE   Leukocytes, UA SMALL (A) NEGATIVE  Urine microscopic-add on     Status: Abnormal   Collection Time: 04/08/14  9:35 AM  Result Value Ref Range   Squamous Epithelial / LPF FEW (A) RARE   WBC, UA 3-6 <3 WBC/hpf   RBC / HPF 0-2 <3 RBC/hpf   Bacteria, UA FEW (A) RARE   Crystals CA OXALATE CRYSTALS (A) NEGATIVE    Review of Systems  Constitutional: Negative for fever and chills.  Eyes: Negative for blurred vision.  Cardiovascular: Negative for chest pain.  Gastrointestinal: Negative for nausea and vomiting.  Genitourinary: Negative for dysuria, urgency and frequency.  Neurological: Negative for headaches.   Physical Exam   Blood pressure 137/85, pulse 57,  temperature 99.2 F (37.3 C), temperature source Oral, resp. rate 16, height 5' 1.5" (1.562 m), weight 105.235 kg (232 lb), last menstrual period 01/14/2014.  Physical Exam  Nursing note and vitals reviewed. Constitutional: She is oriented to person, place, and time. She appears well-developed and well-nourished. No distress.  HENT:  Head: Normocephalic.  Eyes: Pupils are equal, round, and reactive to light.  Neck: Neck supple.  Cardiovascular: Normal rate and normal heart sounds.   Respiratory: Effort normal and breath sounds normal. No respiratory distress.  GI: Soft.  Musculoskeletal: Normal range of motion.  Neurological: She is alert and oriented to person, place, and time.  Skin: Skin is warm. She is not diaphoretic.  Psychiatric: Her behavior is  normal.    MAU Course  Procedures  None  MDM + fetal heart tones  UA  Discussed fetal kick counts with the patient and when she can expect to feel flutters- regular fetal movements   Assessment and Plan   A:  1. Chronic hypertension complicating or reason for care during pregnancy, first trimester   2.      Decreased fetal movement in the first trimester  P:  Discharge home in stable condition Keep your scheduled appointment in the clinic on Thursday Preeclampsia precautions Return to MAU if symptoms worsen  Continue labetalol as prescribed    Kendra HansenJennifer Irene Rasch, NP 04/08/2014 10:18 AM

## 2014-04-08 NOTE — MAU Note (Addendum)
Pt unsure of due date.  Has started care, but they did not tell her, thinks she is about 4mon preg.  Explained to pt that is too early for FM. No bleeding, no pain.  Next appt is Thurs

## 2014-04-08 NOTE — Discharge Instructions (Signed)
DASH Eating Plan °DASH stands for "Dietary Approaches to Stop Hypertension." The DASH eating plan is a healthy eating plan that has been shown to reduce high blood pressure (hypertension). Additional health benefits may include reducing the risk of type 2 diabetes mellitus, heart disease, and stroke. The DASH eating plan may also help with weight loss. °WHAT DO I NEED TO KNOW ABOUT THE DASH EATING PLAN? °For the DASH eating plan, you will follow these general guidelines: °· Choose foods with a percent daily value for sodium of less than 5% (as listed on the food label). °· Use salt-free seasonings or herbs instead of table salt or sea salt. °· Check with your health care provider or pharmacist before using salt substitutes. °· Eat lower-sodium products, often labeled as "lower sodium" or "no salt added." °· Eat fresh foods. °· Eat more vegetables, fruits, and low-fat dairy products. °· Choose whole grains. Look for the word "whole" as the first word in the ingredient list. °· Choose fish and skinless chicken or turkey more often than red meat. Limit fish, poultry, and meat to 6 oz (170 g) each day. °· Limit sweets, desserts, sugars, and sugary drinks. °· Choose heart-healthy fats. °· Limit cheese to 1 oz (28 g) per day. °· Eat more home-cooked food and less restaurant, buffet, and fast food. °· Limit fried foods. °· Cook foods using methods other than frying. °· Limit canned vegetables. If you do use them, rinse them well to decrease the sodium. °· When eating at a restaurant, ask that your food be prepared with less salt, or no salt if possible. °WHAT FOODS CAN I EAT? °Seek help from a dietitian for individual calorie needs. °Grains °Whole grain or whole wheat bread. Brown rice. Whole grain or whole wheat pasta. Quinoa, bulgur, and whole grain cereals. Low-sodium cereals. Corn or whole wheat flour tortillas. Whole grain cornbread. Whole grain crackers. Low-sodium crackers. °Vegetables °Fresh or frozen vegetables  (raw, steamed, roasted, or grilled). Low-sodium or reduced-sodium tomato and vegetable juices. Low-sodium or reduced-sodium tomato sauce and paste. Low-sodium or reduced-sodium canned vegetables.  °Fruits °All fresh, canned (in natural juice), or frozen fruits. °Meat and Other Protein Products °Ground beef (85% or leaner), grass-fed beef, or beef trimmed of fat. Skinless chicken or turkey. Ground chicken or turkey. Pork trimmed of fat. All fish and seafood. Eggs. Dried beans, peas, or lentils. Unsalted nuts and seeds. Unsalted canned beans. °Dairy °Low-fat dairy products, such as skim or 1% milk, 2% or reduced-fat cheeses, low-fat ricotta or cottage cheese, or plain low-fat yogurt. Low-sodium or reduced-sodium cheeses. °Fats and Oils °Tub margarines without trans fats. Light or reduced-fat mayonnaise and salad dressings (reduced sodium). Avocado. Safflower, olive, or canola oils. Natural peanut or almond butter. °Other °Unsalted popcorn and pretzels. °The items listed above may not be a complete list of recommended foods or beverages. Contact your dietitian for more options. °WHAT FOODS ARE NOT RECOMMENDED? °Grains °White bread. White pasta. White rice. Refined cornbread. Bagels and croissants. Crackers that contain trans fat. °Vegetables °Creamed or fried vegetables. Vegetables in a cheese sauce. Regular canned vegetables. Regular canned tomato sauce and paste. Regular tomato and vegetable juices. °Fruits °Dried fruits. Canned fruit in light or heavy syrup. Fruit juice. °Meat and Other Protein Products °Fatty cuts of meat. Ribs, chicken wings, bacon, sausage, bologna, salami, chitterlings, fatback, hot dogs, bratwurst, and packaged luncheon meats. Salted nuts and seeds. Canned beans with salt. °Dairy °Whole or 2% milk, cream, half-and-half, and cream cheese. Whole-fat or sweetened yogurt. Full-fat   cheeses or blue cheese. Nondairy creamers and whipped toppings. Processed cheese, cheese spreads, or cheese  curds. °Condiments °Onion and garlic salt, seasoned salt, table salt, and sea salt. Canned and packaged gravies. Worcestershire sauce. Tartar sauce. Barbecue sauce. Teriyaki sauce. Soy sauce, including reduced sodium. Steak sauce. Fish sauce. Oyster sauce. Cocktail sauce. Horseradish. Ketchup and mustard. Meat flavorings and tenderizers. Bouillon cubes. Hot sauce. Tabasco sauce. Marinades. Taco seasonings. Relishes. °Fats and Oils °Butter, stick margarine, lard, shortening, ghee, and bacon fat. Coconut, palm kernel, or palm oils. Regular salad dressings. °Other °Pickles and olives. Salted popcorn and pretzels. °The items listed above may not be a complete list of foods and beverages to avoid. Contact your dietitian for more information. °WHERE CAN I FIND MORE INFORMATION? °National Heart, Lung, and Blood Institute: www.nhlbi.nih.gov/health/health-topics/topics/dash/ °Document Released: 01/14/2011 Document Revised: 06/11/2013 Document Reviewed: 11/29/2012 °ExitCare® Patient Information ©2015 [employer]. This information is not intended to replace advice given to you by your health care provider. Make sure you discuss any questions you have with your health care provider. ° °

## 2014-04-10 ENCOUNTER — Ambulatory Visit (INDEPENDENT_AMBULATORY_CARE_PROVIDER_SITE_OTHER): Payer: Medicaid Other | Admitting: Family Medicine

## 2014-04-10 VITALS — BP 137/83 | HR 68 | Temp 98.4°F | Wt 235.3 lb

## 2014-04-10 DIAGNOSIS — O0991 Supervision of high risk pregnancy, unspecified, first trimester: Secondary | ICD-10-CM

## 2014-04-10 DIAGNOSIS — O0990 Supervision of high risk pregnancy, unspecified, unspecified trimester: Secondary | ICD-10-CM | POA: Insufficient documentation

## 2014-04-10 DIAGNOSIS — I10 Essential (primary) hypertension: Secondary | ICD-10-CM

## 2014-04-10 NOTE — Assessment & Plan Note (Signed)
Currently pregnant. See pregnancy flow sheet for details - Continue Labetalol 200mg  BID

## 2014-04-10 NOTE — Progress Notes (Signed)
Kendra Harris is a 34 y.o. yo [phone] at 2618w2d who presents for her initial prenatal visit. Chart review shows that given her history of Chronic HTN pre-pregnancy, she meets High Risk Criteria and [facility] (but has not had appointment). Prior pregnancy history: chronic HTN, Gestational DM during each prior pregnancy (diet controlled, did not require insulin or medicines) Delivery history: all full term deliveries without reported complications. Current pregnancy - not planned. Note - Father of baby has Cerebral Palsy  Today she reports having morning sickness for first several weeks, since resolved. Taking PNV daily. Does report some dizziness while taking Labetalol over past 1 month. Denies any CP, SOB, HA, vision changes, syncope or falls. Denies any vaginal bleeding, discharge, fluid leakage. Admits to occasional fetal movement daily. See flow sheet for details.  PMH, POBH, FH, meds, allergies and Social Hx reviewed.  Prenatal exam: Gen: Well-appearing, NAD HEENT: NCAT.  Neck supple without LAD or thyromegaly. Non-tender CV: RRR no murmur, gallops or rubs Lungs: CTA B.  Normal respiratory effort without wheezes or rales. Abd: Obese, soft, NTND. +BS.  Uterus not appreciated above pelvis. Ext: no edema, non-tender, +2 peripheral pulses intact Psych: Normal mood  Assessment/plan: Kendra Harris is a 34 y.o. G4P3003 at 6218w2d by LMP, with Chronic HTN complicating current pregnancy.  - Overall doing well without any concerns - Continue PNV - Taking Labetalol 200mg  BID - Considered High Risk OB patient, [facility] schedule her a new initial prenatal appointment. Discussed with preceptor plan [facility] - Due for prenatal OB labs, pap smear, GC/Chlamydia, varicella titer - Indicated for early glucola test (prior hx GDM with each pregnancy) - Reviewed bleeding and pain precautions,  warning symptoms for pre-eclampsia - Declined influenza vaccine - Discussed genetic screening. Advised to discuss Father history Cerebral Palsy with OB at next visit to see if any testing available  [facility]uled [facility] 04/30/14 at 3:30pm - UPDATE - Notified Dr. Macon LargeAnyanwu, agrees with [facility], to obtain prenatal profile, CMET, urine Pr:Cr, and early 1 hr GTT. Also will need pap smear.

## 2014-04-10 NOTE — Patient Instructions (Signed)
It was good to meet you! Overall it sounds like your pregnancy is going well. The most important thing is to get established with [facility] - Appointment Thursday Continue taking Labetalol as prescribed Drink plenty of fluids and stay well hydrated. Be cautious with activity over next 24 hours if you still feel dizzy.  You will need some more blood work and likely a Pap Smear at the [facility] visit. Also you would be due for the 1 hour early Glucola Test.  Please go directly to [facility] if you develop any significant concerning symptoms with vaginal bleeding, leakage of fluid, early contractions, headache or vision changes not improving with Tylenol.  Prenatal Care  WHAT IS PRENATAL CARE?  Prenatal care means health care during your pregnancy, before your baby is born. It is very important to take care of yourself and your baby during your pregnancy by:   Getting early prenatal care. If you know you are pregnant, or think you might be pregnant, call your health care provider as soon as possible. Schedule a visit for a prenatal exam.  Getting regular prenatal care. Follow your health care provider's schedule for blood and other necessary tests. Do not miss appointments.  Doing everything you can to keep yourself and your baby healthy during your pregnancy.  Getting complete care. Prenatal care should include evaluation of the medical, dietary, educational, psychological, and social needs of you and your significant other. The medical and genetic history of your family and the family of your baby's father should be discussed with your health care provider.  Discussing with your health care provider:  Prescription, over-the-counter, and herbal medicines that you take.  Any history of substance abuse, alcohol use, smoking, and illegal drug use.  Any history of domestic abuse and violence.  Immunizations you have received.  Your nutrition and  diet.  The amount of exercise you do.  Any environmental and occupational hazards to which you are exposed.  History of sexually transmitted infections for both you and your partner.  Previous pregnancies you have had. WHY IS PRENATAL CARE SO IMPORTANT?  By regularly seeing your health care provider, you help ensure that problems can be identified early so that they can be treated as soon as possible. Other problems might be prevented. Many studies have shown that early and regular prenatal care is important for the health of mothers and their babies.  HOW CAN I TAKE CARE OF MYSELF WHILE I AM PREGNANT?  Here are ways to take care of yourself and your baby:   Start or continue taking your multivitamin with 400 micrograms (mcg) of folic acid every day.  Get early and regular prenatal care. It is very important to see a health care provider during your pregnancy. Your health care provider will check at each visit to make sure that you and your baby are healthy. If there are any problems, action can be taken right away to help you and your baby.  Eat a healthy diet that includes:  Fruits.  Vegetables.  Foods low in saturated fat.  Whole grains.  Calcium-rich foods, such as milk, yogurt, and hard cheeses.  Drink 6-8 glasses of liquids a day.  Unless your health care provider tells you not to, try to be physically active for 30 minutes, most days of the week. If you are pressed for time, you can get your activity in through 10-minute segments, three times a day.  Do not smoke, drink  alcohol, or use drugs. These can cause long-term damage to your baby. Talk with your health care provider about steps to take to stop smoking. Talk with a member of your faith community, a counselor, a trusted friend, or your health care provider if you are concerned about your alcohol or drug use.  Ask your health care provider before taking any medicine, even over-the-counter medicines. Some medicines are  not safe to take during pregnancy.  Get plenty of rest and sleep.  Avoid hot tubs and saunas during pregnancy.  Do not have X-rays taken unless absolutely necessary and with the recommendation of your health care provider. A lead shield can be placed on your abdomen to protect your baby when X-rays are taken in other parts of your body.  Do not empty the cat litter when you are pregnant. It may contain a parasite that causes an infection called toxoplasmosis, which can cause birth defects. Also, use gloves when working in garden areas used by cats.  Do not eat uncooked or undercooked meats or fish.  Do not eat soft, mold-ripened cheeses (Brie, Camembert, and chevre) or soft, blue-veined cheese (Danish blue and Roquefort).  Stay away from toxic chemicals like:  Insecticides.  Solvents (some cleaners or paint thinners).  Lead.  Mercury.  Sexual intercourse may continue until the end of the pregnancy, unless you have a medical problem or there is a problem with the pregnancy and your health care provider tells you not to.  Do not wear high-heel shoes, especially during the second half of the pregnancy. You can lose your balance and fall.  Do not take long trips, unless absolutely necessary. Be sure to see your health care provider before going on the trip.  Do not sit in one position for more than 2 hours when on a trip.  Take a copy of your medical records when going on a trip. Know where a hospital is located in the city you are visiting, in case of an emergency.  Most dangerous household products will have pregnancy warnings on their labels. Ask your health care provider about products if you are unsure.  Limit or eliminate your caffeine intake from coffee, tea, sodas, medicines, and chocolate.  Many women continue working through pregnancy. Staying active might help you stay healthier. If you have a question about the safety or the hours you work at your particular job, talk  with your health care provider.  Get informed:  Read books.  Watch videos.  Go to childbirth classes for you and your significant other.  Talk with experienced moms.  Ask your health care provider about childbirth education classes for you and your partner. Classes can help you and your partner prepare for the birth of your baby.  Ask about a baby doctor (pediatrician) and methods and pain medicine for labor, delivery, and possible cesarean delivery. HOW OFTEN SHOULD I SEE MY HEALTH CARE PROVIDER DURING PREGNANCY?  Your health care provider will give you a schedule for your prenatal visits. You will have visits more often as you get closer to the end of your pregnancy. An average pregnancy lasts about 40 weeks.  A typical schedule includes visiting your health care provider:   About once each month during your first 6 months of pregnancy.  Every 2 weeks during the next 2 months.  Weekly in the last month, until the delivery date. Your health care provider will probably want to see you more often if:  You are older than 35 years.  Your pregnancy is high risk because you have certain health problems or problems with the pregnancy, such as:  Diabetes.  High blood pressure.  The baby is not growing on schedule, according to the dates of the pregnancy. Your health care provider will do special tests to make sure you and your baby are not having any serious problems. WHAT HAPPENS DURING PRENATAL VISITS?   At your first prenatal visit, your health care provider will do a physical exam and talk to you about your health history and the health history of your partner and your family. Your health care provider will be able to tell you what date to expect your baby to be born on.  Your first physical exam will include checks of your blood pressure, measurements of your height and weight, and an exam of your pelvic organs. Your health care provider will do a Pap test if you have not had  one recently and will do cultures of your cervix to make sure there is no infection.  At each prenatal visit, there will be tests of your blood, urine, blood pressure, weight, and the progress of the baby will be checked.  At your later prenatal visits, your health care provider will check how you are doing and how your baby is developing. You may have a number of tests done as your pregnancy progresses.  Ultrasound exams are often used to check on your baby's growth and health.  You may have more urine and blood tests, as well as special tests, if needed. These may include amniocentesis to examine fluid in the pregnancy sac, stress tests to check how the baby responds to contractions, or a biophysical profile to measure your baby's well-being. Your health care provider will explain the tests and why they are necessary.  You should be tested for high blood sugar (gestational diabetes) between the 24th and 28th weeks of your pregnancy.  You should discuss with your health care provider your plans to breastfeed or bottle-feed your baby.  Each visit is also a chance for you to learn about staying healthy during pregnancy and to ask questions. Document Released: 01/28/2003 Document Revised: 01/30/2013 Document Reviewed: 04/11/2013 [facility] Patient Information 2015 [place], [place]. This information is not intended to replace advice given to you by your health care provider. Make sure you discuss any questions you have with your health care provider.

## 2014-04-10 NOTE — Assessment & Plan Note (Signed)
See Vitals and Notes for details.

## 2014-04-12 ENCOUNTER — Encounter: Payer: Medicaid Other | Admitting: Family Medicine

## 2014-04-16 ENCOUNTER — Telehealth: Payer: Self-pay | Admitting: Family Medicine

## 2014-04-16 NOTE — Telephone Encounter (Signed)
Called patient, reviewed information from last OV 3/2. Spoke with Kendra Harris, advised her that Dr. Macon LargeAnyanwu ([facility] has reviewed her chart and agrees with plan for next follow-up on 04/30/14 at 3:30pm, as previously discussed. She is to continue Labetalol as prescribed. No other changes at this time. Labs to be obtained at that visit including initial prenatal profile, CMET, urine Pr:Cr, and early 1 hr GTT, pap smear. See last OV note for more details.  Patient understood, and plans [facility]. Advised her to call them with questions or concerns between now and that apt, and if any emergent or concerning symptoms develop, she may present to [facility] for more immediate pregnancy related medical attention.  Kendra PilarAlexander [name], DO [facility], PGY-2

## 2014-04-16 NOTE — Telephone Encounter (Signed)
NA

## 2014-04-16 NOTE — Telephone Encounter (Signed)
Pt understood dr was going to try to get her in at high risk clinic before march 22

## 2014-04-30 ENCOUNTER — Encounter: Payer: Medicaid Other | Admitting: Obstetrics & Gynecology

## 2014-05-02 ENCOUNTER — Encounter: Payer: Medicaid Other | Admitting: Obstetrics & Gynecology

## 2014-05-23 ENCOUNTER — Other Ambulatory Visit (HOSPITAL_COMMUNITY)
Admission: RE | Admit: 2014-05-23 | Discharge: 2014-05-23 | Disposition: A | Discharge status: Home / Self Care | Payer: Medicaid Other | Source: Ambulatory Visit | Attending: Family Medicine | Admitting: Family Medicine

## 2014-05-23 ENCOUNTER — Encounter: Payer: Self-pay | Admitting: Family Medicine

## 2014-05-23 ENCOUNTER — Ambulatory Visit (INDEPENDENT_AMBULATORY_CARE_PROVIDER_SITE_OTHER): Payer: Medicaid Other | Admitting: Family Medicine

## 2014-05-23 VITALS — BP 115/58 | HR 71 | Temp 98.4°F | Wt 232.7 lb

## 2014-05-23 DIAGNOSIS — Z113 Encounter for screening for infections with a predominantly sexual mode of transmission: Secondary | ICD-10-CM | POA: Insufficient documentation

## 2014-05-23 DIAGNOSIS — Z1151 Encounter for screening for human papillomavirus (HPV): Secondary | ICD-10-CM | POA: Insufficient documentation

## 2014-05-23 DIAGNOSIS — Z01419 Encounter for gynecological examination (general) (routine) without abnormal findings: Secondary | ICD-10-CM | POA: Insufficient documentation

## 2014-05-23 DIAGNOSIS — O10919 Unspecified pre-existing hypertension complicating pregnancy, unspecified trimester: Secondary | ICD-10-CM | POA: Insufficient documentation

## 2014-05-23 DIAGNOSIS — O0991 Supervision of high risk pregnancy, unspecified, first trimester: Secondary | ICD-10-CM

## 2014-05-23 DIAGNOSIS — O09529 Supervision of elderly multigravida, unspecified trimester: Secondary | ICD-10-CM | POA: Insufficient documentation

## 2014-05-23 DIAGNOSIS — O0992 Supervision of high risk pregnancy, unspecified, second trimester: Secondary | ICD-10-CM

## 2014-05-23 DIAGNOSIS — O10912 Unspecified pre-existing hypertension complicating pregnancy, second trimester: Secondary | ICD-10-CM

## 2014-05-23 DIAGNOSIS — O09522 Supervision of elderly multigravida, second trimester: Secondary | ICD-10-CM

## 2014-05-23 LAB — POCT URINALYSIS DIP (DEVICE)
Bilirubin Urine: NEGATIVE
Glucose, UA: NEGATIVE mg/dL
Hgb urine dipstick: NEGATIVE
Ketones, ur: NEGATIVE mg/dL
NITRITE: NEGATIVE
PROTEIN: NEGATIVE mg/dL
Specific Gravity, Urine: >=1.03 (ref 1.005–1.030)
UROBILINOGEN UA: 0.2 mg/dL (ref 0.0–1.0)
pH: 5.5 (ref 5.0–8.0)

## 2014-05-23 MED ORDER — ASPIRIN EC 81 MG PO TBEC: 81.0000 mg | DELAYED_RELEASE_TABLET | Freq: Every day | ORAL | Status: DC

## 2014-05-23 NOTE — Progress Notes (Signed)
Anatomy U/S 06/03/14 @ 1p with Radiology.

## 2014-05-23 NOTE — Progress Notes (Signed)
Initial visit. New OB labs today with pap; early 1hr gtt as well. Will schedule anatomy scan.  New OB packet given.

## 2014-05-23 NOTE — Progress Notes (Signed)
   Subjective:    Kendra Harris is a [id number] 6256w3d being seen today for her first obstetrical visit.  Her obstetrical history is significant for advanced maternal age and chronic hypertension and h/o gestatational diabetes. Patient does not intend to breast feed. Pregnancy history fully reviewed.  Patient reports no complaints.  Filed Vitals:   05/23/14 0936  BP: 115/58  Pulse: 71  Temp: 98.4 F (36.9 C)  Weight: 232 lb 11.2 oz (105.552 kg)    HISTORY: OB History  Gravida Para Term Preterm AB SAB TAB Ectopic Multiple Living  4 3 3  0 0 0 0 0 0 3    # Outcome Date GA Lbr Len/2nd Weight Sex Delivery Anes PTL Lv  4 Current           3 Term 05/06/09    F Vag-Spont  N Y  2 Term 09/09/05 5760w0d  6 lb 8 oz (2.948 kg) F Vag-Spont   Y  1 Term 09/11/04 6360w0d  6 lb 8 oz (2.948 kg) M Vag-Spont  N Y     Past Medical History  Diagnosis Date  . DELAYED MENSES 02/11/2010    Qualifier: Diagnosis of  By: Wallene Huharew  MD, Rande LawmanKhary    . Urinary tract infection 03/04/2011  . Hypertension    Past Surgical History  Procedure Laterality Date  . No past surgeries     History reviewed. No pertinent family history.   Exam    Uterus:  Fundal Height: 18 cm  Pelvic Exam:    Perineum: No Hemorrhoids, Normal Perineum   Vulva: normal   Vagina:  normal mucosa, normal discharge   Cervix: multiparous appearance   Adnexa: not evaluated   Bony Pelvis: gynecoid  System:     Skin: normal coloration and turgor, no rashes    Neurologic: oriented, normal   Extremities: normal strength, tone, and muscle mass   HEENT PERRLA and extra ocular movement intact       Neck supple and no masses   Cardiovascular: regular rate and rhythm, no murmurs or gallops   Respiratory:  appears well, vitals normal, no respiratory distress, acyanotic, normal RR, ear and throat exam is normal, neck free of mass or lymphadenopathy, chest clear, no wheezing, crepitations, rhonchi, normal symmetric air entry   Abdomen: soft,  non-tender; bowel sounds normal; no masses,  no organomegaly   Urinary: urethral meatus normal      Assessment:    Pregnancy: [id number] Patient Active Problem List   Diagnosis Date Noted  . Supervision of high-risk pregnancy 04/10/2014    Priority: High  . DIABETES MELLITUS, GESTATIONAL, HX OF 02/13/2009    Priority: Medium  . Essential hypertension, benign 08/31/2007    Priority: Medium  . Morbid obesity with BMI of 45.0-49.9, adult 04/07/2006    Priority: Medium  . Chronic hypertension in pregnancy 05/23/2014  . Lymphadenopathy of left cervical region 06/29/2013  . Menorrhagia 04/27/2013  . High risk sexual behavior 11/25/2010  . Severe major depression with psychotic features 07/24/2010  . MIGRAINE HEADACHE 12/13/2007  . GERD 11/09/2007        Plan:     Initial labs drawn. Prenatal vitamins. Problem list reviewed and updated. Genetic Screening discussed Quad Screen: ordered.  Ultrasound discussed; fetal survey: ordered.  Follow up in 4 weeks. 100% of 30 min visit spent on counseling and coordination of care.  24hr Urine with CMP ASA 81mg  Discussed US q4 weeks and [facility]   Candelaria CelesteSTINSON, Montrae Braithwaite JEHIEL 05/23/2014

## 2014-05-24 LAB — PRENATAL PROFILE (SOLSTAS)
Antibody Screen: NEGATIVE
BASOS ABS: 0 10*3/uL (ref 0.0–0.1)
Basophils Relative: 0 % (ref 0–1)
EOS PCT: 1 % (ref 0–5)
Eosinophils Absolute: 0.1 10*3/uL (ref 0.0–0.7)
HEMATOCRIT: 34.3 % — AB (ref 36.0–46.0)
HIV 1&2 Ab, 4th Generation: NONREACTIVE
Hemoglobin: 11.4 g/dL — ABNORMAL LOW (ref 12.0–15.0)
Hepatitis B Surface Ag: NEGATIVE
Lymphocytes Relative: 10 % — ABNORMAL LOW (ref 12–46)
Lymphs Abs: 1 10*3/uL (ref 0.7–4.0)
MCH: 28.1 pg (ref 26.0–34.0)
MCHC: 33.2 g/dL (ref 30.0–36.0)
MCV: 84.5 fL (ref 78.0–100.0)
MONO ABS: 0.5 10*3/uL (ref 0.1–1.0)
MONOS PCT: 5 % (ref 3–12)
MPV: 11.2 fL (ref 8.6–12.4)
Neutro Abs: 8.7 10*3/uL — ABNORMAL HIGH (ref 1.7–7.7)
Neutrophils Relative %: 84 % — ABNORMAL HIGH (ref 43–77)
PLATELETS: 277 10*3/uL (ref 150–400)
RBC: 4.06 MIL/uL (ref 3.87–5.11)
RDW: 13.9 % (ref 11.5–15.5)
RH TYPE: POSITIVE
RUBELLA: 3.36 {index} — AB (ref <0.90)
WBC: 10.4 10*3/uL (ref 4.0–10.5)

## 2014-05-24 LAB — CULTURE, OB URINE: Colony Count: 50000

## 2014-05-24 LAB — GLUCOSE TOLERANCE, 1 HOUR (50G) W/O FASTING: Glucose, 1 Hour GTT: 159 mg/dL — ABNORMAL HIGH (ref 70–140)

## 2014-05-24 LAB — CYTOLOGY - PAP

## 2014-05-28 LAB — PRESCRIPTION MONITORING PROFILE (19 PANEL)
AMPHETAMINE/METH: NEGATIVE ng/mL
BARBITURATE SCREEN, URINE: NEGATIVE ng/mL
BENZODIAZEPINE SCREEN, URINE: NEGATIVE ng/mL
BUPRENORPHINE, URINE: NEGATIVE ng/mL
CANNABINOID SCRN UR: NEGATIVE ng/mL
CARISOPRODOL, URINE: NEGATIVE ng/mL
Cocaine Metabolites: NEGATIVE ng/mL
Creatinine, Urine: 188.13 mg/dL (ref >20.0)
MDMA URINE: NEGATIVE ng/mL
METHAQUALONE SCREEN (URINE): NEGATIVE ng/mL
Meperidine, Ur: NEGATIVE ng/mL
Methadone Screen, Urine: NEGATIVE ng/mL
NITRITES URINE, INITIAL: NEGATIVE ug/mL
OPIATE SCREEN, URINE: NEGATIVE ng/mL
Oxycodone Screen, Ur: NEGATIVE ng/mL
PROPOXYPHENE: NEGATIVE ng/mL
Phencyclidine, Ur: NEGATIVE ng/mL
Tapentadol, urine: NEGATIVE ng/mL
Tramadol Scrn, Ur: NEGATIVE ng/mL
ZOLPIDEM, URINE: NEGATIVE ng/mL
pH, Initial: 5.8 pH (ref 4.5–8.9)

## 2014-05-28 LAB — AFP, QUAD SCREEN
AFP: 50.4 ng/mL
Age Alone: 1:293 {titer}
CURR GEST AGE: 18.3 wks.days
HCG, Total: 7.39 IU/mL
INH: 121.8 pg/mL
Interpretation-AFP: NEGATIVE
MoM for AFP: 1.3
MoM for INH: 0.89
MoM for hCG: 0.36
Open Spina bifida: NEGATIVE
Osb Risk: 1:9920 {titer}
Tri 18 Scr Risk Est: NEGATIVE
uE3 Mom: 1.13
uE3 Value: 1.42 ng/mL

## 2014-05-28 LAB — FENTANYL (GC/LC/MS), URINE
Fentanyl, confirm: NEGATIVE ng/mL (ref <0.5)
Norfentanyl, confirm: NEGATIVE ng/mL (ref <0.5)

## 2014-05-29 ENCOUNTER — Telehealth: Payer: Self-pay | Admitting: *Deleted

## 2014-05-29 NOTE — Telephone Encounter (Signed)
Pt contacted the clinic and states she has a 24hour container and is in the process of obtaining urine.

## 2014-05-29 NOTE — Telephone Encounter (Signed)
-----   Message from Vivien Rotaheryl A Clinton sent at 05/29/2014 11:02 AM EDT ----- Patient has an appointment. Please call with details. Thank you   ----- Message -----    From: Levie HeritageJacob J Stinson, DO    Sent: 05/24/2014   8:13 PM      To: [facility] Admin Pool  Elevated 1 hr GTT.  Needs to be scheduled for 3hr GTT

## 2014-05-29 NOTE — Telephone Encounter (Signed)
Contacted patient, results of elevated 1 hour gtt given.  Pt can come 4/21 @ 0830 for 3 hour gtt.  Instructions given.  Pt verbalizes understanding.

## 2014-05-29 NOTE — Telephone Encounter (Signed)
Attempted to contact patient to request she come in for 24 hour urine, no answer, left message for patient to contact the clinic.

## 2014-05-29 NOTE — Telephone Encounter (Signed)
-----   Message from Levie HeritageJacob J Stinson, DO sent at 05/28/2014  6:28 PM EDT ----- Needs a reminder to do 24hr Urine Thanks.  Gerilyn PilgrimJacob  ----- Message -----    From: SYSTEM    Sent: 05/28/2014  12:04 AM      To: Levie HeritageJacob J Stinson, DO

## 2014-05-30 ENCOUNTER — Other Ambulatory Visit: Payer: Medicaid Other

## 2014-05-30 ENCOUNTER — Encounter: Payer: Self-pay | Admitting: Obstetrics & Gynecology

## 2014-05-30 DIAGNOSIS — O99320 Drug use complicating pregnancy, unspecified trimester: Secondary | ICD-10-CM | POA: Insufficient documentation

## 2014-05-31 ENCOUNTER — Other Ambulatory Visit: Payer: Medicaid Other

## 2014-06-03 ENCOUNTER — Other Ambulatory Visit: Payer: Self-pay | Admitting: Family Medicine

## 2014-06-03 ENCOUNTER — Ambulatory Visit (HOSPITAL_COMMUNITY)
Admission: RE | Admit: 2014-06-03 | Discharge: 2014-06-03 | Disposition: A | Discharge status: Home / Self Care | Payer: Medicaid Other | Source: Ambulatory Visit | Attending: Family Medicine | Admitting: Family Medicine

## 2014-06-03 DIAGNOSIS — O09522 Supervision of elderly multigravida, second trimester: Secondary | ICD-10-CM

## 2014-06-03 DIAGNOSIS — Z36 Encounter for antenatal screening of mother: Secondary | ICD-10-CM | POA: Insufficient documentation

## 2014-06-03 DIAGNOSIS — O10012 Pre-existing essential hypertension complicating pregnancy, second trimester: Secondary | ICD-10-CM | POA: Diagnosis not present

## 2014-06-03 DIAGNOSIS — F1910 Other psychoactive substance abuse, uncomplicated: Secondary | ICD-10-CM

## 2014-06-03 DIAGNOSIS — O0991 Supervision of high risk pregnancy, unspecified, first trimester: Secondary | ICD-10-CM

## 2014-06-03 DIAGNOSIS — O10912 Unspecified pre-existing hypertension complicating pregnancy, second trimester: Secondary | ICD-10-CM

## 2014-06-03 DIAGNOSIS — Z3A23 23 weeks gestation of pregnancy: Secondary | ICD-10-CM | POA: Diagnosis not present

## 2014-06-03 DIAGNOSIS — O99320 Drug use complicating pregnancy, unspecified trimester: Secondary | ICD-10-CM

## 2014-06-03 DIAGNOSIS — Z3A20 20 weeks gestation of pregnancy: Secondary | ICD-10-CM | POA: Insufficient documentation

## 2014-06-16 ENCOUNTER — Emergency Department (INDEPENDENT_AMBULATORY_CARE_PROVIDER_SITE_OTHER)
Admission: EM | Admit: 2014-06-16 | Discharge: 2014-06-16 | Disposition: A | Discharge status: Home / Self Care | Payer: Medicaid Other | Source: Home / Self Care | Attending: Family Medicine | Admitting: Family Medicine

## 2014-06-16 ENCOUNTER — Encounter (HOSPITAL_COMMUNITY): Payer: Self-pay | Admitting: Emergency Medicine

## 2014-06-16 DIAGNOSIS — K047 Periapical abscess without sinus: Secondary | ICD-10-CM

## 2014-06-16 DIAGNOSIS — K0889 Other specified disorders of teeth and supporting structures: Secondary | ICD-10-CM

## 2014-06-16 DIAGNOSIS — K088 Other specified disorders of teeth and supporting structures: Secondary | ICD-10-CM

## 2014-06-16 MED ORDER — HYDROCODONE-ACETAMINOPHEN 5-325 MG PO TABS: 1.0000 | ORAL_TABLET | ORAL | Status: DC | PRN

## 2014-06-16 MED ORDER — AMOXICILLIN 500 MG PO CAPS: 1000.0000 mg | ORAL_CAPSULE | Freq: Two times a day (BID) | ORAL | Status: DC

## 2014-06-16 NOTE — ED Notes (Signed)
Pt triaged and assessed by provider.   Provider in before nurse. 

## 2014-06-16 NOTE — ED Provider Notes (Signed)
CSN: [id number]     Arrival date & time 06/16/14  1855 History   First MD Initiated Contact with Patient 06/16/14 1912     Chief Complaint  Patient presents with  . Dental Pain   (Consider location/radiation/quality/duration/timing/severity/associated sxs/prior Treatment) HPI Comments: 34 year old female complaining of a toothache with swelling side of the 2 since last PM. She has had problems with this tooth in the remote past. 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 St 7 mo pregnant.   Past Medical History  Diagnosis Date  . DELAYED MENSES 02/11/2010    Qualifier: Diagnosis of  By: Wallene Huharew  MD, Rande LawmanKhary    . Urinary tract infection 03/04/2011  . Hypertension    Past Surgical History  Procedure Laterality Date  . No past surgeries     History reviewed. No pertinent family history. History  Substance Use Topics  . Smoking status: Never Smoker   . Smokeless tobacco: Never Used  . Alcohol Use: No   OB History    Gravida Para Term Preterm AB TAB SAB Ectopic Multiple Living   4 3 3  0 0 0 0 0 0 3     Review of Systems  Constitutional: Negative for fever and activity change.  HENT: Positive for dental problem. Negative for facial swelling, sinus pressure and sore throat.   Respiratory: Negative.   All other systems reviewed and are negative.   Allergies  Review of patient's allergies indicates no known allergies.  Home Medications   Prior to Admission medications   Medication Sig Start Date End Date Taking? Authorizing Provider  labetalol (NORMODYNE) 200 MG tablet Take 1 tablet (200 mg total) by mouth 2 (two) times daily. 02/18/14  Yes Reva Boresanya S Pratt, MD  amoxicillin (AMOXIL) 500 MG capsule Take 2 capsules (1,000 mg total) by mouth 2 (two) times daily. 06/16/14   Hayden Rasmussenavid Kairy Folsom, NP  aspirin EC 81 MG tablet Take 1 tablet (81 mg total) by mouth daily. 05/23/14   Levie HeritageJacob J Stinson, DO  HYDROcodone-acetaminophen (NORCO/VICODIN) 5-325 MG per tablet Take 1 tablet by mouth every 4 (four) hours as needed. 06/16/14    Hayden Rasmussenavid Curtistine Pettitt, NP  Prenat w/o A Vit-FeFum-FePo-FA (CONCEPT OB) 130-92.4-1 MG CAPS Take 1 capsule by mouth daily. 02/18/14   Reva Boresanya S Pratt, MD   BP 149/94 mmHg  Pulse 86  Temp(Src) 98.7 F (37.1 C) (Oral)  Resp 20  SpO2 98%  LMP 01/14/2014 Physical Exam  Constitutional: She appears well-developed and well-nourished. No distress.  HENT:  Mouth/Throat: Oropharynx is clear and moist.  Tenderness to the right lower second molar with surrounding gingival swelling, erythema and tenderness.  Neck: Normal range of motion. Neck supple.  Nursing note and vitals reviewed.   ED Course  Procedures (including critical care time) Labs Review Labs Reviewed - No data to display  Imaging Review No results found.   MDM   1. Toothache   2. Dental infection    Norco 5 mg #15 Amoxil as dir Call dentist tomorrow    Hayden RasmussenDavid Amier Hoyt, NP 06/16/14 1927  Hayden Rasmussenavid Hatsue Sime, NP 06/16/14 1928

## 2014-06-16 NOTE — Discharge Instructions (Signed)
Dental Pain A tooth ache may be caused by cavities (tooth decay). Cavities expose the nerve of the tooth to air and hot or cold temperatures. It may come from an infection or abscess (also called a boil or furuncle) around your tooth. It is also often caused by dental caries (tooth decay). This causes the pain you are having. DIAGNOSIS  Your caregiver can diagnose this problem by exam. TREATMENT   If caused by an infection, it may be treated with medications which kill germs (antibiotics) and pain medications as prescribed by your caregiver. Take medications as directed.  Only take over-the-counter or prescription medicines for pain, discomfort, or fever as directed by your caregiver.  Whether the tooth ache today is caused by infection or dental disease, you should see your dentist as soon as possible for further care. SEEK MEDICAL CARE IF: The exam and treatment you received today has been provided on an emergency basis only. This is not a substitute for complete medical or dental care. If your problem worsens or new problems (symptoms) appear, and you are unable to meet with your dentist, call or return to this location. SEEK IMMEDIATE MEDICAL CARE IF:   You have a fever.  You develop redness and swelling of your face, jaw, or neck.  You are unable to open your mouth.  You have severe pain uncontrolled by pain medicine. MAKE SURE YOU:   Understand these instructions.  Will watch your condition.  Will get help right away if you are not doing well or get worse. Document Released: 01/25/2005 Document Revised: 04/19/2011 Document Reviewed: 09/13/2007 [facility] Patient Information 2015 [place], [place]. This information is not intended to replace advice given to you by your health care provider. Make sure you discuss any questions you have with your health care provider.  Abscessed Tooth An abscessed tooth is an infection around your tooth. It may be caused by holes or damage to the  tooth (cavity) or a dental disease. An abscessed tooth causes mild to very bad pain in and around the tooth. See your dentist right away if you have tooth or gum pain. HOME CARE  Take your medicine as told. Finish it even if you start to feel better.  Do not drive after taking pain medicine.  Rinse your mouth (gargle) often with salt water ( teaspoon salt in 8 ounces of warm water).  Do not apply heat to the outside of your face. GET HELP RIGHT AWAY IF:   You have a temperature by mouth above 102 F (38.9 C), not controlled by medicine.  You have chills and a very bad headache.  You have problems breathing or swallowing.  Your mouth will not open.  You develop puffiness (swelling) on the neck or around the eye.  Your pain is not helped by medicine.  Your pain is getting worse instead of better. MAKE SURE YOU:   Understand these instructions.  Will watch your condition.  Will get help right away if you are not doing well or get worse. Document Released: 07/14/2007 Document Revised: 04/19/2011 Document Reviewed: 05/05/2010 [facility] Patient Information 2015 [place]Care, [place]LLC. This information is not intended to replace advice given to you by your health care provider. Make sure you discuss any questions you have with your health care provider.

## 2014-06-19 ENCOUNTER — Encounter (HOSPITAL_COMMUNITY): Payer: Self-pay | Admitting: *Deleted

## 2014-06-19 ENCOUNTER — Inpatient Hospital Stay (HOSPITAL_COMMUNITY)
Admission: AD | Admit: 2014-06-19 | Discharge: 2014-06-19 | Disposition: A | Discharge status: Home / Self Care | Payer: Medicaid Other | Source: Ambulatory Visit | Attending: Obstetrics & Gynecology | Admitting: Obstetrics & Gynecology

## 2014-06-19 DIAGNOSIS — K088 Other specified disorders of teeth and supporting structures: Secondary | ICD-10-CM | POA: Diagnosis present

## 2014-06-19 DIAGNOSIS — Z3A25 25 weeks gestation of pregnancy: Secondary | ICD-10-CM | POA: Diagnosis not present

## 2014-06-19 DIAGNOSIS — O9989 Other specified diseases and conditions complicating pregnancy, childbirth and the puerperium: Secondary | ICD-10-CM | POA: Diagnosis not present

## 2014-06-19 DIAGNOSIS — K0889 Other specified disorders of teeth and supporting structures: Secondary | ICD-10-CM

## 2014-06-19 DIAGNOSIS — S025XXA Fracture of tooth (traumatic), initial encounter for closed fracture: Secondary | ICD-10-CM | POA: Insufficient documentation

## 2014-06-19 MED ORDER — IBUPROFEN 600 MG PO TABS: 600.0000 mg | ORAL_TABLET | Freq: Two times a day (BID) | ORAL | Status: DC

## 2014-06-19 MED ORDER — IBUPROFEN 600 MG PO TABS
600.0000 mg | ORAL_TABLET | Freq: Once | ORAL | Status: AC
  Administered 2014-06-19: 600 mg via ORAL
  Filled 2014-06-19: qty 1

## 2014-06-19 NOTE — MAU Provider Note (Signed)
History     CSN: [id number]  Arrival date and time: 06/19/14 1516   First Provider Initiated Contact with Patient 06/19/14 1648      Chief Complaint  Patient presents with  . Dental Pain   HPI Patient is 34 y.o. [id number] 1437w5d here with complaints of toothache.  Patient reports that 3 days ago she began having severe pain in her bottom back tooth.  Pain is worse at night.  She has been taking Tylenol without relief.  She reports last dose as 2pm today.  [facility] and did not mention the Norco prescribed.  When asked about this she reports that it was not useful and that she "flushed it down the toilet".  She also notes that she took only 1 pill of abx and stopped taking them.  She denies difficulty eating or drinking.  No nausea, vomiting, fevers, chills, facial swelling.  +FM, denies LOF, VB, contractions, vaginal discharge.  OB History    Gravida Para Term Preterm AB TAB SAB Ectopic Multiple Living   4 3 3  0 0 0 0 0 0 3      Past Medical History  Diagnosis Date  . DELAYED MENSES 02/11/2010    Qualifier: Diagnosis of  By: Wallene Huharew  MD, Rande LawmanKhary    . Urinary tract infection 03/04/2011  . Hypertension     Past Surgical History  Procedure Laterality Date  . No past surgeries      History reviewed. No pertinent family history.  History  Substance Use Topics  . Smoking status: Never Smoker   . Smokeless tobacco: Never Used  . Alcohol Use: No    Allergies: No Known Allergies  Prescriptions prior to admission  Medication Sig Dispense Refill Last Dose  . acetaminophen (TYLENOL) 500 MG tablet Take 500 mg by mouth every 6 (six) hours as needed for mild pain or headache.   06/18/2014 at Unknown time  . amoxicillin (AMOXIL) 500 MG capsule Take 2 capsules (1,000 mg total) by mouth 2 (two) times daily. 30 capsule 0 06/19/2014 at Unknown time  . aspirin EC 81 MG tablet Take 1 tablet (81 mg total) by mouth daily.   Past Month at Unknown  time  . labetalol (NORMODYNE) 200 MG tablet Take 1 tablet (200 mg total) by mouth 2 (two) times daily. 60 tablet 3 06/19/2014 at 0800  . Prenat w/o A Vit-FeFum-FePo-FA (CONCEPT OB) 130-92.4-1 MG CAPS Take 1 capsule by mouth daily. 30 capsule 3 06/19/2014 at Unknown time  . HYDROcodone-acetaminophen (NORCO/VICODIN) 5-325 MG per tablet Take 1 tablet by mouth every 4 (four) hours as needed. (Patient not taking: Reported on 06/19/2014) 15 tablet 0 Not Taking at Unknown time    Review of Systems  Constitutional: Negative for fever and chills.  HENT: Negative for sore throat.        +tooth pain  Eyes: Negative for blurred vision and double vision.  Respiratory: Negative for cough, sputum production and shortness of breath.   Cardiovascular: Negative for chest pain and palpitations.  Gastrointestinal: Negative for heartburn, nausea, vomiting, abdominal pain and diarrhea.  Genitourinary:       No LOF, vaginal discharge,  Vaginal bleeding; +FM  Musculoskeletal: Negative for falls.  Neurological: Positive for loss of consciousness. Negative for dizziness, weakness and headaches.   Physical Exam   Blood pressure 136/77, pulse 75, temperature 98.1 F (36.7 C), temperature source Oral, resp. rate 20, height 5\' 4"  (1.626 m), weight  236 lb (107.049 kg), last menstrual period 01/14/2014.  Physical Exam  Constitutional: She is oriented to person, place, and time. She appears well-developed and well-nourished. No distress.  HENT:  Mouth/Throat: Oropharynx is clear and moist.  +poor dentition throughout, R molar on bottom broken with evidence of decay, +TTP, mild swelling, no purulence appreciated, no facial swelling or evidence of tooth abscess   Neck: Normal range of motion. Neck supple.  Cardiovascular: Normal rate and intact distal pulses.   Respiratory: Effort normal. No stridor. No respiratory distress.  GI: Soft.  gravid  Musculoskeletal: Normal range of motion. She exhibits no edema.   Neurological: She is alert and oriented to person, place, and time.  Skin: Skin is warm and dry.  Psychiatric: She has a normal mood and affect. Her behavior is normal.   Fetal monitoringBaseline: 135 bpm and Variability: Good {> 6 bpm)  Uterine activityNone  MAU Course  Procedures  MDM NST reassuring/appropriate for GA Motrin 600mg  PO x1 here in MAU  Assessment and Plan  Broken tooth/toothache -Letter for dentist given -Motrin 600mg  BID x2 days rx'd -patient instructed to resume Amoxicillin -Patient tolerating PO well, return precautions reviewed -patient to follow up with OB as scheduled  Raliegh IpGottschalk, Martin Belling M, DO 06/19/2014, 5:09 PM

## 2014-06-19 NOTE — Discharge Instructions (Signed)
Dental Pain  Toothache is pain in or around a tooth. It may get worse with chewing or with cold or heat.   HOME CARE  · Your dentist may use a numbing medicine during treatment. If so, you may need to avoid eating until the medicine wears off. Ask your dentist about this.  · Only take medicine as told by your dentist or doctor.  · Avoid chewing food near the painful tooth until after all treatment is done. Ask your dentist about this.  GET HELP RIGHT AWAY IF:   · The problem gets worse or new problems appear.  · You have a fever.  · There is redness and puffiness (swelling) of the face, jaw, or neck.  · You cannot open your mouth.  · There is pain in the jaw.  · There is very bad pain that is not helped by medicine.  MAKE SURE YOU:   · Understand these instructions.  · Will watch your condition.  · Will get help right away if you are not doing well or get worse.  Document Released: 07/14/2007 Document Revised: 04/19/2011 Document Reviewed: 07/14/2007  ExitCare® Patient Information ©2015 ExitCare, LLC. This information is not intended to replace advice given to you by your health care provider. Make sure you discuss any questions you have with your health care provider.

## 2014-06-19 NOTE — MAU Note (Signed)
Pain in rt lower jaw. Went to Urgent care last week.  Was given rx for pain and antibiotics;  The drugstore told her to be careful because the pain med may make the baby sleepy, so she did not take it.  Came here to see if it was ok to take, did not call [facility].  Just wanted to be sure if it was ok.. Encouraged pt to call dentist and OB

## 2014-06-20 ENCOUNTER — Encounter: Payer: Medicaid Other | Admitting: Family

## 2014-07-18 ENCOUNTER — Ambulatory Visit (INDEPENDENT_AMBULATORY_CARE_PROVIDER_SITE_OTHER): Payer: Medicaid Other | Admitting: Family

## 2014-07-18 VITALS — BP 122/67 | HR 70 | Temp 98.7°F | Wt 238.0 lb

## 2014-07-18 DIAGNOSIS — O163 Unspecified maternal hypertension, third trimester: Secondary | ICD-10-CM

## 2014-07-18 DIAGNOSIS — O10912 Unspecified pre-existing hypertension complicating pregnancy, second trimester: Secondary | ICD-10-CM

## 2014-07-18 DIAGNOSIS — O10913 Unspecified pre-existing hypertension complicating pregnancy, third trimester: Secondary | ICD-10-CM | POA: Diagnosis not present

## 2014-07-18 DIAGNOSIS — O0993 Supervision of high risk pregnancy, unspecified, third trimester: Secondary | ICD-10-CM

## 2014-07-18 DIAGNOSIS — O0991 Supervision of high risk pregnancy, unspecified, first trimester: Secondary | ICD-10-CM
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
## 2014-07-18 NOTE — Progress Notes (Signed)
Reviewed tip of week with patient Discussed scheduled 3 hr gtt  U/s scheduled for 6/15

## 2014-07-19 LAB — POCT URINALYSIS DIP (DEVICE)
Bilirubin Urine: NEGATIVE
Glucose, UA: NEGATIVE mg/dL
Hgb urine dipstick: NEGATIVE
Ketones, ur: NEGATIVE mg/dL
Nitrite: NEGATIVE
Protein, ur: NEGATIVE mg/dL
Specific Gravity, Urine: 1.015 (ref 1.005–1.030)
Urobilinogen, UA: 0.2 mg/dL (ref 0.0–1.0)
pH: 7 (ref 5.0–8.0)

## 2014-07-22 ENCOUNTER — Other Ambulatory Visit: Payer: Medicaid Other

## 2014-07-23 ENCOUNTER — Telehealth: Payer: Self-pay

## 2014-07-23 ENCOUNTER — Telehealth: Payer: Self-pay | Admitting: *Deleted

## 2014-07-23 NOTE — Telephone Encounter (Signed)
Called pt and informed pt that she missed her 3 hr appt that we need to reschedule.  Pt stated that she forgot to come in and that she will be in on Thursday, June 16th for her 3 hr.  I advised pt to come in at 0800 and have nothing by mouth the midnight before and that she will need to be here for the entire 3 hrs at the facility.  Pt stated understanding with no further questions.

## 2014-07-23 NOTE — Telephone Encounter (Signed)
Called pt and left message that she missed an appt in our office yesterday for a test. The test (3hr GTT) is very important for the health and well-being of both she and her baby. Please call back to re-schedule the appt. If she has questions, she may leave a message for the nurse to call her back.

## 2014-07-23 NOTE — Telephone Encounter (Signed)
-----   Message from Darrel Hoover, RN sent at 07/23/2014  3:58 PM EDT ----- Regarding: Please call Please call this patient.  She was scheduled for 3 hour gtt yesterday and no showed.  She needs to be seen ASAP this week to get this done. Thanks, [employer]

## 2014-07-24 ENCOUNTER — Ambulatory Visit (HOSPITAL_COMMUNITY)
Admission: RE | Admit: 2014-07-24 | Discharge: 2014-07-24 | Disposition: A | Discharge status: Home / Self Care | Payer: Medicaid Other | Source: Ambulatory Visit | Attending: Family | Admitting: Family

## 2014-07-24 VITALS — BP 123/56 | HR 84 | Wt 240.1 lb

## 2014-07-24 DIAGNOSIS — O10913 Unspecified pre-existing hypertension complicating pregnancy, third trimester: Secondary | ICD-10-CM | POA: Diagnosis present

## 2014-07-24 DIAGNOSIS — O09523 Supervision of elderly multigravida, third trimester: Secondary | ICD-10-CM | POA: Diagnosis not present

## 2014-07-24 DIAGNOSIS — Z3A30 30 weeks gestation of pregnancy: Secondary | ICD-10-CM | POA: Insufficient documentation

## 2014-07-24 DIAGNOSIS — O10912 Unspecified pre-existing hypertension complicating pregnancy, second trimester: Secondary | ICD-10-CM

## 2014-07-24 DIAGNOSIS — O09293 Supervision of pregnancy with other poor reproductive or obstetric history, third trimester: Secondary | ICD-10-CM | POA: Diagnosis not present

## 2014-07-24 DIAGNOSIS — O99210 Obesity complicating pregnancy, unspecified trimester: Secondary | ICD-10-CM | POA: Insufficient documentation

## 2014-07-24 DIAGNOSIS — O169 Unspecified maternal hypertension, unspecified trimester: Secondary | ICD-10-CM | POA: Insufficient documentation

## 2014-07-24 DIAGNOSIS — O163 Unspecified maternal hypertension, third trimester: Secondary | ICD-10-CM

## 2014-07-26 ENCOUNTER — Other Ambulatory Visit: Payer: Medicaid Other

## 2014-07-31 ENCOUNTER — Encounter: Payer: Self-pay | Admitting: Family Medicine

## 2014-07-31 ENCOUNTER — Ambulatory Visit (INDEPENDENT_AMBULATORY_CARE_PROVIDER_SITE_OTHER): Payer: Medicaid Other | Admitting: Family Medicine

## 2014-07-31 VITALS — BP 121/65 | HR 78 | Temp 98.3°F | Wt 239.2 lb

## 2014-07-31 DIAGNOSIS — K047 Periapical abscess without sinus: Secondary | ICD-10-CM

## 2014-07-31 MED ORDER — OXYCODONE-ACETAMINOPHEN 5-325 MG PO TABS: 1.0000 | ORAL_TABLET | Freq: Three times a day (TID) | ORAL | Status: DC | PRN

## 2014-07-31 MED ORDER — AMOXICILLIN-POT CLAVULANATE 875-125 MG PO TABS: 1.0000 | ORAL_TABLET | Freq: Two times a day (BID) | ORAL | Status: DC

## 2014-07-31 NOTE — Patient Instructions (Signed)
You should take the antibiotic 3 times per day  Take pain medication as little as possible Call the dentists below until you find one who can give you an appointment in the next couple weeks.   Dental list  These dentists all accept Medicaid.  The list is for your convenience in choosing your child's dentist. Estos dentistas aceptan Medicaid.  La lista es para su [place] y es una cortesa.     [facility]     [phone] [address]  [address] [place] [address] Se habla espaol From 82 to 27 years old Parent may go with child Vinson Moselle DDS     [phone] [address] [place]  [address] Se habla espaol From 50 to 84 years old Parent may NOT go with child  Marolyn Hammock DMD    [phone] [address] [place] [address] Se habla espaol [place] spoken From 72 years old Parent may go with child [facility]     [phone] [address]. [place] [address] Se habla espaol From 16 to 4 years old Parent may NOT go with child  Winfield Rast DDS     [phone] [facility]      [address]  [place] [address] No se habla espaol From teeth coming in Parent may go with child  [facility]     [phone] [address] [place]. [place] [address] Requires certification. Call for information. Requiere certificacin. Llame para informacin. Algunos dias se habla espaol  From birth to 20 years Parent possibly goes with child  Bradd Canary DDS     [phone] [address]  [address] [place] [address] Se habla espaol From 18 months to 18 years  Parent may go with child  J. [place] DDS    [phone] Garlon Hatchet DDS [address] [place] [address] Se habla espaol From 74 year old Parent may go with child  Melynda Ripple DDS    [phone] [address] [place] [address] Se habla espaol  From 74 months old Parent may  go with child Dorian Pod DDS    [phone] [address] [place] [address] Se habla espaol From 44 to 35 years old Parent may go with child  [facility]    [phone] [address] [place] [address] No se habla espaol From birth Parent may not go with child

## 2014-07-31 NOTE — Progress Notes (Signed)
Subjective: Kendra Harris is a 34 y.o. G4P3 at [redacted]w[redacted]d presenting for tooth ache.   Gradual onset over past 3 days of severe pain centering around a bottom right molar which cracked about 1 year ago. Pain is constant, keeping her from chewing on that side. She has noted brown debris on her toothbrush from that tooth but has noted no other discharge. She denies fever, chills, night sweats, eye pain, sinus pain/pressure.   - ROS: Denies HA, visual changes, abd pain, VB, loss of fluid, UC's. +GFM.  - Non smoker  Objective: BP 121/65 mmHg  Pulse 78  Temp(Src) 98.3 F (36.8 C)  Wt 239 lb 3.2 oz (108.5 kg)  LMP 01/14/2014 Gen: Gravid, well-appearing 34 y.o. female in no distress Oral: Right lower molar (#30?) brown and nearly absent with underlying erythematous gingivae on a background of extensive gingival erosions and evidence of caries. +pain with palpation of tooth and underlying gingiva.   Assessment/Plan: Kendra Harris is a 34 y.o. female here for gingivitis/periodontitis with possible early abscess formation. - While there si no evidence of systemic infection, I believe this high risk pregnant patient needs an urgent referral to a dental specialist for diagnosis guided by imaging and treatment beyond medical therapy. Gave list of medicaid-accepting dentists and will place referral.  - Treat early abscess vs. prophylaxis for development of abscess formation with augmentin.  - Usual precautions with fluoride-containing toothpaste reviewed.  - Rx: Percocet 5/325mg  q8h prn severe pain  #20  R: zero - Has routine prenatal appointment tomorrow at [facility] clinic.

## 2014-07-31 NOTE — Progress Notes (Signed)
Dr Jarvis Newcomer discussed patient with me.

## 2014-08-01 ENCOUNTER — Encounter (HOSPITAL_COMMUNITY): Payer: Self-pay | Admitting: *Deleted

## 2014-08-01 ENCOUNTER — Inpatient Hospital Stay (HOSPITAL_COMMUNITY)
Admission: AD | Admit: 2014-08-01 | Discharge: 2014-08-01 | Disposition: A | Discharge status: Home / Self Care | Payer: Medicaid Other | Source: Ambulatory Visit | Attending: Obstetrics & Gynecology | Admitting: Obstetrics & Gynecology

## 2014-08-01 ENCOUNTER — Encounter: Payer: Medicaid Other | Admitting: Family

## 2014-08-01 DIAGNOSIS — N949 Unspecified condition associated with female genital organs and menstrual cycle: Secondary | ICD-10-CM | POA: Diagnosis not present

## 2014-08-01 DIAGNOSIS — O26893 Other specified pregnancy related conditions, third trimester: Secondary | ICD-10-CM

## 2014-08-01 DIAGNOSIS — O9989 Other specified diseases and conditions complicating pregnancy, childbirth and the puerperium: Secondary | ICD-10-CM | POA: Insufficient documentation

## 2014-08-01 DIAGNOSIS — R102 Pelvic and perineal pain: Secondary | ICD-10-CM | POA: Diagnosis present

## 2014-08-01 DIAGNOSIS — Z3A31 31 weeks gestation of pregnancy: Secondary | ICD-10-CM | POA: Diagnosis not present

## 2014-08-01 LAB — URINALYSIS, ROUTINE W REFLEX MICROSCOPIC
Bilirubin Urine: NEGATIVE
Glucose, UA: NEGATIVE mg/dL
Hgb urine dipstick: NEGATIVE
Ketones, ur: NEGATIVE mg/dL
NITRITE: NEGATIVE
PROTEIN: NEGATIVE mg/dL
SPECIFIC GRAVITY, URINE: 1.025 (ref 1.005–1.030)
Urobilinogen, UA: 0.2 mg/dL (ref 0.0–1.0)
pH: 6.5 (ref 5.0–8.0)

## 2014-08-01 LAB — URINE MICROSCOPIC-ADD ON

## 2014-08-01 NOTE — Discharge Instructions (Signed)

## 2014-08-01 NOTE — MAU Provider Note (Signed)
History     CSN: [id number]  Arrival date and time: 08/01/14 4098   First Provider Initiated Contact with Patient 08/01/14 0827      Chief Complaint  Patient presents with  . Pelvic Pain   HPI Kendra Harris 34 y.o. [id number]  presents to MAU with increased pressure since 4am.  It is now improved and rated at 4-5/10.  She has appt in clinic for [facility] at 8:30am but presented here at 730 because of this pressure which is now better.  It was located all across her abdomen.  She states no pain, just pressure.  She has had 3 children already and states it did not feel like she was starting labor.  Denies fever, weakness, LOF, VB.  Baby is moving well.   Pt is over-due for 3 hour gtt.  She last ate at 2am.   OB History    Gravida Para Term Preterm AB TAB SAB Ectopic Multiple Living   0 0 0 0 0 0 3      Past Medical History  Diagnosis Date  . DELAYED MENSES 02/11/2010    Qualifier: Diagnosis of  By: Wallene Huh  MD, Rande Lawman    . Urinary tract infection 03/04/2011  . Hypertension     Past Surgical History  Procedure Laterality Date  . No past surgeries      History reviewed. No pertinent family history.  History  Substance Use Topics  . Smoking status: Never Smoker   . Smokeless tobacco: Never Used  . Alcohol Use: No    Allergies: No Known Allergies  Prescriptions prior to admission  Medication Sig Dispense Refill Last Dose  . acetaminophen (TYLENOL) 500 MG tablet Take 500 mg by mouth every 6 (six) hours as needed for mild pain or headache.   Taking  . amoxicillin-clavulanate (AUGMENTIN) 875-125 MG per tablet Take 1 tablet by mouth 2 (two) times daily. 20 tablet 0   . aspirin EC 81 MG tablet Take 1 tablet (81 mg total) by mouth daily.   Taking  . labetalol (NORMODYNE) 200 MG tablet Take 1 tablet (200 mg total) by mouth 2 (two) times daily. 60 tablet 3 Taking  . oxyCODONE-acetaminophen (ROXICET) 5-325 MG per tablet Take 1 tablet by mouth every 8 (eight) hours as needed  for severe pain. 20 tablet 0   . Prenat w/o A Vit-FeFum-FePo-FA (CONCEPT OB) 130-92.4-1 MG CAPS Take 1 capsule by mouth daily. 30 capsule 3 Taking    ROS Pertinent ROS in HPI.  All other systems are negative.   Physical Exam   Blood pressure 132/74, pulse 71, resp. rate 18, height  (1.575 m), weight 239 lb 9.6 oz (108.682 kg), last menstrual period 01/14/2014.  Physical Exam  Constitutional: She is oriented to person, place, and time. She appears well-developed and well-nourished. No distress.  HENT:  Head: Normocephalic and atraumatic.  Eyes: EOM are normal.  Neck: Normal range of motion.  Cardiovascular: Normal rate.   Respiratory: Effort normal. No respiratory distress.  GI: There is no tenderness.  Genitourinary:  FFN initially collected Cervix is closed  Musculoskeletal: Normal range of motion.  Neurological: She is alert and oriented to person, place, and time.  Skin: Skin is warm and dry.  Psychiatric: She has a normal mood and affect.     MAU Course  Procedures  MDM FFN collected but discarded after noting closed cervix.  PNC appt in clinic is impt at pt overdue for 3 hour gtt.  Will  send her right down.    Assessment and Plan  A: Vag pressure in preg  P: Discharge to home  F/u in clinic immediately Patient may return to MAU as needed or if her condition were to change or worsen   Bertram Denver 08/01/2014, 8:28 AM

## 2014-08-01 NOTE — MAU Note (Addendum)
Pt stated she hads ben having pelvic pain and pressure since 3am. Denies vag discharge and bleeding. Good fetal movement reported.  Pt had appt at 0830 in [facility].  Pt states she hasn't taken her AM dose of labetalol.

## 2014-08-19 ENCOUNTER — Other Ambulatory Visit (HOSPITAL_COMMUNITY): Payer: Self-pay | Admitting: Maternal and Fetal Medicine

## 2014-08-19 DIAGNOSIS — O09523 Supervision of elderly multigravida, third trimester: Secondary | ICD-10-CM

## 2014-08-19 DIAGNOSIS — Z8632 Personal history of gestational diabetes: Secondary | ICD-10-CM

## 2014-08-19 DIAGNOSIS — O09293 Supervision of pregnancy with other poor reproductive or obstetric history, third trimester: Secondary | ICD-10-CM

## 2014-08-19 DIAGNOSIS — O10013 Pre-existing essential hypertension complicating pregnancy, third trimester: Secondary | ICD-10-CM

## 2014-08-19 DIAGNOSIS — O99213 Obesity complicating pregnancy, third trimester: Secondary | ICD-10-CM

## 2014-08-21 ENCOUNTER — Ambulatory Visit (HOSPITAL_COMMUNITY)
Admission: RE | Admit: 2014-08-21 | Discharge: 2014-08-21 | Disposition: A | Discharge status: Home / Self Care | Payer: Medicaid Other | Source: Ambulatory Visit | Attending: Family Medicine | Admitting: Family Medicine

## 2014-08-21 ENCOUNTER — Other Ambulatory Visit (HOSPITAL_COMMUNITY): Payer: Self-pay | Admitting: Maternal and Fetal Medicine

## 2014-08-21 DIAGNOSIS — O10013 Pre-existing essential hypertension complicating pregnancy, third trimester: Secondary | ICD-10-CM

## 2014-08-21 DIAGNOSIS — O99213 Obesity complicating pregnancy, third trimester: Secondary | ICD-10-CM | POA: Diagnosis not present

## 2014-08-21 DIAGNOSIS — E669 Obesity, unspecified: Secondary | ICD-10-CM | POA: Insufficient documentation

## 2014-08-21 DIAGNOSIS — Z3A00 Weeks of gestation of pregnancy not specified: Secondary | ICD-10-CM | POA: Insufficient documentation

## 2014-08-21 DIAGNOSIS — O09293 Supervision of pregnancy with other poor reproductive or obstetric history, third trimester: Secondary | ICD-10-CM

## 2014-08-21 DIAGNOSIS — Z8632 Personal history of gestational diabetes: Secondary | ICD-10-CM

## 2014-08-21 DIAGNOSIS — O09523 Supervision of elderly multigravida, third trimester: Secondary | ICD-10-CM | POA: Insufficient documentation

## 2014-08-21 DIAGNOSIS — O09529 Supervision of elderly multigravida, unspecified trimester: Secondary | ICD-10-CM | POA: Insufficient documentation

## 2014-08-21 DIAGNOSIS — O10019 Pre-existing essential hypertension complicating pregnancy, unspecified trimester: Secondary | ICD-10-CM | POA: Insufficient documentation

## 2014-08-21 DIAGNOSIS — Z3A34 34 weeks gestation of pregnancy: Secondary | ICD-10-CM | POA: Insufficient documentation

## 2014-08-21 NOTE — ED Notes (Signed)
Pt's last US recommended twice weekly testing at 32 weeks.  She has not been to a clinic appointment since June.  Called the clinic and [facility]  Pt stated she had appt Monday at 11 with the clinic. No appointment was in the system.  Obtained appt for [facility]ST in the clinic on Monday at 1pm.  Gave patient a printout with appointment on it.

## 2014-08-26 ENCOUNTER — Other Ambulatory Visit: Payer: Medicaid Other

## 2014-08-27 ENCOUNTER — Other Ambulatory Visit: Payer: Medicaid Other

## 2014-08-29 ENCOUNTER — Other Ambulatory Visit: Payer: Medicaid Other

## 2014-09-02 ENCOUNTER — Ambulatory Visit (INDEPENDENT_AMBULATORY_CARE_PROVIDER_SITE_OTHER): Payer: Medicaid Other | Admitting: Family Medicine

## 2014-09-02 VITALS — BP 126/83 | HR 79 | Wt 237.6 lb

## 2014-09-02 DIAGNOSIS — E669 Obesity, unspecified: Secondary | ICD-10-CM | POA: Diagnosis not present

## 2014-09-02 DIAGNOSIS — O0991 Supervision of high risk pregnancy, unspecified, first trimester: Secondary | ICD-10-CM

## 2014-09-02 DIAGNOSIS — O99213 Obesity complicating pregnancy, third trimester: Secondary | ICD-10-CM | POA: Diagnosis not present

## 2014-09-02 DIAGNOSIS — O10913 Unspecified pre-existing hypertension complicating pregnancy, third trimester: Secondary | ICD-10-CM | POA: Diagnosis present

## 2014-09-02 LAB — POCT URINALYSIS DIP (DEVICE)
GLUCOSE, UA: NEGATIVE mg/dL
HGB URINE DIPSTICK: NEGATIVE
Ketones, ur: 40 mg/dL — AB
Leukocytes, UA: NEGATIVE
Nitrite: NEGATIVE
Protein, ur: 30 mg/dL — AB
Specific Gravity, Urine: >=1.03 (ref 1.005–1.030)
UROBILINOGEN UA: 0.2 mg/dL (ref 0.0–1.0)
pH: 6 (ref 5.0–8.0)

## 2014-09-02 LAB — OB RESULTS CONSOLE GC/CHLAMYDIA
Chlamydia: NEGATIVE
GC PROBE AMP, GENITAL: NEGATIVE

## 2014-09-02 LAB — OB RESULTS CONSOLE GBS: GBS: POSITIVE

## 2014-09-02 NOTE — Patient Instructions (Signed)
Third Trimester of Pregnancy The third trimester is from week 29 through week 42, months 7 through 9. The third trimester is a time when the fetus is growing rapidly. At the end of the ninth month, the fetus is about 20 inches in length and weighs 6-10 pounds.  BODY CHANGES Your body goes through many changes during pregnancy. The changes vary from woman to woman.   Your weight will continue to increase. You can expect to gain 25-35 pounds (11-16 kg) by the end of the pregnancy.  You may begin to get stretch marks on your hips, abdomen, and breasts.  You may urinate more often because the fetus is moving lower into your pelvis and pressing on your bladder.  You may develop or continue to have heartburn as a result of your pregnancy.  You may develop constipation because certain hormones are causing the muscles that push waste through your intestines to slow down.  You may develop hemorrhoids or swollen, bulging veins (varicose veins).  You may have pelvic pain because of the weight gain and pregnancy hormones relaxing your joints between the bones in your pelvis. Backaches may result from overexertion of the muscles supporting your posture.  You may have changes in your hair. These can include thickening of your hair, rapid growth, and changes in texture. Some women also have hair loss during or after pregnancy, or hair that feels dry or thin. Your hair will most likely return to normal after your baby is born.  Your breasts will continue to grow and be tender. A yellow discharge may leak from your breasts called colostrum.  Your belly button may stick out.  You may feel short of breath because of your expanding uterus.  You may notice the fetus "dropping," or moving lower in your abdomen.  You may have a bloody mucus discharge. This usually occurs a few days to a week before labor begins.  Your cervix becomes thin and soft (effaced) near your due date. WHAT TO EXPECT AT YOUR  PRENATAL EXAMS  You will have prenatal exams every 2 weeks until week 36. Then, you will have weekly prenatal exams. During a routine prenatal visit:  You will be weighed to make sure you and the fetus are growing normally.  Your blood pressure is taken.  Your abdomen will be measured to track your baby's growth.  The fetal heartbeat will be listened to.  Any test results from the previous visit will be discussed.  You may have a cervical check near your due date to see if you have effaced. At around 36 weeks, your caregiver will check your cervix. At the same time, your caregiver will also perform a test on the secretions of the vaginal tissue. This test is to determine if a type of bacteria, Group B streptococcus, is present. Your caregiver will explain this further. Your caregiver may ask you:  What your birth plan is.  How you are feeling.  If you are feeling the baby move.  If you have had any abnormal symptoms, such as leaking fluid, bleeding, severe headaches, or abdominal cramping.  If you have any questions. Other tests or screenings that may be performed during your third trimester include:  Blood tests that check for low iron levels (anemia).  Fetal testing to check the health, activity level, and growth of the fetus. Testing is done if you have certain medical conditions or if there are problems during the pregnancy. FALSE LABOR You may feel small, irregular contractions that   eventually go away. These are called Braxton Hicks contractions, or false labor. Contractions may last for hours, days, or even weeks before true labor sets in. If contractions come at regular intervals, intensify, or become painful, it is best to be seen by your caregiver.  SIGNS OF LABOR   Menstrual-like cramps.  Contractions that are 5 minutes apart or less.  Contractions that start on the top of the uterus and spread down to the lower abdomen and back.  A sense of increased pelvic  pressure or back pain.  A watery or bloody mucus discharge that comes from the vagina. If you have any of these signs before the 37th week of pregnancy, call your caregiver right away. You need to go to the hospital to get checked immediately. HOME CARE INSTRUCTIONS   Avoid all smoking, herbs, alcohol, and unprescribed drugs. These chemicals affect the formation and growth of the baby.  Follow your caregiver's instructions regarding medicine use. There are medicines that are either safe or unsafe to take during pregnancy.  Exercise only as directed by your caregiver. Experiencing uterine cramps is a good sign to stop exercising.  Continue to eat regular, healthy meals.  Wear a good support bra for breast tenderness.  Do not use hot tubs, steam rooms, or saunas.  Wear your seat belt at all times when driving.  Avoid raw meat, uncooked cheese, cat litter boxes, and soil used by cats. These carry germs that can cause birth defects in the baby.  Take your prenatal vitamins.  Try taking a stool softener (if your caregiver approves) if you develop constipation. Eat more high-fiber foods, such as fresh vegetables or fruit and whole grains. Drink plenty of fluids to keep your urine clear or pale yellow.  Take warm sitz baths to soothe any pain or discomfort caused by hemorrhoids. Use hemorrhoid cream if your caregiver approves.  If you develop varicose veins, wear support hose. Elevate your feet for 15 minutes, 3-4 times a day. Limit salt in your diet.  Avoid heavy lifting, wear low heal shoes, and practice good posture.  Rest a lot with your legs elevated if you have leg cramps or low back pain.  Visit your dentist if you have not gone during your pregnancy. Use a soft toothbrush to brush your teeth and be gentle when you floss.  A sexual relationship may be continued unless your caregiver directs you otherwise.  Do not travel far distances unless it is absolutely necessary and only  with the approval of your caregiver.  Take prenatal classes to understand, practice, and ask questions about the labor and delivery.  Make a trial run to the hospital.  Pack your hospital bag.  Prepare the baby's nursery.  Continue to go to all your prenatal visits as directed by your caregiver. SEEK MEDICAL CARE IF:  You are unsure if you are in labor or if your water has broken.  You have dizziness.  You have mild pelvic cramps, pelvic pressure, or nagging pain in your abdominal area.  You have persistent nausea, vomiting, or diarrhea.  You have a bad smelling vaginal discharge.  You have pain with urination. SEEK IMMEDIATE MEDICAL CARE IF:   You have a fever.  You are leaking fluid from your vagina.  You have spotting or bleeding from your vagina.  You have severe abdominal cramping or pain.  You have rapid weight loss or gain.  You have shortness of breath with chest pain.  You notice sudden or extreme swelling   of your face, hands, ankles, feet, or legs.  You have not felt your baby move in over an hour.  You have severe headaches that do not go away with medicine.  You have vision changes. Document Released: 01/19/2001 Document Revised: 01/30/2013 Document Reviewed: 03/28/2012 [employer] Patient Information 2015 [employer]. This information is not intended to replace advice given to you by your health care provider. Make sure you discuss any questions you have with your health care provider.  Breastfeeding Deciding to breastfeed is one of the best choices you can make for you and your baby. A change in hormones during pregnancy causes your breast tissue to grow and increases the number and size of your milk ducts. These hormones also allow proteins, sugars, and fats from your blood supply to make breast milk in your milk-producing glands. Hormones prevent breast milk from being released before your baby is born as well as prompt milk flow after birth. Once  breastfeeding has begun, thoughts of your baby, as well as his or her sucking or crying, can stimulate the release of milk from your milk-producing glands.  BENEFITS OF BREASTFEEDING For Your Baby  Your first milk (colostrum) helps your baby's digestive system function better.   There are antibodies in your milk that help your baby fight off infections.   Your baby has a lower incidence of asthma, allergies, and sudden infant death syndrome.   The nutrients in breast milk are better for your baby than infant formulas and are designed uniquely for your baby's needs.   Breast milk improves your baby's brain development.   Your baby is less likely to develop other conditions, such as childhood obesity, asthma, or type 2 diabetes mellitus.  For You   Breastfeeding helps to create a very special bond between you and your baby.   Breastfeeding is convenient. Breast milk is always available at the correct temperature and costs nothing.   Breastfeeding helps to burn calories and helps you lose the weight gained during pregnancy.   Breastfeeding makes your uterus contract to its prepregnancy size faster and slows bleeding (lochia) after you give birth.   Breastfeeding helps to lower your risk of developing type 2 diabetes mellitus, osteoporosis, and breast or ovarian cancer later in life. SIGNS THAT YOUR BABY IS HUNGRY Early Signs of Hunger  Increased alertness or activity.  Stretching.  Movement of the head from side to side.  Movement of the head and opening of the mouth when the corner of the mouth or cheek is stroked (rooting).  Increased sucking sounds, smacking lips, cooing, sighing, or squeaking.  Hand-to-mouth movements.  Increased sucking of fingers or hands. Late Signs of Hunger  Fussing.  Intermittent crying. Extreme Signs of Hunger Signs of extreme hunger will require calming and consoling before your baby will be able to breastfeed successfully. Do not  wait for the following signs of extreme hunger to occur before you initiate breastfeeding:   Restlessness.  A loud, strong cry.   Screaming. BREASTFEEDING BASICS Breastfeeding Initiation  Find a comfortable place to sit or lie down, with your neck and back well supported.  Place a pillow or rolled up blanket under your baby to bring him or her to the level of your breast (if you are seated). Nursing pillows are specially designed to help support your arms and your baby while you breastfeed.  Make sure that your baby's abdomen is facing your abdomen.   Gently massage your breast. With your fingertips, massage from your chest   wall toward your nipple in a circular motion. This encourages milk flow. You may need to continue this action during the feeding if your milk flows slowly.  Support your breast with 4 fingers underneath and your thumb above your nipple. Make sure your fingers are well away from your nipple and your baby's mouth.   Stroke your baby's lips gently with your finger or nipple.   When your baby's mouth is open wide enough, quickly bring your baby to your breast, placing your entire nipple and as much of the colored area around your nipple (areola) as possible into your baby's mouth.   More areola should be visible above your baby's upper lip than below the lower lip.   Your baby's tongue should be between his or her lower gum and your breast.   Ensure that your baby's mouth is correctly positioned around your nipple (latched). Your baby's lips should create a seal on your breast and be turned out (everted).  It is common for your baby to suck about 2-3 minutes in order to start the flow of breast milk. Latching Teaching your baby how to latch on to your breast properly is very important. An improper latch can cause nipple pain and decreased milk supply for you and poor weight gain in your baby. Also, if your baby is not latched onto your nipple properly, he or she  may swallow some air during feeding. This can make your baby fussy. Burping your baby when you switch breasts during the feeding can help to get rid of the air. However, teaching your baby to latch on properly is still the best way to prevent fussiness from swallowing air while breastfeeding. Signs that your baby has successfully latched on to your nipple:    Silent tugging or silent sucking, without causing you pain.   Swallowing heard between every 3-4 sucks.    Muscle movement above and in front of his or her ears while sucking.  Signs that your baby has not successfully latched on to nipple:   Sucking sounds or smacking sounds from your baby while breastfeeding.  Nipple pain. If you think your baby has not latched on correctly, slip your finger into the corner of your baby's mouth to break the suction and place it between your baby's gums. Attempt breastfeeding initiation again. Signs of Successful Breastfeeding Signs from your baby:   A gradual decrease in the number of sucks or complete cessation of sucking.   Falling asleep.   Relaxation of his or her body.   Retention of a small amount of milk in his or her mouth.   Letting go of your breast by himself or herself. Signs from you:  Breasts that have increased in firmness, weight, and size 1-3 hours after feeding.   Breasts that are softer immediately after breastfeeding.  Increased milk volume, as well as a change in milk consistency and color by the fifth day of breastfeeding.   Nipples that are not sore, cracked, or bleeding. Signs That Your Baby is Getting Enough Milk  Wetting at least 3 diapers in a 24-hour period. The urine should be clear and pale yellow by age 5 days.  At least 3 stools in a 24-hour period by age 5 days. The stool should be soft and yellow.  At least 3 stools in a 24-hour period by age 7 days. The stool should be seedy and yellow.  No loss of weight greater than 10% of birth weight  during the first 3   days of age.  Average weight gain of 4-7 ounces (113-198 g) per week after age 4 days.  Consistent daily weight gain by age 5 days, without weight loss after the age of 2 weeks. After a feeding, your baby may spit up a small amount. This is common. BREASTFEEDING FREQUENCY AND DURATION Frequent feeding will help you make more milk and can prevent sore nipples and breast engorgement. Breastfeed when you feel the need to reduce the fullness of your breasts or when your baby shows signs of hunger. This is called "breastfeeding on demand." Avoid introducing a pacifier to your baby while you are working to establish breastfeeding (the first 4-6 weeks after your baby is born). After this time you may choose to use a pacifier. Research has shown that pacifier use during the first year of a baby's life decreases the risk of sudden infant death syndrome (SIDS). Allow your baby to feed on each breast as long as he or she wants. Breastfeed until your baby is finished feeding. When your baby unlatches or falls asleep while feeding from the first breast, offer the second breast. Because newborns are often sleepy in the first few weeks of life, you may need to awaken your baby to get him or her to feed. Breastfeeding times will vary from baby to baby. However, the following rules can serve as a guide to help you ensure that your baby is properly fed:  Newborns (babies 4 weeks of age or younger) may breastfeed every 1-3 hours.  Newborns should not go longer than 3 hours during the day or 5 hours during the night without breastfeeding.  You should breastfeed your baby a minimum of 8 times in a 24-hour period until you begin to introduce solid foods to your baby at around 6 months of age. BREAST MILK PUMPING Pumping and storing breast milk allows you to ensure that your baby is exclusively fed your breast milk, even at times when you are unable to breastfeed. This is especially important if you are  going back to work while you are still breastfeeding or when you are not able to be present during feedings. Your lactation consultant can give you guidelines on how long it is safe to store breast milk.  A breast pump is a machine that allows you to pump milk from your breast into a sterile bottle. The pumped breast milk can then be stored in a refrigerator or freezer. Some breast pumps are operated by hand, while others use electricity. Ask your lactation consultant which type will work best for you. Breast pumps can be purchased, but some hospitals and breastfeeding support groups lease breast pumps on a monthly basis. A lactation consultant can teach you how to hand express breast milk, if you prefer not to use a pump.  CARING FOR YOUR BREASTS WHILE YOU BREASTFEED Nipples can become dry, cracked, and sore while breastfeeding. The following recommendations can help keep your breasts moisturized and healthy:  Avoid using soap on your nipples.   Wear a supportive bra. Although not required, special nursing bras and tank tops are designed to allow access to your breasts for breastfeeding without taking off your entire bra or top. Avoid wearing underwire-style bras or extremely tight bras.  Air dry your nipples for 3-4minutes after each feeding.   Use only cotton bra pads to absorb leaked breast milk. Leaking of breast milk between feedings is normal.   Use lanolin on your nipples after breastfeeding. Lanolin helps to maintain your skin's   normal moisture barrier. If you use pure lanolin, you do not need to wash it off before feeding your baby again. Pure lanolin is not toxic to your baby. You may also hand express a few drops of breast milk and gently massage that milk into your nipples and allow the milk to air dry. In the first few weeks after giving birth, some women experience extremely full breasts (engorgement). Engorgement can make your breasts feel heavy, warm, and tender to the touch.  Engorgement peaks within 3-5 days after you give birth. The following recommendations can help ease engorgement:  Completely empty your breasts while breastfeeding or pumping. You may want to start by applying warm, moist heat (in the shower or with warm water-soaked hand towels) just before feeding or pumping. This increases circulation and helps the milk flow. If your baby does not completely empty your breasts while breastfeeding, pump any extra milk after he or she is finished.  Wear a snug bra (nursing or regular) or tank top for 1-2 days to signal your body to slightly decrease milk production.  Apply ice packs to your breasts, unless this is too uncomfortable for you.  Make sure that your baby is latched on and positioned properly while breastfeeding. If engorgement persists after 48 hours of following these recommendations, contact your health care provider or a lactation consultant. OVERALL HEALTH CARE RECOMMENDATIONS WHILE BREASTFEEDING  Eat healthy foods. Alternate between meals and snacks, eating 3 of each per day. Because what you eat affects your breast milk, some of the foods may make your baby more irritable than usual. Avoid eating these foods if you are sure that they are negatively affecting your baby.  Drink milk, fruit juice, and water to satisfy your thirst (about 10 glasses a day).   Rest often, relax, and continue to take your prenatal vitamins to prevent fatigue, stress, and anemia.  Continue breast self-awareness checks.  Avoid chewing and smoking tobacco.  Avoid alcohol and drug use. Some medicines that may be harmful to your baby can pass through breast milk. It is important to ask your health care provider before taking any medicine, including all over-the-counter and prescription medicine as well as vitamin and herbal supplements. It is possible to become pregnant while breastfeeding. If birth control is desired, ask your health care provider about options that  will be safe for your baby. SEEK MEDICAL CARE IF:   You feel like you want to stop breastfeeding or have become frustrated with breastfeeding.  You have painful breasts or nipples.  Your nipples are cracked or bleeding.  Your breasts are red, tender, or warm.  You have a swollen area on either breast.  You have a fever or chills.  You have nausea or vomiting.  You have drainage other than breast milk from your nipples.  Your breasts do not become full before feedings by the fifth day after you give birth.  You feel sad and depressed.  Your baby is too sleepy to eat well.  Your baby is having trouble sleeping.   Your baby is wetting less than 3 diapers in a 24-hour period.  Your baby has less than 3 stools in a 24-hour period.  Your baby's skin or the white part of his or her eyes becomes yellow.   Your baby is not gaining weight by 5 days of age. SEEK IMMEDIATE MEDICAL CARE IF:   Your baby is overly tired (lethargic) and does not want to wake up and feed.  Your baby   develops an unexplained fever. Document Released: 01/25/2005 Document Revised: 01/30/2013 Document Reviewed: 07/19/2012 [employer] Patient Information 2015 [employer]. This information is not intended to replace advice given to you by your health care provider. Make sure you discuss any questions you have with your health care provider.  

## 2014-09-02 NOTE — Progress Notes (Signed)
Pt has not kept scheduled appts for fetal testing and prenatal care - last seen on 07/18/14.   [place] for growth done 7/13.

## 2014-09-03 NOTE — Progress Notes (Signed)
Subjective:  Kendra Harris is a 34 y.o. [phone] at [redacted]w[redacted]d being seen today for ongoing prenatal care.  Patient reports no complaints.  Contractions: Not present.  Vag. Bleeding: None. Movement: Present. Denies leaking of fluid.   The following portions of the patient's history were reviewed and updated as appropriate: allergies, current medications, past family history, past medical history, past social history, past surgical history and problem list.   Objective:   Filed Vitals:   09/02/14 1415  BP: 126/83  Pulse: 79  Weight: 237 lb 9.6 oz (107.775 kg)    Fetal Status: Fetal Heart Rate (bpm): NST   Movement: Present  Presentation: Vertex  General:  Alert, oriented and cooperative. Patient is in no acute distress.  Skin: Skin is warm and dry. No rash noted.   Cardiovascular: Normal heart rate noted  Respiratory: Normal respiratory effort, no problems with respiration noted  Abdomen: Soft, gravid, appropriate for gestational age. Pain/Pressure: Present     Vaginal: Vag. Bleeding: None.       Cervix: Not evaluated        Extremities: Normal range of motion.  Edema: None  Mental Status: Normal mood and affect. Normal behavior. Normal judgment and thought content.   Urinalysis: Urine Protein: 1+ Urine Glucose: Negative  Assessment and Plan:  Pregnancy: [id number] at [redacted]w[redacted]d  1. Chronic hypertension in pregnancy, third trimester BP looks good today - US OB Follow Up; Future - Amniotic fluid index with NST  2. Supervision of high-risk pregnancy, first trimester Continue routine prenatal care.  - GC/Chlamydia Probe Amp - Culture, beta strep (group b only)  3. Obesity in pregnancy, antepartum, third trimester Abnormal 1 hour, has not had 3 hour--will schedule for this week.  Term labor symptoms and general obstetric precautions including but not limited to vaginal bleeding, contractions, leaking of fluid and fetal movement were reviewed in detail with the patient. Please refer to  After Visit Summary for other counseling recommendations.  Return in 4 days (on 09/06/2014) for 2x/wk as scheduled.   Reva Bores, MD

## 2014-09-04 LAB — GC/CHLAMYDIA PROBE AMP
CT PROBE, AMP APTIMA: NEGATIVE
GC Probe RNA: NEGATIVE

## 2014-09-04 LAB — CULTURE, BETA STREP (GROUP B ONLY)

## 2014-09-05 ENCOUNTER — Encounter: Payer: Self-pay | Admitting: Family Medicine

## 2014-09-05 DIAGNOSIS — O99820 Streptococcus B carrier state complicating pregnancy: Secondary | ICD-10-CM | POA: Insufficient documentation

## 2014-09-06 ENCOUNTER — Ambulatory Visit (INDEPENDENT_AMBULATORY_CARE_PROVIDER_SITE_OTHER): Payer: Medicaid Other | Admitting: *Deleted

## 2014-09-06 VITALS — BP 128/73 | HR 67

## 2014-09-06 DIAGNOSIS — O10913 Unspecified pre-existing hypertension complicating pregnancy, third trimester: Secondary | ICD-10-CM

## 2014-09-06 DIAGNOSIS — R7309 Other abnormal glucose: Secondary | ICD-10-CM

## 2014-09-06 LAB — GLUCOSE TOLERANCE, 3 HOURS
Glucose Tolerance, 1 hour: 175 mg/dL (ref 70–189)
Glucose Tolerance, 2 hour: 129 mg/dL (ref 70–164)
Glucose Tolerance, Fasting: 89 mg/dL (ref 65–99)
Glucose, GTT - 3 Hour: 63 mg/dL — ABNORMAL LOW (ref 70–144)

## 2014-09-06 NOTE — Progress Notes (Signed)
3 hr GTT today 

## 2014-09-06 NOTE — Progress Notes (Signed)
NST performed today was reviewed and was found to be reactive.  Continue recommended antenatal testing and prenatal care.  

## 2014-09-06 NOTE — Addendum Note (Signed)
Addended by: Candelaria Stagers E on: 09/06/2014 11:26 AM   Modules accepted: Orders

## 2014-09-10 ENCOUNTER — Ambulatory Visit (INDEPENDENT_AMBULATORY_CARE_PROVIDER_SITE_OTHER): Payer: Medicaid Other | Admitting: Obstetrics and Gynecology

## 2014-09-10 VITALS — BP 120/68 | HR 71 | Wt 239.1 lb

## 2014-09-10 DIAGNOSIS — O0990 Supervision of high risk pregnancy, unspecified, unspecified trimester: Secondary | ICD-10-CM

## 2014-09-10 DIAGNOSIS — Z23 Encounter for immunization: Secondary | ICD-10-CM | POA: Diagnosis not present

## 2014-09-10 DIAGNOSIS — O10913 Unspecified pre-existing hypertension complicating pregnancy, third trimester: Secondary | ICD-10-CM

## 2014-09-10 DIAGNOSIS — O0993 Supervision of high risk pregnancy, unspecified, third trimester: Secondary | ICD-10-CM

## 2014-09-10 DIAGNOSIS — O322XX0 Maternal care for transverse and oblique lie, not applicable or unspecified: Secondary | ICD-10-CM | POA: Diagnosis not present

## 2014-09-10 LAB — POCT URINALYSIS DIP (DEVICE)
Glucose, UA: NEGATIVE mg/dL
Hgb urine dipstick: NEGATIVE
KETONES UR: NEGATIVE mg/dL
Nitrite: NEGATIVE
PH: 6 (ref 5.0–8.0)
PROTEIN: 30 mg/dL — AB
Urobilinogen, UA: 1 mg/dL (ref 0.0–1.0)

## 2014-09-10 NOTE — Progress Notes (Signed)
Subjective:  Kendra Harris is a 34 y.o. [phone] at [redacted]w[redacted]d being seen today for ongoing prenatal care.  Patient reports no complaints.  Contractions: Irregular.  Vag. Bleeding: None. Movement: Present. Denies leaking of fluid.   The following portions of the patient's history were reviewed and updated as appropriate: allergies, current medications, past family history, past medical history, past social history, past surgical history and problem list.   Objective:   Filed Vitals:   09/10/14 1018  BP: 120/68  Pulse: 71  Weight: 239 lb 1.6 oz (108.455 kg)    Fetal Status: Fetal Heart Rate (bpm): NST   Movement: Present  Presentation: Transverse (head on maternal Rt, spine up)  General:  Alert, oriented and cooperative. Patient is in no acute distress.  Skin: Skin is warm and dry. No rash noted.   Cardiovascular: Normal heart rate noted  Respiratory: Normal respiratory effort, no problems with respiration noted  Abdomen: Soft, gravid, appropriate for gestational age. Pain/Pressure: Present     Vaginal: Vag. Bleeding: None.       Cervix: Not evaluated        Extremities: Normal range of motion.     Mental Status: Normal mood and affect. Normal behavior. Normal judgment and thought content.   Urinalysis: Urine Protein: 1+ Urine Glucose: Negative  Assessment and Plan:  Pregnancy: G4P3003 at [redacted]w[redacted]d  1. Chronic hypertension in pregnancy, third trimester - well controlled on labetalol - 38 wk growth scan scheduled - plan to induce at 39+0 - reactive NST today, AFI wnl at 19.4  # GBS positive, no pcn allergy - pcn in labor  # Pregnancy - tdap today  # Transverse fetal lie - if transverse next week will schedule version at time of induction  Term labor symptoms and general obstetric precautions including but not limited to vaginal bleeding, contractions, leaking of fluid and fetal movement were reviewed in detail with the patient. Please refer to After Visit Summary for other  counseling recommendations.  F/u 3 days for NST  Kathrynn Running, MD

## 2014-09-10 NOTE — Progress Notes (Signed)
Breastfeeding tip of the week reviewed.  Pt states she ran out of ASA 81 mg and did not purchase - didn't think she needed it.  Pt advised to resume taking in order to reduce risk of developing pre-eclampsia. She voiced understanding and agreed.

## 2014-09-13 ENCOUNTER — Ambulatory Visit (INDEPENDENT_AMBULATORY_CARE_PROVIDER_SITE_OTHER): Payer: Medicaid Other | Admitting: *Deleted

## 2014-09-13 VITALS — BP 128/72 | HR 68

## 2014-09-13 DIAGNOSIS — O10913 Unspecified pre-existing hypertension complicating pregnancy, third trimester: Secondary | ICD-10-CM | POA: Diagnosis not present

## 2014-09-13 NOTE — Progress Notes (Signed)
IOL scheduled 8/12 @ 0730 - may need version if still transverse fetal lie.

## 2014-09-13 NOTE — Progress Notes (Signed)
NST performed today was reviewed and was found to be reactive.  Continue recommended antenatal testing and prenatal care.  

## 2014-09-16 ENCOUNTER — Encounter: Payer: Self-pay | Admitting: *Deleted

## 2014-09-16 ENCOUNTER — Ambulatory Visit (HOSPITAL_COMMUNITY)
Admission: RE | Admit: 2014-09-16 | Discharge: 2014-09-16 | Disposition: A | Discharge status: Home / Self Care | Payer: Medicaid Other | Source: Ambulatory Visit | Attending: Family Medicine | Admitting: Family Medicine

## 2014-09-16 ENCOUNTER — Telehealth (HOSPITAL_COMMUNITY): Payer: Self-pay | Admitting: *Deleted

## 2014-09-16 ENCOUNTER — Encounter (HOSPITAL_COMMUNITY): Payer: Self-pay | Admitting: *Deleted

## 2014-09-16 ENCOUNTER — Ambulatory Visit (INDEPENDENT_AMBULATORY_CARE_PROVIDER_SITE_OTHER): Payer: Medicaid Other | Admitting: Obstetrics and Gynecology

## 2014-09-16 VITALS — BP 138/84 | HR 63 | Temp 98.3°F | Wt 238.1 lb

## 2014-09-16 DIAGNOSIS — O10913 Unspecified pre-existing hypertension complicating pregnancy, third trimester: Secondary | ICD-10-CM | POA: Insufficient documentation

## 2014-09-16 DIAGNOSIS — O0990 Supervision of high risk pregnancy, unspecified, unspecified trimester: Secondary | ICD-10-CM

## 2014-09-16 DIAGNOSIS — O0993 Supervision of high risk pregnancy, unspecified, third trimester: Secondary | ICD-10-CM

## 2014-09-16 LAB — POCT URINALYSIS DIP (DEVICE)
BILIRUBIN URINE: NEGATIVE
GLUCOSE, UA: NEGATIVE mg/dL
Hgb urine dipstick: NEGATIVE
Ketones, ur: NEGATIVE mg/dL
Nitrite: NEGATIVE
Protein, ur: 30 mg/dL — AB
Specific Gravity, Urine: >=1.03 (ref 1.005–1.030)
Urobilinogen, UA: 2 mg/dL — ABNORMAL HIGH (ref 0.0–1.0)
pH: 6.5 (ref 5.0–8.0)

## 2014-09-16 NOTE — Progress Notes (Signed)
Subjective:  ANIESA BOBACK is a 34 y.o. [phone] at [redacted]w[redacted]d being seen today for ongoing prenatal care.  Patient reports no complaints.  Contractions: Not present.  Vag. Bleeding: None. Movement: Present. Denies leaking of fluid.   The following portions of the patient's history were reviewed and updated as appropriate: allergies, current medications, past family history, past medical history, past social history, past surgical history and problem list.   Objective:   Filed Vitals:   09/16/14 1121  BP: 138/84  Pulse: 63  Temp: 98.3 F (36.8 C)  Weight: 238 lb 1.6 oz (108.001 kg)    Fetal Status: Fetal Heart Rate (bpm): 132 Fundal Height: 38 cm Movement: Present     General:  Alert, oriented and cooperative. Patient is in no acute distress.  Skin: Skin is warm and dry. No rash noted.   Cardiovascular: Normal heart rate noted  Respiratory: Normal respiratory effort, no problems with respiration noted  Abdomen: Soft, gravid, appropriate for gestational age. Pain/Pressure: Present     Pelvic: Vag. Bleeding: None     Cervical exam deferred        Extremities: Normal range of motion.  Edema: None  Mental Status: Normal mood and affect. Normal behavior. Normal judgment and thought content.   Urinalysis: Urine Protein: 1+ Urine Glucose: Negative  Assessment and Plan:  Pregnancy: G4P3003 at [redacted]w[redacted]d  1. Chronic hypertension in pregnancy, third trimester - bp wnl on labetalol - Fetal nonstress test - IOL scheduled at 39+0 - 38-week growth scan 62nd percentile, afi wnl  # Contraception - BTL paper signed and sent to be scanned  Term labor symptoms and general obstetric precautions including but not limited to vaginal bleeding, contractions, leaking of fluid and fetal movement were reviewed in detail with the patient. Please refer to After Visit Summary for other counseling recommendations.    Kathrynn Running, MD

## 2014-09-16 NOTE — Progress Notes (Signed)
Subjective:  Kendra Harris is a 34 y.o. [phone] at [redacted]w[redacted]d being seen today for ongoing prenatal care.  Patient reports no complaints.  Contractions: Not present.  Vag. Bleeding: None. Movement: Present. Denies leaking of fluid.   The following portions of the patient's history were reviewed and updated as appropriate: allergies, current medications, past family history, past medical history, past social history, past surgical history and problem list.   Objective:   Filed Vitals:   09/16/14 1121  BP: 138/84  Pulse: 63  Temp: 98.3 F (36.8 C)  Weight: 238 lb 1.6 oz (108.001 kg)    Fetal Status: Fetal Heart Rate (bpm): 132   Movement: Present     General:  Alert, oriented and cooperative. Patient is in no acute distress.  Skin: Skin is warm and dry. No rash noted.   Cardiovascular: Normal heart rate noted  Respiratory: Normal respiratory effort, no problems with respiration noted  Abdomen: Soft, gravid, appropriate for gestational age. Pain/Pressure: Present     Pelvic: Vag. Bleeding: None     Cervical exam deferred        Extremities: Normal range of motion.  Edema: None  Mental Status: Normal mood and affect. Normal behavior. Normal judgment and thought content.   Urinalysis: Urine Protein: 1+ Urine Glucose: Negative  Assessment and Plan:  Pregnancy: G4P3003 at [redacted]w[redacted]d  There are no diagnoses linked to this encounter. Term labor symptoms and general obstetric precautions including but not limited to vaginal bleeding, contractions, leaking of fluid and fetal movement were reviewed in detail with the patient. Please refer to After Visit Summary for other counseling recommendations.  No Follow-up on file.   Kathrynn Running, MD

## 2014-09-16 NOTE — Telephone Encounter (Signed)
Preadmission screen  

## 2014-09-20 ENCOUNTER — Inpatient Hospital Stay (HOSPITAL_COMMUNITY)
Admission: AD | Admit: 2014-09-20 | Discharge: 2014-09-22 | DRG: 774 | Disposition: A | Discharge status: Home / Self Care | Payer: Medicaid Other | Source: Ambulatory Visit | Attending: Obstetrics & Gynecology | Admitting: Obstetrics & Gynecology

## 2014-09-20 ENCOUNTER — Encounter (HOSPITAL_COMMUNITY): Payer: Self-pay

## 2014-09-20 DIAGNOSIS — O09523 Supervision of elderly multigravida, third trimester: Secondary | ICD-10-CM

## 2014-09-20 DIAGNOSIS — Z3A39 39 weeks gestation of pregnancy: Secondary | ICD-10-CM | POA: Diagnosis present

## 2014-09-20 DIAGNOSIS — O99824 Streptococcus B carrier state complicating childbirth: Secondary | ICD-10-CM | POA: Diagnosis present

## 2014-09-20 DIAGNOSIS — O321XX0 Maternal care for breech presentation, not applicable or unspecified: Secondary | ICD-10-CM | POA: Diagnosis present

## 2014-09-20 DIAGNOSIS — O1092 Unspecified pre-existing hypertension complicating childbirth: Secondary | ICD-10-CM | POA: Diagnosis present

## 2014-09-20 DIAGNOSIS — O326XX0 Maternal care for compound presentation, not applicable or unspecified: Secondary | ICD-10-CM | POA: Diagnosis present

## 2014-09-20 DIAGNOSIS — O10919 Unspecified pre-existing hypertension complicating pregnancy, unspecified trimester: Secondary | ICD-10-CM | POA: Diagnosis present

## 2014-09-20 DIAGNOSIS — O321XX1 Maternal care for breech presentation, fetus 1: Secondary | ICD-10-CM | POA: Diagnosis not present

## 2014-09-20 LAB — COMPREHENSIVE METABOLIC PANEL
ALT: 9 U/L — ABNORMAL LOW (ref 14–54)
ANION GAP: 8 (ref 5–15)
AST: 10 U/L — AB (ref 15–41)
Albumin: 2.8 g/dL — ABNORMAL LOW (ref 3.5–5.0)
Alkaline Phosphatase: 173 U/L — ABNORMAL HIGH (ref 38–126)
BUN: 8 mg/dL (ref 6–20)
CO2: 23 mmol/L (ref 22–32)
CREATININE: 0.66 mg/dL (ref 0.44–1.00)
Calcium: 9.5 mg/dL (ref 8.9–10.3)
Chloride: 105 mmol/L (ref 101–111)
GFR calc Af Amer: >60 mL/min (ref >60)
Glucose, Bld: 99 mg/dL (ref 65–99)
Potassium: 4.1 mmol/L (ref 3.5–5.1)
Sodium: 136 mmol/L (ref 135–145)
Total Bilirubin: 0.5 mg/dL (ref 0.3–1.2)
Total Protein: 6.9 g/dL (ref 6.5–8.1)

## 2014-09-20 LAB — CBC
HCT: 36.7 % (ref 36.0–46.0)
Hemoglobin: 12.1 g/dL (ref 12.0–15.0)
MCH: 27.9 pg (ref 26.0–34.0)
MCHC: 33 g/dL (ref 30.0–36.0)
MCV: 84.6 fL (ref 78.0–100.0)
Platelets: 225 10*3/uL (ref 150–400)
RBC: 4.34 MIL/uL (ref 3.87–5.11)
RDW: 13.6 % (ref 11.5–15.5)
WBC: 11.6 10*3/uL — AB (ref 4.0–10.5)

## 2014-09-20 LAB — URINALYSIS, ROUTINE W REFLEX MICROSCOPIC
Bilirubin Urine: NEGATIVE
Glucose, UA: NEGATIVE mg/dL
HGB URINE DIPSTICK: NEGATIVE
KETONES UR: NEGATIVE mg/dL
Leukocytes, UA: NEGATIVE
Nitrite: NEGATIVE
PROTEIN: NEGATIVE mg/dL
Specific Gravity, Urine: 1.025 (ref 1.005–1.030)
UROBILINOGEN UA: 1 mg/dL (ref 0.0–1.0)
pH: 6.5 (ref 5.0–8.0)

## 2014-09-20 LAB — PROTEIN / CREATININE RATIO, URINE
CREATININE, URINE: 123 mg/dL
PROTEIN CREATININE RATIO: 0.15 mg/mg{creat} (ref 0.00–0.15)
Total Protein, Urine: 19 mg/dL

## 2014-09-20 LAB — TYPE AND SCREEN
ABO/RH(D): O POS
ANTIBODY SCREEN: NEGATIVE

## 2014-09-20 LAB — ABO/RH: ABO/RH(D): O POS

## 2014-09-20 MED ORDER — OXYCODONE-ACETAMINOPHEN 5-325 MG PO TABS: 1.0000 | ORAL_TABLET | ORAL | Status: DC | PRN

## 2014-09-20 MED ORDER — TERBUTALINE SULFATE 1 MG/ML IJ SOLN
0.2500 mg | Freq: Once | INTRAMUSCULAR | Status: AC | PRN
  Administered 2014-09-20: 0.25 mg via SUBCUTANEOUS

## 2014-09-20 MED ORDER — LABETALOL HCL 200 MG PO TABS: 200.0000 mg | ORAL_TABLET | Freq: Once | ORAL | Status: DC

## 2014-09-20 MED ORDER — LACTATED RINGERS IV SOLN
500.0000 mL | INTRAVENOUS | Status: DC | PRN
  Administered 2014-09-20 – 2014-09-21 (×2): 500 mL via INTRAVENOUS

## 2014-09-20 MED ORDER — OXYTOCIN 40 UNITS IN LACTATED RINGERS INFUSION - SIMPLE MED
1.0000 m[IU]/min | INTRAVENOUS | Status: DC
  Administered 2014-09-20: 2 m[IU]/min via INTRAVENOUS

## 2014-09-20 MED ORDER — FENTANYL CITRATE (PF) 100 MCG/2ML IJ SOLN
50.0000 ug | INTRAMUSCULAR | Status: DC | PRN
  Administered 2014-09-21 (×2): 100 ug via INTRAVENOUS
  Filled 2014-09-20 (×2): qty 2

## 2014-09-20 MED ORDER — OXYCODONE-ACETAMINOPHEN 5-325 MG PO TABS: 2.0000 | ORAL_TABLET | ORAL | Status: DC | PRN

## 2014-09-20 MED ORDER — OXYTOCIN BOLUS FROM INFUSION
500.0000 mL | INTRAVENOUS | Status: DC
  Administered 2014-09-21: 500 mL via INTRAVENOUS

## 2014-09-20 MED ORDER — LABETALOL HCL 100 MG PO TABS
200.0000 mg | ORAL_TABLET | Freq: Once | ORAL | Status: AC
  Administered 2014-09-20: 200 mg via ORAL
  Filled 2014-09-20: qty 2

## 2014-09-20 MED ORDER — MISOPROSTOL 25 MCG QUARTER TABLET
25.0000 ug | ORAL_TABLET | ORAL | Status: DC | PRN
Start: 2014-09-20 — End: 2014-09-21
  Administered 2014-09-20: 25 ug via VAGINAL
  Filled 2014-09-20: qty 0.25
  Filled 2014-09-20: qty 1
  Filled 2014-09-20: qty 0.25

## 2014-09-20 MED ORDER — PENICILLIN G POTASSIUM 5000000 UNITS IJ SOLR
2.5000 10*6.[IU] | INTRAVENOUS | Status: DC
  Administered 2014-09-20 – 2014-09-21 (×5): 2.5 10*6.[IU] via INTRAVENOUS
  Filled 2014-09-20 (×8): qty 2.5

## 2014-09-20 MED ORDER — FLEET ENEMA 7-19 GM/118ML RE ENEM: 1.0000 | ENEMA | RECTAL | Status: DC | PRN

## 2014-09-20 MED ORDER — ONDANSETRON HCL 4 MG/2ML IJ SOLN: 4.0000 mg | Freq: Four times a day (QID) | INTRAMUSCULAR | Status: DC | PRN

## 2014-09-20 MED ORDER — CITRIC ACID-SODIUM CITRATE 334-500 MG/5ML PO SOLN
30.0000 mL | ORAL | Status: DC | PRN
Start: 2014-09-20 — End: 2014-09-21
  Filled 2014-09-20: qty 15

## 2014-09-20 MED ORDER — PENICILLIN G POTASSIUM 5000000 UNITS IJ SOLR
5.0000 10*6.[IU] | Freq: Once | INTRAVENOUS | Status: AC
  Administered 2014-09-20: 5 10*6.[IU] via INTRAVENOUS
  Filled 2014-09-20: qty 5

## 2014-09-20 MED ORDER — TERBUTALINE SULFATE 1 MG/ML IJ SOLN
0.2500 mg | Freq: Once | INTRAMUSCULAR | Status: AC
  Administered 2014-09-20: 0.25 mg via SUBCUTANEOUS
  Filled 2014-09-20: qty 1

## 2014-09-20 MED ORDER — LIDOCAINE HCL (PF) 1 % IJ SOLN
30.0000 mL | INTRAMUSCULAR | Status: DC | PRN
  Filled 2014-09-20: qty 30

## 2014-09-20 MED ORDER — LABETALOL HCL 200 MG PO TABS
200.0000 mg | ORAL_TABLET | Freq: Two times a day (BID) | ORAL | Status: DC
  Administered 2014-09-20 (×2): 200 mg via ORAL
  Filled 2014-09-20: qty 1
  Filled 2014-09-20: qty 2
  Filled 2014-09-20 (×2): qty 1

## 2014-09-20 MED ORDER — ZOLPIDEM TARTRATE 5 MG PO TABS: 5.0000 mg | ORAL_TABLET | Freq: Every evening | ORAL | Status: DC | PRN

## 2014-09-20 MED ORDER — OXYTOCIN 40 UNITS IN LACTATED RINGERS INFUSION - SIMPLE MED
62.5000 mL/h | INTRAVENOUS | Status: DC
  Filled 2014-09-20: qty 1000

## 2014-09-20 MED ORDER — TERBUTALINE SULFATE 1 MG/ML IJ SOLN
0.2500 mg | Freq: Once | INTRAMUSCULAR | Status: DC | PRN
  Filled 2014-09-20: qty 1

## 2014-09-20 MED ORDER — ACETAMINOPHEN 325 MG PO TABS: 650.0000 mg | ORAL_TABLET | ORAL | Status: DC | PRN

## 2014-09-20 MED ORDER — LACTATED RINGERS IV SOLN
INTRAVENOUS | Status: DC
  Administered 2014-09-20 – 2014-09-21 (×3): via INTRAVENOUS

## 2014-09-20 NOTE — Progress Notes (Signed)
After informed verbal consent, Terbutaline 0.25 mg SQ given, ECV was attempted under Ultrasound guidance.  Forward roll x 1 and baby easily converted to vertex.  FHR was reactive before and after the procedure.   Pt. Tolerated the procedure well. Will proceed with IOL with foley balloon.

## 2014-09-20 NOTE — Progress Notes (Signed)
Monitors off, Eure at bedside to perform version

## 2014-09-20 NOTE — Progress Notes (Signed)
Bedside US by Dr. Shawnie Pons breech, patient desires version.

## 2014-09-20 NOTE — Progress Notes (Signed)
Dr. Despina Hidden at bedside aware of hand presentation.

## 2014-09-20 NOTE — Progress Notes (Signed)
Dr. Shawnie Pons performed successful version.

## 2014-09-20 NOTE — Progress Notes (Signed)
Monitors off for BS [place] to confirm presentation.

## 2014-09-20 NOTE — H&P (Signed)
LABOR AND DELIVERY ADMISSION HISTORY AND PHYSICAL NOTE  Kendra Harris is a 34 y.o. female [phone] with IUP at [redacted]w[redacted]d by 23-week u/s presenting for IOL 2/2 cHTN. She reports +FMs, No LOF, no VB, no blurry vision, headaches or peripheral edema, and RUQ pain.  She plans on breast/bottle feeding. She request POPs for birth control.   Sono:  Normal 23 wk anatomy scan; 38 wk growth scan at 62nd percentile   Prenatal History/Complications:  Past Medical History: Past Medical History  Diagnosis Date  . DELAYED MENSES 02/11/2010    Qualifier: Diagnosis of  By: Wallene Huh  MD, Rande Lawman    . Urinary tract infection 03/04/2011  . Hypertension     Past Surgical History: Past Surgical History  Procedure Laterality Date  . No past surgeries      Obstetrical History: OB History    Gravida Para Term Preterm AB TAB SAB Ectopic Multiple Living   0 0 0 0 0 0 3      Social History: Social History   Social History  . Marital Status: Single    Spouse Name: N/A  . Number of Children: N/A  . Years of Education: N/A   Social History Main Topics  . Smoking status: Never Smoker   . Smokeless tobacco: Never Used  . Alcohol Use: No  . Drug Use: No  . Sexual Activity: Yes    Birth Control/ Protection: Condom   Other Topics Concern  . None   Social History Narrative    Family History: Family History  Problem Relation Age of Onset  . Sickle cell trait Sister     Allergies: No Known Allergies  Prescriptions prior to admission  Medication Sig Dispense Refill Last Dose  . aspirin EC 81 MG tablet Take 1 tablet (81 mg total) by mouth daily.   09/19/2014 at Unknown time  . labetalol (NORMODYNE) 200 MG tablet Take 1 tablet (200 mg total) by mouth 2 (two) times daily. 60 tablet 3 09/19/2014 at 8:00am  . Prenat w/o A Vit-FeFum-FePo-FA (CONCEPT OB) 130-92.4-1 MG CAPS Take 1 capsule by mouth daily. 30 capsule 3 09/20/2014 at Unknown time     Review of Systems   All systems reviewed and  negative except as stated in HPI  Blood pressure 141/101, pulse 60, temperature 98.6 F (37 C), temperature source Oral, resp. rate 16, height  (1.575 m), weight 230 lb (104.327 kg), last menstrual period 01/14/2014. General appearance: alert, cooperative and appears stated age Lungs: clear to auscultation bilaterally Heart: regular rate and rhythm Abdomen: soft, non-tender; bowel sounds normal Pelvic: fingertip/thick/high Extremities: Homans sign is negative, no sign of DVT DTR's 1+ Presentation: breech Fetal monitoring: 130/mod/+a/-3 Uterine activity: irregular/infrequent  Dilation: Fingertip Effacement (%): Thick Exam by:: Dr. Work   Prenatal labs: ABO, Rh: O/POS/-- (04/14 1502) Antibody: NEG (04/14 1502) Rubella:  immune RPR: NON REAC (04/14 1502)  HBsAg: NEGATIVE (04/14 1502)  HIV: NONREACTIVE (04/14 1502)  GBS: Positive (07/25 0000)  1 hr Glucola 159, normal 3-hour Genetics: quad normal  Prenatal Transfer Tool  Maternal Diabetes: No Genetic Screening: Normal quad Maternal Ultrasounds/Referrals: Normal Fetal Ultrasounds or other Referrals:  None Maternal Substance Abuse:  No Significant Maternal Medications:  Labetalol for cHTN Significant Maternal Lab Results: Lab values include: Group B Strep positive  Results for orders placed or performed during the hospital encounter of 09/20/14 (from the past 24 hour(s))  CBC   Collection Time: 09/20/14  9:42 AM  Result Value Ref Range  WBC 11.6 (H) 4.0 - 10.5 K/uL   RBC 4.34 3.87 - 5.11 MIL/uL   Hemoglobin 12.1 12.0 - 15.0 g/dL   HCT 40.9 81.1 - 91.4 %   MCV 84.6 78.0 - 100.0 fL   MCH 27.9 26.0 - 34.0 pg   MCHC 33.0 30.0 - 36.0 g/dL   RDW 78.2 95.6 - 21.3 %   Platelets 225 150 - 400 K/uL    Patient Active Problem List   Diagnosis Date Noted  . Transverse fetal lie 09/10/2014  . Group B Streptococcus carrier, +RV culture, currently pregnant 09/05/2014  . Dental abscess 07/31/2014  . Obesity in pregnancy,  antepartum   . Substance abuse affecting pregnancy, antepartum 05/30/2014  . Chronic hypertension in pregnancy 05/23/2014  . Advanced maternal age in multigravida 05/23/2014  . Supervision of high-risk pregnancy 04/10/2014  . Lymphadenopathy of left cervical region 06/29/2013  . Menorrhagia 04/27/2013  . Severe major depression with psychotic features 07/24/2010  . DIABETES MELLITUS, GESTATIONAL, HX OF 02/13/2009  . MIGRAINE HEADACHE 12/13/2007  . GERD 11/09/2007  . Essential hypertension, benign 08/31/2007  . Morbid obesity with BMI of 45.0-49.9, adult 04/07/2006    Assessment: Kendra Harris is a 33 y.o. G4P3003 at [redacted]w[redacted]d here for IOL 2/2 cHTN. Breech on presentation now s/p successful version (one attempt).   #Labor: admit, will attempt to place foley bulb and will place cytotec. # cHTN: continue labetalol 200 mg po bid while in latent labor. BPs currently mild/moderate. No symptoms preeclampsia #Pain: Likely eventual epidural #FWB: Cat 1 #ID:  gbs positive, pcn when active #MOF: bottle #MOC: POPs #Circ:  undecided  Silvano Bilis 09/20/2014, 10:11 AM

## 2014-09-20 NOTE — Progress Notes (Signed)
Dr. Despina Hidden attempting version at bedside.

## 2014-09-21 ENCOUNTER — Encounter (HOSPITAL_COMMUNITY): Payer: Self-pay

## 2014-09-21 DIAGNOSIS — O99824 Streptococcus B carrier state complicating childbirth: Secondary | ICD-10-CM | POA: Diagnosis not present

## 2014-09-21 DIAGNOSIS — O1092 Unspecified pre-existing hypertension complicating childbirth: Secondary | ICD-10-CM | POA: Diagnosis not present

## 2014-09-21 DIAGNOSIS — Z3A39 39 weeks gestation of pregnancy: Secondary | ICD-10-CM | POA: Diagnosis not present

## 2014-09-21 DIAGNOSIS — O09523 Supervision of elderly multigravida, third trimester: Secondary | ICD-10-CM | POA: Diagnosis not present

## 2014-09-21 LAB — RPR: RPR Ser Ql: NONREACTIVE

## 2014-09-21 MED ORDER — PRENATAL MULTIVITAMIN CH
1.0000 | ORAL_TABLET | Freq: Every day | ORAL | Status: DC
  Administered 2014-09-21 – 2014-09-22 (×2): 1 via ORAL
  Filled 2014-09-21 (×2): qty 1

## 2014-09-21 MED ORDER — ONDANSETRON HCL 4 MG PO TABS: 4.0000 mg | ORAL_TABLET | ORAL | Status: DC | PRN

## 2014-09-21 MED ORDER — DIBUCAINE 1 % RE OINT: 1.0000 "application " | TOPICAL_OINTMENT | RECTAL | Status: DC | PRN

## 2014-09-21 MED ORDER — WITCH HAZEL-GLYCERIN EX PADS: 1.0000 "application " | MEDICATED_PAD | CUTANEOUS | Status: DC | PRN

## 2014-09-21 MED ORDER — TETANUS-DIPHTH-ACELL PERTUSSIS 5-2.5-18.5 LF-MCG/0.5 IM SUSP: 0.5000 mL | Freq: Once | INTRAMUSCULAR | Status: DC

## 2014-09-21 MED ORDER — MEASLES, MUMPS & RUBELLA VAC ~~LOC~~ INJ
0.5000 mL | INJECTION | Freq: Once | SUBCUTANEOUS | Status: DC
Start: 2014-09-22 — End: 2014-09-22
  Filled 2014-09-21: qty 0.5

## 2014-09-21 MED ORDER — BISACODYL 10 MG RE SUPP: 10.0000 mg | Freq: Every day | RECTAL | Status: DC | PRN

## 2014-09-21 MED ORDER — IBUPROFEN 600 MG PO TABS
600.0000 mg | ORAL_TABLET | Freq: Four times a day (QID) | ORAL | Status: DC
  Administered 2014-09-21 – 2014-09-22 (×5): 600 mg via ORAL
  Filled 2014-09-21 (×6): qty 1

## 2014-09-21 MED ORDER — PROPRANOLOL HCL 40 MG PO TABS
40.0000 mg | ORAL_TABLET | Freq: Two times a day (BID) | ORAL | Status: DC
  Administered 2014-09-21 – 2014-09-22 (×3): 40 mg via ORAL
  Filled 2014-09-21 (×5): qty 1

## 2014-09-21 MED ORDER — SODIUM CHLORIDE 0.9 % IJ SOLN: 3.0000 mL | INTRAMUSCULAR | Status: DC | PRN

## 2014-09-21 MED ORDER — SIMETHICONE 80 MG PO CHEW: 80.0000 mg | CHEWABLE_TABLET | ORAL | Status: DC | PRN

## 2014-09-21 MED ORDER — SODIUM CHLORIDE 0.9 % IV SOLN: 250.0000 mL | INTRAVENOUS | Status: DC | PRN

## 2014-09-21 MED ORDER — ACETAMINOPHEN 325 MG PO TABS: 650.0000 mg | ORAL_TABLET | ORAL | Status: DC | PRN

## 2014-09-21 MED ORDER — OXYCODONE-ACETAMINOPHEN 5-325 MG PO TABS: 2.0000 | ORAL_TABLET | ORAL | Status: DC | PRN

## 2014-09-21 MED ORDER — LANOLIN HYDROUS EX OINT: TOPICAL_OINTMENT | CUTANEOUS | Status: DC | PRN

## 2014-09-21 MED ORDER — OXYCODONE-ACETAMINOPHEN 5-325 MG PO TABS: 1.0000 | ORAL_TABLET | ORAL | Status: DC | PRN

## 2014-09-21 MED ORDER — SODIUM CHLORIDE 0.9 % IJ SOLN: 3.0000 mL | Freq: Two times a day (BID) | INTRAMUSCULAR | Status: DC

## 2014-09-21 MED ORDER — FLEET ENEMA 7-19 GM/118ML RE ENEM: 1.0000 | ENEMA | Freq: Every day | RECTAL | Status: DC | PRN

## 2014-09-21 MED ORDER — OXYTOCIN 40 UNITS IN LACTATED RINGERS INFUSION - SIMPLE MED: 62.5000 mL/h | INTRAVENOUS | Status: DC | PRN

## 2014-09-21 MED ORDER — SENNOSIDES-DOCUSATE SODIUM 8.6-50 MG PO TABS
2.0000 | ORAL_TABLET | ORAL | Status: DC
  Administered 2014-09-21: 2 via ORAL
  Filled 2014-09-21: qty 2

## 2014-09-21 MED ORDER — ONDANSETRON HCL 4 MG/2ML IJ SOLN
4.0000 mg | INTRAMUSCULAR | Status: DC | PRN
Start: 2014-09-21 — End: 2014-09-22

## 2014-09-21 MED ORDER — BENZOCAINE-MENTHOL 20-0.5 % EX AERO: 1.0000 "application " | INHALATION_SPRAY | CUTANEOUS | Status: DC | PRN

## 2014-09-21 MED ORDER — DIPHENHYDRAMINE HCL 25 MG PO CAPS: 25.0000 mg | ORAL_CAPSULE | Freq: Four times a day (QID) | ORAL | Status: DC | PRN

## 2014-09-21 MED ORDER — ZOLPIDEM TARTRATE 5 MG PO TABS: 5.0000 mg | ORAL_TABLET | Freq: Every evening | ORAL | Status: DC | PRN

## 2014-09-21 NOTE — Progress Notes (Signed)
Upon returning from restroom, suspicious for head at fundus by [place].

## 2014-09-21 NOTE — Progress Notes (Signed)
Patient felt like baby was going to "pop out" after coming back from bathroom.

## 2014-09-22 MED ORDER — SENNOSIDES-DOCUSATE SODIUM 8.6-50 MG PO TABS: 2.0000 | ORAL_TABLET | ORAL | Status: DC

## 2014-09-22 MED ORDER — IBUPROFEN 600 MG PO TABS: 600.0000 mg | ORAL_TABLET | Freq: Four times a day (QID) | ORAL | Status: DC

## 2014-09-22 MED ORDER — PROPRANOLOL HCL 40 MG PO TABS: 40.0000 mg | ORAL_TABLET | Freq: Two times a day (BID) | ORAL | Status: DC

## 2014-09-22 NOTE — Discharge Instructions (Signed)

## 2014-09-22 NOTE — Discharge Summary (Signed)
Obstetric Discharge Summary Reason for Admission: induction of labor Prenatal Procedures: none Intrapartum Procedures: spontaneous vaginal delivery Postpartum Procedures: none Complications-Operative and Postpartum: none  Called for delivery, walked in and baby's head was out w/ compound Lt hand/arm, and at 8:34 AM the remainder of the body was delivered by me. viable female was delivered via (Presentation: LOA w/ compound Lt hand/arm and cord wrapped around Rt arm). APGAR: 8, 10; weight: pending at time of note. Infant placed directly on mom's abdomen for bonding/skin-to-skin. Delayed cord clamping and then cord was clamped x 2, and cut by family member.  Placenta status: Intact, Spontaneous. Cord: with the following complications: none. Cord pH: h/a  Hospital Course:  Active Problems:   Chronic hypertension during pregnancy, antepartum   Kendra Harris is a 34 y.o. [id number] s/p SVD.  Patient was admitted 8/12 for IOL 2/2 cHTN.  She has postpartum course that was uncomplicated including no problems with ambulating, PO intake, urination, pain, or bleeding. The pt feels ready to go home and  will be discharged with outpatient follow-up.   Today: No acute events overnight.  Pt denies problems with ambulating, voiding or po intake.  She denies nausea or vomiting.  Pain is well controlled.  She has had flatus. She has not had bowel movement.  Lochia Minimal.  Plan for birth control is  bilateral tubal ligation.  Method of Feeding: Bottle  Physical Exam:  General: alert and cooperative Lochia: appropriate Uterine Fundus: firm DVT Evaluation: No evidence of DVT seen on physical exam. 1+ pedal edema bilaterally   H/H: Lab Results  Component Value Date/Time   HGB 12.1 09/20/2014 09:42 AM   HCT 36.7 09/20/2014 09:42 AM    Discharge Diagnoses: Term Pregnancy-delivered  Discharge Information: Date: 09/22/2014 Activity: pelvic rest Diet: routine  Medications: Ibuprofen and  Colace, Propanolol  Breast feeding:  No: Bottle & Breast  Condition: stable Instructions: refer to handout Discharge to: home BTL papers signed 8/8---will be done as interval BTL Lactation consult prior to discharge  Have switched patient from labetalol 200 mg bid to her pre-pregnancy dose of propanolol 40 mg bid. BP have been well controlled PP.      Medication List    TAKE these medications        aspirin EC 81 MG tablet  Take 1 tablet (81 mg total) by mouth daily.     CONCEPT OB 130-92.4-1 MG Caps  Take 1 capsule by mouth daily.     ibuprofen 600 MG tablet  Commonly known as:  ADVIL,MOTRIN  Take 1 tablet (600 mg total) by mouth every 6 (six) hours.     labetalol 200 MG tablet  Commonly known as:  NORMODYNE  Take 1 tablet (200 mg total) by mouth 2 (two) times daily.     senna-docusate 8.6-50 MG per tablet  Commonly known as:  Senokot-S  Take 2 tablets by mouth daily.           Follow-up Information    Follow up with [facility]. Schedule an appointment as soon as possible for a visit in 6 weeks.   Specialty:  Obstetrics and Gynecology   Why:  For postpartum visit   Contact information:   [address] [place] [address] [phone]      De Hollingshead  09/22/2014,7:43 AM  I spoke with and examined patient and agree with resident/PA/SNM's note and plan of care.  Eating, drinking, voiding, ambulating well.  +flatus.  Lochia and pain wnl.  Denies dizziness, lightheadedness, or sob. No complaints. Bottlefeeding, interval BTL, no circumcision. Continue propranolol 40mg  bid for CHTN- stop labetalol.   Cheral Marker, CNM, [facility] 09/22/2014 8:44 AM

## 2014-09-22 NOTE — Progress Notes (Signed)
CLINICAL SOCIAL WORK MATERNAL/CHILD NOTE  Patient Details  Name: Kendra Harris MRN: [id number] Date of Birth: 09/21/2015  Date: 09/22/2014  Clinical Social Worker Initiating Note: Nabeeha Badertscher, LCSWDate/ Time Initiated: 09/22/14/0930   Child's Name: Kendra Harris   Legal Guardian:  (Parents Kendra Harris and Kendra Harris)   Need for Interpreter: None   Date of Referral: 09/21/14   Reason for Referral: Other (Comment)   Referral Source: [facility]   Address: [address] [place], [place] [address]  Phone number:  [phone])   Household Members: Parents, Minor Children   Natural Supports (not living in the home): Extended Family, Immediate Family   Professional Supports: ([facility])   Employment: (FOB is employed)   Type of Work:     Education:     Surveyor, quantity Resources:Medicaid   Other Resources: [employer]   Cultural/Religious Considerations Which May Impact Care: none noted  Strengths: Ability to meet basic needs , Compliance with medical plan , Home prepared for child    Risk Factors/Current Problems:  (Hx of mental illness)   Cognitive State: Alert , Able to Concentrate    Mood/Affect: Happy , Relaxed    CSW Assessment: Acknowledged order for Social Work consult to assess mother's history of depression. MOB was pleasant and receptive to CSW. Parents are not married, but have been in a relationship for the past 19 years. MOB has 3 other dependents ages 42,9, and 68. Mother acknowledged hx of depression and states that she was hospitalized in a psychiatric facility in 2006 for about 2 months. Informed that she is currently a patient of [facility] and was on medication prior to pregnancy. MOB states that she stop taking the medication because of the pregnancy and plans to call on Monday to schedule an appointment to resume medication. She reports no current symptoms of  depression or anxiety or since stopping the medication. She denies any hx of substance abuse. No acute social concerns related at this time. Mother informed of social work Surveyor, mining.   CSW Plan/Description:    Spoke with her about the signs/symptoms of PP Depression No current barriers to discharge    Laura-Lee Villegas J, LCSW 09/22/2014, 3:09 PM

## 2014-09-25 ENCOUNTER — Ambulatory Visit: Payer: Medicaid Other | Admitting: Obstetrics and Gynecology

## 2014-10-28 ENCOUNTER — Ambulatory Visit: Payer: Medicaid Other | Admitting: Advanced Practice Midwife

## 2014-12-24 ENCOUNTER — Ambulatory Visit: Payer: Medicaid Other

## 2015-01-15 ENCOUNTER — Ambulatory Visit: Payer: Medicaid Other | Admitting: Student

## 2015-02-09 NOTE — L&D Delivery Note (Signed)
Delivery Note At 9:32 PM a viable female was delivered via Vaginal, Spontaneous Delivery (Presentation: OA   ). Called urgently to room. Upon arrival head had delivered and body was following. Baby delivered to mothers abdomen.  APGAR: 9, 9; Placenta status: delivered with gentle traction.  Cord: three vessel with the following complications: none .   Anesthesia:  none Episiotomy: None Lacerations: 1st degree Suture Repair: none Est. Blood Loss (mL): 50  Mom to postpartum.  Baby to Couplet care / Skin to Skin.  Kendra Pennaicholas Talulah Harris 11/03/2015, 9:43 PM

## 2015-07-11 ENCOUNTER — Other Ambulatory Visit: Payer: Self-pay | Admitting: Family Medicine

## 2015-07-22 ENCOUNTER — Encounter: Payer: Self-pay | Admitting: Family Medicine

## 2015-07-22 ENCOUNTER — Ambulatory Visit (INDEPENDENT_AMBULATORY_CARE_PROVIDER_SITE_OTHER): Payer: Medicaid Other | Admitting: Family Medicine

## 2015-07-22 VITALS — BP 152/80 | HR 72 | Temp 98.2°F | Wt 238.8 lb

## 2015-07-22 DIAGNOSIS — N912 Amenorrhea, unspecified: Secondary | ICD-10-CM | POA: Diagnosis present

## 2015-07-22 DIAGNOSIS — Z331 Pregnant state, incidental: Secondary | ICD-10-CM

## 2015-07-22 LAB — POCT URINE PREGNANCY: PREG TEST UR: POSITIVE — AB

## 2015-07-22 MED ORDER — CONCEPT OB 130-92.4-1 MG PO CAPS: 1.0000 | ORAL_CAPSULE | Freq: Every day | ORAL | Status: DC

## 2015-07-22 MED ORDER — LABETALOL HCL 200 MG PO TABS: 200.0000 mg | ORAL_TABLET | Freq: Two times a day (BID) | ORAL | Status: DC

## 2015-07-22 NOTE — Progress Notes (Signed)
   Subjective:   Kendra Harris is a 35 y.o. female with a history of HTN, gestational diabetes, morbid obesity, MDD with psychotic features here for same day appointment for back and side pain.  Patient reports she has had similar cramping low back pain that goes into the side of her pelvis with all previous pregnancies. Is been going on for about 1 week this time, so she became concerned that she may be pregnant. She denies any vaginal bleeding, contractions, loss of fluid. She reports she has been feeling fetal movement and kicks. She is sexually active with one female partner and only uses condoms. She is not in any contraception since her last baby which was born and 8/16. She has had 4 previous pregnancies and 4 term spontaneous vaginal deliveries. She last had any vaginal bleeding only for 2 days about 3 months ago. She reports her periods have been irregular for at least last 2 years. [facility] due to gestational diabetes and chronic hypertension.  She denies any constipation, dysuria, urinary frequency, abdominal pain, pelvic pain, fevers.  Only medication she is taking his labetalol. She is not taking prenatal vitamins. She denies any alcohol or tobacco use.  Review of Systems:  Per HPI.   Social History: Never smoker  Objective:  BP 152/80 mmHg  Pulse 72  Temp(Src) 98.2 F (36.8 C) (Oral)  Wt 238 lb 12.8 oz (108.319 kg)  Gen:  35 y.o. female in NAD CV: RRR, no MRG Resp: Non-labored, CTAB, no wheezes noted Abd: Soft, gravid, fundus of uterus 28cm from pubic symphysis, BS present, no guarding or organomegaly Ext: WWP, no edema MSK: Back: no TTP, Full ROM, strength intact in LEs, no CVAT Neuro: Alert and oriented, speech normal  Fetal heart toes found easily and measured at 141     Assessment & Plan:     Kendra Harris is a 35 y.o. female here for   Incidental pregnancy confirmed Urine  pregnancy test positive in clinic Fetal heart tones detected easily Fundus of uterus 28 cm from. Symphysis Patient is only concerned about back pain because she thought she may be pregnant Suspect back pain is related to musculoskeletal pains of pregnancy No red flags including vaginal bleeding, contractions, loss of fluid, decreased fetal movement Schedule anatomy and dating ultrasound as soon as possible Patient to make appointment with high risk OB clinic Start prenatal vitamins Advised to avoid tobacco and alcohol and NSAIDs    Erasmo DownerAngela M Javohn Basey, MD MPH PGY-2,  [facility] 07/22/2015  4:45 PM

## 2015-07-22 NOTE — Assessment & Plan Note (Signed)
Urine pregnancy test positive in clinic Fetal heart tones detected easily Fundus of uterus 28 cm from. Symphysis Patient is only concerned about back pain because she thought she may be pregnant Suspect back pain is related to musculoskeletal pains of pregnancy No red flags including vaginal bleeding, contractions, loss of fluid, decreased fetal movement Schedule anatomy and dating ultrasound as soon as possible Patient to make appointment with high risk OB clinic Start prenatal vitamins Advised to avoid tobacco and alcohol and NSAIDs

## 2015-07-22 NOTE — Patient Instructions (Addendum)
Start prenatal vitamins daily No smoking or drinking [facility] Get ultrasound as scheduled  Second Trimester of Pregnancy The second trimester is from week 13 through week 28, months 4 through 6. The second trimester is often a time when you feel your best. Your body has also adjusted to being pregnant, and you begin to feel better physically. Usually, morning sickness has lessened or quit completely, you may have more energy, and you may have an increase in appetite. The second trimester is also a time when the fetus is growing rapidly. At the end of the sixth month, the fetus is about 9 inches long and weighs about 1 pounds. You will likely begin to feel the baby move (quickening) between 18 and 20 weeks of the pregnancy. BODY CHANGES Your body goes through many changes during pregnancy. The changes vary from woman to woman.   Your weight will continue to increase. You will notice your lower abdomen bulging out.  You may begin to get stretch marks on your hips, abdomen, and breasts.  You may develop headaches that can be relieved by medicines approved by your health care provider.  You may urinate more often because the fetus is pressing on your bladder.  You may develop or continue to have heartburn as a result of your pregnancy.  You may develop constipation because certain hormones are causing the muscles that push waste through your intestines to slow down.  You may develop hemorrhoids or swollen, bulging veins (varicose veins).  You may have back pain because of the weight gain and pregnancy hormones relaxing your joints between the bones in your pelvis and as a result of a shift in weight and the muscles that support your balance.  Your breasts will continue to grow and be tender.  Your gums may bleed and may be sensitive to brushing and flossing.  Dark spots or blotches (chloasma, mask of pregnancy) may develop on your face.  This will likely fade after the baby is born.  A dark line from your belly button to the pubic area (linea nigra) may appear. This will likely fade after the baby is born.  You may have changes in your hair. These can include thickening of your hair, rapid growth, and changes in texture. Some women also have hair loss during or after pregnancy, or hair that feels dry or thin. Your hair will most likely return to normal after your baby is born. WHAT TO EXPECT AT YOUR PRENATAL VISITS During a routine prenatal visit:  You will be weighed to make sure you and the fetus are growing normally.  Your blood pressure will be taken.  Your abdomen will be measured to track your baby's growth.  The fetal heartbeat will be listened to.  Any test results from the previous visit will be discussed. Your health care provider may ask you:  How you are feeling.  If you are feeling the baby move.  If you have had any abnormal symptoms, such as leaking fluid, bleeding, severe headaches, or abdominal cramping.  If you are using any tobacco products, including cigarettes, chewing tobacco, and electronic cigarettes.  If you have any questions. Other tests that may be performed during your second trimester include:  Blood tests that check for:  Low iron levels (anemia).  Gestational diabetes (between 24 and 28 weeks).  Rh antibodies.  Urine tests to check for infections, diabetes, or protein in the urine.  An ultrasound to confirm the proper  growth and development of the baby.  An amniocentesis to check for possible genetic problems.  Fetal screens for spina bifida and Down syndrome.  HIV (human immunodeficiency virus) testing. Routine prenatal testing includes screening for HIV, unless you choose not to have this test. HOME CARE INSTRUCTIONS   Avoid all smoking, herbs, alcohol, and unprescribed drugs. These chemicals affect the formation and growth of the baby.  Do not use any tobacco  products, including cigarettes, chewing tobacco, and electronic cigarettes. If you need help quitting, ask your health care provider. You may receive counseling support and other resources to help you quit.  Follow your health care provider's instructions regarding medicine use. There are medicines that are either safe or unsafe to take during pregnancy.  Exercise only as directed by your health care provider. Experiencing uterine cramps is a good sign to stop exercising.  Continue to eat regular, healthy meals.  Wear a good support bra for breast tenderness.  Do not use hot tubs, steam rooms, or saunas.  Wear your seat belt at all times when driving.  Avoid raw meat, uncooked cheese, cat litter boxes, and soil used by cats. These carry germs that can cause birth defects in the baby.  Take your prenatal vitamins.  Take 1500-2000 mg of calcium daily starting at the 20th week of pregnancy until you deliver your baby.  Try taking a stool softener (if your health care provider approves) if you develop constipation. Eat more high-fiber foods, such as fresh vegetables or fruit and whole grains. Drink plenty of fluids to keep your urine clear or pale yellow.  Take warm sitz baths to soothe any pain or discomfort caused by hemorrhoids. Use hemorrhoid cream if your health care provider approves.  If you develop varicose veins, wear support hose. Elevate your feet for 15 minutes, 3-4 times a day. Limit salt in your diet.  Avoid heavy lifting, wear low heel shoes, and practice good posture.  Rest with your legs elevated if you have leg cramps or low back pain.  Visit your dentist if you have not gone yet during your pregnancy. Use a soft toothbrush to brush your teeth and be gentle when you floss.  A sexual relationship may be continued unless your health care provider directs you otherwise.  Continue to go to all your prenatal visits as directed by your health care provider. SEEK MEDICAL  CARE IF:   You have dizziness.  You have mild pelvic cramps, pelvic pressure, or nagging pain in the abdominal area.  You have persistent nausea, vomiting, or diarrhea.  You have a bad smelling vaginal discharge.  You have pain with urination. SEEK IMMEDIATE MEDICAL CARE IF:   You have a fever.  You are leaking fluid from your vagina.  You have spotting or bleeding from your vagina.  You have severe abdominal cramping or pain.  You have rapid weight gain or loss.  You have shortness of breath with chest pain.  You notice sudden or extreme swelling of your face, hands, ankles, feet, or legs.  You have not felt your baby move in over an hour.  You have severe headaches that do not go away with medicine.  You have vision changes.   This information is not intended to replace advice given to you by your health care provider. Make sure you discuss any questions you have with your health care provider.   Document Released: 01/19/2001 Document Revised: 02/15/2014 Document Reviewed: 03/28/2012 Elsevier Interactive Patient Education [employer]2016 [employer] Inc.

## 2015-08-01 ENCOUNTER — Encounter (HOSPITAL_COMMUNITY): Payer: Self-pay

## 2015-08-04 ENCOUNTER — Ambulatory Visit (HOSPITAL_COMMUNITY)
Admission: RE | Admit: 2015-08-04 | Discharge: 2015-08-04 | Disposition: A | Discharge status: Home / Self Care | Payer: Medicaid Other | Source: Ambulatory Visit | Attending: Family Medicine | Admitting: Family Medicine

## 2015-08-04 ENCOUNTER — Encounter (HOSPITAL_COMMUNITY): Payer: Self-pay

## 2015-08-04 ENCOUNTER — Other Ambulatory Visit: Payer: Self-pay | Admitting: Family Medicine

## 2015-08-04 DIAGNOSIS — O10012 Pre-existing essential hypertension complicating pregnancy, second trimester: Secondary | ICD-10-CM | POA: Diagnosis not present

## 2015-08-04 DIAGNOSIS — Z331 Pregnant state, incidental: Secondary | ICD-10-CM

## 2015-08-04 DIAGNOSIS — Z8632 Personal history of gestational diabetes: Secondary | ICD-10-CM

## 2015-08-04 DIAGNOSIS — Z3689 Encounter for other specified antenatal screening: Secondary | ICD-10-CM

## 2015-08-04 DIAGNOSIS — O09292 Supervision of pregnancy with other poor reproductive or obstetric history, second trimester: Secondary | ICD-10-CM | POA: Insufficient documentation

## 2015-08-04 DIAGNOSIS — O99212 Obesity complicating pregnancy, second trimester: Secondary | ICD-10-CM | POA: Insufficient documentation

## 2015-08-04 DIAGNOSIS — Z36 Encounter for antenatal screening of mother: Secondary | ICD-10-CM | POA: Insufficient documentation

## 2015-08-04 DIAGNOSIS — O10019 Pre-existing essential hypertension complicating pregnancy, unspecified trimester: Secondary | ICD-10-CM

## 2015-08-04 DIAGNOSIS — Z3A26 26 weeks gestation of pregnancy: Secondary | ICD-10-CM

## 2015-08-04 DIAGNOSIS — O09522 Supervision of elderly multigravida, second trimester: Secondary | ICD-10-CM | POA: Insufficient documentation

## 2015-08-06 ENCOUNTER — Encounter: Payer: Self-pay | Admitting: Obstetrics and Gynecology

## 2015-08-25 ENCOUNTER — Encounter: Payer: Self-pay | Admitting: Obstetrics and Gynecology

## 2015-08-25 ENCOUNTER — Encounter: Payer: Medicaid Other | Admitting: Obstetrics and Gynecology

## 2015-09-17 ENCOUNTER — Encounter: Payer: Medicaid Other | Admitting: Family

## 2015-10-22 ENCOUNTER — Encounter: Payer: Self-pay | Admitting: Obstetrics & Gynecology

## 2015-10-22 ENCOUNTER — Other Ambulatory Visit: Payer: Self-pay | Admitting: Obstetrics & Gynecology

## 2015-10-22 ENCOUNTER — Ambulatory Visit (INDEPENDENT_AMBULATORY_CARE_PROVIDER_SITE_OTHER): Payer: Medicaid Other | Admitting: Obstetrics & Gynecology

## 2015-10-22 DIAGNOSIS — O0993 Supervision of high risk pregnancy, unspecified, third trimester: Secondary | ICD-10-CM

## 2015-10-22 DIAGNOSIS — O10913 Unspecified pre-existing hypertension complicating pregnancy, third trimester: Secondary | ICD-10-CM

## 2015-10-22 LAB — POCT URINALYSIS DIP (DEVICE)
Glucose, UA: NEGATIVE mg/dL
HGB URINE DIPSTICK: NEGATIVE
LEUKOCYTES UA: NEGATIVE
NITRITE: NEGATIVE
PH: 6 (ref 5.0–8.0)
Protein, ur: 30 mg/dL — AB
Specific Gravity, Urine: >=1.03 (ref 1.005–1.030)
Urobilinogen, UA: 1 mg/dL (ref 0.0–1.0)

## 2015-10-22 NOTE — Progress Notes (Signed)
   PRENATAL VISIT NOTE  Subjective:  Kendra Harris is a 35 y.o. [id number] at 7524w2d being seen today for ongoing prenatal care.  She is currently monitored for the following issues for this high-risk pregnancy and has Morbid obesity with BMI of 45.0-49.9, adult (HCC); MIGRAINE HEADACHE; Essential hypertension, benign; GERD; DIABETES MELLITUS, GESTATIONAL, HX OF; Severe major depression with psychotic features (HCC); Menorrhagia; Lymphadenopathy of left cervical region; Supervision of high-risk pregnancy; Chronic hypertension in pregnancy; Advanced maternal age in multigravida; Substance abuse affecting pregnancy, antepartum; Obesity in pregnancy, antepartum; Dental abscess; Group B Streptococcus carrier, +RV culture, currently pregnant; Chronic hypertension during pregnancy, antepartum; and Incidental pregnancy confirmed on her problem list.  Patient reports no complaints.  Contractions: Not present. Vag. Bleeding: None.  Movement: Present. Denies leaking of fluid.   The following portions of the patient's history were reviewed and updated as appropriate: allergies, current medications, past family history, past medical history, past social history, past surgical history and problem list. Problem list updated.  Objective:   Vitals:   10/22/15 1529  BP: (!) 140/92  Pulse: 98  Weight: 250 lb 8 oz (113.6 kg)    Fetal Status: Fetal Heart Rate (bpm): 154   Movement: Present     General:  Alert, oriented and cooperative. Patient is in no acute distress.  Skin: Skin is warm and dry. No rash noted.   Cardiovascular: Normal heart rate noted  Respiratory: Normal respiratory effort, no problems with respiration noted  Abdomen: Soft, gravid, appropriate for gestational age. Pain/Pressure: Present     Pelvic:  Cervical exam performed        Extremities: Normal range of motion.  Edema: Trace  Mental Status: Normal mood and affect. Normal behavior. Normal judgment and thought content.   Urinalysis:       Assessment and Plan:  Pregnancy: G5P4004 at 4524w2d  1. Pregnancy, supervision, high-risk, third trimester - US MFM OB FOLLOW UP; Future - Culture, OB Urine - Prenatal Profile - Hemoglobinopathy evaluation - Pain Mgmt, Profile 6 Conf w/o mM, U - Culture, beta strep (group b only) - Glucose Tolerance, 1 HR (50g) w/o Fasting  2. Chronic hypertension in pregnancy, third trimester - Protein / creatinine ratio, urine - TSH -Start twice weekly testing (first appt tomorrow).  Will check BP again.   -US for growth  3. Contraception -BTL papers  patient aware that she will have to get it several weeks postpartum due to late prenatal care  4.  Obesity We'll screen for gestational diabetes. 1220 pound weight gain total for the pregnancy.  5.  ID -GBS today; we'll get GC and chlamydia on a nonclearing catch urine next week.  Term labor symptoms and general obstetric precautions including but not limited to vaginal bleeding, contractions, leaking of fluid and fetal movement were reviewed in detail with the patient. Please refer to After Visit Summary for other counseling recommendations.  Return in about 1 week (around 10/29/2015).   PT LEFT AMA BEFORE LABS DRAWN  Lesly DukesKelly H Aidynn Krenn, MD

## 2015-10-22 NOTE — Progress Notes (Signed)
New ob/28 wk packet given  Declined flu/tdap vaccine  Pt left without getting her labs drawn.  LM x 2 for pt to come in to redo her labs asap.

## 2015-10-24 ENCOUNTER — Ambulatory Visit (HOSPITAL_COMMUNITY): Admission: RE | Admit: 2015-10-24 | Payer: Medicaid Other | Source: Ambulatory Visit

## 2015-10-25 LAB — CULTURE, BETA STREP (GROUP B ONLY)

## 2015-10-27 ENCOUNTER — Telehealth: Payer: Self-pay | Admitting: *Deleted

## 2015-10-27 NOTE — Telephone Encounter (Addendum)
Called pt and left message requesting her to call back regarding appt needed this week. Pt needs twice weekly fetal testing due to Chronic HTN requiring medication control. She also needs to be scheduled for IOL @ [redacted] wks [facility]   [facility]  Pt presented for appt as scheduled. She was informed of plan of care (US tomorrow and IOL on 9/25) and voiced understanding.

## 2015-10-28 ENCOUNTER — Telehealth (HOSPITAL_COMMUNITY): Payer: Self-pay | Admitting: *Deleted

## 2015-10-28 NOTE — Telephone Encounter (Signed)
Preadmission screen  

## 2015-10-29 ENCOUNTER — Ambulatory Visit (INDEPENDENT_AMBULATORY_CARE_PROVIDER_SITE_OTHER): Payer: Medicaid Other | Admitting: Obstetrics and Gynecology

## 2015-10-29 ENCOUNTER — Other Ambulatory Visit (HOSPITAL_COMMUNITY)
Admission: RE | Admit: 2015-10-29 | Discharge: 2015-10-29 | Disposition: A | Discharge status: Home / Self Care | Payer: Medicaid Other | Source: Ambulatory Visit | Attending: Obstetrics and Gynecology | Admitting: Obstetrics and Gynecology

## 2015-10-29 VITALS — BP 120/82 | HR 73 | Wt 250.3 lb

## 2015-10-29 DIAGNOSIS — O10913 Unspecified pre-existing hypertension complicating pregnancy, third trimester: Secondary | ICD-10-CM

## 2015-10-29 DIAGNOSIS — O99213 Obesity complicating pregnancy, third trimester: Secondary | ICD-10-CM | POA: Diagnosis not present

## 2015-10-29 DIAGNOSIS — O09523 Supervision of elderly multigravida, third trimester: Secondary | ICD-10-CM

## 2015-10-29 DIAGNOSIS — E669 Obesity, unspecified: Secondary | ICD-10-CM | POA: Diagnosis not present

## 2015-10-29 DIAGNOSIS — O0993 Supervision of high risk pregnancy, unspecified, third trimester: Secondary | ICD-10-CM

## 2015-10-29 DIAGNOSIS — Z113 Encounter for screening for infections with a predominantly sexual mode of transmission: Secondary | ICD-10-CM | POA: Diagnosis not present

## 2015-10-29 DIAGNOSIS — O10919 Unspecified pre-existing hypertension complicating pregnancy, unspecified trimester: Secondary | ICD-10-CM

## 2015-10-29 LAB — POCT URINALYSIS DIP (DEVICE)
BILIRUBIN URINE: NEGATIVE
GLUCOSE, UA: NEGATIVE mg/dL
HGB URINE DIPSTICK: NEGATIVE
Ketones, ur: NEGATIVE mg/dL
NITRITE: NEGATIVE
Protein, ur: NEGATIVE mg/dL
SPECIFIC GRAVITY, URINE: 1.02 (ref 1.005–1.030)
Urobilinogen, UA: 0.2 mg/dL (ref 0.0–1.0)
pH: 6 (ref 5.0–8.0)

## 2015-10-29 LAB — OB RESULTS CONSOLE GC/CHLAMYDIA: Gonorrhea: NEGATIVE

## 2015-10-29 NOTE — Progress Notes (Signed)
Pt did not go to US appt on 9/15 as scheduled - said she was not contacted with the appt information. US rescheduled to tomorrow @ 1545. Pt is scheduled for IOL on 9/25.

## 2015-10-29 NOTE — Addendum Note (Signed)
Addended by: Jill SideAY, Alianna Wurster L on: 10/29/2015 01:37 PM   Modules accepted: Orders

## 2015-10-29 NOTE — Addendum Note (Signed)
Addended by: Cheree DittoGRAHAM, DEMETRICE A on: 10/29/2015 04:13 PM   Modules accepted: Orders

## 2015-10-29 NOTE — Progress Notes (Signed)
   PRENATAL VISIT NOTE  Subjective:  Kendra Harris is a 35 y.o. [id number] at 4227w2d being seen today for ongoing prenatal care.  She is currently monitored for the following issues for this high-risk pregnancy and has Morbid obesity with BMI of 45.0-49.9, adult (HCC); MIGRAINE HEADACHE; Essential hypertension, benign; GERD; DIABETES MELLITUS, GESTATIONAL, HX OF; Severe major depression with psychotic features (HCC); Menorrhagia; Lymphadenopathy of left cervical region; Supervision of high-risk pregnancy; Chronic hypertension in pregnancy; Advanced maternal age in multigravida; Substance abuse affecting pregnancy, antepartum; Obesity in pregnancy, antepartum; Dental abscess; Group B Streptococcus carrier, +RV culture, currently pregnant; and Chronic hypertension during pregnancy, antepartum on her problem list.  Patient reports no complaints.  Contractions: Not present. Vag. Bleeding: None.  Movement: Present. Denies leaking of fluid.   The following portions of the patient's history were reviewed and updated as appropriate: allergies, current medications, past family history, past medical history, past social history, past surgical history and problem list. Problem list updated.  Objective:   Vitals:   10/29/15 0848  BP: 120/82  Pulse: 73  Weight: 250 lb 4.8 oz (113.5 kg)    Fetal Status: Fetal Heart Rate (bpm): NST   Movement: Present     General:  Alert, oriented and cooperative. Patient is in no acute distress.  Skin: Skin is warm and dry. No rash noted.   Cardiovascular: Normal heart rate noted  Respiratory: Normal respiratory effort, no problems with respiration noted  Abdomen: Soft, gravid, appropriate for gestational age. Pain/Pressure: Absent     Pelvic:  Cervical exam deferred        Extremities: Normal range of motion.  Edema: None  Mental Status: Normal mood and affect. Normal behavior. Normal judgment and thought content.   Urinalysis:      Assessment and Plan:    Pregnancy: G5P4004 at 7527w2d  1. Supervision of high-risk pregnancy, third trimester Patient is doing well without complaints Patient left without having blood drawn last visit because she needed to pick up her son.  Patient is willing to stay for 1 hour glucola today and labs Ultrasound scheduled on 9/21  2. Obesity in pregnancy, antepartum, third trimester   3. Chronic hypertension in pregnancy, third trimester Continue labetalol Patient scheduled for IOL on 9/25 - Fetal nonstress test- NST reviewed and reactive   Term labor symptoms and general obstetric precautions including but not limited to vaginal bleeding, contractions, leaking of fluid and fetal movement were reviewed in detail with the patient. Please refer to After Visit Summary for other counseling recommendations.  Return in about 6 weeks (around 12/10/2015) for PP visit.  IOL on 9/25.  Catalina AntiguaPeggy Indie Nickerson, MD

## 2015-10-30 ENCOUNTER — Encounter (HOSPITAL_COMMUNITY): Payer: Self-pay

## 2015-10-30 ENCOUNTER — Ambulatory Visit (HOSPITAL_COMMUNITY)
Admission: RE | Admit: 2015-10-30 | Discharge: 2015-10-30 | Disposition: A | Discharge status: Home / Self Care | Payer: Medicaid Other | Source: Ambulatory Visit | Attending: Obstetrics & Gynecology | Admitting: Obstetrics & Gynecology

## 2015-10-30 DIAGNOSIS — Z3A38 38 weeks gestation of pregnancy: Secondary | ICD-10-CM | POA: Insufficient documentation

## 2015-10-30 DIAGNOSIS — O0933 Supervision of pregnancy with insufficient antenatal care, third trimester: Secondary | ICD-10-CM | POA: Diagnosis not present

## 2015-10-30 DIAGNOSIS — O0993 Supervision of high risk pregnancy, unspecified, third trimester: Secondary | ICD-10-CM

## 2015-10-30 DIAGNOSIS — O09293 Supervision of pregnancy with other poor reproductive or obstetric history, third trimester: Secondary | ICD-10-CM | POA: Diagnosis not present

## 2015-10-30 DIAGNOSIS — Z36 Encounter for antenatal screening of mother: Secondary | ICD-10-CM | POA: Diagnosis not present

## 2015-10-30 DIAGNOSIS — O99213 Obesity complicating pregnancy, third trimester: Secondary | ICD-10-CM | POA: Diagnosis not present

## 2015-10-30 DIAGNOSIS — O10013 Pre-existing essential hypertension complicating pregnancy, third trimester: Secondary | ICD-10-CM | POA: Insufficient documentation

## 2015-10-30 DIAGNOSIS — O09522 Supervision of elderly multigravida, second trimester: Secondary | ICD-10-CM | POA: Insufficient documentation

## 2015-10-30 HISTORY — DX: Gestational diabetes mellitus in pregnancy, unspecified control: O24.419

## 2015-10-30 LAB — PRENATAL PROFILE (SOLSTAS)
Antibody Screen: NEGATIVE
Basophils Absolute: 0 cells/uL (ref 0–200)
Basophils Relative: 0 %
EOS ABS: 100 {cells}/uL (ref 15–500)
Eosinophils Relative: 1 %
HEMATOCRIT: 34.9 % — AB (ref 35.0–45.0)
HIV: NONREACTIVE
Hemoglobin: 11.3 g/dL — ABNORMAL LOW (ref 11.7–15.5)
Hepatitis B Surface Ag: NEGATIVE
LYMPHS PCT: 13 %
Lymphs Abs: 1300 cells/uL (ref 850–3900)
MCH: 26.4 pg — AB (ref 27.0–33.0)
MCHC: 32.4 g/dL (ref 32.0–36.0)
MCV: 81.5 fL (ref 80.0–100.0)
MONO ABS: 500 {cells}/uL (ref 200–950)
MONOS PCT: 5 %
MPV: 12 fL (ref 7.5–12.5)
NEUTROS PCT: 81 %
Neutro Abs: 8100 cells/uL — ABNORMAL HIGH (ref 1500–7800)
Platelets: 236 10*3/uL (ref 140–400)
RBC: 4.28 MIL/uL (ref 3.80–5.10)
RDW: 13.8 % (ref 11.0–15.0)
RH TYPE: POSITIVE
Rubella: 4.74 Index — ABNORMAL HIGH (ref <0.90)
WBC: 10 10*3/uL (ref 3.8–10.8)

## 2015-10-30 LAB — COMPREHENSIVE METABOLIC PANEL
ALBUMIN: 3 g/dL — AB (ref 3.6–5.1)
ALT: 5 U/L — ABNORMAL LOW (ref 6–29)
AST: 7 U/L — ABNORMAL LOW (ref 10–30)
Alkaline Phosphatase: 160 U/L — ABNORMAL HIGH (ref 33–115)
BUN: 8 mg/dL (ref 7–25)
CHLORIDE: 103 mmol/L (ref 98–110)
CO2: 21 mmol/L (ref 20–31)
Calcium: 8.3 mg/dL — ABNORMAL LOW (ref 8.6–10.2)
Creat: 0.69 mg/dL (ref 0.50–1.10)
Glucose, Bld: 149 mg/dL — ABNORMAL HIGH (ref 65–99)
Potassium: 4 mmol/L (ref 3.5–5.3)
Sodium: 134 mmol/L — ABNORMAL LOW (ref 135–146)
TOTAL PROTEIN: 6.1 g/dL (ref 6.1–8.1)
Total Bilirubin: 0.4 mg/dL (ref 0.2–1.2)

## 2015-10-30 LAB — GC/CHLAMYDIA PROBE AMP (~~LOC~~) NOT AT ARMC
CHLAMYDIA, DNA PROBE: NEGATIVE
NEISSERIA GONORRHEA: NEGATIVE

## 2015-10-30 LAB — GLUCOSE TOLERANCE, 1 HOUR (50G) W/O FASTING: Glucose, 1 Hr, gestational: 153 mg/dL — ABNORMAL HIGH (ref <140)

## 2015-10-31 ENCOUNTER — Ambulatory Visit (INDEPENDENT_AMBULATORY_CARE_PROVIDER_SITE_OTHER): Payer: Medicaid Other | Admitting: Family Medicine

## 2015-10-31 ENCOUNTER — Encounter: Payer: Self-pay | Admitting: Family Medicine

## 2015-10-31 VITALS — BP 116/110 | HR 62 | Temp 98.2°F | Wt 251.0 lb

## 2015-10-31 DIAGNOSIS — M75102 Unspecified rotator cuff tear or rupture of left shoulder, not specified as traumatic: Secondary | ICD-10-CM

## 2015-10-31 DIAGNOSIS — M75100 Unspecified rotator cuff tear or rupture of unspecified shoulder, not specified as traumatic: Secondary | ICD-10-CM | POA: Insufficient documentation

## 2015-10-31 NOTE — Assessment & Plan Note (Signed)
Given that patient is [redacted] weeks pregnant and will be induced in 3 days, will avoid NSAID therapy at this time. Discussed importance of rest. No signs of an acute tear. Continue with cold packs. Also discussed rehab exercises. Instructed patient to use exercises over the next few days. Told patient if her pain was not getting better after her delivery, she should be seen again and started on NSAIDs. Also discussed injections should conservative management not significantly improve symptoms.

## 2015-10-31 NOTE — Patient Instructions (Signed)

## 2015-10-31 NOTE — Progress Notes (Signed)
    Subjective:  Kendra Harris is a 35 y.o. female [facility] with a chief complaint of left shoulder pain.   HPI:  Left Shoulder Pain Started 3 days ago after waking up. Pain is mostly located in the posteriolateral aspect. She has tried using [employer] Hot, hot and cold pads, and tylenol which have not helped significantly. No trauma. Pain is worse with movement of her arm. No weakness. No swelling. Had something similar "a long time ago."   ROS: Per HPI  Objective:  Physical Exam: BP (!) 116/110   Pulse 62   Temp 98.2 F (36.8 C) (Oral)   Wt 251 lb (113.9 kg)   SpO2 100%   BMI 45.91 kg/m   Gen: NAD, resting comfortably Pulm: NWOB MSK:  - Left Shoulder: No deformites or effusions. Tender to palpation over lateral most aspect of acromion. FROM, in all directions, though limited secondary to pain. Painful arc noted. Strength 5/5 in all fields. Positive Neer and Hawkin's test. - Right Shoulder: No deformities or effusions. Non tender to palpation. FROM. Strength 5/5 in all directions. Skin: warm, dry Neuro: grossly normal, moves all extremities Psych: Normal affect and thought content  Assessment/Plan:  Rotator cuff syndrome Given that patient is [redacted] weeks pregnant and will be induced in 3 days, will avoid NSAID therapy at this time. Discussed importance of rest. No signs of an acute tear. Continue with cold packs. Also discussed rehab exercises. Instructed patient to use exercises over the next few days. Told patient if her pain was not getting better after her delivery, she should be seen again and started on NSAIDs. Also discussed injections should conservative management not significantly improve symptoms.   Katina Degreealeb M. Jimmey RalphParker, MD [facility] Resident PGY-3 10/31/2015 10:07 AM

## 2015-11-03 ENCOUNTER — Telehealth (HOSPITAL_COMMUNITY): Payer: Self-pay | Admitting: *Deleted

## 2015-11-03 ENCOUNTER — Inpatient Hospital Stay (HOSPITAL_COMMUNITY)
Admission: RE | Admit: 2015-11-03 | Discharge: 2015-11-05 | DRG: 774 | Disposition: A | Discharge status: Home / Self Care | Payer: Medicaid Other | Source: Ambulatory Visit | Attending: Family Medicine | Admitting: Family Medicine

## 2015-11-03 ENCOUNTER — Encounter (HOSPITAL_COMMUNITY): Payer: Self-pay

## 2015-11-03 VITALS — BP 136/83 | HR 75 | Temp 97.8°F | Resp 18 | Ht 62.0 in | Wt 252.0 lb

## 2015-11-03 DIAGNOSIS — O99824 Streptococcus B carrier state complicating childbirth: Secondary | ICD-10-CM | POA: Diagnosis present

## 2015-11-03 DIAGNOSIS — O10913 Unspecified pre-existing hypertension complicating pregnancy, third trimester: Secondary | ICD-10-CM

## 2015-11-03 DIAGNOSIS — O1002 Pre-existing essential hypertension complicating childbirth: Principal | ICD-10-CM | POA: Diagnosis present

## 2015-11-03 DIAGNOSIS — Z3A39 39 weeks gestation of pregnancy: Secondary | ICD-10-CM

## 2015-11-03 DIAGNOSIS — O99214 Obesity complicating childbirth: Secondary | ICD-10-CM | POA: Diagnosis present

## 2015-11-03 DIAGNOSIS — O99210 Obesity complicating pregnancy, unspecified trimester: Secondary | ICD-10-CM | POA: Diagnosis present

## 2015-11-03 DIAGNOSIS — O24419 Gestational diabetes mellitus in pregnancy, unspecified control: Secondary | ICD-10-CM

## 2015-11-03 DIAGNOSIS — O10919 Unspecified pre-existing hypertension complicating pregnancy, unspecified trimester: Secondary | ICD-10-CM | POA: Diagnosis present

## 2015-11-03 DIAGNOSIS — Z6841 Body Mass Index (BMI) 40.0 and over, adult: Secondary | ICD-10-CM | POA: Diagnosis not present

## 2015-11-03 DIAGNOSIS — O139 Gestational [pregnancy-induced] hypertension without significant proteinuria, unspecified trimester: Secondary | ICD-10-CM | POA: Insufficient documentation

## 2015-11-03 DIAGNOSIS — O24420 Gestational diabetes mellitus in childbirth, diet controlled: Secondary | ICD-10-CM | POA: Diagnosis present

## 2015-11-03 DIAGNOSIS — O09529 Supervision of elderly multigravida, unspecified trimester: Secondary | ICD-10-CM

## 2015-11-03 LAB — CBC
HEMATOCRIT: 33.7 % — AB (ref 36.0–46.0)
Hemoglobin: 11.5 g/dL — ABNORMAL LOW (ref 12.0–15.0)
MCH: 27.1 pg (ref 26.0–34.0)
MCHC: 34.1 g/dL (ref 30.0–36.0)
MCV: 79.5 fL (ref 78.0–100.0)
PLATELETS: 237 10*3/uL (ref 150–400)
RBC: 4.24 MIL/uL (ref 3.87–5.11)
RDW: 13.9 % (ref 11.5–15.5)
WBC: 10 10*3/uL (ref 4.0–10.5)

## 2015-11-03 LAB — TYPE AND SCREEN
ABO/RH(D): O POS
Antibody Screen: NEGATIVE

## 2015-11-03 LAB — GLUCOSE, CAPILLARY
Glucose-Capillary: 81 mg/dL (ref 65–99)
Glucose-Capillary: 145 mg/dL — ABNORMAL HIGH (ref 65–99)

## 2015-11-03 LAB — GLUCOSE, RANDOM: Glucose, Bld: 96 mg/dL (ref 65–99)

## 2015-11-03 MED ORDER — ZOLPIDEM TARTRATE 5 MG PO TABS: 5.0000 mg | ORAL_TABLET | Freq: Every evening | ORAL | Status: DC | PRN

## 2015-11-03 MED ORDER — BENZOCAINE-MENTHOL 20-0.5 % EX AERO
1.0000 "application " | INHALATION_SPRAY | CUTANEOUS | Status: DC | PRN
  Administered 2015-11-04: 1 via TOPICAL
  Filled 2015-11-03: qty 56

## 2015-11-03 MED ORDER — OXYCODONE-ACETAMINOPHEN 5-325 MG PO TABS: 1.0000 | ORAL_TABLET | ORAL | Status: DC | PRN

## 2015-11-03 MED ORDER — LACTATED RINGERS IV SOLN
500.0000 mL | INTRAVENOUS | Status: DC | PRN
  Administered 2015-11-03: 300 mL via INTRAVENOUS

## 2015-11-03 MED ORDER — OXYTOCIN 40 UNITS IN LACTATED RINGERS INFUSION - SIMPLE MED
1.0000 m[IU]/min | INTRAVENOUS | Status: DC
  Administered 2015-11-03: 2 m[IU]/min via INTRAVENOUS

## 2015-11-03 MED ORDER — IBUPROFEN 600 MG PO TABS
600.0000 mg | ORAL_TABLET | Freq: Four times a day (QID) | ORAL | Status: DC
  Administered 2015-11-04 – 2015-11-05 (×6): 600 mg via ORAL
  Filled 2015-11-03 (×6): qty 1

## 2015-11-03 MED ORDER — FENTANYL CITRATE (PF) 100 MCG/2ML IJ SOLN
100.0000 ug | INTRAMUSCULAR | Status: DC | PRN
  Administered 2015-11-03: 100 ug via INTRAVENOUS
  Filled 2015-11-03: qty 2

## 2015-11-03 MED ORDER — OXYTOCIN 40 UNITS IN LACTATED RINGERS INFUSION - SIMPLE MED
2.5000 [IU]/h | INTRAVENOUS | Status: DC
  Filled 2015-11-03: qty 1000

## 2015-11-03 MED ORDER — MISOPROSTOL 25 MCG QUARTER TABLET
25.0000 ug | ORAL_TABLET | ORAL | Status: DC | PRN
  Administered 2015-11-03 (×2): 25 ug via BUCCAL
  Filled 2015-11-03: qty 1
  Filled 2015-11-03 (×2): qty 0.25

## 2015-11-03 MED ORDER — FLEET ENEMA 7-19 GM/118ML RE ENEM: 1.0000 | ENEMA | RECTAL | Status: DC | PRN

## 2015-11-03 MED ORDER — DIPHENHYDRAMINE HCL 25 MG PO CAPS: 25.0000 mg | ORAL_CAPSULE | Freq: Four times a day (QID) | ORAL | Status: DC | PRN

## 2015-11-03 MED ORDER — ONDANSETRON HCL 4 MG/2ML IJ SOLN
4.0000 mg | INTRAMUSCULAR | Status: DC | PRN
Start: 2015-11-03 — End: 2015-11-05

## 2015-11-03 MED ORDER — ONDANSETRON HCL 4 MG PO TABS: 4.0000 mg | ORAL_TABLET | ORAL | Status: DC | PRN

## 2015-11-03 MED ORDER — TETANUS-DIPHTH-ACELL PERTUSSIS 5-2.5-18.5 LF-MCG/0.5 IM SUSP: 0.5000 mL | Freq: Once | INTRAMUSCULAR | Status: DC

## 2015-11-03 MED ORDER — WITCH HAZEL-GLYCERIN EX PADS: 1.0000 "application " | MEDICATED_PAD | CUTANEOUS | Status: DC | PRN

## 2015-11-03 MED ORDER — ACETAMINOPHEN 325 MG PO TABS: 650.0000 mg | ORAL_TABLET | ORAL | Status: DC | PRN

## 2015-11-03 MED ORDER — COCONUT OIL OIL: 1.0000 "application " | TOPICAL_OIL | Status: DC | PRN

## 2015-11-03 MED ORDER — SENNOSIDES-DOCUSATE SODIUM 8.6-50 MG PO TABS
2.0000 | ORAL_TABLET | ORAL | Status: DC
  Administered 2015-11-04 – 2015-11-05 (×2): 2 via ORAL
  Filled 2015-11-03 (×2): qty 2

## 2015-11-03 MED ORDER — DIBUCAINE 1 % RE OINT: 1.0000 "application " | TOPICAL_OINTMENT | RECTAL | Status: DC | PRN

## 2015-11-03 MED ORDER — ONDANSETRON HCL 4 MG/2ML IJ SOLN: 4.0000 mg | Freq: Four times a day (QID) | INTRAMUSCULAR | Status: DC | PRN

## 2015-11-03 MED ORDER — PRENATAL MULTIVITAMIN CH
1.0000 | ORAL_TABLET | Freq: Every day | ORAL | Status: DC
  Administered 2015-11-04: 1 via ORAL
  Filled 2015-11-03: qty 1

## 2015-11-03 MED ORDER — OXYTOCIN BOLUS FROM INFUSION: 500.0000 mL | Freq: Once | INTRAVENOUS | Status: DC

## 2015-11-03 MED ORDER — AMLODIPINE BESYLATE 10 MG PO TABS
10.0000 mg | ORAL_TABLET | Freq: Every day | ORAL | Status: DC
  Administered 2015-11-04 – 2015-11-05 (×2): 10 mg via ORAL
  Filled 2015-11-03 (×3): qty 1

## 2015-11-03 MED ORDER — LACTATED RINGERS IV SOLN
INTRAVENOUS | Status: DC
  Administered 2015-11-03 (×2): 1000 mL via INTRAVENOUS

## 2015-11-03 MED ORDER — OXYCODONE-ACETAMINOPHEN 5-325 MG PO TABS: 2.0000 | ORAL_TABLET | ORAL | Status: DC | PRN

## 2015-11-03 MED ORDER — LIDOCAINE HCL (PF) 1 % IJ SOLN
30.0000 mL | INTRAMUSCULAR | Status: DC | PRN
  Filled 2015-11-03: qty 30

## 2015-11-03 MED ORDER — SIMETHICONE 80 MG PO CHEW: 80.0000 mg | CHEWABLE_TABLET | ORAL | Status: DC | PRN

## 2015-11-03 MED ORDER — LABETALOL HCL 200 MG PO TABS
200.0000 mg | ORAL_TABLET | Freq: Two times a day (BID) | ORAL | Status: DC
  Administered 2015-11-03 (×2): 200 mg via ORAL
  Filled 2015-11-03 (×3): qty 1

## 2015-11-03 MED ORDER — TERBUTALINE SULFATE 1 MG/ML IJ SOLN
0.2500 mg | Freq: Once | INTRAMUSCULAR | Status: DC | PRN
  Filled 2015-11-03: qty 1

## 2015-11-03 MED ORDER — SOD CITRATE-CITRIC ACID 500-334 MG/5ML PO SOLN: 30.0000 mL | ORAL | Status: DC | PRN

## 2015-11-03 NOTE — Progress Notes (Signed)
LABOR PROGRESS NOTE  Kendra Harris is a 35 y.o. [id number] at 1037w0d  admitted for IOL for cHTN  Subjective: Doing well. Denies any concerns  Objective: BP (!) 143/74   Pulse 75   Temp 98 F (36.7 C) (Oral)   Resp 16   Ht 5\' 2"  (1.575 m)   Wt 252 lb (114.3 kg)   BMI 46.09 kg/m  or  Vitals:   11/03/15 0809 11/03/15 1137 11/03/15 1231 11/03/15 1506  BP: (!) 171/101 138/74 (!) 143/74   Pulse: 67 72 75   Resp: 18 18  16   Temp: 99.6 F (37.6 C) 98.4 F (36.9 C)  98 F (36.7 C)  TempSrc: Oral Oral  Oral  Weight: 252 lb (114.3 kg)     Height: 5\' 2"  (1.575 m)       NAD Dilation: 4 Effacement (%): 50 Cervical Position: Middle Station: -2 Presentation: Vertex Exam by:: Dr Nira Retortegele FHT: baseline rate 145, moderate varibility, +acel, no decel Toco: ctx ~q635min  Labs: Lab Results  Component Value Date   WBC 10.0 11/03/2015   HGB 11.5 (L) 11/03/2015   HCT 33.7 (L) 11/03/2015   MCV 79.5 11/03/2015   PLT 237 11/03/2015    Patient Active Problem List   Diagnosis Date Noted  . PIH (pregnancy induced hypertension) 11/03/2015  . Gestational diabetes 11/03/2015  . Rotator cuff syndrome 10/31/2015  . Dental abscess 07/31/2014  . Obesity in pregnancy, antepartum   . Substance abuse affecting pregnancy, antepartum 05/30/2014  . Chronic hypertension in pregnancy 05/23/2014  . Advanced maternal age in multigravida 05/23/2014  . Supervision of high-risk pregnancy 04/10/2014  . Lymphadenopathy of left cervical region 06/29/2013  . Menorrhagia 04/27/2013  . Severe major depression with psychotic features (HCC) 07/24/2010  . DIABETES MELLITUS, GESTATIONAL, HX OF 02/13/2009  . MIGRAINE HEADACHE 12/13/2007  . GERD 11/09/2007  . Essential hypertension, benign 08/31/2007  . Morbid obesity with BMI of 45.0-49.9, adult (HCC) 04/07/2006    Assessment / Plan: 35 y.o. [id number] at 697w0d here for IOL  Labor: cervical ripening with FB and cytotec Fetal Wellbeing:  Cat I Pain Control:   IV pain meds. Pt not planning on getting epidural Anticipated MOD:  SVD  Frederik PearJulie P Cherlyn Syring, MD 11/03/2015, 4:36 PM

## 2015-11-03 NOTE — H&P (Signed)
LABOR AND DELIVERY ADMISSION HISTORY AND PHYSICAL NOTE  Kendra Harris is a 35 y.o. female [phone] with IUP at [redacted]w[redacted]d by 2nd-trimester U/S presenting for IOL for cHTN. Also had abnormal 1-hr glucola on 10/29/15.  She reports positive fetal movement. She denies leakage of fluid or vaginal bleeding.  Prenatal History/Complications: Clinic [facility] Prenatal Labs  Dating 2nd trimester [place] Blood type: O/POS/-- (04/14 1502) O+  Genetic Screen Too late Antibody:neg  Anatomic [place] Normal, but limited views; rpt scheduled Rubella:   GTT  1 hr 153 (test performed on 9/20) RPR: NR  Flu vaccine  declined HBsAg: Neg  TDaP vaccine  declined                                      HIV: NR  GBS                                       GBS: negative  Contraception  btl (too late for papers this pregnancy) Pap: wnl 05/2014  Baby Food Bottle   Circumcision     Pediatrician [facility]   Support Person  Loura Back     Past Medical History: Past Medical History:  Diagnosis Date  . DELAYED MENSES 02/11/2010   Qualifier: Diagnosis of  By: Wallene Huh  MD, Rande Lawman    . Gestational diabetes   . Hypertension   . Urinary tract infection 03/04/2011    Past Surgical History: Past Surgical History:  Procedure Laterality Date  . NO PAST SURGERIES      Obstetrical History: OB History    Gravida Para Term Preterm AB Living   5 4 4  0 0 4   SAB TAB Ectopic Multiple Live Births   0 0 0 0 4      Social History: Social History   Social History  . Marital status: Single    Spouse name: N/A  . Number of children: N/A  . Years of education: N/A   Social History Main Topics  . Smoking status: Never Smoker  . Smokeless tobacco: Never Used  . Alcohol use No  . Drug use: No  . Sexual activity: Yes    Birth control/ protection: Condom   Other Topics Concern  . Not on file   Social History Narrative  . No narrative on file    Family History: Family History  Problem Relation Age of Onset  . Sickle cell trait Sister      Allergies: No Known Allergies  Prescriptions Prior to Admission  Medication Sig Dispense Refill Last Dose  . calcium carbonate (TUMS - DOSED IN MG ELEMENTAL CALCIUM) 500 MG chewable tablet Chew 1 tablet by mouth daily.   Past Month at Unknown time  . labetalol (NORMODYNE) 200 MG tablet Take 1 tablet (200 mg total) by mouth 2 (two) times daily. 60 tablet 3 11/02/2015 at 2000  . Prenat w/o A Vit-FeFum-FePo-FA (CONCEPT OB) 130-92.4-1 MG CAPS Take 1 capsule by mouth daily. 30 capsule 3 11/02/2015 at Unknown time     Review of Systems  All systems reviewed and negative except as stated in HPI  Blood pressure (!) 143/74, pulse 75, temperature 98.4 F (36.9 C), temperature source Oral, resp. rate 18, weight 252 lb (114.3 kg), unknown if currently breastfeeding. General appearance: alert, cooperative and no distress Lungs: normal WOB, no  respiratory distress Heart: regular rate and rhythm Abdomen: soft, non-tender; bowel sounds normal Extremities: No calf swelling or tenderness Presentation: cephalic Fetal monitoring: baseline rate 140, moderate variability, +acel, no decel Uterine activity: occasional contracion Dilation: 2 Effacement (%): 50 Station: -2 Exam by:: dr Degele   Prenatal labs: ABO, Rh: O/POS/-- (09/20 1014) Antibody: NEG (09/20 1014) Rubella: !Error! RPR: NON REAC (09/20 1014)  HBsAg: NEGATIVE (09/20 1014)  HIV: NONREACTIVE (09/20 1014)  GBS:   negative 1 hr Glucola: 153 on 9/20 (3hr GTT not done) Genetic screening:  Too late Anatomy US: normal   Prenatal Transfer Tool  Maternal Diabetes: h/o GDMA with all pregnancies, with abnormal 1-hr this pregnancy Genetic Screening: Declined Maternal Ultrasounds/Referrals: Normal Fetal Ultrasounds or other Referrals:  None Maternal Substance Abuse:  ? Significant Maternal Medications:  Meds include: Other: labetalol Significant Maternal Lab Results: none  Results for orders placed or performed during the hospital  encounter of 11/03/15 (from the past 24 hour(s))  CBC   Collection Time: 11/03/15  9:20 AM  Result Value Ref Range   WBC 10.0 4.0 - 10.5 K/uL   RBC 4.24 3.87 - 5.11 MIL/uL   Hemoglobin 11.5 (L) 12.0 - 15.0 g/dL   HCT 16.1 (L) 09.6 - 04.5 %   MCV 79.5 78.0 - 100.0 fL   MCH 27.1 26.0 - 34.0 pg   MCHC 34.1 30.0 - 36.0 g/dL   RDW 40.9 81.1 - 91.4 %   Platelets 237 150 - 400 K/uL  Glucose, random   Collection Time: 11/03/15  9:20 AM  Result Value Ref Range   Glucose, Bld 96 65 - 99 mg/dL    Patient Active Problem List   Diagnosis Date Noted  . PIH (pregnancy induced hypertension) 11/03/2015  . Rotator cuff syndrome 10/31/2015  . Chronic hypertension during pregnancy, antepartum 09/20/2014  . Group B Streptococcus carrier, +RV culture, currently pregnant 09/05/2014  . Dental abscess 07/31/2014  . Obesity in pregnancy, antepartum   . Substance abuse affecting pregnancy, antepartum 05/30/2014  . Chronic hypertension in pregnancy 05/23/2014  . Advanced maternal age in multigravida 05/23/2014  . Supervision of high-risk pregnancy 04/10/2014  . Lymphadenopathy of left cervical region 06/29/2013  . Menorrhagia 04/27/2013  . Severe major depression with psychotic features (HCC) 07/24/2010  . DIABETES MELLITUS, GESTATIONAL, HX OF 02/13/2009  . MIGRAINE HEADACHE 12/13/2007  . GERD 11/09/2007  . Essential hypertension, benign 08/31/2007  . Morbid obesity with BMI of 45.0-49.9, adult (HCC) 04/07/2006    Assessment: Kendra Harris is a 35 y.o. [id number] at [redacted]w[redacted]d here for IOL for cHTN  #Labor: cervical ripening with FB and cytotec #Pain: IV pain meds. Not planning on epidural #FWB: Category I #ID:  GBS negative #MOF: bottle #MOC:interval BTL (papers signed 10/22/15) #Circ:  N/a, girl  [name] 11/03/2015, 10:02 AM   Midwife attestation: I have seen and examined this patient; I agree with above documentation in the resident's note.   Kendra Harris is a 35 y.o. [id number]  @[redacted]w[redacted]d  here for IOL for CHTN.  PE: BP (!) 143/74   Pulse 75   Temp 98.4 F (36.9 C) (Oral)   Resp 18   Wt 252 lb (114.3 kg)   BMI 46.09 kg/m  Gen: calm comfortable, NAD Resp: normal effort, no distress Abd: gravid  ROS, labs, PMH reviewed  Plan: Admit to LD Labor: FB placed and start Cytotec 25 mcg buccal administration Q 4 hours FWB: Category I ID: GBS negative  LEFTWICH-KIRBY, Mame Twombly, CNM  11/03/2015,  1:22 PM

## 2015-11-03 NOTE — Progress Notes (Signed)
Pt having Kendra DittoGraham Crackers and coffee per Dr Delege orders.

## 2015-11-03 NOTE — Anesthesia Pain Management Evaluation Note (Signed)
  CRNA Pain Management Visit Note  Patient: Kendra Harris, 35 y.o., female  "Hello I am a member of the anesthesia team at [facility]. We have an anesthesia team available at all times to provide care throughout the hospital, including epidural management and anesthesia for C-section. I don't know your plan for the delivery whether it a natural birth, water birth, IV sedation, nitrous supplementation, doula or epidural, but we want to meet your pain goals."   1.Was your pain managed to your expectations on prior hospitalizations?   Yes   2.What is your expectation for pain management during this hospitalization?     Labor support without medications and IV pain meds  3.How can we help you reach that goal? Used Iv pain medicine for last 4 pregnancies, she expects to do the same with this one.  Record the patient's initial score and the patient's pain goal.   Pain: 0  Pain Goal: 9 [facility] wants you to be able to say your pain was always managed very well.  Syair Fricker 11/03/2015

## 2015-11-04 ENCOUNTER — Encounter (HOSPITAL_COMMUNITY): Payer: Self-pay

## 2015-11-04 LAB — GLUCOSE, CAPILLARY
GLUCOSE-CAPILLARY: 133 mg/dL — AB (ref 65–99)
GLUCOSE-CAPILLARY: 97 mg/dL (ref 65–99)
Glucose-Capillary: 184 mg/dL — ABNORMAL HIGH (ref 65–99)

## 2015-11-04 NOTE — Progress Notes (Signed)
UR chart review completed.  

## 2015-11-04 NOTE — Progress Notes (Signed)
Rn called Dr. Despina HiddenEure about blood sugar of 184 and that mom had just eaten a candy bar, cheeseburger and french fries.   Also Rn called Dr Despina HiddenEure about bp of 161/80 one hour later blood pressure came down to  145/66. No further orders given. The Doctor had already ordered blood pressure medicine.

## 2015-11-04 NOTE — Progress Notes (Signed)
POSTPARTUM PROGRESS NOTE  Post Partum Day 1 Subjective:  Kendra Harris is a 35 y.o. [id number] 7320w0d s/p SVD.  No acute events overnight.  Pt denies problems with ambulating, voiding or po intake.  She denies nausea or vomiting.  Pain is well controlled.  She has had flatus. She has not had bowel movement.  Lochia has been small.  Objective: Blood pressure 133/84, pulse 64, temperature 98.7 F (37.1 C), temperature source Oral, resp. rate 17, height 5\' 2"  (1.575 m), weight 252 lb (114.3 kg), SpO2 100 %, unknown if currently breastfeeding.  Physical Exam:  General: alert, cooperative and no distress Lochia:normal flow Chest: no respiratory distress Heart:regular rate, distal pulses intact Abdomen: soft, nontender,  Uterine Fundus: firm, appropriately tender DVT Evaluation: No calf swelling or tenderness Extremities: trace edema   Recent Labs  11/03/15 0920  HGB 11.5*  HCT 33.7*    Assessment/Plan:  ASSESSMENT: Kendra Harris is a 35 y.o. [id number] 6220w0d s/p SVD  Home tomorrow Monitor BP continue amlodipine Fasting CBG 97 will need 2 hour at post partum visit.   LOS: 1 day   Les Pouicholas SchenkMD 11/04/2015, 2:12 PM

## 2015-11-04 NOTE — Progress Notes (Signed)
Patient ID: Kendra Harris, female   DOB: 27-Aug-1980, 35 y.o.   MRN: [id number]  POSTPARTUM PROGRESS NOTE  Post Partum Day 1 Subjective:  Kendra Harris is a 35 y.o. [id number] 6967w0d s/p SVD.  No acute events overnight.  Pt denies problems with ambulating, voiding or po intake.  She denies nausea or vomiting.  Pain is well controlled.  She has not had flatus. She has not had a bowel movement.  Lochia minimal.  Objective: Blood pressure (!) 142/90, pulse 66, temperature 97.9 F (36.6 C), temperature source Oral, resp. rate 18, height 5\' 2"  (1.575 m), weight 114.3 kg (252 lb), unknown if currently breastfeeding.  Physical Exam:  General: alert, cooperative and no distress Lochia:normal flow Chest: CTAB Heart: RRR no m/r/g Abdomen: +BS, soft, nontender,  Uterine Fundus: firm DVT Evaluation: No calf swelling or tenderness Extremities: Mild edema   Recent Labs  11/03/15 0920  HGB 11.5*  HCT 33.7*    Assessment/Plan:  ASSESSMENT: Kendra Harris is a 35 y.o. [id number] 7967w0d s/p SVD. Doing well. Bottle feeding. Monitoring blood pressures.  Plan for discharge tomorrow.   LOS: 1 day   Franki CabotZachary L Anya Murphey, Medical Student 11/04/2015, 7:38 AM

## 2015-11-04 NOTE — Clinical Social Work Maternal (Signed)
CLINICAL SOCIAL WORK MATERNAL/CHILD NOTE  Patient Details  Name: Kendra Harris Harris MRN: [id number] Date of Birth: 09/23/1980  Date:  11/04/2015  Clinical Social Worker Initiating Note:  Joneisha Miles E. Laniece Hornbaker, LCSW Date/ Time Initiated:  11/04/15/1400     Child's Name:  Kendra Harris Harris   Legal Guardian:  Other (Comment) (Parents: Kendra Harris Harris and Kendra Harris Harris)   Need for Interpreter:  None   Date of Referral:  11/04/15     Reason for Referral:  Behavioral Health Issues, including SI , Late or No Prenatal Care    Referral Source:  Central Nursery   Address:  [address] [place], [place] [address]  Phone number:  [phone]   Household Members:  Minor Children, Significant Other   Natural Supports (not living in the home):  Parent (MOB reports that she has a good support system, identifying her mother and FOB as her main support people.)   Professional Supports: None   Employment:     Type of Work:     Education:      Financial Resources:  Medicaid   Other Resources:  WIC, Food Stamps    Cultural/Religious Considerations Which May Impact Care: None stated.  MOB's facesheet notes religion as Baptist.  Strengths:  Ability to meet basic needs , Pediatrician chosen , Home prepared for child  (Pediatric follow up will be at [facility])   Risk Factors/Current Problems:  None   Cognitive State:  Able to Concentrate , Alert , Linear Thinking    Mood/Affect:  Euthymic , Calm , Interested , Comfortable    CSW Assessment: CSW met with MOB in her first floor room to introduce services, offer support and complete assessment due to LPNC at 37 weeks and hx of Depression.  MOB was pleasant and welcoming of CSW's visit.  CSW found MOB to be easy to engage. MOB reports she and baby are doing well.  She states she did not find out about the pregnancy until 6 months.  She initially felt upset about being pregnant as she did not want any more children at that  time.  She reports that she is happy now and in love with baby Kendra Harris.  She states she plans to have a BTL at 6 weeks to prevent any more pregnancies.  CSW informed her of hospital drug screen policy and mandatory reporting for positive screens, to which MOB stated no concerns.   MOB reports having all needed supplies for infant and a good support system.  She states she and FOB have been in a relationship off and on for 10 years, although this is their first child together.  MOB has 4 other children, whom she states are at home with FOB while she is in the hospital.  She also reports that FOB has 5 other children who spend a lot of time in the home with them.   CSW inquired about hx of Depression and provided education regarding perinatal mood disorders.  MOB states she was diagnosed with Depression in 2006 when her grandmother died.  She reports no symptoms in the past two years and that she is no longer needing medication for Depression.  She reports feeling well emotionally at this time and no increase in emotionality after the births of her other children.  She reports that she talks with her mom when she needs support and agrees to speak with a professional if PPD symptoms arise.   CSW Plan/Description:  Patient/Family Education , No Further Intervention Required/No   Barriers to Discharge    Kendra Harris Harris Elizabeth, LCSW 11/04/2015, 4:17 PM 

## 2015-11-05 LAB — RPR: RPR Ser Ql: NONREACTIVE

## 2015-11-05 MED ORDER — AMLODIPINE BESYLATE 10 MG PO TABS: 10.0000 mg | ORAL_TABLET | Freq: Every day | ORAL | 3 refills | Status: DC

## 2015-11-05 MED ORDER — ACETAMINOPHEN 325 MG PO TABS: 650.0000 mg | ORAL_TABLET | ORAL | 0 refills | Status: DC | PRN

## 2015-11-05 MED ORDER — HYDROCHLOROTHIAZIDE 25 MG PO TABS: 25.0000 mg | ORAL_TABLET | Freq: Every day | ORAL | 0 refills | Status: DC

## 2015-11-05 MED ORDER — HYDROCHLOROTHIAZIDE 25 MG PO TABS
25.0000 mg | ORAL_TABLET | Freq: Every day | ORAL | Status: DC
Start: 2015-11-05 — End: 2015-11-05
  Administered 2015-11-05: 25 mg via ORAL
  Filled 2015-11-05 (×2): qty 1

## 2015-11-05 NOTE — Discharge Summary (Signed)
OB Discharge Summary     Patient Name: Kendra Harris DOB: Dec 09, 1980 MRN: [id number]  Date of admission: 11/03/2015 Delivering MD: Ernestina Penna MICHAEL   Date of discharge: 11/05/2015  Admitting diagnosis: INDUCTION Intrauterine pregnancy: [redacted]w[redacted]d     Secondary diagnosis:  Active Problems:   Chronic hypertension in pregnancy   Advanced maternal age in multigravida   Obesity in pregnancy, antepartum   Gestational diabetes  Additional problems: none     Discharge diagnosis: Term Pregnancy Delivered, CHTN and GDM A1                                                                                                Post partum procedures:none  Augmentation: Pitocin, Cytotec and Foley Balloon  Complications: None  Hospital course:  Induction of Labor With Vaginal Delivery   35 y.o. yo G5P5005 at [redacted]w[redacted]d was admitted to the hospital 11/03/2015 for induction of labor.  Indication for induction: A1 DM, AMA and chronic hypertension.  Patient had an uncomplicated labor course as follows: Membrane Rupture Time/Date: 9:32 PM ,11/03/2015   Intrapartum Procedures: Episiotomy: None [1]                                         Lacerations:  1st degree [2]  Patient had delivery of a Viable infant.  Information for the patient's newborn:  Sandrika, Schwinn [[id number]]  Delivery Method: Vaginal, Spontaneous Delivery (Filed from Delivery Summary)    Pt did have some elevated Blood pressures in the post partum period. She was continued on 10mg  of amlodipine and started on 25mg  of HCTZ just prior to D/C she will have BP follow up with the baby love nurse.   11/03/2015  Details of delivery can be found in separate delivery note.  Patient had a routine postpartum course. Patient is discharged home 11/05/15.   Physical exam Vitals:   11/04/15 0723 11/04/15 1007 11/04/15 1757 11/05/15 0557  BP: (!) 142/90 133/84 (!) 144/82 (!) 153/82  Pulse: 66 64 76 (!) 58  Resp:  17 20 18   Temp:  98.7 F  (37.1 C) 98.9 F (37.2 C) 97.8 F (36.6 C)  TempSrc:  Oral Oral Oral  SpO2:  100%    Weight:      Height:       General: alert, cooperative and no distress Lochia: appropriate Uterine Fundus: firm Incision: N/A DVT Evaluation: No evidence of DVT seen on physical exam. No significant calf/ankle edema. Labs: Lab Results  Component Value Date   WBC 10.0 11/03/2015   HGB 11.5 (L) 11/03/2015   HCT 33.7 (L) 11/03/2015   MCV 79.5 11/03/2015   PLT 237 11/03/2015   CMP Latest Ref Rng & Units 11/03/2015  Glucose 65 - 99 mg/dL 96  BUN 7 - 25 mg/dL -  Creatinine 7.82 - 9.56 mg/dL -  Sodium 213 - 086 mmol/L -  Potassium 3.5 - 5.3 mmol/L -  Chloride 98 - 110 mmol/L -  CO2 20 - 31 mmol/L -  Calcium 8.6 - 10.2 mg/dL -  Total Protein 6.1 - 8.1 g/dL -  Total Bilirubin 0.2 - 1.2 mg/dL -  Alkaline Phos 33 - 161115 U/L -  AST 10 - 30 U/L -  ALT 6 - 29 U/L -    Discharge instruction: per After Visit Summary and "Baby and Me Booklet".  After visit meds:    Medication List    STOP taking these medications   calcium carbonate 500 MG chewable tablet Commonly known as:  TUMS - dosed in mg elemental calcium   labetalol 200 MG tablet Commonly known as:  NORMODYNE     TAKE these medications   acetaminophen 325 MG tablet Commonly known as:  TYLENOL Take 2 tablets (650 mg total) by mouth every 4 (four) hours as needed (for pain scale < 4).   amLODipine 10 MG tablet Commonly known as:  NORVASC Take 1 tablet (10 mg total) by mouth daily.   CONCEPT OB 130-92.4-1 MG Caps Take 1 capsule by mouth daily.   hydrochlorothiazide 25 MG tablet Commonly known as:  HYDRODIURIL Take 1 tablet (25 mg total) by mouth daily.       Diet: carb modified diet  Activity: Advance as tolerated. Pelvic rest for 6 weeks.   Outpatient follow up:2 weeks Follow up Appt:No future appointments. Follow up Visit:No Follow-up on file.  Postpartum contraception: Tubal Ligation, interval  Newborn  Data: Live born female  Birth Weight: 6 lb 11.9 oz (3059 g) APGAR: 9, 10  Baby Feeding: Bottle Disposition:home with mother   11/05/2015 Ernestina PennaNicholas Nydia Ytuarte, MD

## 2015-11-07 ENCOUNTER — Ambulatory Visit (HOSPITAL_COMMUNITY): Payer: Medicaid Other

## 2015-11-11 ENCOUNTER — Other Ambulatory Visit: Payer: Self-pay | Admitting: Obstetrics and Gynecology

## 2015-11-13 NOTE — Patient Instructions (Signed)
Your procedure is scheduled on:  Tuesday, Oct. 17, 2017  [facility] at:  6:00 AM  Pick up the phone at the desk and dial [phone].  Call this number if you have problems the morning of surgery: [phone].  Remember: Do NOT eat food or drink after:  Midnight Monday, Oct. 16, 2017  Take these medicines the morning of surgery with a SIP OF WATER:  Amlodipine, Hydrochlorothiazide  Stop ALL herbal medications at this time   Do NOT wear jewelry (body piercing), metal hair clips/bobby pins, make-up, or nail polish. Do NOT wear lotions, powders, or perfumes.  You may wear deodorant. Do NOT shave for 48 hours prior to surgery. Do NOT bring valuables to the hospital. Contacts, dentures, or bridgework may not be worn into surgery.  Have a responsible adult drive you home and stay with you for 24 hours after your procedure

## 2015-11-14 ENCOUNTER — Inpatient Hospital Stay (HOSPITAL_COMMUNITY)
Admission: RE | Admit: 2015-11-14 | Discharge: 2015-11-14 | Disposition: A | Discharge status: Home / Self Care | Payer: Medicaid Other | Source: Ambulatory Visit

## 2015-11-17 ENCOUNTER — Encounter: Payer: Self-pay | Admitting: Obstetrics and Gynecology

## 2015-11-17 ENCOUNTER — Encounter: Payer: Medicaid Other | Admitting: Obstetrics and Gynecology

## 2015-11-17 NOTE — Progress Notes (Signed)
Patient did not keep 11/17/2015 GYN pre op appointment. Will send message to office to see if patient still desires BTL.  Kendra Harris, Jr MD Attending Center for [facility] Midwife(Faculty Practice)

## 2015-11-20 ENCOUNTER — Inpatient Hospital Stay (HOSPITAL_COMMUNITY): Admission: RE | Admit: 2015-11-20 | Payer: Medicaid Other | Source: Ambulatory Visit

## 2015-11-25 ENCOUNTER — Ambulatory Visit (HOSPITAL_COMMUNITY)
Admission: RE | Admit: 2015-11-25 | Payer: Medicaid Other | Source: Ambulatory Visit | Admitting: Obstetrics and Gynecology

## 2015-11-25 ENCOUNTER — Encounter (HOSPITAL_COMMUNITY): Admission: RE | Payer: Self-pay | Source: Ambulatory Visit

## 2015-11-25 SURGERY — LIGATION, FALLOPIAN TUBE, LAPAROSCOPIC
Anesthesia: Choice | Site: Abdomen | Laterality: Bilateral

## 2015-12-18 ENCOUNTER — Ambulatory Visit: Payer: Medicaid Other | Admitting: Family Medicine

## 2016-04-12 ENCOUNTER — Encounter: Payer: Self-pay | Admitting: Obstetrics and Gynecology

## 2016-04-12 ENCOUNTER — Ambulatory Visit (INDEPENDENT_AMBULATORY_CARE_PROVIDER_SITE_OTHER): Payer: Medicaid Other | Admitting: Obstetrics and Gynecology

## 2016-04-12 VITALS — BP 128/88 | HR 69 | Temp 98.5°F | Ht 62.0 in | Wt 245.0 lb

## 2016-04-12 DIAGNOSIS — M545 Low back pain, unspecified: Secondary | ICD-10-CM

## 2016-04-12 LAB — POCT URINALYSIS DIPSTICK
GLUCOSE UA: NEGATIVE
KETONES UA: NEGATIVE
Leukocytes, UA: NEGATIVE
NITRITE UA: NEGATIVE
PH UA: 6
Protein, UA: 100
Urobilinogen, UA: 0.2

## 2016-04-12 NOTE — Patient Instructions (Addendum)
Back Pain, Adult  Back pain is very common. The pain often gets better over time. The cause of back pain is usually not dangerous. Most people can learn to manage their back pain on their own.  Follow these instructions at home:  Watch your back pain for any changes. The following actions may help to lessen any pain you are feeling:  · Stay active. Start with short walks on flat ground if you can. Try to walk farther each day.  · Exercise regularly as told by your doctor. Exercise helps your back heal faster. It also helps avoid future injury by keeping your muscles strong and flexible.  · Do not sit, drive, or stand in one place for more than 30 minutes.  · Do not stay in bed. Resting more than 1-2 days can slow down your recovery.  · Be careful when you bend or lift an object. Use good form when lifting:  ? Bend at your knees.  ? Keep the object close to your body.  ? Do not twist.  · Sleep on a firm mattress. Lie on your side, and bend your knees. If you lie on your back, put a pillow under your knees.  · Take medicines only as told by your doctor.  · Put ice on the injured area.  ? Put ice in a plastic bag.  ? Place a towel between your skin and the bag.  ? Leave the ice on for 20 minutes, 2-3 times a day for the first 2-3 days. After that, you can switch between ice and heat packs.  · Avoid feeling anxious or stressed. Find good ways to deal with stress, such as exercise.  · Maintain a healthy weight. Extra weight puts stress on your back.    Contact a doctor if:  · You have pain that does not go away with rest or medicine.  · You have worsening pain that goes down into your legs or buttocks.  · You have pain that does not get better in one week.  · You have pain at night.  · You lose weight.  · You have a fever or chills.  Get help right away if:  · You cannot control when you poop (bowel movement) or pee (urinate).  · Your arms or legs feel weak.  · Your arms or legs lose feeling (numbness).  · You feel sick  to your stomach (nauseous) or throw up (vomit).  · You have belly (abdominal) pain.  · You feel like you may pass out (faint).  This information is not intended to replace advice given to you by your health care provider. Make sure you discuss any questions you have with your health care provider.  Document Released: 07/14/2007 Document Revised: 07/03/2015 Document Reviewed: 05/29/2013  Elsevier Interactive Patient Education © 2017 [employer]

## 2016-04-12 NOTE — Progress Notes (Signed)
   Subjective:   Patient ID: Almond Lintasha L Postema, female    DOB: Mar 15, 1980, [age] y.o.   MRN: [id number]  Patient presents for Same Day Appointment  Chief Complaint  Patient presents with  . Back Pain    HPI: # Back Pain Having left sided and bilateral thoracic back pain Thinks it is a bladder infection Had one last year and feels similar No fevers No dysuria No abdominal pain No abnormal discharge or bleeding No concern for STDs Recently had a baby who is 5 months and a one year old who she lifts a lot during the day Denies weakness, numbness, tingling, radiation, incontinence   Review of Systems   See HPI for ROS.   History  Smoking Status  . Never Smoker  Smokeless Tobacco  . Never Used    Past medical history, surgical, family, and social history reviewed and updated in the EMR as appropriate.  Pertinent Historical Findings include: H/o severe depression, HTN, obesity Objective:  BP 128/88   Pulse 69   Temp 98.5 F (36.9 C) (Oral)   Ht 5\' 2"  (1.575 m)   Wt 245 lb (111.1 kg)   LMP 04/12/2016   SpO2 99%   BMI 44.81 kg/m  Vitals and nursing note reviewed  Physical Exam  Constitutional: She is well-developed, well-nourished, and in no distress.  Cardiovascular: Normal rate and regular rhythm.   Pulmonary/Chest: Effort normal and breath sounds normal.  Abdominal: Soft. Normal appearance. There is no tenderness. There is no guarding and no CVA tenderness.   Back Exam:  Inspection: Unremarkable  ROM normal SLR laying: Negative  XSLR laying: Negative  Palpable tenderness: None. Sensory change: Gross sensation intact to all lumbar and sacral dermatomes.  Reflexes: normal and symmetric Leg strength 5/5 Gait unremarkable.  Results for orders placed or performed in visit on 04/12/16 (from the past 24 hour(s))  Urinalysis Dipstick     Status: Abnormal   Collection Time: 04/12/16  2:03 PM  Result Value Ref Range   Color, UA RED    Clarity, UA CLOUDY    Glucose, UA NEG    Bilirubin, UA SMALL    Ketones, UA NEG    Spec Grav, UA >=1.030    Blood, UA LARGE    pH, UA 6.0    Protein, UA 100    Urobilinogen, UA 0.2    Nitrite, UA NEG    Leukocytes, UA Negative Negative   Narrative   QNS - Quantity Not Sufficient for microscopic exam    Assessment & Plan:  1. Acute left-sided low back pain without sciatica Auute back pain. No red flags. Neuro exam normal. Most likely exacerbated by repetitive lifting of her young children. UA negative. Conservative measures discussed. Follow-up prn.  - Urinalysis Dipstick   Patient advised if symptoms worsen return to clinic.   PATIENT EDUCATION PROVIDED: See AVS   Caryl AdaJazma Catalaya Garr, DO 04/12/2016, 1:53 PM PGY-3, [facility]

## 2016-04-27 ENCOUNTER — Ambulatory Visit: Payer: Medicaid Other | Admitting: Internal Medicine

## 2016-05-21 ENCOUNTER — Encounter: Payer: Self-pay | Admitting: Internal Medicine

## 2016-05-21 ENCOUNTER — Encounter: Payer: Medicaid Other | Admitting: Internal Medicine

## 2016-06-04 ENCOUNTER — Encounter: Payer: Self-pay | Admitting: Internal Medicine

## 2016-06-04 ENCOUNTER — Ambulatory Visit (INDEPENDENT_AMBULATORY_CARE_PROVIDER_SITE_OTHER): Payer: Medicaid Other | Admitting: Internal Medicine

## 2016-06-04 VITALS — BP 192/120 | HR 60 | Temp 98.1°F | Ht 62.0 in | Wt 234.0 lb

## 2016-06-04 DIAGNOSIS — F323 Major depressive disorder, single episode, severe with psychotic features: Secondary | ICD-10-CM | POA: Diagnosis not present

## 2016-06-04 DIAGNOSIS — Z3009 Encounter for other general counseling and advice on contraception: Secondary | ICD-10-CM

## 2016-06-04 DIAGNOSIS — I10 Essential (primary) hypertension: Secondary | ICD-10-CM | POA: Diagnosis not present

## 2016-06-04 LAB — POCT URINE PREGNANCY: PREG TEST UR: NEGATIVE

## 2016-06-04 MED ORDER — MEDROXYPROGESTERONE ACETATE 150 MG/ML IM SUSY
150.0000 mg | PREFILLED_SYRINGE | Freq: Once | INTRAMUSCULAR | Status: AC
  Administered 2016-06-04: 150 mg via INTRAMUSCULAR

## 2016-06-04 NOTE — Assessment & Plan Note (Signed)
Elevated blood pressure today concerning however patient has not been on any of her blood pressure medications. She plans to restart these medications. No current symptoms. Discussed the importance of getting her blood pressure under control -per review of medication list has both Norvasc and hydrochlorothiazide on her list however she states she is only able to take in Norvasc. - Patient to follow-up in one week to recheck her blood pressure now that she's restarted Norvasc  - will have her start hydrochlorothiazide if no improvement at follow-up visit

## 2016-06-04 NOTE — Assessment & Plan Note (Signed)
Denies any depression or suicidal ideation indicates her mood overall is excellent

## 2016-06-04 NOTE — Progress Notes (Signed)
Kendra Harris is a 36 y.o. female presents to office today for annual physical exam examination.  Concerns today include:  CHRONIC HTN: Indicates she has only been on Norvasc though in her chart she has both Norvasc and hydrochlorothiazide. She's been off her blood pressure medications for a while. Current Meds - will restart Norvasc Denies CP, dyspnea, HA, edema, dizziness / lightheadedness  Self breast exam  Last pap smear: 2016, normal   Women's Health  Periods: regular menstrual  Contraception: Depo  Pelvic symptoms: none  Sexual activity: yes, last sexual encounter about 2 months ago, using condoms previously  STD Screening: does not desire  Exercise: Has been exercising lately - goes out for walks with her daughter  Diet: improving  Smoking: none  Alcohol: none Drugs: none  Mood: no mood concerns  Dentist: [facility]    Past Medical History:  Diagnosis Date  . DELAYED MENSES 02/11/2010   Qualifier: Diagnosis of  By: Wallene Huh  MD, Rande Lawman    . Gestational diabetes   . Hypertension   . Urinary tract infection 03/04/2011   Social History   Social History  . Marital status: Single    Spouse name: N/A  . Number of children: N/A  . Years of education: N/A   Occupational History  . Not on file.   Social History Main Topics  . Smoking status: Never Smoker  . Smokeless tobacco: Never Used  . Alcohol use No  . Drug use: No  . Sexual activity: Yes    Birth control/ protection: Condom   Other Topics Concern  . Not on file   Social History Narrative  . No narrative on file   Past Surgical History:  Procedure Laterality Date  . NO PAST SURGERIES     Family History  Problem Relation Age of Onset  . Sickle cell trait Sister     ROS: Review of Systems Review of Systems  Constitutional: Negative for chills and fever.  HENT: Negative for congestion and sinus pain.   Eyes: Negative for blurred vision and double vision.  Respiratory: Negative for cough  and shortness of breath.   Cardiovascular: Negative for chest pain and palpitations.  Gastrointestinal: Negative for blood in stool, nausea and vomiting.  Genitourinary: Negative for dysuria and urgency.  Musculoskeletal: Negative for myalgias.  Skin: Negative for rash.  Neurological: Negative for dizziness and headaches.  Psychiatric/Behavioral: Negative for depression and suicidal ideas.    Physical exam Physical Exam  Constitutional: She is oriented to person, place, and time. She appears well-developed and well-nourished.  HENT:  Head: Normocephalic and atraumatic.  Eyes: Conjunctivae are normal.  Neck: Normal range of motion. Neck supple.  Cardiovascular: Normal rate, regular rhythm, normal heart sounds and intact distal pulses.   Pulmonary/Chest: Effort normal and breath sounds normal.  Abdominal: Soft. Bowel sounds are normal.  Musculoskeletal: Normal range of motion.  Neurological: She is alert and oriented to person, place, and time.  Skin: Skin is warm.  Psychiatric: She has a normal mood and affect.     Assessment/ Plan: Patient with HTN and hx of depression  here for annual physical exam.   Essential hypertension, benign Elevated blood pressure today concerning however patient has not been on any of her blood pressure medications. She plans to restart these medications. No current symptoms. Discussed the importance of getting her blood pressure under control -per review of medication list has both Norvasc and hydrochlorothiazide on her list however she states she is only able  to take in Norvasc. - Patient to follow-up in one week to recheck her blood pressure now that she's restarted Norvasc  - will have her start hydrochlorothiazide if no improvement at follow-up visit   Severe major depression with psychotic features Denies any depression or suicidal ideation indicates her mood overall is excellent   Noralee Chars PGY-2, [facility]

## 2016-06-04 NOTE — Patient Instructions (Signed)
Please follow up in 1 week to check your blood pressure again. Please start taking Norvasc for now. If blood pressure still elevated will want to start you HCTZ as well.

## 2016-08-20 ENCOUNTER — Ambulatory Visit: Payer: Medicaid Other | Admitting: Internal Medicine

## 2016-11-23 IMAGING — US US OB DETAIL+14 WK
1 series · 12 of 28 positions shown · non-contrast
Comparison: none

[Series 1: us ob +14 all · 12 of 88 slices shown]
[im 4/88]
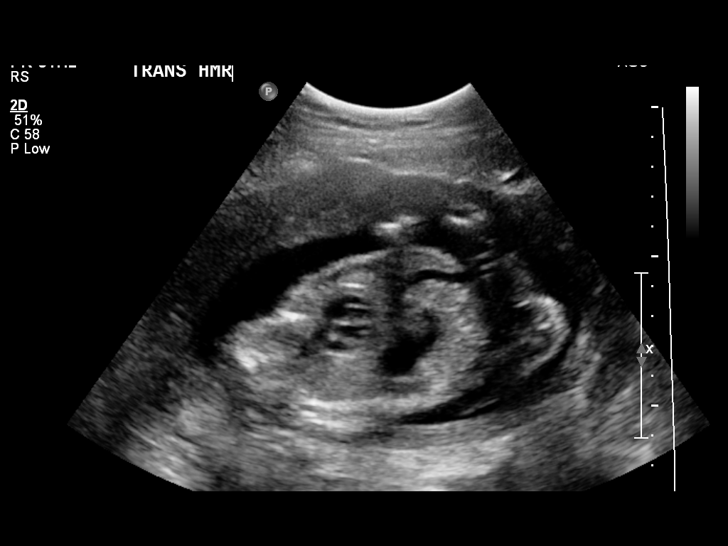
[im 10/88]
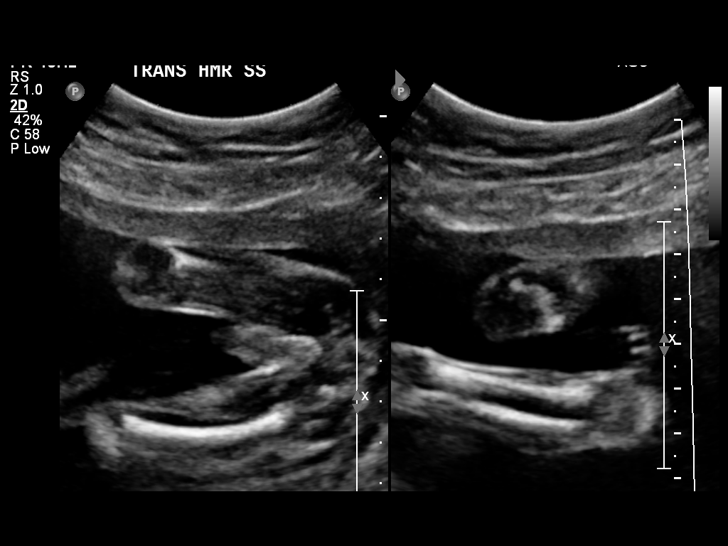
[im 17/88]
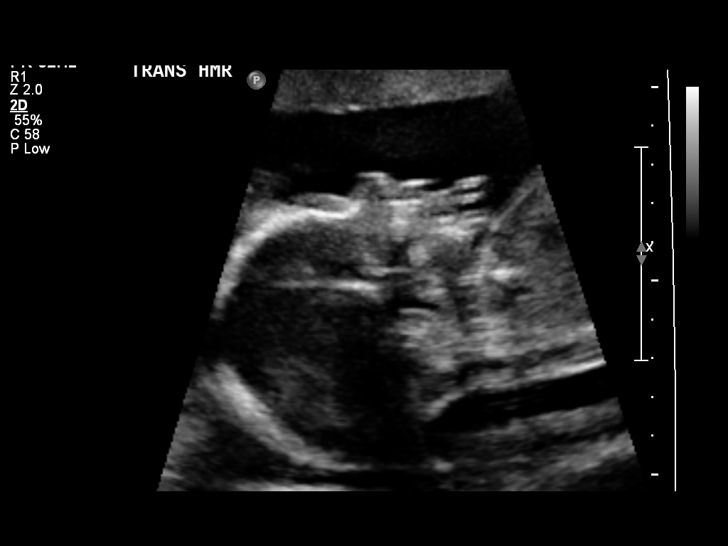
[im 26/88]
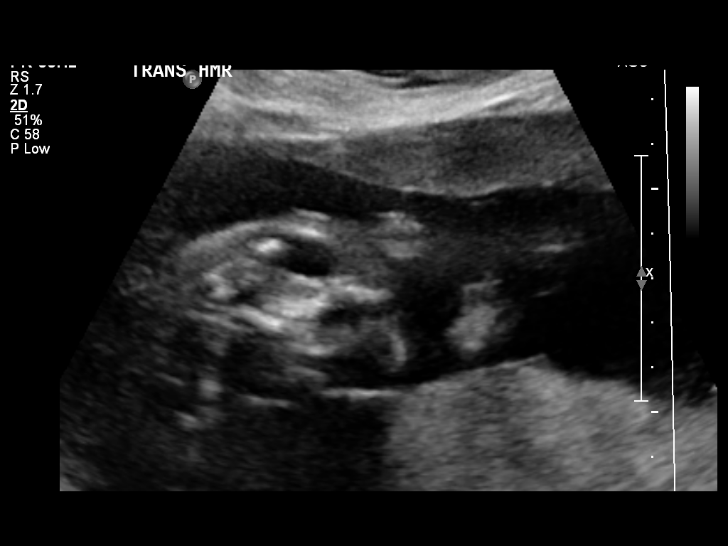
[im 33/88]
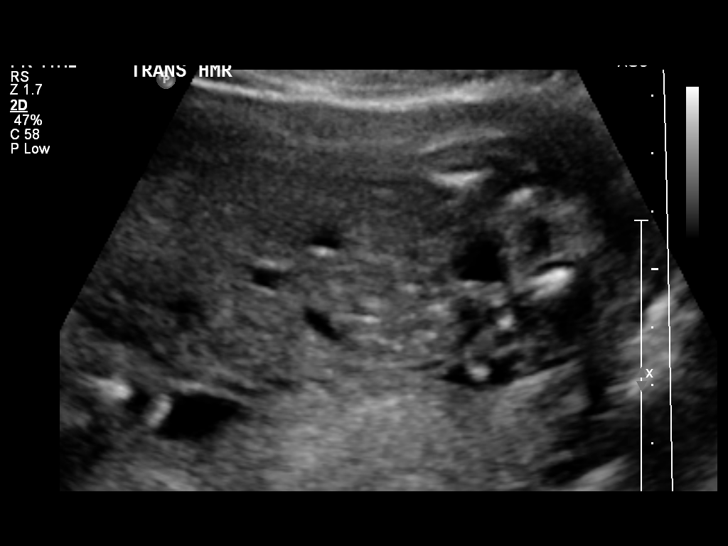
[im 39/88]
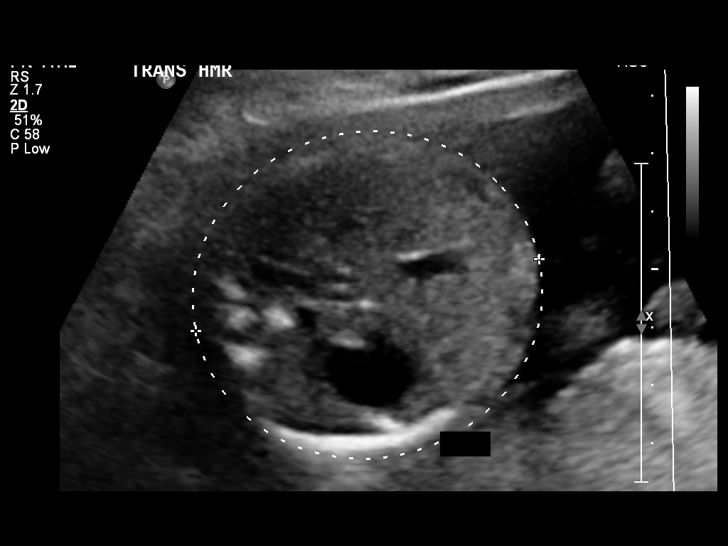
[im 49/88]
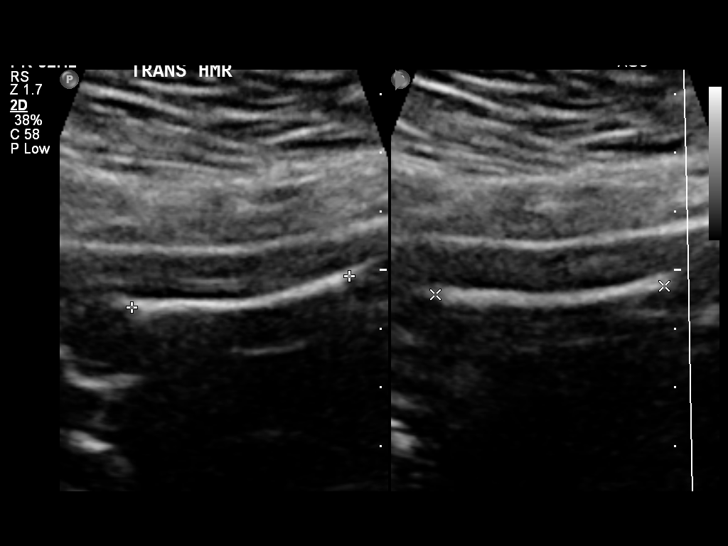
[im 55/88]
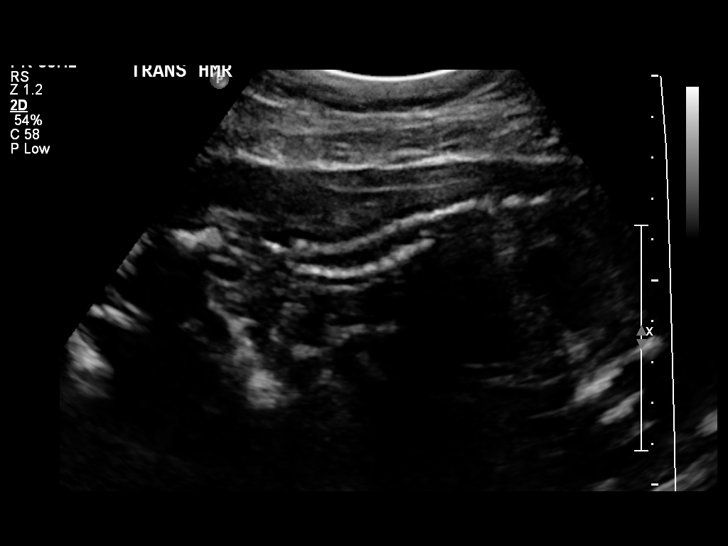
[im 62/88]
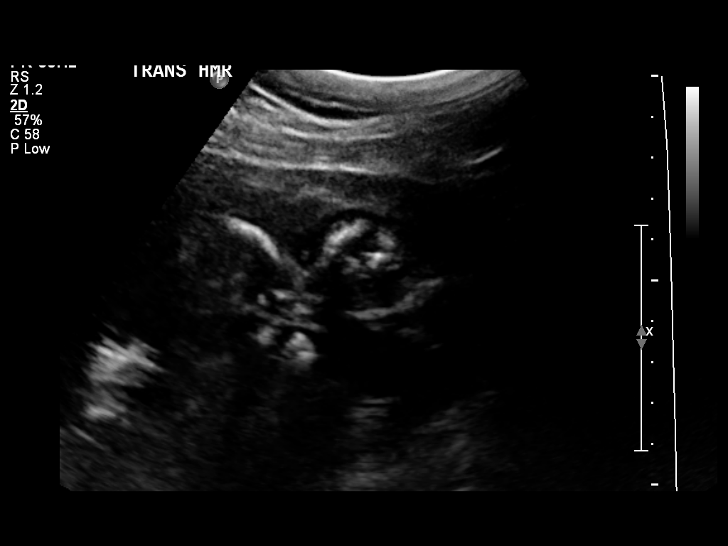
[im 71/88]
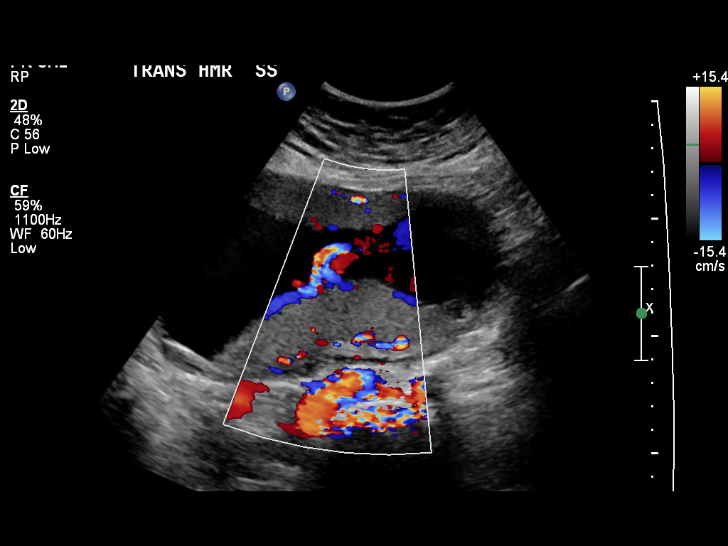
[im 78/88]
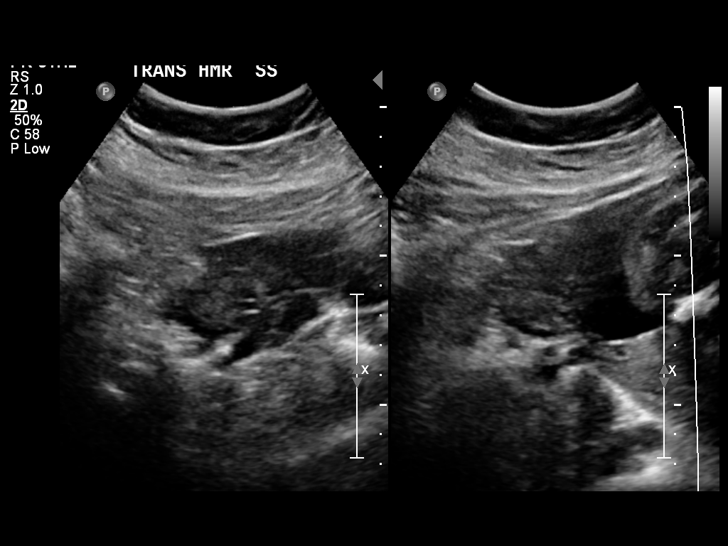
[im 84/88]
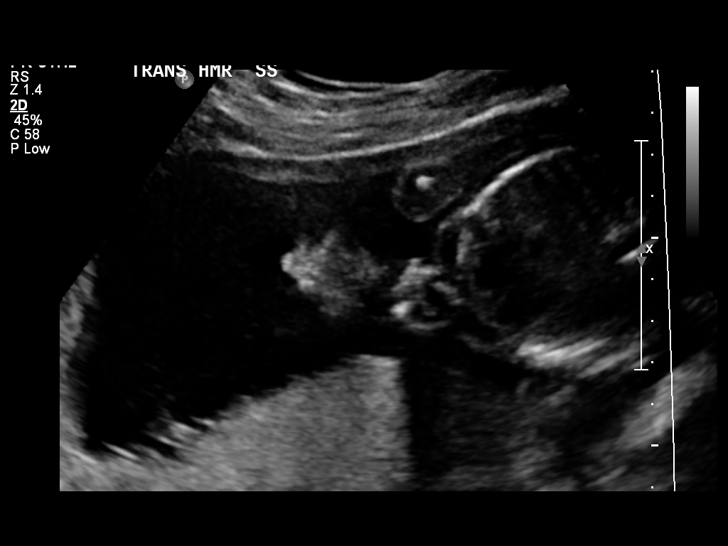

[12 of 28 positions shown; findings below may reference images not displayed]

OBSTETRICS REPORT
(Signed Final 06/03/2014 [DATE])

Service(s) Provided

US OB DETAIL + 14 WK                                  76811.0
Indications

Detailed fetal anatomic survey                        Z36
Advanced maternal age multigravida (34) second
trimester
Hypertension - Chronic/Pre-existing (labetalol)
Poor obstetric history: Previous gestational
diabetes
Obesity complicating pregnancy, second trimester
Fetal Evaluation

Num Of Fetuses:    1
Fetal Heart Rate:  147                          bpm
Cardiac Activity:  Observed
Presentation:      Transverse, head to
maternal right
Placenta:          Posterior Fundal, above
cervical os
P. Cord            Visualized, central
Insertion:

Amniotic Fluid
AFI FV:      Subjectively within normal limits
Larg Pckt:    7.47  cm
Biometry

BPD:       56  mm     G. Age:  23w 1d                CI:        71.96   70 - 86
FL/HC:      20.6   19.2 -
20.8
HC:     210.1  mm     G. Age:  23w 1d       22  %    HC/AC:      1.14   1.05 -
1.21
AC:     184.2  mm     G. Age:  23w 2d       36  %    FL/BPD:     77.1   71 - 87
FL:      43.2  mm     G. Age:  24w 1d       62  %    FL/AC:      23.5   20 - 24
HUM:     38.3  mm     G. Age:  23w 4d       44  %
CER:     22.4  mm     G. Age:  21w 1d      < 5  %

Est. FW:     606  gm      1 lb 5 oz     55  %
Gestational Age

LMP:           20w 0d        Date:  01/14/14                 EDD:   10/21/14
U/S Today:     23w 3d                                        EDD:   09/27/14
Best:          23w 3d     Det. By:  U/S (06/03/14)           EDD:   09/27/14
Anatomy

Cranium:          Appears normal         Aortic Arch:      Not well visualized
Fetal Cavum:      Appears normal         Ductal Arch:      Not well visualized
Ventricles:       Appears normal         Diaphragm:        Appears normal
Choroid Plexus:   Appears normal         Stomach:          Appears normal, left
sided
Cerebellum:       Appears normal         Abdomen:          Appears normal
Posterior Fossa:  Appears normal         Abdominal Wall:   Appears nml (cord
insert, abd wall)
Nuchal Fold:      Not applicable (>20    Cord Vessels:     Appears normal (3
wks GA)                                  vessel cord)
Face:             Appears normal         Kidneys:          Appear normal
(orbits and profile)
Lips:             Appears normal         Bladder:          Appears normal
Heart:            Appears normal         Spine:            Limited views
(4CH, axis, and                          appear normal
situs)
RVOT:             Appears normal         Lower             Appears normal
Extremities:
LVOT:             Appears normal         Upper             Appears normal
Extremities:

Other:  Fetus appears to be a male. Heels visualized. Left 5th digit visualized.
Nasal bone visualized. Technically difficult due to maternal habitus
and fetal position.
Targeted Anatomy

Fetal Central Nervous System
Cisterna Magna:
Cervix Uterus Adnexa

Cervical Length:    3.9      cm

Cervix:       Normal appearance by transabdominal scan.
Uterus:       No abnormality visualized.
Cul De Sac:   No free fluid seen.
Left Ovary:    Within normal limits.
Right Ovary:   Within normal limits.

Adnexa:     No abnormality visualized.
Impression

Single IUP at 23w 3d
Normal fetal anatomic survey
Somewhat limited views of the fetal heart were obtained
(Aortic and ductal arches)
No markers associated with aneuploidy noted
Posterior fundal placenta
Normal amniotic fluid volume
Recommendations

Recommend follow-up ultrasound examination in 4 weeks to
reevaluate the fetal heart

## 2016-11-25 ENCOUNTER — Encounter: Payer: Self-pay | Admitting: Family Medicine

## 2016-11-25 ENCOUNTER — Ambulatory Visit (INDEPENDENT_AMBULATORY_CARE_PROVIDER_SITE_OTHER): Payer: Medicaid Other | Admitting: Family Medicine

## 2016-11-25 DIAGNOSIS — M25511 Pain in right shoulder: Secondary | ICD-10-CM

## 2016-11-25 DIAGNOSIS — M25519 Pain in unspecified shoulder: Secondary | ICD-10-CM | POA: Insufficient documentation

## 2016-11-25 MED ORDER — CYCLOBENZAPRINE HCL 10 MG PO TABS: 10.0000 mg | ORAL_TABLET | Freq: Every day | ORAL | 0 refills | Status: DC

## 2016-11-25 NOTE — Progress Notes (Signed)
   Subjective:   Patient ID: Kendra Harris    DOB: 06/05/1980, 36 y.o. female   MRN: [id number]  CC: R shoulder pain   Right shoulder pain, acute  -Symptoms persistent x 3 days, gradual onset  -Feels like she may have pulled something when picking her 37 year old daughter up. Does not recall lifting anything heavier.   No history of injury to the shoulder.  Denies trauma and falls.  -Reports pain as a dull aching across shoulder and neck, difficult to move her arm much  -has been taking Ibuprofen, Tylenol, using ice and heat but with minimal improvement.   -Is R hand dominant but has been using the L arm more  -denies weakness in her hands  -No numbness or tingling in fingertips -Had similar symptoms about a year or two ago and resolved    ROS: Denies fevers, chills, nausea, vomiting, dizziness, headache.  PMFSH: Pertinent past medical, surgical, family, and social history were reviewed and updated as appropriate. Smoking status reviewed. Medications reviewed.   Objective:   BP (!) 142/98   Pulse 64   Temp 98.4 F (36.9 C) (Oral)   Wt 217 lb (98.4 kg)   SpO2 96%   BMI 39.69 kg/m  Vitals and nursing note reviewed.  General: 36 yo female, NAD  HEENT: NCAT, EOMI, PERRL  Neck: supple, normal ROM  CV: RRR no MRG  Lungs: CTAB, non-laboured  Abdomen: soft, NTND, +bs  Skin: warm, dry, no rashes   Extremities:  R shoulder -  Inspection reveals no abnormalities, atrophy or asymmetry.  Palpation is normal with no tenderness over AC joint or bicipital groove. ROM is decreased with abduction of the arm past 90 degrees  Rotator cuff strength normal throughout. No signs of impingement with negative Neer and Hawkin's tests, empty can. Negative Speed's test., negative O'Briens Normal scapular function observed. No painful arc and no drop arm sign.  Neuro: grip strength 5/5 bilaterally in upper ext, normal sensation   Assessment & Plan:   Shoulder pain, right Likely 2/2 muscle  spasm of trapezius given decreased ROM and otherwise normal shoulder exam without signs of impingement or RTC pathology.   -Rx for short course of Flexeril for 7-10 days and anticipate this will resolve over the next week - caution with driving due to drowsiness/sedation  -Advise applying heat to the neck and shoulder area  -May benefit from topical Icy Hot  -Return precautions discussed  Meds ordered this encounter  Medications  . cyclobenzaprine (FLEXERIL) 10 MG tablet    Sig: Take 1 tablet (10 mg total) by mouth daily.    Dispense:  10 tablet    Refill:  0   Follow up: if no improvement   Freddrick MarchYashika Tomothy Eddins, MD [facility], PGY-2 11/25/2016 6:30 PM

## 2016-11-25 NOTE — Assessment & Plan Note (Addendum)
Likely 2/2 muscle spasm of trapezius given decreased ROM and otherwise normal shoulder exam without signs of impingement or RTC pathology.   -Rx for short course of Flexeril for 7-10 days and anticipate this will resolve over the next week - caution with driving due to drowsiness/sedation  -Advise applying heat to the neck and shoulder area  -May benefit from topical Icy Hot  -Return precautions discussed

## 2016-11-25 NOTE — Patient Instructions (Addendum)
It was nice meeting you ! You were seen in clinic today for right shoulder/neck pain which is likely due to a muscle spasm.  As we discussed,  I have prescribed a short course of Flexeril which should help with your symptoms.   I would advise you to be careful with driving as this is a sedating medication.  Additionally, I would like you to apply heat and use IcyHot during the day as these should also help.  If you have worsening symptoms or develop weakness or numbness/tingling in your fingers, I would like for you to come back in and be seen. Please call clinic if you have any questions.   Be well, Kendra MarchYashika Jerriah Ines, MD

## 2016-12-03 ENCOUNTER — Ambulatory Visit (HOSPITAL_COMMUNITY)
Admission: EM | Admit: 2016-12-03 | Discharge: 2016-12-03 | Disposition: A | Discharge status: Home / Self Care | Payer: Medicaid Other | Attending: Emergency Medicine | Admitting: Emergency Medicine

## 2016-12-03 ENCOUNTER — Ambulatory Visit: Payer: Medicaid Other | Admitting: Family Medicine

## 2016-12-03 ENCOUNTER — Encounter (HOSPITAL_COMMUNITY): Payer: Self-pay | Admitting: *Deleted

## 2016-12-03 DIAGNOSIS — N3090 Cystitis, unspecified without hematuria: Secondary | ICD-10-CM | POA: Diagnosis not present

## 2016-12-03 DIAGNOSIS — R109 Unspecified abdominal pain: Secondary | ICD-10-CM | POA: Diagnosis not present

## 2016-12-03 DIAGNOSIS — R358 Other polyuria: Secondary | ICD-10-CM | POA: Diagnosis present

## 2016-12-03 LAB — POCT URINALYSIS DIP (DEVICE)
GLUCOSE, UA: NEGATIVE mg/dL
KETONES UR: NEGATIVE mg/dL
LEUKOCYTES UA: NEGATIVE
Nitrite: NEGATIVE
Protein, ur: 100 mg/dL — AB
Urobilinogen, UA: 1 mg/dL (ref 0.0–1.0)
pH: 6 (ref 5.0–8.0)

## 2016-12-03 MED ORDER — SULFAMETHOXAZOLE-TRIMETHOPRIM 800-160 MG PO TABS: 1.0000 | ORAL_TABLET | Freq: Two times a day (BID) | ORAL | 0 refills | Status: AC

## 2016-12-03 NOTE — Discharge Instructions (Signed)
Your urine was positive for an urinary tract infection. Start Bactrim as directed. Keep hydrated, your urine should be clear to pale yellow in color. Monitor for any worsening of symptoms, fever, worsening abdominal pain, nausea/vomiting, flank pain, follow up for reevaluation.   Symptoms could also be due to muscle strain. Take ibuprofen/tylenol for pain as needed.

## 2016-12-03 NOTE — ED Provider Notes (Signed)
[facility]    CSN: [id number] Arrival date & time: 12/03/16  1135     History   Chief Complaint Chief Complaint  Patient presents with  . Flank Pain  . Polyuria    HPI Kendra Harris is a 36 y.o. female.   36 year old female with history of HTN, gestational diabetes comes in for 3-4 day history of left flank pain and polyuria. Denies dysuria. Started her menstrual cycle today. Denies abdominal pain, nausea, vomiting. Denies fever, chills, night sweats. She states polyuria could also be due to increased liquid intake. She states she first started noticing left flank/low back pain that is intermittent and describes as dull pain. Last time she experienced similar symptoms, she was diagnosed with cystitis, therefore she started increase her liquid intake. She does have to pick up her 30 year old daughter on a daily basis, otherwise no significant activity. Denies association of pain with movement.       Past Medical History:  Diagnosis Date  . DELAYED MENSES 02/11/2010   Qualifier: Diagnosis of  By: Wallene Huh  MD, Rande Lawman    . Gestational diabetes   . Hypertension   . Urinary tract infection 03/04/2011    Patient Active Problem List   Diagnosis Date Noted  . Shoulder pain, right 01/24/2014  . Menorrhagia 04/27/2013  . Severe major depression with psychotic features (HCC) 07/24/2010  . MIGRAINE HEADACHE 12/13/2007  . Essential hypertension, benign 08/31/2007  . Morbid obesity with BMI of 45.0-49.9, adult (HCC) 04/07/2006    Past Surgical History:  Procedure Laterality Date  . NO PAST SURGERIES      OB History    Gravida Para Term Preterm AB Living   5 5 5  0 0 5   SAB TAB Ectopic Multiple Live Births   0 0 0 0 5       Home Medications    Prior to Admission medications   Medication Sig Start Date End Date Taking? Authorizing Provider  amLODipine (NORVASC) 10 MG tablet Take 1 tablet (10 mg total) by mouth daily. 11/05/15  Yes Lorne Skeens, MD    hydrochlorothiazide (HYDRODIURIL) 25 MG tablet Take 1 tablet (25 mg total) by mouth daily. 11/05/15  Yes Lorne Skeens, MD  acetaminophen (TYLENOL) 325 MG tablet Take 2 tablets (650 mg total) by mouth every 4 (four) hours as needed (for pain scale < 4). 11/05/15   Lorne Skeens, MD  sulfamethoxazole-trimethoprim (BACTRIM DS,SEPTRA DS) 800-160 MG tablet Take 1 tablet by mouth 2 (two) times daily. 12/03/16 12/06/16  Belinda Fisher, PA-C    Family History Family History  Problem Relation Age of Onset  . Sickle cell trait Sister     Social History Social History  Substance Use Topics  . Smoking status: Never Smoker  . Smokeless tobacco: Never Used  . Alcohol use No     Allergies   Patient has no known allergies.   Review of Systems Review of Systems  Reason unable to perform ROS: See HPI as above.     Physical Exam Triage Vital Signs ED Triage Vitals  Enc Vitals Group     BP      Pulse      Resp      Temp      Temp src      SpO2      Weight      Height      Head Circumference      Peak Flow  Pain Score      Pain Loc      Pain Edu?      Excl. in GC?    No data found.   Updated Vital Signs BP (!) 150/78   Pulse 70   Temp 98 F (36.7 C) (Oral)   Resp 17   LMP 12/03/2016   SpO2 97%   Physical Exam  Constitutional: She is oriented to person, place, and time. She appears well-developed and well-nourished. No distress.  Eyes: Pupils are equal, round, and reactive to light. Conjunctivae are normal.  Cardiovascular: Normal rate, regular rhythm and normal heart sounds.  Exam reveals no gallop and no friction rub.   No murmur heard. Pulmonary/Chest: Effort normal and breath sounds normal. She has no wheezes. She has no rales.  Abdominal: Soft. Bowel sounds are normal. She exhibits no distension. There is no tenderness. There is no rebound, no guarding and no CVA tenderness.  Musculoskeletal:  No tenderness on palpation of back/hip. Full ROM.  Strength normal and equal bilaterally. Sensation intact.   Neurological: She is alert and oriented to person, place, and time.  Skin: Skin is warm and dry.     UC Treatments / Results  Labs (all labs ordered are listed, but only abnormal results are displayed) Labs Reviewed  POCT URINALYSIS DIP (DEVICE) - Abnormal; Notable for the following:       Result Value   Bilirubin Urine SMALL (*)    Hgb urine dipstick LARGE (*)    Protein, ur 100 (*)    All other components within normal limits  URINE CULTURE    EKG  EKG Interpretation None       Radiology No results found.  Procedures Procedures (including critical care time)  Medications Ordered in UC Medications - No data to display   Initial Impression / Assessment and Plan / UC Course  I have reviewed the triage vital signs and the nursing notes.  Pertinent labs & imaging results that were available during my care of the patient were reviewed by me and considered in my medical decision making (see chart for details).    Manual read on urine dipstick positive for leukocytes. Given history and exam, will treat for UTI. Urine culture sent. Start antibiotics as directed. Push fluids. Return precautions given.   Final Clinical Impressions(s) / UC Diagnoses   Final diagnoses:  Cystitis    New Prescriptions Discharge Medication List as of 12/03/2016 12:15 PM    START taking these medications   Details  sulfamethoxazole-trimethoprim (BACTRIM DS,SEPTRA DS) 800-160 MG tablet Take 1 tablet by mouth 2 (two) times daily., Starting Fri 12/03/2016, Until Mon 12/06/2016, Normal          Carson Bogden V, PA-C 12/03/16 1259

## 2016-12-03 NOTE — ED Triage Notes (Signed)
Patient reports 3-4 day history of left flank pain and polyuria. Denies dysuria.

## 2016-12-05 LAB — URINE CULTURE

## 2016-12-16 ENCOUNTER — Telehealth: Payer: Self-pay

## 2016-12-16 MED ORDER — AMLODIPINE BESYLATE 10 MG PO TABS: 10.0000 mg | ORAL_TABLET | Freq: Every day | ORAL | 2 refills | Status: DC

## 2016-12-16 NOTE — Telephone Encounter (Signed)
Please send a script for amlodipine to pharmacy on file.Kendra Harris, Michelle R

## 2017-03-08 IMAGING — US US OB FOLLOW-UP
1 series · 12 of 28 positions shown · non-contrast
Comparison: none

OBSTETRICS REPORT
(Signed Final 09/16/2014 [DATE])

Date:
Service(s) Provided
US OB FOLLOW UP                                        76816.1
Indications
Hypertension - Chronic/Pre-existing (labetalol)
Poor obstetric history: Previous gestational
diabetes
Obesity complicating pregnancy, third trimester
Advanced maternal age multigravida 35+, third
trimester
Fetal Evaluation
Num Of             1
Fetuses:
Fetal Heart        130                          bpm
Rate:
Cardiac Activity:  Observed
Presentation:      Cephalic
Placenta:          Posterior Fundal, above
cervical os
P. Cord            Previously seen as
Insertion:         normal
Amniotic Fluid
AFI FV:      Subjectively within normal limits
AFI Sum:     21.4     cm      87  %Tile     Larg Pckt:    7.81   cm
RUQ:   5.08    cm    RLQ:   5.68    cm   LUQ:    7.81    cm   LLQ:    2.83   cm
Biometry
BPD:       91   m    G. Age:   36w 6d                 CI:         75.5   70 - 86
m
FL/HC:      21.9   20.9 -
22.7
HC:     332.1   m    G. Age:   37w 6d        22  %    HC/AC:      0.98   0.92 -
AC:     337.6   m    G. Age:   37w 5d        48  %    FL/BPD      80.0   71 - 87
m                                     :
FL:      72.8   m    G. Age:   37w 2d        26  %    FL/AC:      21.6   20 - 24
HUM:     66.7   m    G. Age:   38w 5d        82  %
Est.        4773   gm    7 lb 2 oz      62   %
FW:
Gestational Age
LMP:           35w 0d        Date:  01/14/14                  EDD:   10/21/14
U/S Today:     37w 3d                                         EDD:   10/04/14
Best:          38w 3d    Det. By:   U/S (06/03/14)            EDD:   09/27/14
Anatomy
Cranium:          Appears normal         Aortic Arch:       Not well visualized
Fetal Cavum:      Previously seen        Ductal Arch:       Not well visualized
Ventricles:       Appears normal         Diaphragm:         Previously seen
Choroid Plexus:   Previously seen        Stomach:           Appears normal,
left sided
Cerebellum:       Previously seen        Abdomen:           Previously seen
Posterior         Previously seen        Abdominal          Previously seen
Fossa:                                   Wall:
Nuchal Fold:      Not applicable (>20    Cord Vessels:      Previously seen
wks GA)
Face:             Orbits and profile     Kidneys:           Appear normal
previously seen
Lips:             Previously seen        Bladder:           Appears normal
Palate:           Previously seen        Spine:             Previously seen
Heart:            Previously seen        Lower              Previously seen
Extremities:
RVOT:             Previously seen        Upper              Previously seen
LVOT:             Previously seen
Other:   Male gender.  Heels and Left 5th digit previously seen.
Targeted Anatomy
Fetal Central Nervous System
Lat. Ventricles:
Cervix Uterus Adnexa
Cervical Length:    3.8       cm
Cervix:       Normal appearance by transabdominal scan.
Left Ovary:    Previously seen.
Right Ovary:   Previously seen
Impression
INDICATION: 34 yr old [id number] at 91w9d with chronic
hypertension for fetal growth. Remote read.

[Series 1: us ob follow up · 12 of 43 slices shown]
[im 2/43]
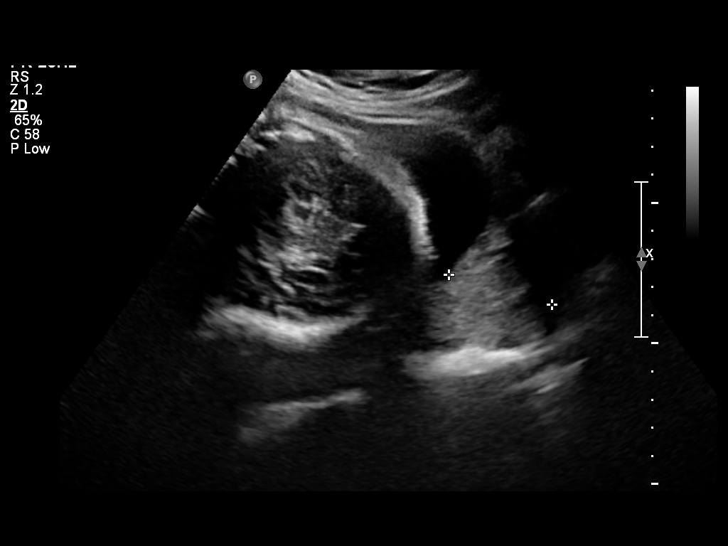
[im 5/43]
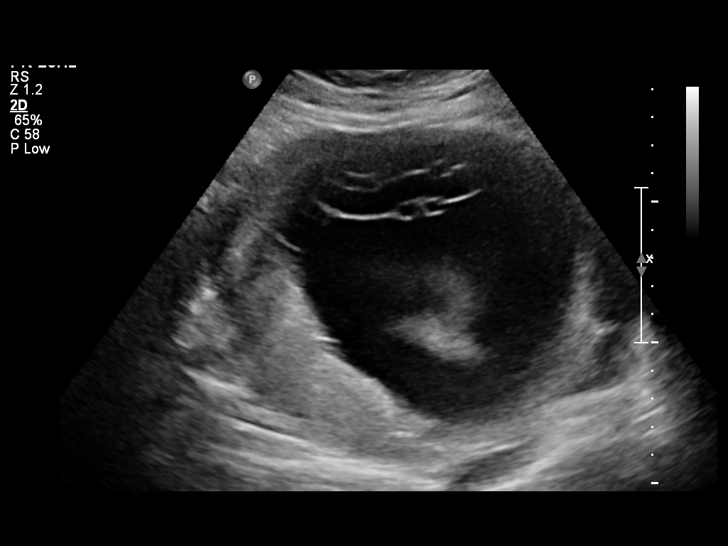
[im 8/43]
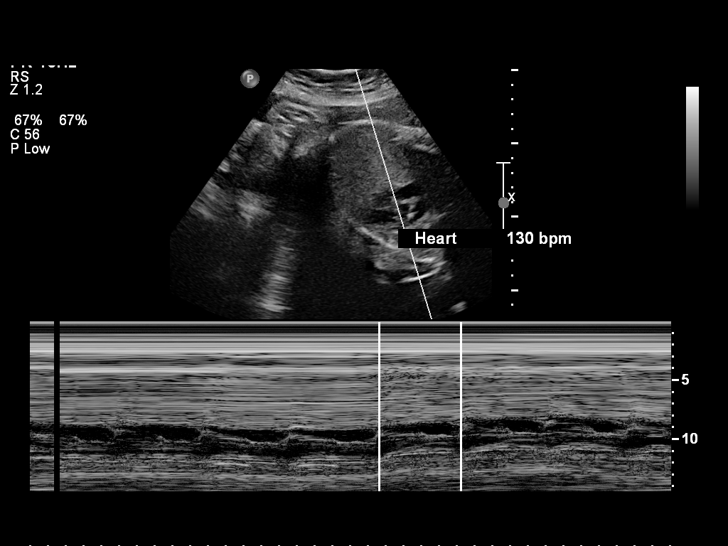
[im 13/43]
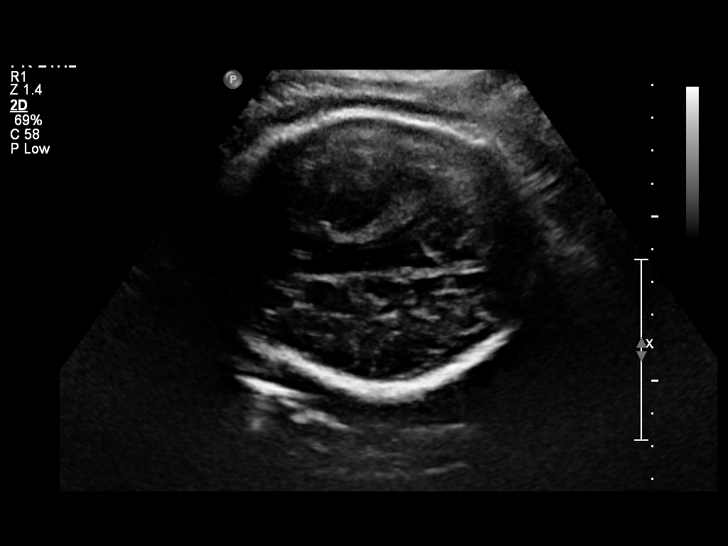
[im 16/43]
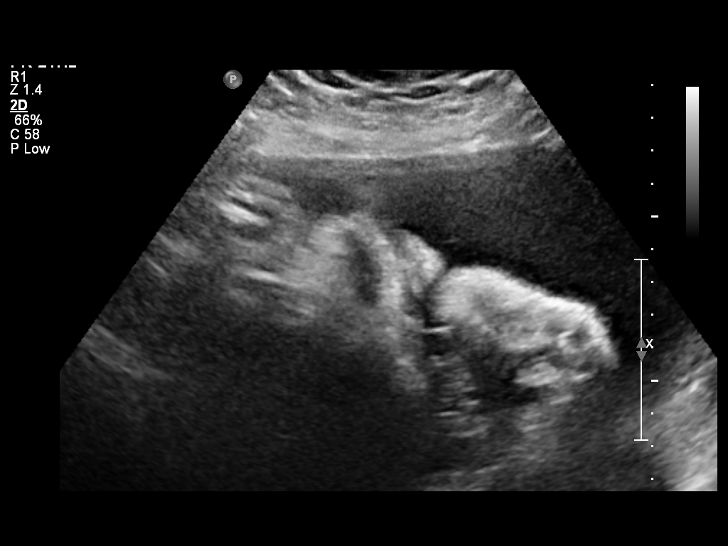
[im 19/43]
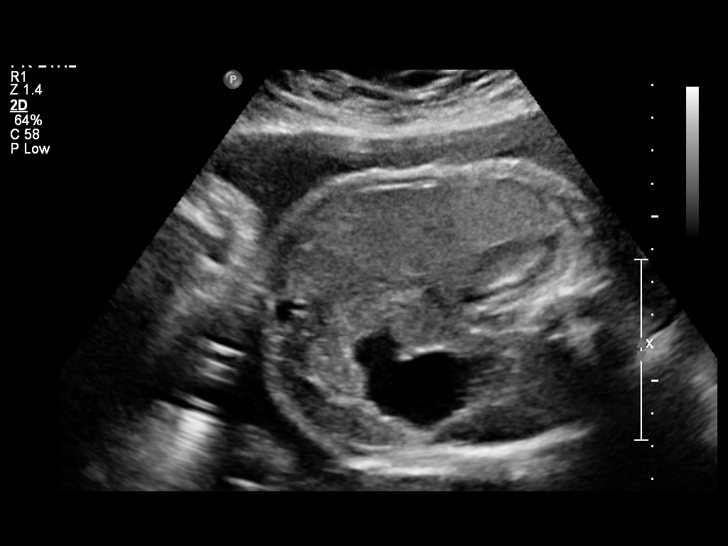
[im 24/43]
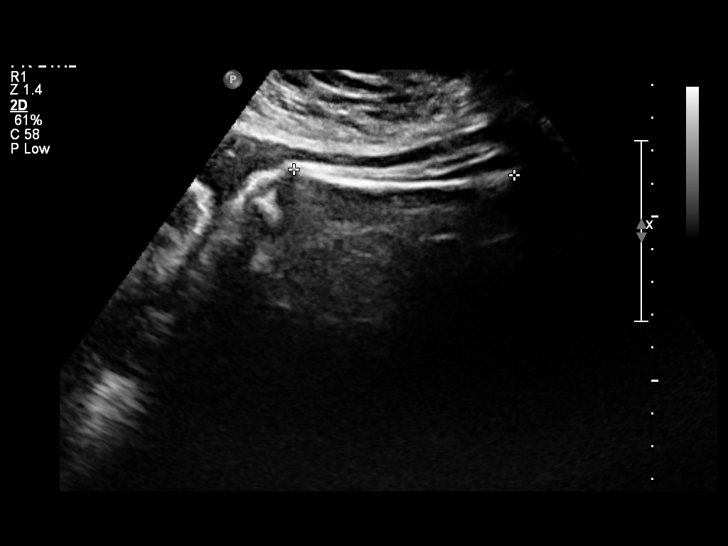
[im 27/43]
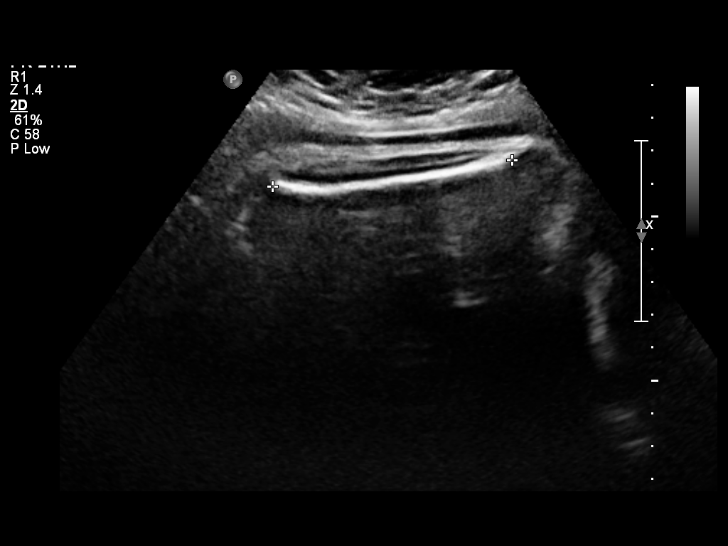
[im 30/43]
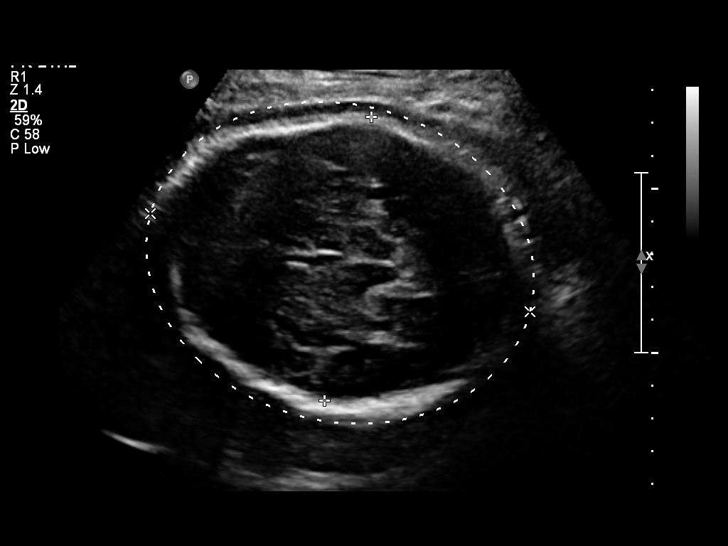
[im 35/43]
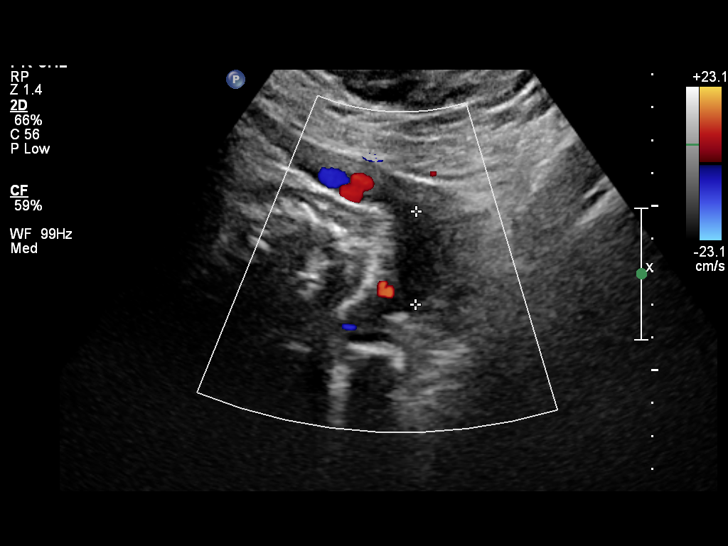
[im 38/43]
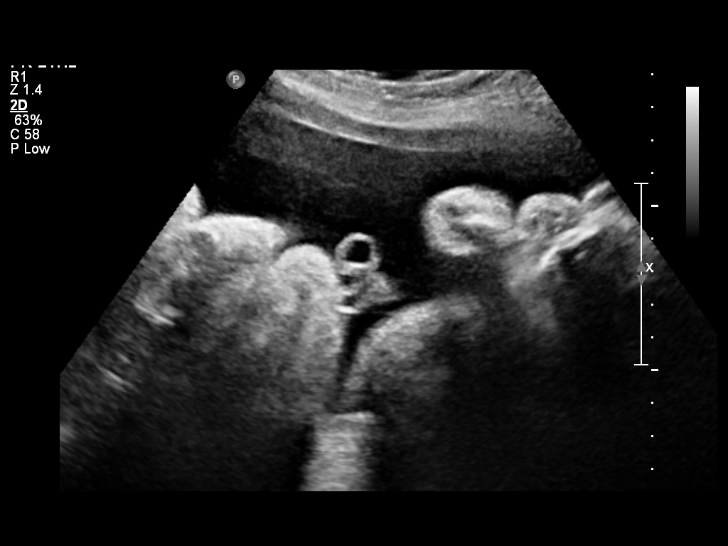
[im 41/43]
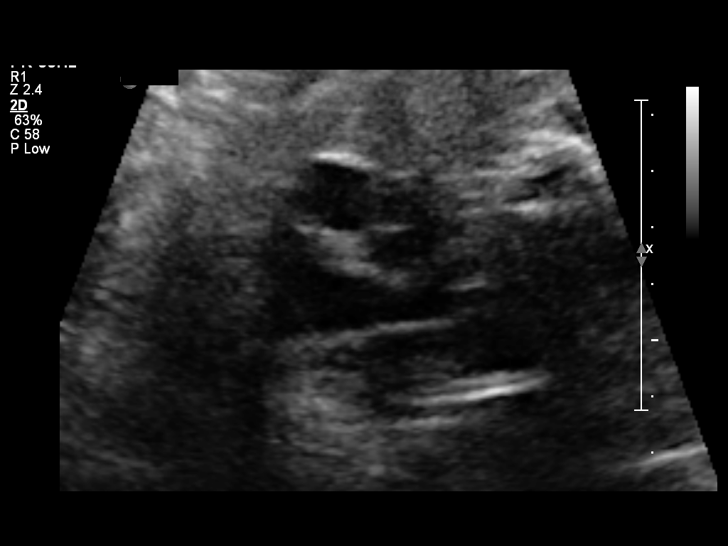

[12 of 28 positions shown; findings below may reference images not displayed]

FINDINGS: 1. Single intrauterine pregnancy.
2. Estimated fetal weight is in the 62nd%.
3. Posterior fundal placenta without evidence of previa.
4. Normal amniotic fluid index.
5. The limited anatomy survey is normal.
Recommendations

1. Appropriate fetal growth.
2. Hypertension:
- recommend continue antenatal testing
- recommend close surveillance for the development of
signs/symptoms of preeclampsia
3. Advanced maternal age:
- low risk quad screen

ERXLEBEN with us.  Please do not hesitate to contact

## 2017-07-25 ENCOUNTER — Encounter (HOSPITAL_COMMUNITY): Payer: Self-pay

## 2017-07-25 ENCOUNTER — Emergency Department (HOSPITAL_COMMUNITY)
Admission: EM | Admit: 2017-07-25 | Discharge: 2017-07-25 | Disposition: A | Discharge status: Home / Self Care | Payer: Medicaid Other | Attending: Emergency Medicine | Admitting: Emergency Medicine

## 2017-07-25 ENCOUNTER — Emergency Department (HOSPITAL_COMMUNITY): Payer: Medicaid Other

## 2017-07-25 ENCOUNTER — Other Ambulatory Visit: Payer: Self-pay

## 2017-07-25 DIAGNOSIS — M25512 Pain in left shoulder: Secondary | ICD-10-CM | POA: Insufficient documentation

## 2017-07-25 DIAGNOSIS — Z79899 Other long term (current) drug therapy: Secondary | ICD-10-CM | POA: Insufficient documentation

## 2017-07-25 DIAGNOSIS — I10 Essential (primary) hypertension: Secondary | ICD-10-CM | POA: Diagnosis not present

## 2017-07-25 MED ORDER — NAPROXEN 500 MG PO TABS: 500.0000 mg | ORAL_TABLET | Freq: Two times a day (BID) | ORAL | 0 refills | Status: DC | PRN

## 2017-07-25 MED ORDER — TRAMADOL HCL 50 MG PO TABS: 50.0000 mg | ORAL_TABLET | Freq: Four times a day (QID) | ORAL | 0 refills | Status: DC | PRN

## 2017-07-25 NOTE — ED Triage Notes (Signed)
Pt reports that since last night she has been having L shoulder pain, denies injury or any other symptoms.

## 2017-07-25 NOTE — ED Provider Notes (Signed)
[facility] EMERGENCY DEPARTMENT Provider Note   CSN: [id number] Arrival date & time: 07/25/17  4098     History   Chief Complaint Chief Complaint  Patient presents with  . Shoulder Pain    HPI Kendra Harris is a 37 y.o. female.  Patient presents to emergency department for evaluation of left shoulder pain.  Patient reports that the pain started yesterday.  She does, however, reports that she has intermittent pain in both of her shoulders occasionally.  She reports that she has seen her doctor for this and has been given medication sometimes that helps, but she is not sure what the medicine is.  She has been taking Tylenol without relief.  She has not had any injury.     Past Medical History:  Diagnosis Date  . DELAYED MENSES 02/11/2010   Qualifier: Diagnosis of  By: Wallene Huh  MD, Rande Lawman    . Gestational diabetes   . Hypertension   . Urinary tract infection 03/04/2011    Patient Active Problem List   Diagnosis Date Noted  . Shoulder pain, right 01/24/2014  . Menorrhagia 04/27/2013  . Severe major depression with psychotic features (HCC) 07/24/2010  . MIGRAINE HEADACHE 12/13/2007  . Essential hypertension, benign 08/31/2007  . Morbid obesity with BMI of 45.0-49.9, adult (HCC) 04/07/2006    Past Surgical History:  Procedure Laterality Date  . NO PAST SURGERIES       OB History    Gravida  5   Para  5   Term  5   Preterm  0   AB  0   Living  5     SAB  0   TAB  0   Ectopic  0   Multiple  0   Live Births  5            Home Medications    Prior to Admission medications   Medication Sig Start Date End Date Taking? Authorizing Provider  acetaminophen (TYLENOL) 325 MG tablet Take 2 tablets (650 mg total) by mouth every 4 (four) hours as needed (for pain scale < 4). 11/05/15   Lorne Skeens, MD  amLODipine (NORVASC) 10 MG tablet Take 1 tablet (10 mg total) daily by mouth. 12/16/16   Mikell, Antionette Poles, MD    hydrochlorothiazide (HYDRODIURIL) 25 MG tablet Take 1 tablet (25 mg total) by mouth daily. 11/05/15   Lorne Skeens, MD  naproxen (NAPROSYN) 500 MG tablet Take 1 tablet (500 mg total) by mouth 2 (two) times daily as needed for moderate pain. 07/25/17   Gilda Crease, MD  traMADol (ULTRAM) 50 MG tablet Take 1 tablet (50 mg total) by mouth every 6 (six) hours as needed. 07/25/17   Gilda Crease, MD    Family History Family History  Problem Relation Age of Onset  . Sickle cell trait Sister     Social History Social History   Tobacco Use  . Smoking status: Never Smoker  . Smokeless tobacco: Never Used  Substance Use Topics  . Alcohol use: No  . Drug use: No     Allergies   Patient has no known allergies.   Review of Systems Review of Systems  Musculoskeletal: Positive for arthralgias.  All other systems reviewed and are negative.    Physical Exam Updated Vital Signs BP (!) 228/117   Pulse 64   Temp 98 F (36.7 C) (Oral)   LMP 07/08/2017   SpO2 100%   Physical Exam  Constitutional: She is oriented to person, place, and time. She appears well-developed and well-nourished. No distress.  HENT:  Head: Normocephalic and atraumatic.  Right Ear: Hearing normal.  Left Ear: Hearing normal.  Nose: Nose normal.  Mouth/Throat: Oropharynx is clear and moist and mucous membranes are normal.  Eyes: Pupils are equal, round, and reactive to light. Conjunctivae and EOM are normal.  Neck: Normal range of motion. Neck supple.  Cardiovascular: Regular rhythm, S1 normal and S2 normal. Exam reveals no gallop and no friction rub.  No murmur heard. Pulmonary/Chest: Effort normal and breath sounds normal. No respiratory distress. She exhibits no tenderness.  Abdominal: Soft. Normal appearance and bowel sounds are normal. There is no hepatosplenomegaly. There is no tenderness. There is no rebound, no guarding, no tenderness at McBurney's point and negative Murphy's  sign. No hernia.  Musculoskeletal: Normal range of motion.       Left shoulder: She exhibits tenderness. She exhibits normal range of motion and no deformity.       Arms: Neurological: She is alert and oriented to person, place, and time. She has normal strength. No cranial nerve deficit or sensory deficit. Coordination normal. GCS eye subscore is 4. GCS verbal subscore is 5. GCS motor subscore is 6.  Skin: Skin is warm, dry and intact. No rash noted. No cyanosis.  Psychiatric: She has a normal mood and affect. Her speech is normal and behavior is normal. Thought content normal.  Nursing note and vitals reviewed.    ED Treatments / Results  Labs (all labs ordered are listed, but only abnormal results are displayed) Labs Reviewed - No data to display  EKG None  Radiology Dg Shoulder Left  Result Date: 07/25/2017 CLINICAL DATA:  Pain in the left shoulder off and on for 2 months. No injury. EXAM: LEFT SHOULDER - 2+ VIEW COMPARISON:  None. FINDINGS: There is no evidence of fracture or dislocation. There is no evidence of arthropathy or other focal bone abnormality. Soft tissues are unremarkable. IMPRESSION: Negative. Electronically Signed   By: Burman NievesWilliam  Stevens M.D.   On: 07/25/2017 06:20    Procedures Procedures (including critical care time)  Medications Ordered in ED Medications - No data to display   Initial Impression / Assessment and Plan / ED Course  I have reviewed the triage vital signs and the nursing notes.  Pertinent labs & imaging results that were available during my care of the patient were reviewed by me and considered in my medical decision making (see chart for details).     Patient presents to the ER for evaluation of nontraumatic left shoulder pain.  She reports that she has had intermittent pains like this in the past.  Examination reveals tenderness over the posterior superior aspect of the shoulder with decreased range of motion secondary to pain.  No  deformity.  X-ray unremarkable.  She does not have tenderness in the subacromial space or biceps tendon.  Likely inflammatory, possibly rotator cuff tendinitis, less likely rotator cuff injury.  Treat with analgesia, intermittent sling for comfort, follow-up with orthopedics.  Patient found to be hypertensive at arrival.  She has a history of hypertension.  She reports that she forgot to take her medication this morning.  She has not run out of her meds.  Patient is asymptomatic, no headache, blurred vision, chest pain, shortness of breath.  She was instructed that she needs to take her medicine as soon as she gets home.  Final Clinical Impressions(s) / ED Diagnoses   Final  diagnoses:  Acute pain of left shoulder    ED Discharge Orders        Ordered    traMADol (ULTRAM) 50 MG tablet  Every 6 hours PRN     07/25/17 0651    naproxen (NAPROSYN) 500 MG tablet  2 times daily PRN     07/25/17 0651       Gilda Crease, MD 07/25/17 [phone]

## 2017-10-14 ENCOUNTER — Other Ambulatory Visit: Payer: Self-pay | Admitting: Family Medicine

## 2017-10-14 NOTE — Telephone Encounter (Signed)
Patient needs refill on her BP meds, she ran out two days ago and there are no more refills.  Pharmacy is [facility] on [address].  She needs refill asap, thank you.

## 2017-10-16 MED ORDER — AMLODIPINE BESYLATE 10 MG PO TABS: 10.0000 mg | ORAL_TABLET | Freq: Every day | ORAL | 0 refills | Status: DC

## 2017-10-16 MED ORDER — HYDROCHLOROTHIAZIDE 25 MG PO TABS: 25.0000 mg | ORAL_TABLET | Freq: Every day | ORAL | 0 refills | Status: DC

## 2017-11-02 ENCOUNTER — Ambulatory Visit: Payer: Medicaid Other

## 2017-11-25 ENCOUNTER — Ambulatory Visit: Payer: Medicaid Other | Admitting: Family Medicine

## 2017-12-05 ENCOUNTER — Encounter: Payer: Self-pay | Admitting: *Deleted

## 2018-01-24 IMAGING — US US MFM OB DETAIL+14 WK
1 series · 14 of 28 positions shown · non-contrast
Comparison: none

[Series 1: us mfm ob detail+14 wk · 14 of 73 slices shown]
[im 3/73]
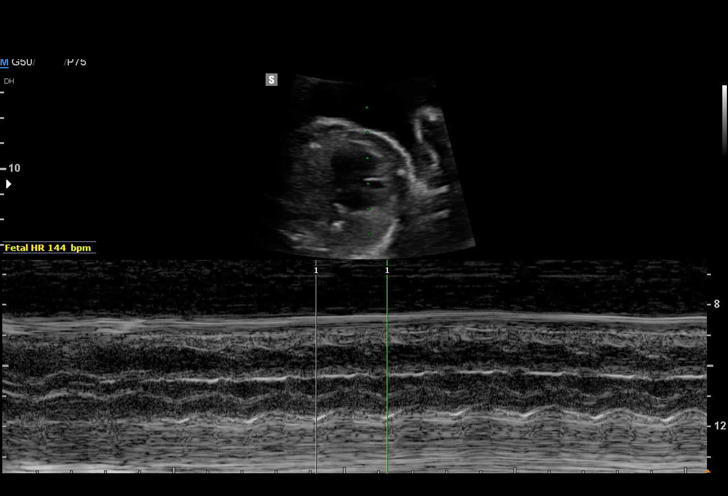
[im 9/73]
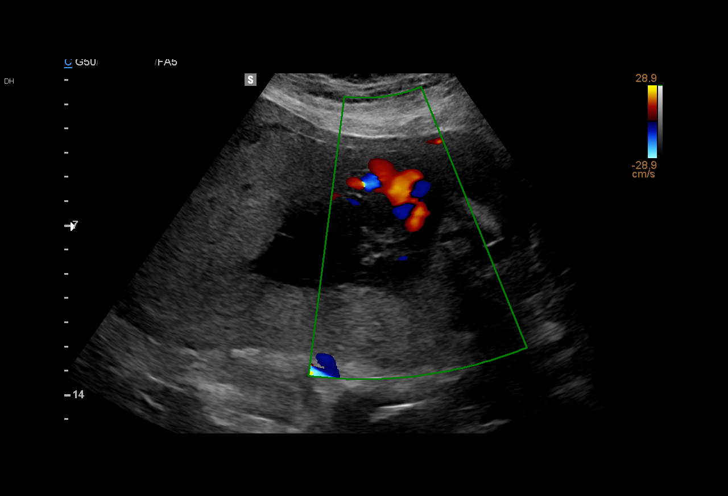
[im 14/73]
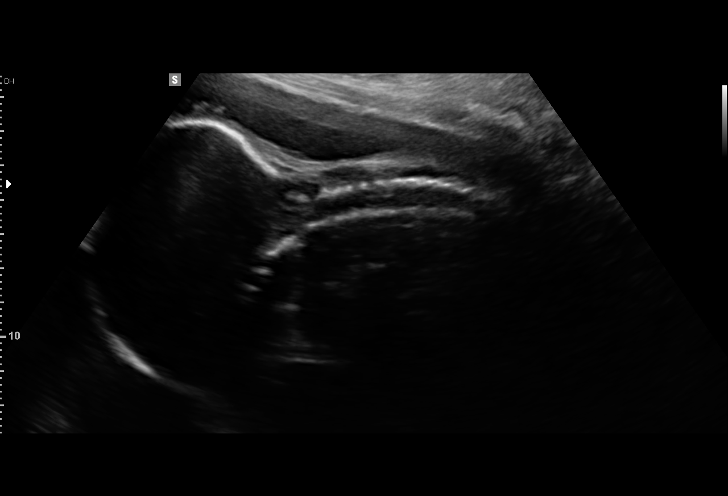
[im 19/73]
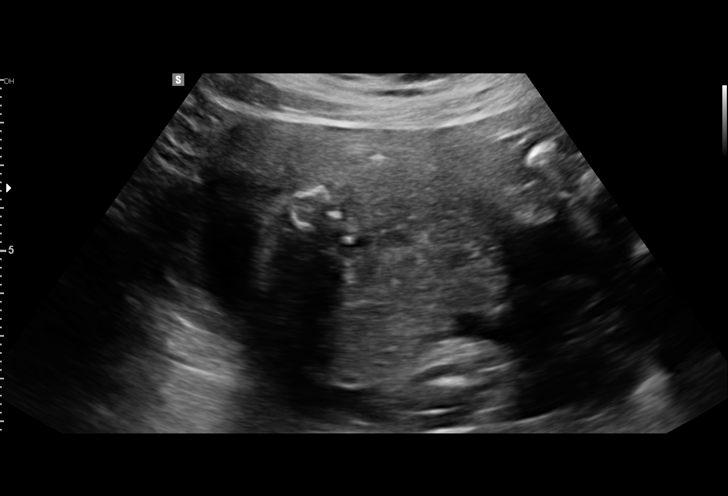
[im 25/73]
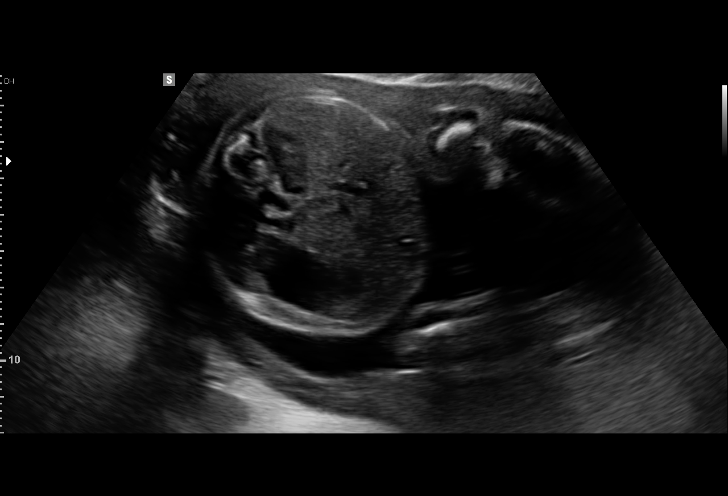
[im 30/73]
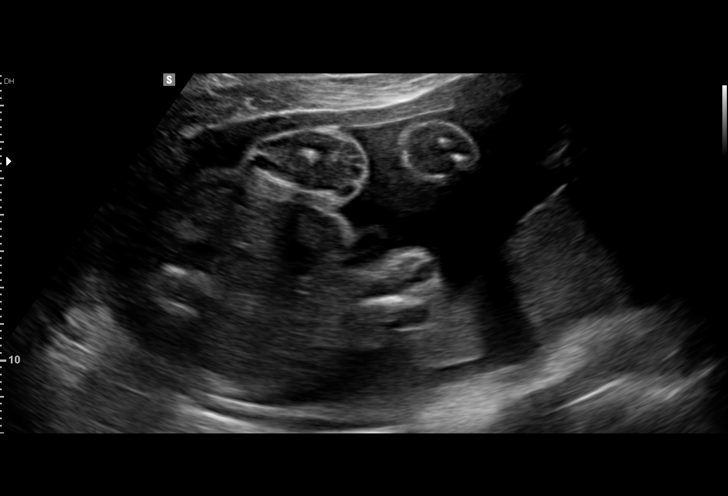
[im 35/73]
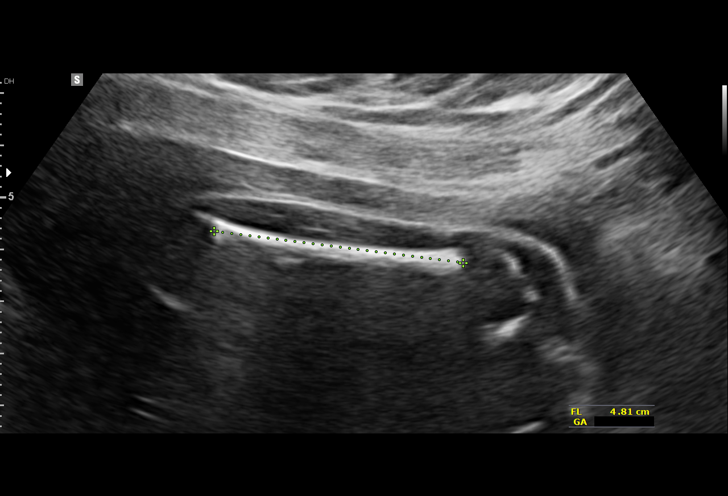
[im 41/73]
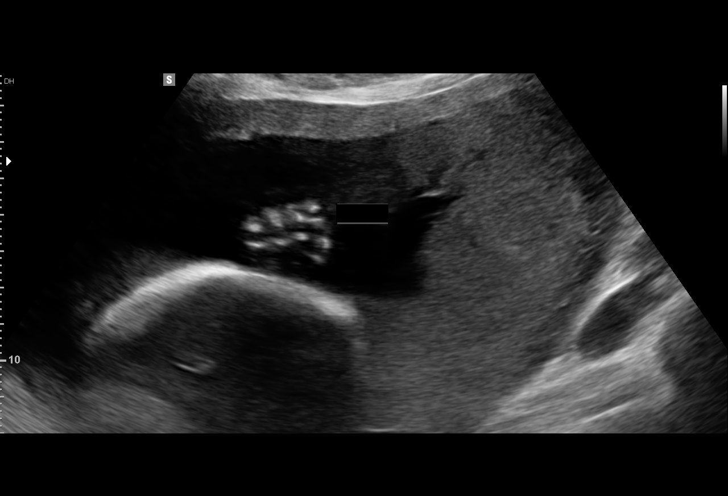
[im 46/73]
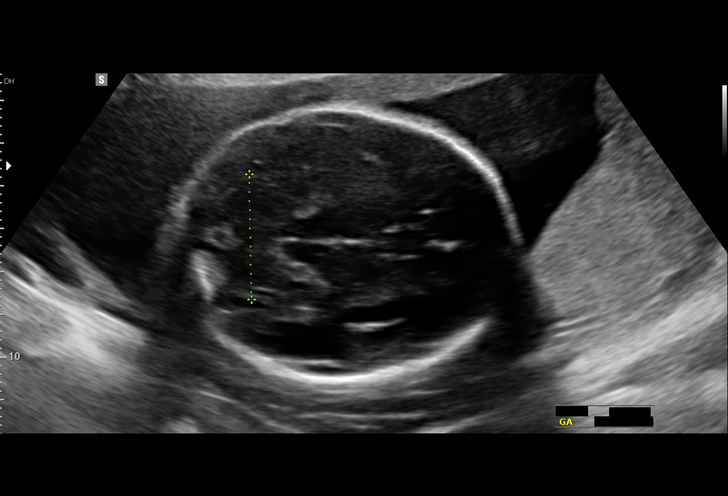
[im 51/73]
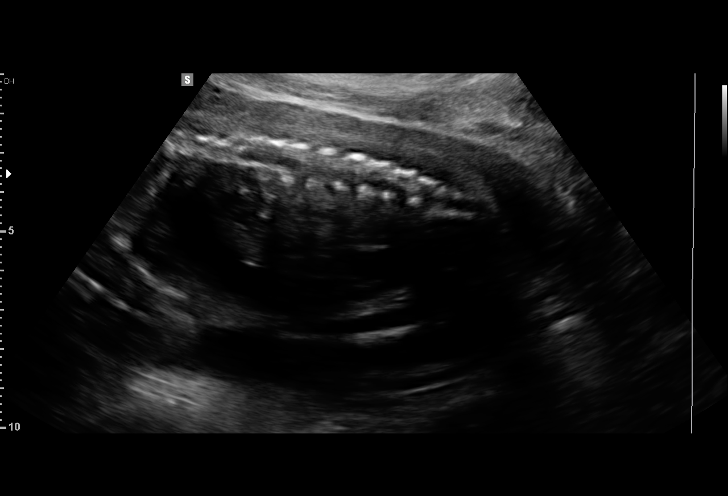
[im 57/73]
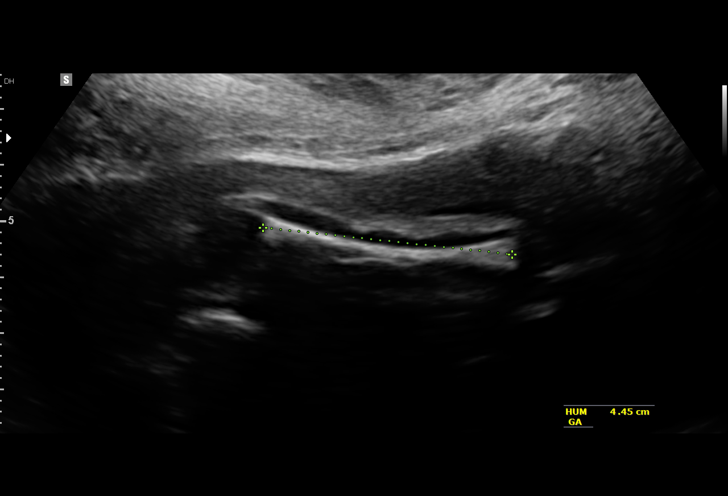
[im 62/73]
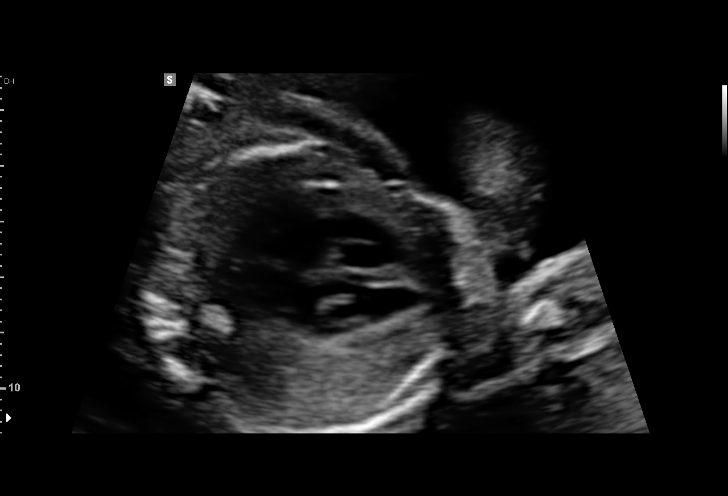
[im 67/73]
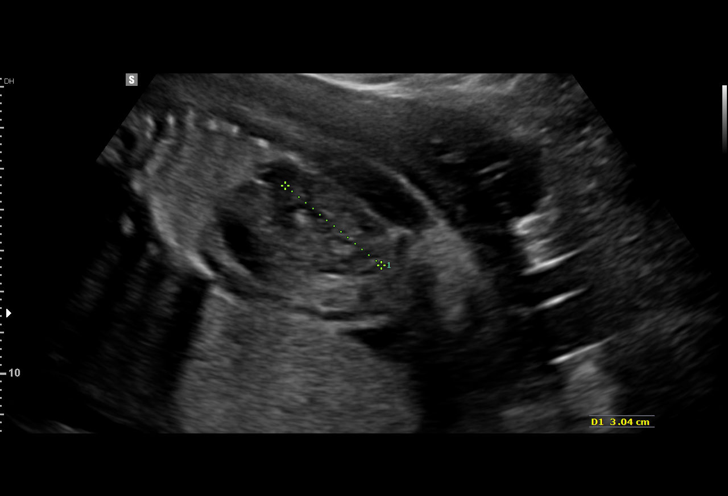
[im 73/73]
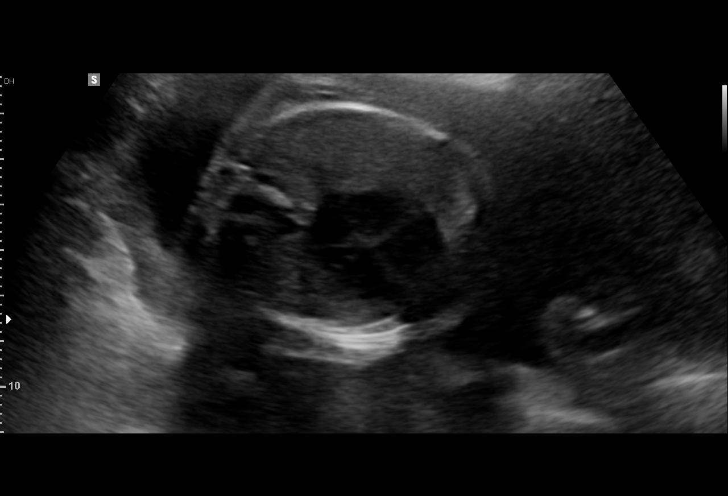

[14 of 28 positions shown; findings below may reference images not displayed]

1  ANELIA MUCHEV              [id number]      [id number]     [id number]
Indications

26 weeks gestation of pregnancy
Detailed fetal anatomic survey                 Z36
Uncertain LMP,  Establish Gestational Age      Z36
Advanced maternal age multigravida 35+,
second trimester
Hypertension - Chronic/Pre-existing
(labetalol)
Obesity complicating pregnancy, second
trimester
Poor obstetric history: Previous gestational
diabetes
OB History

Gravidity:    5         Term:   4        Prem:   0         SAB:   0
TOP:          0       Ectopic:  0        Living: 4
Fetal Evaluation

Num Of Fetuses:     1
Fetal Heart         144
Rate(bpm):
Cardiac Activity:   Observed
Presentation:       Breech
Placenta:           Posterior Fundal, above cervical os
P. Cord Insertion:  Marginal insertion at fundus

Amniotic Fluid
AFI FV:      Subjectively within normal limits

Largest Pocket(cm)
7.0
Biometry

BPD:        64  mm     G. Age:  25w 6d         36  %    CI:         73.49  %    70 - 86
FL/HC:       20.0  %    18.6 -
HC:      237.2  mm     G. Age:  25w 6d         20  %    HC/AC:       1.09       1.04 -
AC:      217.5  mm     G. Age:  26w 2d         48  %    FL/BPD:      74.2  %    71 - 87
FL:       47.5  mm     G. Age:  25w 6d         33  %    FL/AC:       21.8  %    20 - 24
HUM:      44.1  mm     G. Age:  26w 1d         51  %
CER:      29.2  mm     G. Age:  26w 1d         52  %
CM:        5.3  mm

Est. FW:     884   gm    1 lb 15 oz     55  %
Gestational Age

U/S Today:     26w 0d                                        EDD:    11/10/15
Best:          26w 0d     Det. By:  U/S (08/04/15)           EDD:    11/10/15
Anatomy

Cranium:               Appears normal         Aortic Arch:            Not well visualized
Cavum:                 Appears normal         Ductal Arch:            Not well visualized
Ventricles:            Appears normal         Diaphragm:              Appears normal
Choroid Plexus:        Appears normal         Stomach:                Appears normal, left
sided
Cerebellum:            Appears normal         Abdomen:                Appears normal
Posterior Fossa:       Appears normal         Abdominal Wall:         Appears nml (cord
insert, abd wall)
Nuchal Fold:           Not applicable (>20    Cord Vessels:           Appears normal (3
wks GA)                                        vessel cord)
Face:                  Appears normal         Kidneys:                Appear normal
(orbits and profile)
Lips:                  Appears normal         Bladder:                Appears normal
Thoracic:              Appears normal         Spine:                  Appears normal
Heart:                 Appears normal         Upper Extremities:      Appears normal
(4CH, axis, and situs
RVOT:                  Not well visualized    Lower Extremities:      Appears normal
LVOT:                  Not well visualized

Other:  Fetus appears to be a female. Heels and 5th digit visualized. Nasal
bone visualized. Technically difficult due to maternal habitus and fetal
position.
Cervix Uterus Adnexa

Cervix
Length:            3.7  cm.
Normal appearance by transabdominal scan.

Adnexa:       No abnormality visualized.
Impression

SIUP at 26+0 weeks
Normal detailed fetal anatomy; limited views of heart
Normal amniotic fluid volume
EDC based on today's measurements: 11/10/15
Recommendations

Follow-up ultrasound in 4-6 weeks to complete anatomy
survey
Offer genetic counseling for AMA

## 2018-02-13 ENCOUNTER — Encounter: Payer: Medicaid Other | Admitting: Family Medicine

## 2018-03-17 ENCOUNTER — Ambulatory Visit: Payer: Medicaid Other | Admitting: Family Medicine

## 2018-06-22 ENCOUNTER — Encounter: Payer: Self-pay | Admitting: *Deleted

## 2018-08-09 ENCOUNTER — Ambulatory Visit: Payer: Medicaid Other | Admitting: Family Medicine

## 2018-08-09 ENCOUNTER — Encounter: Payer: Self-pay | Admitting: Family Medicine

## 2018-08-09 ENCOUNTER — Other Ambulatory Visit (HOSPITAL_COMMUNITY)
Admission: RE | Admit: 2018-08-09 | Discharge: 2018-08-09 | Disposition: A | Discharge status: Home / Self Care | Payer: Medicaid Other | Source: Ambulatory Visit | Attending: Family Medicine | Admitting: Family Medicine

## 2018-08-09 ENCOUNTER — Other Ambulatory Visit: Payer: Self-pay

## 2018-08-09 VITALS — BP 123/80 | HR 65 | Wt 213.2 lb

## 2018-08-09 DIAGNOSIS — I10 Essential (primary) hypertension: Secondary | ICD-10-CM

## 2018-08-09 DIAGNOSIS — R748 Abnormal levels of other serum enzymes: Secondary | ICD-10-CM | POA: Diagnosis not present

## 2018-08-09 DIAGNOSIS — Z113 Encounter for screening for infections with a predominantly sexual mode of transmission: Secondary | ICD-10-CM | POA: Diagnosis not present

## 2018-08-09 DIAGNOSIS — Z01419 Encounter for gynecological examination (general) (routine) without abnormal findings: Secondary | ICD-10-CM | POA: Diagnosis not present

## 2018-08-09 DIAGNOSIS — Z124 Encounter for screening for malignant neoplasm of cervix: Secondary | ICD-10-CM | POA: Insufficient documentation

## 2018-08-09 MED ORDER — HYDROCHLOROTHIAZIDE 25 MG PO TABS: 25.0000 mg | ORAL_TABLET | Freq: Every day | ORAL | 3 refills | Status: DC

## 2018-08-09 NOTE — Patient Instructions (Signed)
It was great to see you today,  I will call you with the results of the Pap smear and STI testing, as well as the other lab test we ordered.  If you are going to be sexually active I would encourage always wearing a condom and if you have no desire to get pregnant a second form of birth control.  If you would like to discuss and a second form of birth control you can always make a follow-up visit.  Have a great day,  Clemetine Marker, MD

## 2018-08-09 NOTE — Assessment & Plan Note (Signed)
Patient not sexually active in previous 2 years.  Asymptomatic.  Has new partner.  Would like to be tested before engaging in sexual intercourse. - Labs: RPR, HIV, GC/CT, HPV - Educated patient on birth control methods.  Encouraged a second form of birth control if patient is to use condoms.

## 2018-08-09 NOTE — Progress Notes (Signed)
   [facility] Phone: [phone]   cc: STD check  Subjective:   STI check: Patient is a new female partner whom she has not had sexual intercourse with yet.  Both she and him are getting checked for sexually transmitted infections prior to engaging in sexual intercourse.  Patient has not had intercourse in 2 years prior to this.  Patient denies vaginal itching, vaginal discharge, vaginal odor, dysuria, abdominal pain, fever.  Patient says she is not using birth control and does not want to start birth control, but will be using condoms.  She does not desire to get pregnant.  HTN: Patient takes hydrochlorothiazide.  She used to take amlodipine as well, but her prescription ran out and she never got it refilled.  She has not had any issues with this medication.  She would like a refill today  ROS: See HPI for pertinent positives and negatives  Past Medical History  Family history reviewed for today's visit. No changes.  Social history- patient is a non smoker  Objective: BP 123/80   Pulse 65   Wt 213 lb 3.2 oz (96.7 kg)   LMP 08/09/2018   SpO2 99%   BMI 38.99 kg/m  Gen: NAD, alert and oriented, cooperative with exam CV: normal rate, regular rhythm. No murmurs, no rubs.  Resp: LCTAB, no wheezes, crackles. normal work of breathing GU: Normal labia.  No lesions seen in the vaginal walls or cervix.  Some bleeding noted, although patient said to be on her last day of her menstruation.  Assessment/Plan: Routine screening for STI (sexually transmitted infection) Patient not sexually active in previous 2 years.  Asymptomatic.  Has new partner.  Would like to be tested before engaging in sexual intercourse. - Labs: RPR, HIV, GC/CT, HPV - Educated patient on birth control methods.  Encouraged a second form of birth control if patient is to use condoms.  Essential hypertension, benign Patient taking HCTZ but not amlodipine.  Blood pressure well controlled today,  123/80. -Refill hydrochlorothiazide - BMP  Cervical cancer screening Pap smear performed with cotesting.  Nothing abnormal on exam. - Follow-up Pap smear results.    Clemetine Marker, MD PGY-2

## 2018-08-09 NOTE — Assessment & Plan Note (Signed)
Patient taking HCTZ but not amlodipine.  Blood pressure well controlled today, 123/80. -Refill hydrochlorothiazide - BMP

## 2018-08-09 NOTE — Addendum Note (Signed)
Addended by: Owens Shark, CARINA on: 08/09/2018 06:16 PM   Modules accepted: Level of Service

## 2018-08-09 NOTE — Assessment & Plan Note (Signed)
Pap smear performed with cotesting.  Nothing abnormal on exam. - Follow-up Pap smear results.

## 2018-08-10 LAB — BASIC METABOLIC PANEL
BUN/Creatinine Ratio: 13 (ref 9–23)
BUN: 12 mg/dL (ref 6–20)
CO2: 24 mmol/L (ref 20–29)
Calcium: 9.5 mg/dL (ref 8.7–10.2)
Chloride: 102 mmol/L (ref 96–106)
Creatinine, Ser: 0.96 mg/dL (ref 0.57–1.00)
GFR calc Af Amer: 86 mL/min/{1.73_m2} (ref >59)
GFR calc non Af Amer: 75 mL/min/{1.73_m2} (ref >59)
Glucose: 101 mg/dL — ABNORMAL HIGH (ref 65–99)
Potassium: 4.1 mmol/L (ref 3.5–5.2)
Sodium: 140 mmol/L (ref 134–144)

## 2018-08-10 LAB — CYTOLOGY - PAP
Diagnosis: NEGATIVE
HPV: NOT DETECTED

## 2018-08-10 LAB — HEPATIC FUNCTION PANEL
ALT: 10 IU/L (ref 0–32)
AST: 18 IU/L (ref 0–40)
Albumin: 4.1 g/dL (ref 3.8–4.8)
Alkaline Phosphatase: 86 IU/L (ref 39–117)
Bilirubin Total: 0.3 mg/dL (ref 0.0–1.2)
Bilirubin, Direct: 0.07 mg/dL (ref 0.00–0.40)
Total Protein: 7.5 g/dL (ref 6.0–8.5)

## 2018-08-10 LAB — CERVICOVAGINAL ANCILLARY ONLY
Chlamydia: NEGATIVE
Neisseria Gonorrhea: NEGATIVE

## 2018-08-10 LAB — HIV ANTIBODY (ROUTINE TESTING W REFLEX): HIV Screen 4th Generation wRfx: NONREACTIVE

## 2018-08-10 LAB — RPR: RPR Ser Ql: NONREACTIVE

## 2018-08-14 ENCOUNTER — Other Ambulatory Visit: Payer: Self-pay

## 2018-08-14 ENCOUNTER — Encounter (HOSPITAL_COMMUNITY): Payer: Self-pay | Admitting: Emergency Medicine

## 2018-08-14 ENCOUNTER — Encounter (HOSPITAL_COMMUNITY): Payer: Self-pay

## 2018-08-14 ENCOUNTER — Inpatient Hospital Stay (HOSPITAL_COMMUNITY)
Admission: AD | Admit: 2018-08-14 | Discharge: 2018-08-21 | DRG: 885 | Disposition: A | Discharge status: Home / Self Care | Payer: Medicaid Other | Source: Intra-hospital | Attending: Psychiatry | Admitting: Psychiatry

## 2018-08-14 ENCOUNTER — Emergency Department (HOSPITAL_COMMUNITY)
Admission: EM | Admit: 2018-08-14 | Discharge: 2018-08-14 | Disposition: A | Discharge status: Other Acute Inpatient Hospital | Payer: Medicaid Other | Attending: Emergency Medicine | Admitting: Emergency Medicine

## 2018-08-14 DIAGNOSIS — I10 Essential (primary) hypertension: Secondary | ICD-10-CM | POA: Diagnosis not present

## 2018-08-14 DIAGNOSIS — F333 Major depressive disorder, recurrent, severe with psychotic symptoms: Secondary | ICD-10-CM | POA: Diagnosis not present

## 2018-08-14 DIAGNOSIS — Z1159 Encounter for screening for other viral diseases: Secondary | ICD-10-CM

## 2018-08-14 DIAGNOSIS — R45851 Suicidal ideations: Secondary | ICD-10-CM | POA: Insufficient documentation

## 2018-08-14 DIAGNOSIS — Z79899 Other long term (current) drug therapy: Secondary | ICD-10-CM | POA: Diagnosis not present

## 2018-08-14 DIAGNOSIS — F32A Depression, unspecified: Secondary | ICD-10-CM

## 2018-08-14 DIAGNOSIS — Z20828 Contact with and (suspected) exposure to other viral communicable diseases: Secondary | ICD-10-CM | POA: Insufficient documentation

## 2018-08-14 DIAGNOSIS — F329 Major depressive disorder, single episode, unspecified: Secondary | ICD-10-CM

## 2018-08-14 DIAGNOSIS — Z008 Encounter for other general examination: Secondary | ICD-10-CM | POA: Insufficient documentation

## 2018-08-14 LAB — CBC
HCT: 37.8 % (ref 36.0–46.0)
Hemoglobin: 11.3 g/dL — ABNORMAL LOW (ref 12.0–15.0)
MCH: 24.3 pg — ABNORMAL LOW (ref 26.0–34.0)
MCHC: 29.9 g/dL — ABNORMAL LOW (ref 30.0–36.0)
MCV: 81.3 fL (ref 80.0–100.0)
Platelets: 242 10*3/uL (ref 150–400)
RBC: 4.65 MIL/uL (ref 3.87–5.11)
RDW: 14.7 % (ref 11.5–15.5)
WBC: 7.8 10*3/uL (ref 4.0–10.5)
nRBC: 0 % (ref 0.0–0.2)

## 2018-08-14 LAB — COMPREHENSIVE METABOLIC PANEL
ALT: 14 U/L (ref 0–44)
AST: 15 U/L (ref 15–41)
Albumin: 3.8 g/dL (ref 3.5–5.0)
Alkaline Phosphatase: 71 U/L (ref 38–126)
Anion gap: 11 (ref 5–15)
BUN: 11 mg/dL (ref 6–20)
CO2: 24 mmol/L (ref 22–32)
Calcium: 9.3 mg/dL (ref 8.9–10.3)
Chloride: 103 mmol/L (ref 98–111)
Creatinine, Ser: 0.88 mg/dL (ref 0.44–1.00)
GFR calc Af Amer: >60 mL/min (ref >60)
GFR calc non Af Amer: >60 mL/min (ref >60)
Glucose, Bld: 121 mg/dL — ABNORMAL HIGH (ref 70–99)
Potassium: 3.3 mmol/L — ABNORMAL LOW (ref 3.5–5.1)
Sodium: 138 mmol/L (ref 135–145)
Total Bilirubin: 1 mg/dL (ref 0.3–1.2)
Total Protein: 8.2 g/dL — ABNORMAL HIGH (ref 6.5–8.1)

## 2018-08-14 LAB — ETHANOL: Alcohol, Ethyl (B): <10 mg/dL (ref <10)

## 2018-08-14 LAB — I-STAT BETA HCG BLOOD, ED (MC, WL, AP ONLY): I-stat hCG, quantitative: <5 m[IU]/mL (ref <5)

## 2018-08-14 LAB — RAPID URINE DRUG SCREEN, HOSP PERFORMED
Amphetamines: NOT DETECTED
Barbiturates: NOT DETECTED
Benzodiazepines: NOT DETECTED
Cocaine: NOT DETECTED
Opiates: NOT DETECTED
Tetrahydrocannabinol: NOT DETECTED

## 2018-08-14 LAB — SALICYLATE LEVEL: Salicylate Lvl: <7 mg/dL (ref 2.8–30.0)

## 2018-08-14 LAB — ACETAMINOPHEN LEVEL: Acetaminophen (Tylenol), Serum: <10 ug/mL — ABNORMAL LOW (ref 10–30)

## 2018-08-14 LAB — SARS CORONAVIRUS 2 BY RT PCR (HOSPITAL ORDER, PERFORMED IN ~~LOC~~ HOSPITAL LAB): SARS Coronavirus 2: NEGATIVE

## 2018-08-14 MED ORDER — MAGNESIUM HYDROXIDE 400 MG/5ML PO SUSP: 30.0000 mL | Freq: Every day | ORAL | Status: DC | PRN

## 2018-08-14 MED ORDER — ACETAMINOPHEN 325 MG PO TABS
650.0000 mg | ORAL_TABLET | Freq: Four times a day (QID) | ORAL | Status: DC | PRN
  Administered 2018-08-14 – 2018-08-20 (×3): 650 mg via ORAL
  Filled 2018-08-14 (×3): qty 2

## 2018-08-14 MED ORDER — AMLODIPINE BESYLATE 10 MG PO TABS
10.0000 mg | ORAL_TABLET | Freq: Every day | ORAL | Status: DC
  Administered 2018-08-14 – 2018-08-21 (×8): 10 mg via ORAL
  Filled 2018-08-14 (×10): qty 1

## 2018-08-14 MED ORDER — HYDROCHLOROTHIAZIDE 25 MG PO TABS
25.0000 mg | ORAL_TABLET | Freq: Every day | ORAL | Status: DC
  Administered 2018-08-14: 25 mg via ORAL
  Filled 2018-08-14: qty 1

## 2018-08-14 MED ORDER — TEMAZEPAM 30 MG PO CAPS
30.0000 mg | ORAL_CAPSULE | Freq: Every day | ORAL | Status: DC
  Administered 2018-08-14 – 2018-08-19 (×6): 30 mg via ORAL
  Filled 2018-08-14 (×2): qty 1
  Filled 2018-08-14: qty 3
  Filled 2018-08-14 (×3): qty 1

## 2018-08-14 MED ORDER — PRENATAL MULTIVITAMIN CH
1.0000 | ORAL_TABLET | Freq: Every day | ORAL | Status: DC
  Administered 2018-08-14 – 2018-08-21 (×8): 1 via ORAL
  Filled 2018-08-14 (×9): qty 1

## 2018-08-14 MED ORDER — HYDROXYZINE HCL 50 MG PO TABS
50.0000 mg | ORAL_TABLET | ORAL | Status: DC | PRN
  Administered 2018-08-14 – 2018-08-20 (×10): 50 mg via ORAL
  Filled 2018-08-14 (×3): qty 1
  Filled 2018-08-14: qty 3
  Filled 2018-08-14 (×6): qty 1

## 2018-08-14 MED ORDER — FLUOXETINE HCL 20 MG PO CAPS
20.0000 mg | ORAL_CAPSULE | Freq: Every day | ORAL | Status: DC
  Administered 2018-08-14 – 2018-08-16 (×3): 20 mg via ORAL
  Filled 2018-08-14 (×4): qty 1

## 2018-08-14 MED ORDER — PROPRANOLOL HCL 40 MG PO TABS
40.0000 mg | ORAL_TABLET | Freq: Two times a day (BID) | ORAL | Status: DC
  Administered 2018-08-14: 40 mg via ORAL
  Filled 2018-08-14 (×4): qty 1

## 2018-08-14 MED ORDER — ALUM & MAG HYDROXIDE-SIMETH 200-200-20 MG/5ML PO SUSP: 30.0000 mL | ORAL | Status: DC | PRN

## 2018-08-14 MED ORDER — CLONIDINE HCL 0.1 MG PO TABS
0.2000 mg | ORAL_TABLET | ORAL | Status: DC | PRN
  Administered 2018-08-15: 07:00:00 0.2 mg via ORAL
  Filled 2018-08-14: qty 2

## 2018-08-14 MED ORDER — CLONAZEPAM 0.5 MG PO TABS
0.5000 mg | ORAL_TABLET | Freq: Two times a day (BID) | ORAL | Status: DC
  Administered 2018-08-14 – 2018-08-21 (×14): 0.5 mg via ORAL
  Filled 2018-08-14 (×14): qty 1

## 2018-08-14 NOTE — ED Notes (Signed)
Pts transferred with [facility] to [facility]. Belongings given to [employer].

## 2018-08-14 NOTE — Tx Team (Signed)
Initial Treatment Plan 08/14/2018 11:55 AM Kendra Harris ACZ:[id number]    PATIENT STRESSORS: Financial difficulties Health problems Medication change or noncompliance   PATIENT STRENGTHS: Average or above average intelligence Capable of independent living Communication skills Supportive family/friends   PATIENT IDENTIFIED PROBLEMS: "To get my life back to normal"  Depression  Hallucinations  Suicidal thoughts               DISCHARGE CRITERIA:  Ability to meet basic life and health needs Motivation to continue treatment in a less acute level of care  PRELIMINARY DISCHARGE PLAN: Outpatient therapy Placement in alternative living arrangements  PATIENT/FAMILY INVOLVEMENT: This treatment plan has been presented to and reviewed with the patient, Kendra Harris, and/or family member.  The patient and family have been given the opportunity to ask questions and make suggestions.  Vela Prose, RN 08/14/2018, 11:55 AM

## 2018-08-14 NOTE — ED Notes (Signed)
Per Marijean Bravo, from [facility] pt has been accepted however room has not been assigned at this time.

## 2018-08-14 NOTE — ED Notes (Signed)
Report given to [facility] RN 

## 2018-08-14 NOTE — Progress Notes (Signed)
Patient ID: Kendra Harris, female   DOB: Aug 01, 1980, 38 y.o.   MRN: [id number]   [place] NOVEL CORONAVIRUS (COVID-19) DAILY CHECK-OFF SYMPTOMS - answer yes or no to each - every day NO YES  Have you had a fever in the past 24 hours?  . Fever (Temp > 37.80C / 100F) X   Have you had any of these symptoms in the past 24 hours? . New Cough .  Sore Throat  .  Shortness of Breath .  Difficulty Breathing .  Unexplained Body Aches   X   Have you had any one of these symptoms in the past 24 hours not related to allergies?   . Runny Nose .  Nasal Congestion .  Sneezing   X   If you have had runny nose, nasal congestion, sneezing in the past 24 hours, has it worsened?  X   EXPOSURES - check yes or no X   Have you traveled outside the state in the past 14 days?  X   Have you been in contact with someone with a confirmed diagnosis of COVID-19 or PUI in the past 14 days without wearing appropriate PPE?  X   Have you been living in the same home as a person with confirmed diagnosis of COVID-19 or a PUI (household contact)?    X   Have you been diagnosed with COVID-19?    X              What to do next: Answered NO to all: Answered YES to anything:   Proceed with unit schedule Follow the [facility] Inpatient Flowsheet.

## 2018-08-14 NOTE — ED Notes (Signed)
Ordered bfast 

## 2018-08-14 NOTE — ED Notes (Signed)
Patients belongings inventoried and one bag placed in locker #7. Patients valuables given to security.

## 2018-08-14 NOTE — Progress Notes (Signed)
D   Pt is pleasant on approach and she is cooperative    She endorses some depression and anxiety    She also complained of poor sleep and requested sleep medications A   Verbal support given   Medications administered and effectiveness monitored   Q 15 min checks R   Pt is safe at this time   [place] CORONAVIRUS (COVID-19) DAILY CHECK-OFF SYMPTOMS - answer yes or no to each - every day NO YES  Have you had a fever in the past 24 hours?  . Fever (Temp > 37.80C / 100F) X   Have you had any of these symptoms in the past 24 hours? . New Cough .  Sore Throat  .  Shortness of Breath .  Difficulty Breathing .  Unexplained Body Aches   X   Have you had any one of these symptoms in the past 24 hours not related to allergies?   . Runny Nose .  Nasal Congestion .  Sneezing   X   If you have had runny nose, nasal congestion, sneezing in the past 24 hours, has it worsened?  X   EXPOSURES - check yes or no X   Have you traveled outside the state in the past 14 days?  X   Have you been in contact with someone with a confirmed diagnosis of COVID-19 or PUI in the past 14 days without wearing appropriate PPE?  X   Have you been living in the same home as a person with confirmed diagnosis of COVID-19 or a PUI (household contact)?    X   Have you been diagnosed with COVID-19?    X              What to do next: Answered NO to all: Answered YES to anything:   Proceed with unit schedule Follow the [facility] Inpatient Flowsheet.

## 2018-08-14 NOTE — Progress Notes (Signed)
Admission Note: Patient is a 38 year old female who is admitted to the unit for symptoms of depression, hallucinations and suicidal ideation.  Patient currently denies suicidal ideation while in the hospital.  Reports poor appetite and sleep for the past few days.  States she has not taken her depression medication for two years.  Reports hearing voices telling her to kill herself.  States she is here to get her life back to normal.  Patient is alert and oriented x 4.  Admission plan of care reviewed and consent signed.  Skin assessment and personal belongings completed.  Skin is dry and intact.  No contraband found.  Patient oriented to the unit, staff and room.  Routine safety checks initiated.  Verbalizes understanding unit rules and protocols.  Patient is safe on the unit.

## 2018-08-14 NOTE — ED Provider Notes (Signed)
[facility] EMERGENCY DEPARTMENT Provider Note  CSN: [id number] Arrival date & time: 08/14/18 3662  Chief Complaint(s) Suicidal  HPI Kendra Harris is a 38 y.o. female   The history is provided by the patient.  Mental Health Problem Presenting symptoms: depression and suicidal thoughts (no plan)   Presenting symptoms: no hallucinations and no homicidal ideas   Degree of incapacity (severity):  Moderate Onset quality:  Gradual Duration:  2 weeks Timing:  Constant Progression:  Worsening Context: not alcohol use and not drug abuse   Treatment compliance:  Untreated Time since last psychoactive medication taken:  2 years Relieved by:  Nothing Associated symptoms: insomnia   Risk factors: hx of mental illness   Risk factors: no hx of suicide attempts    Reports that his neighbor died in his garage several weeks ago and she keeps seeing "death" while trying to sleep. Reports that she has not been able to sleep.   Past Medical History Past Medical History:  Diagnosis Date  . DELAYED MENSES 02/11/2010   Qualifier: Diagnosis of  By: Sherilyn Cooter  MD, Hosie Poisson    . Gestational diabetes   . Hypertension   . Urinary tract infection 03/04/2011   Patient Active Problem List   Diagnosis Date Noted  . Routine screening for STI (sexually transmitted infection) 08/09/2018  . Cervical cancer screening 08/09/2018  . Shoulder pain, right 01/24/2014  . Menorrhagia 04/27/2013  . Severe major depression with psychotic features ([place]) 07/24/2010  . MIGRAINE HEADACHE 12/13/2007  . Essential hypertension, benign 08/31/2007  . Morbid obesity with BMI of 45.0-49.9, adult ([place]) 04/07/2006   Home Medication(s) Prior to Admission medications   Medication Sig Start Date End Date Taking? Authorizing Provider  acetaminophen (TYLENOL) 325 MG tablet Take 2 tablets (650 mg total) by mouth every 4 (four) hours as needed (for pain scale < 4). 11/05/15   Waldemar Dickens, MD  amLODipine  (NORVASC) 10 MG tablet Take 1 tablet (10 mg total) by mouth daily. Needs to make a BP follow up for further prescriptions 10/16/17 11/15/17  Patriciaann Clan, DO  hydrochlorothiazide (HYDRODIURIL) 25 MG tablet Take 1 tablet (25 mg total) by mouth daily. Needs to make a BP follow up for further prescriptions. 08/09/18 08/04/19  Benay Pike, MD                                                                                                                                    Past Surgical History Past Surgical History:  Procedure Laterality Date  . NO PAST SURGERIES     Family History Family History  Problem Relation Age of Onset  . Sickle cell trait Sister     Social History Social History   Tobacco Use  . Smoking status: Never Smoker  . Smokeless tobacco: Never Used  Substance Use Topics  . Alcohol use: No  . Drug use: No   Allergies Patient  has no known allergies.  Review of Systems Review of Systems  Psychiatric/Behavioral: Positive for suicidal ideas (no plan). Negative for hallucinations and homicidal ideas. The patient has insomnia.    All other systems are reviewed and are negative for acute change except as noted in the HPI  Physical Exam Vital Signs  I have reviewed the triage vital signs BP (!) 180/115 (BP Location: Right Arm)   Pulse 73   Temp 98.1 F (36.7 C) (Oral)   Resp 18   Ht 5\' 2"  (1.575 m)   Wt 94 kg   LMP 08/09/2018   SpO2 100%   BMI 37.90 kg/m   Physical Exam Vitals signs reviewed.  Constitutional:      General: She is not in acute distress.    Appearance: She is well-developed. She is not diaphoretic.  HENT:     Head: Normocephalic and atraumatic.     Right Ear: External ear normal.     Left Ear: External ear normal.     Nose: Nose normal.  Eyes:     General: No scleral icterus.    Conjunctiva/sclera: Conjunctivae normal.  Neck:     Musculoskeletal: Normal range of motion.     Trachea: Phonation normal.  Cardiovascular:     Rate and  Rhythm: Normal rate and regular rhythm.  Pulmonary:     Effort: Pulmonary effort is normal. No respiratory distress.     Breath sounds: No stridor.  Abdominal:     General: There is no distension.  Musculoskeletal: Normal range of motion.  Neurological:     Mental Status: She is alert and oriented to person, place, and time.  Psychiatric:        Attention and Perception: Attention normal.        Mood and Affect: Mood is depressed. Affect is labile.        Behavior: Behavior normal.        Thought Content: Thought content is not paranoid or delusional. Thought content includes suicidal ideation. Thought content does not include homicidal ideation. Thought content does not include homicidal or suicidal plan.     ED Results and Treatments Labs (all labs ordered are listed, but only abnormal results are displayed) Labs Reviewed  COMPREHENSIVE METABOLIC PANEL  ETHANOL  SALICYLATE LEVEL  ACETAMINOPHEN LEVEL  CBC  RAPID URINE DRUG SCREEN, HOSP PERFORMED  I-STAT BETA HCG BLOOD, ED ([facility], [facility], [facility] ONLY)                                                                                                                         EKG  EKG Interpretation  Date/Time:    Ventricular Rate:    PR Interval:    QRS Duration:   QT Interval:    QTC Calculation:   R Axis:     Text Interpretation:        Radiology No results found.  Pertinent labs & imaging results that were available during my care of the patient were  reviewed by me and considered in my medical decision making (see chart for details).  Medications Ordered in ED Medications - No data to display                                                                                                                                  Procedures Procedures  (including critical care time)  Medical Decision Making / ED Course I have reviewed the nursing notes for this encounter and the patient's prior records (if available in EHR or on  provided paperwork).   Kendra Harris was evaluated in Emergency Department on 08/14/2018 for the symptoms described in the history of present illness. She was evaluated in the context of the global COVID-19 pandemic, which necessitated consideration that the patient might be at risk for infection with the SARS-CoV-2 virus that causes COVID-19. Institutional protocols and algorithms that pertain to the evaluation of patients at risk for COVID-19 are in a state of rapid change based on information released by regulatory bodies including the CDC and federal and state organizations. These policies and algorithms were followed during the patient's care in the ED.  SI w/o active plan, but appears severely depressed. Will have BH evaluate.  Behavioral health recommended inpatient admission.     Final Clinical Impression(s) / ED Diagnoses Final diagnoses:  Suicidal ideation  Depression, unspecified depression type      This chart was dictated using voice recognition software.  Despite best efforts to proofread,  errors can occur which can change the documentation meaning.   Nira Connardama, Cloyde Oregel Eduardo, MD 08/14/18 [phone]

## 2018-08-14 NOTE — Progress Notes (Signed)
Pt accepted to [facility], Bed 501-2  Lurline Del, NP is the accepting provider.  Johnn Hai, MD is the attending provider.  Call report to [phone]   @ [facility] ED notified.   Pt is Voluntary.  Pt may be transported by [facility]  Pt scheduled  to arrive at [facility] as soon as transport can be arranged.  Areatha Keas. Judi Cong, MSW, [place] Disposition Clinical Social Work [phone] (cell) [phone] (office)

## 2018-08-14 NOTE — ED Notes (Signed)
Pt. Belongings inventoried and placed in locker #7

## 2018-08-14 NOTE — ED Triage Notes (Signed)
Pt presents tearful, stating she has hx of schizophrenia and depression, pt has had no meds x 2 years SI x 2days, no plan.

## 2018-08-14 NOTE — Tx Team (Signed)
Interdisciplinary Treatment and Diagnostic Plan Update  08/14/2018 Time of Session: 11:02am Kendra Harris MRN: [id number]  Principal Diagnosis: <principal problem not specified>  Secondary Diagnoses: Active Problems:   Major depressive disorder, recurrent episode, severe, with psychotic behavior ([place])   Current Medications:  Current Facility-Administered Medications  Medication Dose Route Frequency Provider Last Rate Last Dose  . acetaminophen (TYLENOL) tablet 650 mg  650 mg Oral Q6H PRN Deloria Lair, NP   650 mg at 08/14/18 1216  . alum & mag hydroxide-simeth (MAALOX/MYLANTA) 200-200-20 MG/5ML suspension 30 mL  30 mL Oral Q4H PRN Dixon, Rashaun M, NP      . amLODipine (NORVASC) tablet 10 mg  10 mg Oral Daily Johnn Hai, MD   10 mg at 08/14/18 1216  . clonazePAM (KLONOPIN) tablet 0.5 mg  0.5 mg Oral BID Johnn Hai, MD      . cloNIDine (CATAPRES) tablet 0.2 mg  0.2 mg Oral Q4H PRN Johnn Hai, MD      . FLUoxetine (PROZAC) capsule 20 mg  20 mg Oral Daily Johnn Hai, MD   20 mg at 08/14/18 1216  . hydrOXYzine (ATARAX/VISTARIL) tablet 50 mg  50 mg Oral Q4H PRN Johnn Hai, MD      . magnesium hydroxide (MILK OF MAGNESIA) suspension 30 mL  30 mL Oral Daily PRN Deloria Lair, NP      . prenatal multivitamin tablet 1 tablet  1 tablet Oral Daily Johnn Hai, MD   1 tablet at 08/14/18 1216  . propranolol (INDERAL) tablet 40 mg  40 mg Oral BID Johnn Hai, MD      . temazepam (RESTORIL) capsule 30 mg  30 mg Oral QHS Johnn Hai, MD       PTA Medications: Medications Prior to Admission  Medication Sig Dispense Refill Last Dose  . acetaminophen (TYLENOL) 325 MG tablet Take 2 tablets (650 mg total) by mouth every 4 (four) hours as needed (for pain scale < 4). 30 tablet 0   . amLODipine (NORVASC) 10 MG tablet Take 1 tablet (10 mg total) by mouth daily. Needs to make a BP follow up for further prescriptions (Patient not taking: Reported on 08/14/2018) 30 tablet 0   .  hydrochlorothiazide (HYDRODIURIL) 25 MG tablet Take 1 tablet (25 mg total) by mouth daily. Needs to make a BP follow up for further prescriptions. 90 tablet 3     Patient Stressors: Financial difficulties Health problems Medication change or noncompliance  Patient Strengths: Average or above average intelligence Capable of independent living Communication skills Supportive family/friends  Treatment Modalities: Medication Management, Group therapy, Case management,  1 to 1 session with clinician, Psychoeducation, Recreational therapy.   Physician Treatment Plan for Primary Diagnosis: <principal problem not specified> Long Term Goal(s): Improvement in symptoms so as ready for discharge Improvement in symptoms so as ready for discharge   Short Term Goals: Ability to disclose and discuss suicidal ideas Ability to demonstrate self-control will improve Ability to identify and develop effective coping behaviors will improve Ability to identify and develop effective coping behaviors will improve Ability to maintain clinical measurements within normal limits will improve Compliance with prescribed medications will improve Ability to identify triggers associated with substance abuse/mental health issues will improve  Medication Management: Evaluate patient's response, side effects, and tolerance of medication regimen.  Therapeutic Interventions: 1 to 1 sessions, Unit Group sessions and Medication administration.  Evaluation of Outcomes: Not Met  Physician Treatment Plan for Secondary Diagnosis: Active Problems:   Major depressive disorder, recurrent  episode, severe, with psychotic behavior ([name])  Long Term Goal(s): Improvement in symptoms so as ready for discharge Improvement in symptoms so as ready for discharge   Short Term Goals: Ability to disclose and discuss suicidal ideas Ability to demonstrate self-control will improve Ability to identify and develop effective coping behaviors  will improve Ability to identify and develop effective coping behaviors will improve Ability to maintain clinical measurements within normal limits will improve Compliance with prescribed medications will improve Ability to identify triggers associated with substance abuse/mental health issues will improve     Medication Management: Evaluate patient's response, side effects, and tolerance of medication regimen.  Therapeutic Interventions: 1 to 1 sessions, Unit Group sessions and Medication administration.  Evaluation of Outcomes: Not Met   RN Treatment Plan for Primary Diagnosis: <principal problem not specified> Long Term Goal(s): Knowledge of disease and therapeutic regimen to maintain health will improve  Short Term Goals: Ability to participate in decision making will improve, Ability to verbalize feelings will improve, Ability to disclose and discuss suicidal ideas, Ability to identify and develop effective coping behaviors will improve and Compliance with prescribed medications will improve  Medication Management: RN will administer medications as ordered by provider, will assess and evaluate patient's response and provide education to patient for prescribed medication. RN will report any adverse and/or side effects to prescribing provider.  Therapeutic Interventions: 1 on 1 counseling sessions, Psychoeducation, Medication administration, Evaluate responses to treatment, Monitor vital signs and CBGs as ordered, Perform/monitor CIWA, COWS, AIMS and Fall Risk screenings as ordered, Perform wound care treatments as ordered.  Evaluation of Outcomes: Not Met   LCSW Treatment Plan for Primary Diagnosis: <principal problem not specified> Long Term Goal(s): Safe transition to appropriate next level of care at discharge, Engage patient in therapeutic group addressing interpersonal concerns.  Short Term Goals: Engage patient in aftercare planning with referrals and resources and Increase  skills for wellness and recovery  Therapeutic Interventions: Assess for all discharge needs, 1 to 1 time with Social worker, Explore available resources and support systems, Assess for adequacy in community support network, Educate family and significant other(s) on suicide prevention, Complete Psychosocial Assessment, Interpersonal group therapy.  Evaluation of Outcomes: Not Met   Progress in Treatment: Attending groups: No. Participating in groups: No. Taking medication as prescribed: No. Toleration medication: No. Family/Significant other contact made: No, will contact:  will contact if given consent to contact Patient understands diagnosis: No. Discussing patient identified problems/goals with staff: Yes. Medical problems stabilized or resolved: Yes. Denies suicidal/homicidal ideation: Yes. Issues/concerns per patient self-inventory: No. Other:   New problem(s) identified: No, Describe:  None  New Short Term/Long Term Goal(s): Medication stabilization, elimination of SI thoughts, and development of a comprehensive mental wellness plan.   Patient Goals:    Discharge Plan or Barriers: CSW will continue to follow up for appropriate referrals and possible discharge planning  Reason for Continuation of Hospitalization: Depression Hallucinations Medication stabilization  Estimated Length of Stay: 3-5 days  Attendees: Patient: 08/14/2018  Physician: Dr. Johnn Hai, MD 08/14/2018  Nursing: Elberta Fortis, RN 08/14/2018   RN Care Manager: 08/14/2018   Social Worker: Ardelle Anton, LCSW 08/14/2018   Recreational Therapist:  08/14/2018   Other:  08/14/2018   Other:  08/14/2018   Other: 08/14/2018      Scribe for Treatment Team: Trecia Rogers, LCSW 08/14/2018 1:11 PM

## 2018-08-14 NOTE — H&P (Signed)
Psychiatric Admission Assessment Adult  Patient Identification: Kendra Harris MRN:  [id number] Date of Evaluation:  08/14/2018 Chief Complaint:  MDD, Recurrent-Severe With Psychotic Features Principal Diagnosis: <principal problem not specified> Diagnosis:  Active Problems:   Major depressive disorder, recurrent episode, severe, with psychotic behavior (HCC)  History of Present Illness:  This is the second admission here but the first since 2003 for Kendra Harris, 38 year old single patient who presents with severe depressive symptoms, intrusive ruminations and thoughts about harming herself, and intrusive visions of seeing her self in a casket but these are not true hallucinations but rather an expression of the severity of her depressive illness. She is not currently on medication for depression/anxiety or psychosis but does have a history of being diagnosed with depression with psychotic features but again this may simply be referring to the intrusive imagery which is disturbing.  She is tearful on exam, she had presented to primary care on 7/1 concerned about sexually transmitted diseases.  The origin of this is that she states her partner was unfaithful and learned that this individual had another partner and that got her ruminating about the possibility of sexually transmitted diseases and she is eager for the results. Screen negative for all compounds tested, on review of systems she has essential hypertension.  Present she is alert oriented fully and cooperative tearful and anxious denies wanting to harm self here but states the thoughts are very intrusive and problematic for her and she would like them addressed. Associated Signs/Symptoms: Depression Symptoms:  psychomotor agitation, (Hypo) Manic Symptoms:  Distractibility, Anxiety Symptoms:  Excessive Worry, Psychotic Symptoms:  n/a PTSD Symptoms: NA Total Time spent with patient: 45 minutes  Past Psychiatric History: Prior  episode described as similar in the notes  Is the patient at risk to self? Yes.    Has the patient been a risk to self in the past 6 months? No.  Has the patient been a risk to self within the distant past? Yes.    Is the patient a risk to others? No.  Has the patient been a risk to others in the past 6 months? No.  Has the patient been a risk to others within the distant past? No.   Alcohol Screening:   Substance Abuse History in the last 12 months:  No. Consequences of Substance Abuse: NA Previous Psychotropic Medications: Yes  Psychological Evaluations: No  Past Medical History:  Past Medical History:  Diagnosis Date  . DELAYED MENSES 02/11/2010   Qualifier: Diagnosis of  By: Wallene Huh  MD, Rande Lawman    . Gestational diabetes   . Hypertension   . Urinary tract infection 03/04/2011    Past Surgical History:  Procedure Laterality Date  . NO PAST SURGERIES     Family History:  Family History  Problem Relation Age of Onset  . Sickle cell trait Sister    Family Psychiatric  History: neg Tobacco Screening:   Social History:  Social History   Substance and Sexual Activity  Alcohol Use No     Social History   Substance and Sexual Activity  Drug Use No    Additional Social History:                           Allergies:  No Known Allergies Lab Results:  Results for orders placed or performed during the hospital encounter of 08/14/18 (from the past 48 hour(s))  Comprehensive metabolic panel     Status: Abnormal  Collection Time: 08/14/18  4:29 AM  Result Value Ref Range   Sodium 138 135 - 145 mmol/L   Potassium 3.3 (L) 3.5 - 5.1 mmol/L   Chloride 103 98 - 111 mmol/L   CO2 24 22 - 32 mmol/L   Glucose, Bld 121 (H) 70 - 99 mg/dL   BUN 11 6 - 20 mg/dL   Creatinine, Ser 1.610.88 0.44 - 1.00 mg/dL   Calcium 9.3 8.9 - 09.610.3 mg/dL   Total Protein 8.2 (H) 6.5 - 8.1 g/dL   Albumin 3.8 3.5 - 5.0 g/dL   AST 15 15 - 41 U/L   ALT 14 0 - 44 U/L   Alkaline Phosphatase 71 38 -  126 U/L   Total Bilirubin 1.0 0.3 - 1.2 mg/dL   GFR calc non Af Amer >60 >60 mL/min   GFR calc Af Amer >60 >60 mL/min   Anion gap 11 5 - 15    Comment: Performed at [facility], [address] [place], [place] [address]  Ethanol     Status: None   Collection Time: 08/14/18  4:29 AM  Result Value Ref Range   Alcohol, Ethyl (B) <10 <10 mg/dL    Comment: (NOTE) Lowest detectable limit for serum alcohol is 10 mg/dL. For medical purposes only. Performed at [facility], [address] [place], [place] [address]   Salicylate level     Status: None   Collection Time: 08/14/18  4:29 AM  Result Value Ref Range   Salicylate Lvl <7.0 2.8 - 30.0 mg/dL    Comment: Performed at [facility], [address] [place], [place] [address]  Acetaminophen level     Status: Abnormal   Collection Time: 08/14/18  4:29 AM  Result Value Ref Range   Acetaminophen (Tylenol), Serum <10 (L) 10 - 30 ug/mL    Comment: (NOTE) Therapeutic concentrations vary significantly. A range of 10-30 ug/mL  may be an effective concentration for many patients. However, some  are best treated at concentrations outside of this range. Acetaminophen concentrations >150 ug/mL at 4 hours after ingestion  and >50 ug/mL at 12 hours after ingestion are often associated with  toxic reactions. Performed at [facility], [address] [place], [place] [address]   cbc     Status: Abnormal   Collection Time: 08/14/18  4:29 AM  Result Value Ref Range   WBC 7.8 4.0 - 10.5 K/uL   RBC 4.65 3.87 - 5.11 MIL/uL   Hemoglobin 11.3 (L) 12.0 - 15.0 g/dL   HCT 30.837.8 65.736.0 - 84.646.0 %   MCV 81.3 80.0 - 100.0 fL   MCH 24.3 (L) 26.0 - 34.0 pg   MCHC 29.9 (L) 30.0 - 36.0 g/dL   RDW 96.214.7 95.211.5 - 84.115.5 %   Platelets 242 150 - 400 K/uL   nRBC 0.0 0.0 - 0.2 %    Comment: Performed at [facility], [address] [place], [place] [address]  I-Stat beta hCG blood, ED     Status: None   Collection Time: 08/14/18  4:35  AM  Result Value Ref Range   I-stat hCG, quantitative <5.0 <5 mIU/mL   Comment 3            Comment:   GEST. AGE      CONC.  (mIU/mL)   <=1 WEEK        5 - 50     2 WEEKS  50 - 500     3 WEEKS       100 - 10,000     4 WEEKS     1,000 - 30,000        FEMALE AND NON-PREGNANT FEMALE:     LESS THAN 5 mIU/mL   Rapid urine drug screen (hospital performed)     Status: None   Collection Time: 08/14/18  4:55 AM  Result Value Ref Range   Opiates NONE DETECTED NONE DETECTED   Cocaine NONE DETECTED NONE DETECTED   Benzodiazepines NONE DETECTED NONE DETECTED   Amphetamines NONE DETECTED NONE DETECTED   Tetrahydrocannabinol NONE DETECTED NONE DETECTED   Barbiturates NONE DETECTED NONE DETECTED    Comment: (NOTE) DRUG SCREEN FOR MEDICAL PURPOSES ONLY.  IF CONFIRMATION IS NEEDED FOR ANY PURPOSE, NOTIFY LAB WITHIN 5 DAYS. LOWEST DETECTABLE LIMITS FOR URINE DRUG SCREEN Drug Class                     Cutoff (ng/mL) Amphetamine and metabolites    1000 Barbiturate and metabolites    200 Benzodiazepine                 200 Tricyclics and metabolites     300 Opiates and metabolites        300 Cocaine and metabolites        300 THC                            50 Performed at [facility], [address] [place], [place] [address]   SARS Coronavirus 2 (CEPHEID - Performed in [facility] hospital lab), Hosp Order     Status: None   Collection Time: 08/14/18  6:25 AM   Specimen: Nasopharyngeal Swab  Result Value Ref Range   SARS Coronavirus 2 NEGATIVE NEGATIVE    Comment: (NOTE) If result is NEGATIVE SARS-CoV-2 target nucleic acids are NOT DETECTED. The SARS-CoV-2 RNA is generally detectable in upper and lower  respiratory specimens during the acute phase of infection. The lowest  concentration of SARS-CoV-2 viral copies this assay can detect is 250  copies / mL. A negative result does not preclude SARS-CoV-2 infection  and should not be used as the sole basis for treatment or other   patient management decisions.  A negative result may occur with  improper specimen collection / handling, submission of specimen other  than nasopharyngeal swab, presence of viral mutation(s) within the  areas targeted by this assay, and inadequate number of viral copies  (<250 copies / mL). A negative result must be combined with clinical  observations, patient history, and epidemiological information. If result is POSITIVE SARS-CoV-2 target nucleic acids are DETECTED. The SARS-CoV-2 RNA is generally detectable in upper and lower  respiratory specimens dur ing the acute phase of infection.  Positive  results are indicative of active infection with SARS-CoV-2.  Clinical  correlation with patient history and other diagnostic information is  necessary to determine patient infection status.  Positive results do  not rule out bacterial infection or co-infection with other viruses. If result is PRESUMPTIVE POSTIVE SARS-CoV-2 nucleic acids MAY BE PRESENT.   A presumptive positive result was obtained on the submitted specimen  and confirmed on repeat testing.  While 2019 novel coronavirus  (SARS-CoV-2) nucleic acids may be present in the submitted sample  additional confirmatory testing may be necessary for epidemiological  and / or clinical management purposes  to differentiate between  SARS-CoV-2 and other Sarbecovirus currently known to infect humans.  If clinically indicated additional testing with an alternate test  methodology [phone]) is advised. The SARS-CoV-2 RNA is generally  detectable in upper and lower respiratory sp ecimens during the acute  phase of infection. The expected result is Negative. Fact Sheet for Patients:  StrictlyIdeas.no Fact Sheet for Healthcare Providers: BankingDealers.co.za This test is not yet approved or cleared by the [place] FDA and has been authorized for detection and/or diagnosis of SARS-CoV-2  by FDA under an Emergency Use Authorization (EUA).  This EUA will remain in effect (meaning this test can be used) for the duration of the COVID-19 declaration under Section 564(b)(1) of the Act, 21 U.S.C. section 360bbb-3(b)(1), unless the authorization is terminated or revoked sooner. Performed at [facility], [place] [address], [place], [place] [address]     Blood Alcohol level:  Lab Results  Component Value Date   ETH <10 18/56/3149    Metabolic Disorder Labs:  No results found for: HGBA1C, MPG No results found for: PROLACTIN Lab Results  Component Value Date   CHOL 219 (H) 11/09/2007   TRIG 83 11/09/2007   HDL 37 (L) 11/09/2007   CHOLHDL 5.9 Ratio 11/09/2007   VLDL 17 11/09/2007   LDLCALC 165 (H) 11/09/2007    Current Medications: Current Facility-Administered Medications  Medication Dose Route Frequency Provider Last Rate Last Dose  . acetaminophen (TYLENOL) tablet 650 mg  650 mg Oral Q6H PRN Dixon, Rashaun M, NP      . alum & mag hydroxide-simeth (MAALOX/MYLANTA) 200-200-20 MG/5ML suspension 30 mL  30 mL Oral Q4H PRN Dixon, Rashaun M, NP      . amLODipine (NORVASC) tablet 10 mg  10 mg Oral Daily Johnn Hai, MD      . clonazePAM Bobbye [place]) tablet 0.5 mg  0.5 mg Oral BID Johnn Hai, MD      . cloNIDine (CATAPRES) tablet 0.2 mg  0.2 mg Oral Q4H PRN Johnn Hai, MD      . FLUoxetine (PROZAC) capsule 20 mg  20 mg Oral Daily Johnn Hai, MD      . hydrOXYzine (ATARAX/VISTARIL) tablet 50 mg  50 mg Oral Q4H PRN Johnn Hai, MD      . magnesium hydroxide (MILK OF MAGNESIA) suspension 30 mL  30 mL Oral Daily PRN Dixon, Rashaun M, NP      . prenatal vitamin w/FE, FA (NATACHEW) chewable tablet 1 tablet  1 tablet Oral Q1200 Johnn Hai, MD      . propranolol (INDERAL) tablet 40 mg  40 mg Oral BID Johnn Hai, MD      . temazepam (RESTORIL) capsule 30 mg  30 mg Oral QHS Johnn Hai, MD       PTA Medications: Medications Prior to Admission  Medication Sig Dispense  Refill Last Dose  . acetaminophen (TYLENOL) 325 MG tablet Take 2 tablets (650 mg total) by mouth every 4 (four) hours as needed (for pain scale < 4). 30 tablet 0   . amLODipine (NORVASC) 10 MG tablet Take 1 tablet (10 mg total) by mouth daily. Needs to make a BP follow up for further prescriptions (Patient not taking: Reported on 08/14/2018) 30 tablet 0   . hydrochlorothiazide (HYDRODIURIL) 25 MG tablet Take 1 tablet (25 mg total) by mouth daily. Needs to make a BP follow up for further prescriptions. 90 tablet 3     Musculoskeletal: Strength & Muscle Tone: within normal limits Gait & Station: normal  Patient leans: N/A  Psychiatric Specialty Exam: Physical Exam noted to be hypertensive  ROS cardiovascular positive for hypertension on 2 antihypertensive meds/neurological negative for head injury or seizure/endocrine negative for thyroid disease/GI/GU excessive worry about possibly sexually transmitted disease  Blood pressure (!) 134/108, pulse 75, temperature 98.2 F (36.8 C), temperature source Oral, resp. rate 16, last menstrual period 08/09/2018, SpO2 100 %, unknown if currently breastfeeding.There is no height or weight on file to calculate BMI.  General Appearance: Casual  Eye Contact:  Good  Speech:  Clear and Coherent  Volume:  Normal  Mood:  Anxious and Depressed  Affect:  Congruent and Tearful  Thought Process:  Coherent, Linear and Descriptions of Associations: Circumstantial  Orientation:  Full (Time, Place, and Person)  Thought Content:  Tangential  Suicidal Thoughts:  Yes.  without intent/plan  Homicidal Thoughts:  No  Memory:  Immediate;   Good  Judgement:  Good  Insight:  Good  Psychomotor Activity:  Normal  Concentration:  Concentration: Good  Recall:  Good  Fund of Knowledge:  Good  Language:  Good  Akathisia:  n/a  Handed:  Right  AIMS (if indicated):     Assets:  Communication Skills Desire for Improvement Financial Resources/Insurance Housing Physical  Health Resilience  ADL's:  Intact  Cognition:  WNL  Sleep:       Treatment Plan Summary: Daily contact with patient to assess and evaluate symptoms and progress in treatment and Medication management  Observation Level/Precautions:  15 minute checks  Laboratory:  UDS  Psychotherapy: Cognitive-based  Medications: Begin B vitamins and antidepressant therapy as well as medication for anxiety  Consultations: Not applicable  Discharge Concerns: Longer-term stability  Estimated LOS: 3-5  Other: Axis I depression recurrent severe without psychosis but with recurrent intrusive and disturbing imagery and thoughts   Physician Treatment Plan for Primary Diagnosis: <principal problem not specified> Long Term Goal(s): Improvement in symptoms so as ready for discharge  Short Term Goals: Ability to disclose and discuss suicidal ideas, Ability to demonstrate self-control will improve and Ability to identify and develop effective coping behaviors will improve  Physician Treatment Plan for Secondary Diagnosis: Active Problems:   Major depressive disorder, recurrent episode, severe, with psychotic behavior (HCC)  Long Term Goal(s): Improvement in symptoms so as ready for discharge  Short Term Goals: Ability to identify and develop effective coping behaviors will improve, Ability to maintain clinical measurements within normal limits will improve, Compliance with prescribed medications will improve and Ability to identify triggers associated with substance abuse/mental health issues will improve  I certify that inpatient services furnished can reasonably be expected to improve the patient's condition.    Malvin JohnsFARAH,Myangel Summons, MD 7/6/202011:33 AM

## 2018-08-14 NOTE — BH Assessment (Addendum)
Tele Assessment Note   Patient Name: Kendra Harris MRN: [id number] Referring Physician: Nira ConnPedro Eduardo Cardama, MD Location of Patient: [facility], [phone] Location of Provider: Behavioral Health TTS Department  Kendra Harris is an 38 y.o. single female who presents unaccompanied to [facility] reporting symptoms of depression, suicidal ideation and psychotic symptoms. Pt has a history of major depression with psychotic features and says she has not taken psychiatric medications in approximately two years. Pt is very tearful and anxious. She says one week ago she began feeling severely depressed and experiencing voices telling her that she should kill herself. She says she is having visions of herself dead in a casket. She says she recently had a new sexual partner and believes she has contracted a disease despite no symptoms and negative STI tests. She says these symptoms coincided with a neighbor who died but Pt says she was not close with the neighbor. Pt acknowledges symptoms including crying spells, social withdrawal, loss of interest in usual pleasures, fatigue, irritability, decreased concentration, decreased sleep, decreased appetite and feelings of worthlessness. She says she is not eating and barely sleeping. She denies any history of previous suicide attempts. She denies intentional self-injurious behaviors. She denies current homicidal ideation or history of aggression. She denies alcohol or other substance use.   Pt cannot identify any stressors other than her mental health symptoms. She says she lives with her mother and her five children, ages 6113, 6212, 218, 3 and 2. She identifies her mother as her primary support. She says she is receiving disability benefits. She denies any history of abuse or trauma. She denies legal problems. She denies access to firearms.  Pt was last psychiatrically hospitalized in 2003 at [facility]. Pt's current symptoms are very similar to those in 2003. She  says she has no outpatient mental health providers but has received treatment [facility] in the past.  Pt is dressed in hospital scrubs, alert and oriented x4. Pt speaks in a clear tone, at moderate volume and normal pace. Motor behavior appears normal. Eye contact is good and she is tearful. Pt's mood is depressed, anxious and fearful; affect is congruent with mood. Thought process is coherent and relevant. Pt states she doesn't feel safe and is willing to sign voluntarily into a psychiatric facility.    Diagnosis: F33.3 Major depressive disorder, Recurrent episode, With psychotic features  Past Medical History:  Past Medical History:  Diagnosis Date  . DELAYED MENSES 02/11/2010   Qualifier: Diagnosis of  By: Wallene Huharew  MD, Rande LawmanKhary    . Gestational diabetes   . Hypertension   . Urinary tract infection 03/04/2011    Past Surgical History:  Procedure Laterality Date  . NO PAST SURGERIES      Family History:  Family History  Problem Relation Age of Onset  . Sickle cell trait Sister     Social History:  reports that she has never smoked. She has never used smokeless tobacco. She reports that she does not drink alcohol or use drugs.  Additional Social History:  Alcohol / Drug Use Pain Medications: Denies use Prescriptions: Denies use Over the Counter: Denies use History of alcohol / drug use?: No history of alcohol / drug abuse Longest period of sobriety (when/how long): NA  CIWA: CIWA-Ar BP: (!) 180/115 Pulse Rate: 73 COWS:    Allergies: No Known Allergies  Home Medications: (Not in a hospital admission)   OB/GYN Status:  Patient's last menstrual period was 08/09/2018.  General Assessment Data [facility] TTS Assessment: In system Is this a Tele or Face-to-Face Assessment?: Tele Assessment Is this an Initial Assessment or a Re-assessment for this encounter?: Initial Assessment Patient Accompanied by:: N/A Language Other than English:  No Living Arrangements: Other (Comment)(Lives with mother and children) What gender do you identify as?: Female Marital status: Single Maiden name: Senaida OresRichardson Pregnancy Status: No Living Arrangements: Parent, Children Can pt return to current living arrangement?: Yes Admission Status: Voluntary Is patient capable of signing voluntary admission?: Yes Referral Source: Self/Family/Friend Insurance type: Medicaid     Crisis Care Plan Living Arrangements: Parent, Children Legal Guardian: Other:(Self) Name of Psychiatrist: None Name of Therapist: None  Education Status Is patient currently in school?: No Is the patient employed, unemployed or receiving disability?: Receiving disability income  Risk to self with the past 6 months Suicidal Ideation: Yes-Currently Present Has patient been a risk to self within the past 6 months prior to admission? : Yes Suicidal Intent: No Has patient had any suicidal intent within the past 6 months prior to admission? : No Is patient at risk for suicide?: Yes Suicidal Plan?: No Has patient had any suicidal plan within the past 6 months prior to admission? : No Access to Means: No What has been your use of drugs/alcohol within the last 12 months?: Pt denies Previous Attempts/Gestures: No How many times?: 0 Other Self Harm Risks: None Triggers for Past Attempts: None known Intentional Self Injurious Behavior: None Family Suicide History: No Recent stressful life event(s): Other (Comment)(Cannot identify stressor) Persecutory voices/beliefs?: Yes Depression: Yes Depression Symptoms: Despondent, Insomnia, Tearfulness, Isolating, Fatigue, Guilt, Loss of interest in usual pleasures, Feeling worthless/self pity, Feeling angry/irritable Substance abuse history and/or treatment for substance abuse?: No Suicide prevention information given to non-admitted patients: Not applicable  Risk to Others within the past 6 months Homicidal Ideation: No Does  patient have any lifetime risk of violence toward others beyond the six months prior to admission? : No Thoughts of Harm to Others: No Current Homicidal Intent: No Current Homicidal Plan: No Access to Homicidal Means: No Identified Victim: None History of harm to others?: No Assessment of Violence: None Noted Violent Behavior Description: Pt denies history of violence Does patient have access to weapons?: No Criminal Charges Pending?: No Does patient have a court date: No Is patient on probation?: No  Psychosis Hallucinations: Auditory, With command(Voices telling her to kill herself) Delusions: Somatic(Believes she has a STI when tests are negative)  Mental Status Report Appearance/Hygiene: In scrubs Eye Contact: Good Motor Activity: Unremarkable Speech: Incoherent Level of Consciousness: Alert, Crying Mood: Depressed, Anxious, Fearful Affect: Depressed, Anxious, Fearful Anxiety Level: Severe Thought Processes: Coherent, Relevant Judgement: Impaired Orientation: Person, Place, Time, Situation Obsessive Compulsive Thoughts/Behaviors: None  Cognitive Functioning Concentration: Normal Memory: Recent Intact, Remote Intact Is patient IDD: No Insight: Fair Impulse Control: Fair Appetite: Poor Have you had any weight changes? : No Change Sleep: Decreased Total Hours of Sleep: 3 Vegetative Symptoms: None  ADLScreening [facility] Assessment Services) Patient's cognitive ability adequate to safely complete daily activities?: Yes Patient able to express need for assistance with ADLs?: Yes Independently performs ADLs?: Yes (appropriate for developmental age)  Prior Inpatient Therapy Prior Inpatient Therapy: Yes Prior Therapy Dates: 2003 [facility] Reason for Treatment: MDD with psychotic features  Prior Outpatient Therapy Prior Outpatient Therapy: Yes Prior Therapy Dates: 2003-2018 [facility] Reason for Treatment: MDD Does patient have an ACCT team?: No Does  patient have Intensive In-House Services?  : No Does patient have [facility] services? : No Does patient have P4CC services?: No  ADL Screening (condition at time of admission) Patient's cognitive ability adequate to safely complete daily activities?: Yes Is the patient deaf or have difficulty hearing?: No Does the patient have difficulty seeing, even when wearing glasses/contacts?: No Does the patient have difficulty concentrating, remembering, or making decisions?: No Patient able to express need for assistance with ADLs?: Yes Does the patient have difficulty dressing or bathing?: No Independently performs ADLs?: Yes (appropriate for developmental age) Does the patient have difficulty walking or climbing stairs?: No Weakness of Legs: None Weakness of Arms/Hands: None  Home Assistive Devices/Equipment Home Assistive Devices/Equipment: None    Abuse/Neglect Assessment (Assessment to be complete while patient is alone) Abuse/Neglect Assessment Can Be Completed: Yes Physical Abuse: Denies Verbal Abuse: Denies Sexual Abuse: Denies Exploitation of patient/patient's resources: Denies Self-Neglect: Denies     Regulatory affairs officer (For Healthcare) Does Patient Have a Medical Advance Directive?: No Would patient like information on creating a medical advance directive?: No - Patient declined          Disposition: Gave clinical report to Anette Riedel, NP who said Pt meets criteria for inpatient psychiatric treatment. Lavell Luster, [facility] at [facility], says a bed will be available this morning and requests another set of vitals. Notified Dr Sibyl Parr and Edward Qualia, RN of acceptance.  Disposition Initial Assessment Completed for this Encounter: Yes  This service was provided via telemedicine using a 2-way, interactive audio and video technology.  Names of all persons participating in this telemedicine service  and their role in this encounter. Name: Kendra Harris Role: Patient  Name: Storm Frisk, [facility] Role: TTS counselor         Orpah Greek Anson Fret, [facility], [facility], [facility] Triage Specialist [phone]  Evelena Peat 08/14/2018 6:09 AM

## 2018-08-14 NOTE — ED Notes (Signed)
[facility] called for transport to [facility].  

## 2018-08-14 NOTE — [facility] Suicide Risk Assessment (Signed)
[facility] Admission Suicide Risk Assessment   Total Time spent with patient: 45 minutes Principal Problem: Depression recurrent severe Diagnosis:  Active Problems:   Major depressive disorder, recurrent episode, severe, with psychotic behavior ([place])  Subjective Data: Admitted voluntarily due to the severity of depressive symptoms with intrusive disturbing imagery and thoughts of suicide Continued Clinical Symptoms:    The "Alcohol Use Disorders Identification Test", Guidelines for Use in Primary Care, Second Edition.  World Pharmacologist [facility]). Score between 0-7:  no or low risk or alcohol related problems. Score between 8-15:  moderate risk of alcohol related problems. Score between 16-19:  high risk of alcohol related problems. Score 20 or above:  warrants further diagnostic evaluation for alcohol dependence and treatment.   CLINICAL FACTORS:   Depression:   Anhedonia   Musculoskeletal: Strength & Muscle Tone: within normal limits Gait & Station: normal Patient leans: N/A  Psychiatric Specialty Exam: Physical Exam noted to be hypertensive  ROS cardiovascular positive for hypertension on 2 antihypertensive meds/neurological negative for head injury or seizure/endocrine negative for thyroid disease/GI/GU excessive worry about possibly sexually transmitted disease  Blood pressure (!) 134/108, pulse 75, temperature 98.2 F (36.8 C), temperature source Oral, resp. rate 16, last menstrual period 08/09/2018, SpO2 100 %, unknown if currently breastfeeding.There is no height or weight on file to calculate BMI.  General Appearance: Casual  Eye Contact:  Good  Speech:  Clear and Coherent  Volume:  Normal  Mood:  Anxious and Depressed  Affect:  Congruent and Tearful  Thought Process:  Coherent, Linear and Descriptions of Associations: Circumstantial  Orientation:  Full (Time, Place, and Person)  Thought Content:  Tangential  Suicidal Thoughts:  Yes.  without intent/plan  Homicidal  Thoughts:  No  Memory:  Immediate;   Good  Judgement:  Good  Insight:  Good  Psychomotor Activity:  Normal  Concentration:  Concentration: Good  Recall:  Good  Fund of Knowledge:  Good  Language:  Good  Akathisia:  n/a  Handed:  Right  AIMS (if indicated):     Assets:  Communication Skills Desire for Improvement Financial Resources/Insurance Housing Physical Health Resilience  ADL's:  Intact  Cognition:  WNL  Sleep:        COGNITIVE FEATURES THAT CONTRIBUTE TO RISK:  None    SUICIDE RISK:   Moderate:  Frequent suicidal ideation with limited intensity, and duration, some specificity in terms of plans, no associated intent, good self-control, limited dysphoria/symptomatology, some risk factors present, and identifiable protective factors, including available and accessible social support.  PLAN OF CARE: Begin present therapy every 15 checks can contract for safety here understands what that means  I certify that inpatient services furnished can reasonably be expected to improve the patient's condition.   Johnn Hai, MD 08/14/2018, 11:38 AM

## 2018-08-15 DIAGNOSIS — F333 Major depressive disorder, recurrent, severe with psychotic symptoms: Principal | ICD-10-CM

## 2018-08-15 LAB — TSH: TSH: 2.79 u[IU]/mL (ref 0.350–4.500)

## 2018-08-15 MED ORDER — PROPRANOLOL HCL 80 MG PO TABS
80.0000 mg | ORAL_TABLET | Freq: Two times a day (BID) | ORAL | Status: DC
  Administered 2018-08-15 – 2018-08-21 (×11): 80 mg via ORAL
  Filled 2018-08-15 (×14): qty 1

## 2018-08-15 MED ORDER — METRONIDAZOLE 500 MG PO TABS
500.0000 mg | ORAL_TABLET | Freq: Two times a day (BID) | ORAL | Status: DC
  Administered 2018-08-15 – 2018-08-21 (×13): 500 mg via ORAL
  Filled 2018-08-15 (×2): qty 1
  Filled 2018-08-15: qty 2
  Filled 2018-08-15 (×12): qty 1

## 2018-08-15 MED ORDER — MODAFINIL 200 MG PO TABS
100.0000 mg | ORAL_TABLET | Freq: Every day | ORAL | Status: DC
  Administered 2018-08-15 – 2018-08-16 (×2): 100 mg via ORAL
  Filled 2018-08-15 (×2): qty 1

## 2018-08-15 NOTE — Progress Notes (Signed)
[facility] MD Progress Note  08/15/2018 11:01 AM Kendra Harris  MRN:  [id number] Subjective:   Dysphoric but no longer tearful discussed intrusive ruminations and suicidal thoughts now passive no plans today but discussed plans prior to admission.  Contracting, much improved thus far but not baseline.  Augmentation strategies to be discussed further  Principal Problem: <principal problem not specified> Diagnosis: Active Problems:   Major depressive disorder, recurrent episode, severe, with psychotic behavior ([place])  Total Time spent with patient: 20 minutes  Past Psychiatric History: recurrent depression  Past Medical History:  Past Medical History:  Diagnosis Date  . DELAYED MENSES 02/11/2010   Qualifier: Diagnosis of  By: Sherilyn Cooter  MD, Hosie Poisson    . Gestational diabetes   . Hypertension   . Urinary tract infection 03/04/2011    Past Surgical History:  Procedure Laterality Date  . NO PAST SURGERIES     Family History:  Family History  Problem Relation Age of Onset  . Sickle cell trait Sister    Family Psychiatric  History: neg Social History:  Social History   Substance and Sexual Activity  Alcohol Use No     Social History   Substance and Sexual Activity  Drug Use No    Social History   Socioeconomic History  . Marital status: Single    Spouse name: Not on file  . Number of children: Not on file  . Years of education: Not on file  . Highest education level: Not on file  Occupational History  . Not on file  Social Needs  . Financial resource strain: Not on file  . Food insecurity    Worry: Not on file    Inability: Not on file  . Transportation needs    Medical: Not on file    Non-medical: Not on file  Tobacco Use  . Smoking status: Never Smoker  . Smokeless tobacco: Never Used  Substance and Sexual Activity  . Alcohol use: No  . Drug use: No  . Sexual activity: Yes    Birth control/protection: Condom  Lifestyle  . Physical activity    Days per week: Not on  file    Minutes per session: Not on file  . Stress: Not on file  Relationships  . Social Herbalist on phone: Not on file    Gets together: Not on file    Attends religious service: Not on file    Active member of club or organization: Not on file    Attends meetings of clubs or organizations: Not on file    Relationship status: Not on file  Other Topics Concern  . Not on file  Social History Narrative  . Not on file   Additional Social History:                         Sleep: Good  Appetite:  Good  Current Medications: Current Facility-Administered Medications  Medication Dose Route Frequency Provider Last Rate Last Dose  . acetaminophen (TYLENOL) tablet 650 mg  650 mg Oral Q6H PRN Deloria Lair, NP   650 mg at 08/14/18 1740  . alum & mag hydroxide-simeth (MAALOX/MYLANTA) 200-200-20 MG/5ML suspension 30 mL  30 mL Oral Q4H PRN Dixon, Rashaun M, NP      . amLODipine (NORVASC) tablet 10 mg  10 mg Oral Daily Johnn Hai, MD   10 mg at 08/15/18 0811  . clonazePAM (KLONOPIN) tablet 0.5 mg  0.5 mg Oral  BID Malvin JohnsFarah, Kallista Pae, MD   0.5 mg at 08/15/18 0810  . cloNIDine (CATAPRES) tablet 0.2 mg  0.2 mg Oral Q4H PRN Malvin JohnsFarah, Develle Sievers, MD   0.2 mg at 08/15/18 0653  . FLUoxetine (PROZAC) capsule 20 mg  20 mg Oral Daily Malvin JohnsFarah, Tarun Patchell, MD   20 mg at 08/15/18 0811  . hydrOXYzine (ATARAX/VISTARIL) tablet 50 mg  50 mg Oral Q4H PRN Malvin JohnsFarah, Tomasa Dobransky, MD   50 mg at 08/14/18 2147  . magnesium hydroxide (MILK OF MAGNESIA) suspension 30 mL  30 mL Oral Daily PRN Jearld Leschixon, Rashaun M, NP      . prenatal multivitamin tablet 1 tablet  1 tablet Oral Daily Malvin JohnsFarah, Oakley Orban, MD   1 tablet at 08/15/18 [phone]  . propranolol (INDERAL) tablet 80 mg  80 mg Oral BID Malvin JohnsFarah, Demitrious Mccannon, MD      . temazepam (RESTORIL) capsule 30 mg  30 mg Oral QHS Malvin JohnsFarah, Ascencion Stegner, MD   30 mg at 08/14/18 2147    Lab Results:  Results for orders placed or performed during the hospital encounter of 08/14/18 (from the past 48 hour(s))   Comprehensive metabolic panel     Status: Abnormal   Collection Time: 08/14/18  4:29 AM  Result Value Ref Range   Sodium 138 135 - 145 mmol/L   Potassium 3.3 (L) 3.5 - 5.1 mmol/L   Chloride 103 98 - 111 mmol/L   CO2 24 22 - 32 mmol/L   Glucose, Bld 121 (H) 70 - 99 mg/dL   BUN 11 6 - 20 mg/dL   Creatinine, Ser 9.600.88 0.44 - 1.00 mg/dL   Calcium 9.3 8.9 - 45.410.3 mg/dL   Total Protein 8.2 (H) 6.5 - 8.1 g/dL   Albumin 3.8 3.5 - 5.0 g/dL   AST 15 15 - 41 U/L   ALT 14 0 - 44 U/L   Alkaline Phosphatase 71 38 - 126 U/L   Total Bilirubin 1.0 0.3 - 1.2 mg/dL   GFR calc non Af Amer >60 >60 mL/min   GFR calc Af Amer >60 >60 mL/min   Anion gap 11 5 - 15    Comment: Performed at [facility], [address] [place], [place] [address]  Ethanol     Status: None   Collection Time: 08/14/18  4:29 AM  Result Value Ref Range   Alcohol, Ethyl (B) <10 <10 mg/dL    Comment: (NOTE) Lowest detectable limit for serum alcohol is 10 mg/dL. For medical purposes only. Performed at [facility], [address] [place], [place] [address]   Salicylate level     Status: None   Collection Time: 08/14/18  4:29 AM  Result Value Ref Range   Salicylate Lvl <7.0 2.8 - 30.0 mg/dL    Comment: Performed at [facility], [address] [place], [place] [address]  Acetaminophen level     Status: Abnormal   Collection Time: 08/14/18  4:29 AM  Result Value Ref Range   Acetaminophen (Tylenol), Serum <10 (L) 10 - 30 ug/mL    Comment: (NOTE) Therapeutic concentrations vary significantly. A range of 10-30 ug/mL  may be an effective concentration for many patients. However, some  are best treated at concentrations outside of this range. Acetaminophen concentrations >150 ug/mL at 4 hours after ingestion  and >50 ug/mL at 12 hours after ingestion are often associated with  toxic reactions. Performed at [facility], [address] [place], [place] [address]   cbc     Status:  Abnormal    Collection Time: 08/14/18  4:29 AM  Result Value Ref Range   WBC 7.8 4.0 - 10.5 K/uL   RBC 4.65 3.87 - 5.11 MIL/uL   Hemoglobin 11.3 (L) 12.0 - 15.0 g/dL   HCT 16.1 09.6 - 04.5 %   MCV 81.3 80.0 - 100.0 fL   MCH 24.3 (L) 26.0 - 34.0 pg   MCHC 29.9 (L) 30.0 - 36.0 g/dL   RDW 40.9 81.1 - 91.4 %   Platelets 242 150 - 400 K/uL   nRBC 0.0 0.0 - 0.2 %    Comment: Performed at [facility], [address], [place], [place] [address]  I-Stat beta hCG blood, ED     Status: None   Collection Time: 08/14/18  4:35 AM  Result Value Ref Range   I-stat hCG, quantitative <5.0 <5 mIU/mL   Comment 3            Comment:   GEST. AGE      CONC.  (mIU/mL)   <=1 WEEK        5 - 50     2 WEEKS       50 - 500     3 WEEKS       100 - 10,000     4 WEEKS     1,000 - 30,000        FEMALE AND NON-PREGNANT FEMALE:     LESS THAN 5 mIU/mL   Rapid urine drug screen (hospital performed)     Status: None   Collection Time: 08/14/18  4:55 AM  Result Value Ref Range   Opiates NONE DETECTED NONE DETECTED   Cocaine NONE DETECTED NONE DETECTED   Benzodiazepines NONE DETECTED NONE DETECTED   Amphetamines NONE DETECTED NONE DETECTED   Tetrahydrocannabinol NONE DETECTED NONE DETECTED   Barbiturates NONE DETECTED NONE DETECTED    Comment: (NOTE) DRUG SCREEN FOR MEDICAL PURPOSES ONLY.  IF CONFIRMATION IS NEEDED FOR ANY PURPOSE, NOTIFY LAB WITHIN 5 DAYS. LOWEST DETECTABLE LIMITS FOR URINE DRUG SCREEN Drug Class                     Cutoff (ng/mL) Amphetamine and metabolites    1000 Barbiturate and metabolites    200 Benzodiazepine                 200 Tricyclics and metabolites     300 Opiates and metabolites        300 Cocaine and metabolites        300 THC                            50 Performed at [facility], [address], [place], [place] [address]   SARS Coronavirus 2 (CEPHEID - Performed in [facility] hospital lab), Hosp Order     Status: None   Collection Time: 08/14/18  6:25 AM    Specimen: Nasopharyngeal Swab  Result Value Ref Range   SARS Coronavirus 2 NEGATIVE NEGATIVE    Comment: (NOTE) If result is NEGATIVE SARS-CoV-2 target nucleic acids are NOT DETECTED. The SARS-CoV-2 RNA is generally detectable in upper and lower  respiratory specimens during the acute phase of infection. The lowest  concentration of SARS-CoV-2 viral copies this assay can detect is 250  copies / mL. A negative result does not preclude SARS-CoV-2 infection  and should not be used as the sole basis for treatment  or other  patient management decisions.  A negative result may occur with  improper specimen collection / handling, submission of specimen other  than nasopharyngeal swab, presence of viral mutation(s) within the  areas targeted by this assay, and inadequate number of viral copies  (<250 copies / mL). A negative result must be combined with clinical  observations, patient history, and epidemiological information. If result is POSITIVE SARS-CoV-2 target nucleic acids are DETECTED. The SARS-CoV-2 RNA is generally detectable in upper and lower  respiratory specimens dur ing the acute phase of infection.  Positive  results are indicative of active infection with SARS-CoV-2.  Clinical  correlation with patient history and other diagnostic information is  necessary to determine patient infection status.  Positive results do  not rule out bacterial infection or co-infection with other viruses. If result is PRESUMPTIVE POSTIVE SARS-CoV-2 nucleic acids MAY BE PRESENT.   A presumptive positive result was obtained on the submitted specimen  and confirmed on repeat testing.  While 2019 novel coronavirus  (SARS-CoV-2) nucleic acids may be present in the submitted sample  additional confirmatory testing may be necessary for epidemiological  and / or clinical management purposes  to differentiate between  SARS-CoV-2 and other Sarbecovirus currently known to infect humans.  If clinically  indicated additional testing with an alternate test  methodology [id number]) is advised. The SARS-CoV-2 RNA is generally  detectable in upper and lower respiratory sp ecimens during the acute  phase of infection. The expected result is Negative. Fact Sheet for Patients:  BoilerBrush.com.cyhttps://www.fda.gov/media/136312/download Fact Sheet for Healthcare Providers: https://pope.com/https://www.fda.gov/media/136313/download This test is not yet approved or cleared by the [place] FDA and has been authorized for detection and/or diagnosis of SARS-CoV-2 by FDA under an Emergency Use Authorization (EUA).  This EUA will remain in effect (meaning this test can be used) for the duration of the COVID-19 declaration under Section 564(b)(1) of the Act, 21 U.S.C. section 360bbb-3(b)(1), unless the authorization is terminated or revoked sooner. Performed at [facility], [address] [place], [place] [address]     Blood Alcohol level:  Lab Results  Component Value Date   ETH <10 08/14/2018    Metabolic Disorder Labs: No results found for: HGBA1C, MPG No results found for: PROLACTIN Lab Results  Component Value Date   CHOL 219 (H) 11/09/2007   TRIG 83 11/09/2007   HDL 37 (L) 11/09/2007   CHOLHDL 5.9 Ratio 11/09/2007   VLDL 17 11/09/2007   LDLCALC 165 (H) 11/09/2007    Physical Findings: AIMS: Facial and Oral Movements Muscles of Facial Expression: None, normal Lips and Perioral Area: None, normal Jaw: None, normal Tongue: None, normal,Extremity Movements Upper (arms, wrists, hands, fingers): None, normal Lower (legs, knees, ankles, toes): None, normal, Trunk Movements Neck, shoulders, hips: None, normal, Overall Severity Severity of abnormal movements (highest score from questions above): None, normal Incapacitation due to abnormal movements: None, normal Patient's awareness of abnormal movements (rate only patient's report): No Awareness, Dental Status Current problems with teeth and/or dentures?:  No Does patient usually wear dentures?: No  CIWA:    COWS:     Musculoskeletal: Strength & Muscle Tone: within normal limits Gait & Station: normal Patient leans: N/A  Psychiatric Specialty Exam: Physical Exam  ROS  Blood pressure (!) 155/110, pulse (!) 52, temperature 98.2 F (36.8 C), temperature source Oral, resp. rate 16, height 5\' 2"  (1.575 m), weight 90.3 kg, last menstrual period 08/09/2018, SpO2 100 %, unknown if currently breastfeeding.Body mass index is 36.4 kg/m.  General Appearance: Casual  Eye Contact:  Good  Speech:  Clear and Coherent  Volume:  Decreased  Mood:  Dysphoric  Affect:  Appropriate and Congruent  Thought Process:  Linear and Descriptions of Associations: Intact  Orientation:  Other:  ox3  Thought Content:  Logical  Suicidal Thoughts:  Yes.  without intent/plan  Homicidal Thoughts:  No  Memory:  Recent;   Good  Judgement:  Good  Insight:  Good  Psychomotor Activity:  Normal  Concentration:  Concentration: Good  Recall:  Good  Fund of Knowledge:  nl  Language:  Good  Akathisia:  Negative  Handed:  Right  AIMS (if indicated):     Assets:  Desire for Improvement Financial Resources/Insurance Intimacy Leisure Time Physical Health Resilience Social Support  ADL's:  Intact  Cognition:  WNL  Sleep:  Number of Hours: 6.75     Treatment Plan Summary: Daily contact with patient to assess and evaluate symptoms and progress in treatment and Medication management continue fluoxetine escalate as tolerated or if needed and further add modafinil to Augmentin speech response continue clonazepam temporarily continue cognitive therapy and current precautions  Luz Burcher, MD 08/15/2018, 11:01 AM

## 2018-08-15 NOTE — Progress Notes (Signed)
Did not attend group 

## 2018-08-15 NOTE — Progress Notes (Signed)
[place] NOVEL CORONAVIRUS (COVID-19) DAILY CHECK-OFF SYMPTOMS - answer yes or no to each - every day NO YES  Have you had a fever in the past 24 hours?  . Fever (Temp > 37.80C / 100F) X   Have you had any of these symptoms in the past 24 hours? . New Cough .  Sore Throat  .  Shortness of Breath .  Difficulty Breathing .  Unexplained Body Aches   X   Have you had any one of these symptoms in the past 24 hours not related to allergies?   . Runny Nose .  Nasal Congestion .  Sneezing   X   If you have had runny nose, nasal congestion, sneezing in the past 24 hours, has it worsened?  X   EXPOSURES - check yes or no X   Have you traveled outside the state in the past 14 days?  X   Have you been in contact with someone with a confirmed diagnosis of COVID-19 or PUI in the past 14 days without wearing appropriate PPE?  X   Have you been living in the same home as a person with confirmed diagnosis of COVID-19 or a PUI (household contact)?    X   Have you been diagnosed with COVID-19?    X              What to do next: Answered NO to all: Answered YES to anything:   Proceed with unit schedule Follow the [facility] Inpatient Flowsheet.   

## 2018-08-15 NOTE — Plan of Care (Signed)
Progress note  Pt found in bed; compliant with medication administration. Pt states her unsteadiness/dizziness hasn't subsided. Pt instructed to push fluids and safety precautions reviewed. Pt denies any physical pain. Pt has been in their bed most of the day. Pt seems worried, anxious, and sullen. Pt denies si/hi/ah/vh and verbally agrees to approach staff if these become apparent or before harming himself/others while at [place]: Pt provided support and encouragement. Pt given medication per protocol and standing orders. Q16m safety checks implemented and continued.  R: Pt safe on the unit. Will continue to monitor.  Pt progressing in the following metrics  Problem: Activity: Goal: Interest or engagement in leisure activities will improve Outcome: Progressing Goal: Imbalance in normal sleep/wake cycle will improve Outcome: Progressing   Problem: Self-Concept: Goal: Will verbalize positive feelings about self Outcome: Progressing

## 2018-08-15 NOTE — Progress Notes (Signed)
Spiritual care group on grief and loss facilitated by chaplain Zakyra Kukuk  Group Goal:  Support / Education around grief and loss Members engage in facilitated group support and psycho-social education.  Group Description:  Following introductions and group rules, group members engaged in facilitated group dialog and support around topic of loss, with particular support around experiences of loss in their lives. Group Identified types of loss (relationships / self / things) and identified patterns, circumstances, and changes that precipitate losses. Reflected on thoughts / feelings around loss, normalized grief responses, and recognized variety in grief experience. Patient Progress: Did not attend group 

## 2018-08-15 NOTE — [facility] Counselor (Signed)
Adult Comprehensive Assessment  Patient ID: Kendra Harris, female   DOB: 07/21/1980, 38 y.o.   MRN: [id number]  Information Source: Information source: Patient  Current Stressors:  Patient states their primary concerns and needs for treatment are:: "get back to being me-I just want my mind back" Patient states their goals for this hospitilization and ongoing recovery are:: see Physical health (include injuries & life threatening diseases): Pt trying to get results back on HIV test. Social relationships: Pt and boyfriend broke up in April  Living/Environment/Situation:  Living Arrangements: Parent Living conditions (as described by patient or guardian): going fine Who else lives in the home?: mother, brother, pt and pt's 5 children How long has patient lived in current situation?: 2.5 months What is atmosphere in current home: Comfortable, Supportive  Family History:  Marital status: Single Are you sexually active?: Yes What is your sexual orientation?: heterosexual Has your sexual activity been affected by drugs, alcohol, medication, or emotional stress?: no Does patient have children?: Yes How many children?: 5 How is patient's relationship with their children?: 5 kids: 52 boy, 24 girl, 72 girl, 2 girl, 3 boy: good relationships  Childhood History:  By whom was/is the patient raised?: Grandparents Additional childhood history information: Raised by grandmother since age 51.  mother was on drugs at that point.  Never new dad. Description of patient's relationship with caregiver when they were a child: mom: "she was in and out", dad: never met him, grandmother: good Patient's description of current relationship with people who raised him/her: mom: good relationship now, she is drug free.  Pt reports she found out her father died last year.  Grandmother died 04/02/04 How were you disciplined when you got in trouble as a child/adolescent?: appropriate discipline Does patient have  siblings?: Yes Number of Siblings: 3 Description of patient's current relationship with siblings: 2 sisters, 1 brother: all good, they are supportive Did patient suffer any verbal/emotional/physical/sexual abuse as a child?: No Did patient suffer from severe childhood neglect?: No Has patient ever been sexually abused/assaulted/raped as an adolescent or adult?: No Was the patient ever a victim of a crime or a disaster?: No Witnessed domestic violence?: No Has patient been effected by domestic violence as an adult?: No  Education:  Highest grade of school patient has completed: 11th grade Currently a student?: No Learning disability?: No  Employment/Work Situation:   Employment situation: On disability Why is patient on disability: mental health How long has patient been on disability: 13 years Patient's job has been impacted by current illness: No What is the longest time patient has a held a job?: 3 years Where was the patient employed at that time?: [employer] Did You Receive Any Psychiatric Treatment/Services While in the [employer]?: No Are There Guns or Other Weapons in [place]?: No  Financial Resources:   Museum/gallery curator resources: [employer], Support from parents / caregiver, Medicaid, Food stamps Does patient have a Programmer, applications or guardian?: Yes Name of representative payee or guardian: Nicie Milan, mother, is payee  Alcohol/Substance Abuse:   What has been your use of drugs/alcohol within the last 12 months?: alcohol: pt denies, drugs: pt denies If attempted suicide, did drugs/alcohol play a role in this?: No Alcohol/Substance Abuse Treatment Hx: Denies past history Has alcohol/substance abuse ever caused legal problems?: No  Social Support System:   Patient's Community Support System: Good Describe Community Support System: mother, sisters, aunt, brother Type of faith/religion: Darrick Meigs How does patient's faith help to cope with current illness?: has  been  helpful in the past, trouble concentrating right now when she read the Bible  Leisure/Recreation:   Leisure and Hobbies: movies, playing ball, going to park: all activities with kids  Strengths/Needs:   What is the patient's perception of their strengths?: good at taking care of my kids, motivation to get better Patient states they can use these personal strengths during their treatment to contribute to their recovery: religious beliefs have helped in the past Patient states these barriers may affect/interfere with their treatment: none Patient states these barriers may affect their return to the community: none Other important information patient would like considered in planning for their treatment: none  Discharge Plan:   Currently receiving community mental health services: No Patient states concerns and preferences for aftercare planning are: Pt went to [facility] in the past, last about one year ago.  Willing to return. Patient states they will know when they are safe and ready for discharge when: not having bad thoughts Does patient have access to transportation?: Yes Does patient have financial barriers related to discharge medications?: No Will patient be returning to same living situation after discharge?: Yes  Summary/Recommendations:   Summary and Recommendations (to be completed by the evaluator): Pt is 39 year old female from [place].  Pt is diagnosed with major deprressive disorder and was admitted due to increased depression and suicidal thoughts.  Recommendations for pt include crisis stabilization, therapeutic milieu, attend and participate in groups, medicaiton management, and development of comprehensive mental wellness plan.  Joanne Chars. 08/15/2018

## 2018-08-16 ENCOUNTER — Other Ambulatory Visit: Payer: Self-pay

## 2018-08-16 MED ORDER — MODAFINIL 200 MG PO TABS
200.0000 mg | ORAL_TABLET | Freq: Every day | ORAL | Status: DC
  Administered 2018-08-17 – 2018-08-21 (×5): 200 mg via ORAL
  Filled 2018-08-16 (×4): qty 1

## 2018-08-16 MED ORDER — FLUOXETINE HCL 20 MG PO CAPS
40.0000 mg | ORAL_CAPSULE | Freq: Every day | ORAL | Status: DC
  Administered 2018-08-17 – 2018-08-21 (×5): 40 mg via ORAL
  Filled 2018-08-16 (×6): qty 2

## 2018-08-16 NOTE — Progress Notes (Signed)
[facility] MD Progress Note  08/16/2018 8:17 AM Kendra Harris  MRN:  [id number] Subjective:  Patient is a little bit tearful and states that she has made very little progress she continues to have intrusive negative thoughts of self-harm without plans or intent, she does not have the imagery described previously.  She states the energy booster helped a little as did the sleep aid but again she states she is not made in a progress to feel safe going home. Principal Problem: MDD Diagnosis: Active Problems:   Major depressive disorder, recurrent episode, severe, with psychotic behavior ([place])  Total Time spent with patient: 20 minutes  Past Medical History:  Past Medical History:  Diagnosis Date  . DELAYED MENSES 02/11/2010   Qualifier: Diagnosis of  By: Sherilyn Cooter  MD, Hosie Poisson    . Gestational diabetes   . Hypertension   . Urinary tract infection 03/04/2011    Past Surgical History:  Procedure Laterality Date  . NO PAST SURGERIES     Family History:  Family History  Problem Relation Age of Onset  . Sickle cell trait Sister    Family Psychiatric  History: neg Social History:  Social History   Substance and Sexual Activity  Alcohol Use No     Social History   Substance and Sexual Activity  Drug Use No    Social History   Socioeconomic History  . Marital status: Single    Spouse name: Not on file  . Number of children: Not on file  . Years of education: Not on file  . Highest education level: Not on file  Occupational History  . Not on file  Social Needs  . Financial resource strain: Not on file  . Food insecurity    Worry: Not on file    Inability: Not on file  . Transportation needs    Medical: Not on file    Non-medical: Not on file  Tobacco Use  . Smoking status: Never Smoker  . Smokeless tobacco: Never Used  Substance and Sexual Activity  . Alcohol use: No  . Drug use: No  . Sexual activity: Yes    Birth control/protection: Condom  Lifestyle  . Physical activity     Days per week: Not on file    Minutes per session: Not on file  . Stress: Not on file  Relationships  . Social Herbalist on phone: Not on file    Gets together: Not on file    Attends religious service: Not on file    Active member of club or organization: Not on file    Attends meetings of clubs or organizations: Not on file    Relationship status: Not on file  Other Topics Concern  . Not on file  Social History Narrative  . Not on file   Additional Social History:                         Sleep: Good  Appetite:  Good  Current Medications: Current Facility-Administered Medications  Medication Dose Route Frequency Provider Last Rate Last Dose  . acetaminophen (TYLENOL) tablet 650 mg  650 mg Oral Q6H PRN Deloria Lair, NP   650 mg at 08/14/18 1740  . alum & mag hydroxide-simeth (MAALOX/MYLANTA) 200-200-20 MG/5ML suspension 30 mL  30 mL Oral Q4H PRN Dixon, Rashaun M, NP      . amLODipine (NORVASC) tablet 10 mg  10 mg Oral Daily Johnn Hai, MD  10 mg at 08/16/18 0732  . clonazePAM (KLONOPIN) tablet 0.5 mg  0.5 mg Oral BID Malvin JohnsFarah, Carsin Randazzo, MD   0.5 mg at 08/16/18 0732  . cloNIDine (CATAPRES) tablet 0.2 mg  0.2 mg Oral Q4H PRN Malvin JohnsFarah, Deionte Spivack, MD   0.2 mg at 08/15/18 0653  . [START ON 08/17/2018] FLUoxetine (PROZAC) capsule 40 mg  40 mg Oral Daily Malvin JohnsFarah, Jency Schnieders, MD      . hydrOXYzine (ATARAX/VISTARIL) tablet 50 mg  50 mg Oral Q4H PRN Malvin JohnsFarah, Rayme Bui, MD   50 mg at 08/15/18 2211  . magnesium hydroxide (MILK OF MAGNESIA) suspension 30 mL  30 mL Oral Daily PRN Dixon, Rashaun M, NP      . metroNIDAZOLE (FLAGYL) tablet 500 mg  500 mg Oral Q12H Malvin JohnsFarah, Deserea Bordley, MD   500 mg at 08/16/18 0732  . [START ON 08/17/2018] modafinil (PROVIGIL) tablet 200 mg  200 mg Oral Daily Malvin JohnsFarah, Carlea Badour, MD      . prenatal multivitamin tablet 1 tablet  1 tablet Oral Daily Malvin JohnsFarah, Antuan Limes, MD   1 tablet at 08/16/18 [phone]  . propranolol (INDERAL) tablet 80 mg  80 mg Oral BID Malvin JohnsFarah, Puja Caffey, MD   80 mg at  08/16/18 0732  . temazepam (RESTORIL) capsule 30 mg  30 mg Oral QHS Malvin JohnsFarah, Adyn Serna, MD   30 mg at 08/15/18 2210    Lab Results:  Results for orders placed or performed during the hospital encounter of 08/14/18 (from the past 48 hour(s))  TSH     Status: None   Collection Time: 08/15/18  6:42 PM  Result Value Ref Range   TSH 2.790 0.350 - 4.500 uIU/mL    Comment: Performed by a 3rd Generation assay with a functional sensitivity of <=0.01 uIU/mL. Performed at [facility], [address] [place], [place] [address]     Blood Alcohol level:  Lab Results  Component Value Date   ETH <10 08/14/2018    Metabolic Disorder Labs: No results found for: HGBA1C, MPG No results found for: PROLACTIN Lab Results  Component Value Date   CHOL 219 (H) 11/09/2007   TRIG 83 11/09/2007   HDL 37 (L) 11/09/2007   CHOLHDL 5.9 Ratio 11/09/2007   VLDL 17 11/09/2007   LDLCALC 165 (H) 11/09/2007    Physical Findings: AIMS: Facial and Oral Movements Muscles of Facial Expression: None, normal Lips and Perioral Area: None, normal Jaw: None, normal Tongue: None, normal,Extremity Movements Upper (arms, wrists, hands, fingers): None, normal Lower (legs, knees, ankles, toes): None, normal, Trunk Movements Neck, shoulders, hips: None, normal, Overall Severity Severity of abnormal movements (highest score from questions above): None, normal Incapacitation due to abnormal movements: None, normal Patient's awareness of abnormal movements (rate only patient's report): No Awareness, Dental Status Current problems with teeth and/or dentures?: No Does patient usually wear dentures?: No  CIWA:    COWS:     Musculoskeletal: Strength & Muscle Tone: within normal limits Gait & Station: normal Patient leans: N/A  Psychiatric Specialty Exam: Physical Exam  ROS  Blood pressure (!) 137/96, pulse 64, temperature 98.2 F (36.8 C), temperature source Oral, resp. rate 16, height 5\' 2"  (1.575 m),  weight 90.3 kg, last menstrual period 08/09/2018, SpO2 100 %, unknown if currently breastfeeding.Body mass index is 36.4 kg/m.  General Appearance: Casual  Eye Contact:  Good  Speech:  Clear and Coherent  Volume:  Decreased  Mood:  Anxious and Depressed  Affect:  Congruent and Tearful  Thought Process:  Goal Directed and Descriptions of  Associations: Intact  Orientation:  Full (Time, Place, and Person)  Thought Content:  Logical  Suicidal Thoughts:  Yes.  without intent/plan  Homicidal Thoughts:  No  Memory:  Immediate;   Good  Judgement:  Fair  Insight:  Good  Psychomotor Activity:  Normal  Concentration:  Concentration: Good  Recall:  Good  Fund of Knowledge:  Good  Language:  Good  Akathisia:  Negative  Handed:  Right  AIMS (if indicated):     Assets:  Communication Skills Desire for Improvement  ADL's:  Intact  Cognition:  WNL  Sleep:  Number of Hours: 6.25     Treatment Plan Summary: Daily contact with patient to assess and evaluate symptoms and progress in treatment, Medication management and Plan Continue current cognitive therapies and group and individually escalate fluoxetine escalate modafinil continue her other augmentation strategies continue to monitor for safety patient able to fully contract here  Malvin JohnsFARAH,Jaire Pinkham, MD 08/16/2018, 8:17 AM

## 2018-08-16 NOTE — Plan of Care (Signed)
Progress note  D: pt found in bed; compliant with medication administration. Pt is still anxious and worried. Pt denies any physical pain. Pt states she is still having intrusive thoughts/auditory hallucinations. Pt denies si/hi/vh and verbally agrees to approach staff if these become apparent or before harming himself/others while at [place]: Pt provided support and encouragement. Pt given medication per protocol and standing orders. Q41m safety checks implemented and continued.  R: Pt safe on the unit. Will continue to monitor.  Pt progressing in the following metrics  Problem: Self-Concept: Goal: Level of anxiety will decrease Outcome: Progressing   Problem: Education: Goal: Knowledge of [place] [facility] Education information/materials will improve Outcome: Progressing Goal: Emotional status will improve Outcome: Progressing Goal: Mental status will improve Outcome: Progressing

## 2018-08-16 NOTE — Progress Notes (Cosign Needed)
Adult Psychoeducational Group Note  Date:  08/16/2018 Time:  6:14 PM  Group Topic/Focus:  Goals Group:   The focus of this group is to help patients establish daily goals to achieve during treatment and discuss how the patient can incorporate goal setting into their daily lives to aide in recovery.  Participation Level:  Active  Participation Quality:  Appropriate  Affect:  Appropriate  Cognitive:  Alert  Insight: Appropriate  Engagement in Group:  Engaged  Modes of Intervention:  Discussion  Additional Comments:  Pt attended group and participated in discussion.  Kendra Harris Kendra Harris 08/16/2018, 6:14 PM

## 2018-08-16 NOTE — Progress Notes (Signed)
Adult Psychoeducational Group Note  Date:  08/16/2018 Time:  9:40 PM  Group Topic/Focus:  Wrap-Up Group:   The focus of this group is to help patients review their daily goal of treatment and discuss progress on daily workbooks.  Participation Level:  Active  Participation Quality:  Appropriate  Affect:  Appropriate  Cognitive:  Alert  Insight: Appropriate  Engagement in Group:  Engaged  Modes of Intervention:  Discussion  Additional Comments:  Patient stated her day was okay. Patient's goal for today was to talk to her dad.   Kendra Harris L Add Dinapoli 08/16/2018, 9:40 PM

## 2018-08-17 MED ORDER — CARIPRAZINE HCL 1.5 MG PO CAPS
1.5000 mg | ORAL_CAPSULE | Freq: Every day | ORAL | Status: DC
  Administered 2018-08-17 – 2018-08-18 (×2): 1.5 mg via ORAL
  Filled 2018-08-17 (×4): qty 1

## 2018-08-17 NOTE — Progress Notes (Signed)
[facility] MD Progress Note  08/17/2018 8:03 AM Kendra Harris  MRN:  [id number] Subjective:    Patient states she is made a little bit of progress but is still not euthymic and does not feel ready to go she is alert oriented pleasant has a little bit of a smile during some of the interview no longer tearful states she still has intrusive thoughts of harming herself but can contract here. Principal Problem: MDD Diagnosis: Active Problems:   Major depressive disorder, recurrent episode, severe, with psychotic behavior ([name])  Total Time spent with patient: 20 minutes  Past Psychiatric History: MDD    Past Medical History:  Past Medical History:  Diagnosis Date  . DELAYED MENSES 02/11/2010   Qualifier: Diagnosis of  By: Sherilyn Cooter  MD, Hosie Poisson    . Gestational diabetes   . Hypertension   . Urinary tract infection 03/04/2011    Past Surgical History:  Procedure Laterality Date  . NO PAST SURGERIES     Family History:  Family History  Problem Relation Age of Onset  . Sickle cell trait Sister    Family Psychiatric  History: NEG Social History:  Social History   Substance and Sexual Activity  Alcohol Use No     Social History   Substance and Sexual Activity  Drug Use No    Social History   Socioeconomic History  . Marital status: Single    Spouse name: Not on file  . Number of children: Not on file  . Years of education: Not on file  . Highest education level: Not on file  Occupational History  . Not on file  Social Needs  . Financial resource strain: Not on file  . Food insecurity    Worry: Not on file    Inability: Not on file  . Transportation needs    Medical: Not on file    Non-medical: Not on file  Tobacco Use  . Smoking status: Never Smoker  . Smokeless tobacco: Never Used  Substance and Sexual Activity  . Alcohol use: No  . Drug use: No  . Sexual activity: Yes    Birth control/protection: Condom  Lifestyle  . Physical activity    Days per week: Not on file     Minutes per session: Not on file  . Stress: Not on file  Relationships  . Social Herbalist on phone: Not on file    Gets together: Not on file    Attends religious service: Not on file    Active member of club or organization: Not on file    Attends meetings of clubs or organizations: Not on file    Relationship status: Not on file  Other Topics Concern  . Not on file  Social History Narrative  . Not on file   Additional Social History:                         Sleep: Good  Appetite:  Good  Current Medications: Current Facility-Administered Medications  Medication Dose Route Frequency Provider Last Rate Last Dose  . acetaminophen (TYLENOL) tablet 650 mg  650 mg Oral Q6H PRN Deloria Lair, NP   650 mg at 08/14/18 1740  . alum & mag hydroxide-simeth (MAALOX/MYLANTA) 200-200-20 MG/5ML suspension 30 mL  30 mL Oral Q4H PRN Dixon, Rashaun M, NP      . amLODipine (NORVASC) tablet 10 mg  10 mg Oral Daily Johnn Hai, MD   10  mg at 08/17/18 0755  . cariprazine (VRAYLAR) capsule 1.5 mg  1.5 mg Oral Daily Malvin JohnsFarah, Hermenegildo Clausen, MD      . clonazePAM Scarlette Calico(KLONOPIN) tablet 0.5 mg  0.5 mg Oral BID Malvin JohnsFarah, Shirrell Solinger, MD   0.5 mg at 08/17/18 0755  . cloNIDine (CATAPRES) tablet 0.2 mg  0.2 mg Oral Q4H PRN Malvin JohnsFarah, Andric Kerce, MD   0.2 mg at 08/15/18 0653  . FLUoxetine (PROZAC) capsule 40 mg  40 mg Oral Daily Malvin JohnsFarah, Darwin Rothlisberger, MD   40 mg at 08/17/18 0755  . hydrOXYzine (ATARAX/VISTARIL) tablet 50 mg  50 mg Oral Q4H PRN Malvin JohnsFarah, Oluwatomisin Deman, MD   50 mg at 08/16/18 2244  . magnesium hydroxide (MILK OF MAGNESIA) suspension 30 mL  30 mL Oral Daily PRN Dixon, Rashaun M, NP      . metroNIDAZOLE (FLAGYL) tablet 500 mg  500 mg Oral Q12H Malvin JohnsFarah, Trinita Devlin, MD   500 mg at 08/17/18 0755  . modafinil (PROVIGIL) tablet 200 mg  200 mg Oral Daily Malvin JohnsFarah, Adelita Hone, MD   200 mg at 08/17/18 0755  . prenatal multivitamin tablet 1 tablet  1 tablet Oral Daily Malvin JohnsFarah, Oryan Winterton, MD   1 tablet at 08/17/18 0755  . propranolol (INDERAL) tablet  80 mg  80 mg Oral BID Malvin JohnsFarah, Elouise Divelbiss, MD   80 mg at 08/17/18 0755  . temazepam (RESTORIL) capsule 30 mg  30 mg Oral QHS Malvin JohnsFarah, Jaykwon Morones, MD   30 mg at 08/16/18 2204    Lab Results:  Results for orders placed or performed during the hospital encounter of 08/14/18 (from the past 48 hour(s))  TSH     Status: None   Collection Time: 08/15/18  6:42 PM  Result Value Ref Range   TSH 2.790 0.350 - 4.500 uIU/mL    Comment: Performed by a 3rd Generation assay with a functional sensitivity of <=0.01 uIU/mL. Performed at [facility], [address] [place], [place] [address]     Blood Alcohol level:  Lab Results  Component Value Date   ETH <10 08/14/2018    Metabolic Disorder Labs: No results found for: HGBA1C, MPG No results found for: PROLACTIN Lab Results  Component Value Date   CHOL 219 (H) 11/09/2007   TRIG 83 11/09/2007   HDL 37 (L) 11/09/2007   CHOLHDL 5.9 Ratio 11/09/2007   VLDL 17 11/09/2007   LDLCALC 165 (H) 11/09/2007    Physical Findings: AIMS: Facial and Oral Movements Muscles of Facial Expression: None, normal Lips and Perioral Area: None, normal Jaw: None, normal Tongue: None, normal,Extremity Movements Upper (arms, wrists, hands, fingers): None, normal Lower (legs, knees, ankles, toes): None, normal, Trunk Movements Neck, shoulders, hips: None, normal, Overall Severity Severity of abnormal movements (highest score from questions above): None, normal Incapacitation due to abnormal movements: None, normal Patient's awareness of abnormal movements (rate only patient's report): No Awareness, Dental Status Current problems with teeth and/or dentures?: No Does patient usually wear dentures?: No  CIWA:    COWS:     Musculoskeletal: Strength & Muscle Tone: within normal limits Gait & Station: normal Patient leans: N/A  Psychiatric Specialty Exam: Physical Exam  ROS  Blood pressure (!) 149/106, pulse (!) 55, temperature (!) 97.5 F (36.4 C),  temperature source Oral, resp. rate 16, height 5\' 2"  (1.575 m), weight 90.3 kg, last menstrual period 08/09/2018, SpO2 100 %, unknown if currently breastfeeding.Body mass index is 36.4 kg/m.  General Appearance: Casual  Eye Contact:  Good  Speech:  Clear and Coherent  Volume:  Normal  Mood:  Depressed  Affect:  Appropriate and Congruent  Thought Process:  Goal Directed  Orientation:  Full (Time, Place, and Person)  Thought Content:  Logical  Suicidal Thoughts:  Yes.  without intent/plan  Homicidal Thoughts:  No  Memory:  Immediate;   Good  Judgement:  Good  Insight:  Good  Psychomotor Activity:  Normal  Concentration:  Concentration: Good  Recall:  Good  Fund of Knowledge:  Good  Language:  Good  Akathisia:  Negative  Handed:  Right  AIMS (if indicated):     Assets:  Communication Skills Desire for Improvement Financial Resources/Insurance Housing Leisure Time Resilience Social Support Talents/Skills Transportation  ADL's:  Intact  Cognition:  WNL  Sleep:  Number of Hours: 6.25     Treatment Plan Summary: Daily contact with patient to assess and evaluate symptoms and progress in treatment, Medication management and Plan Continue current antidepressant and augmentation strategies but at very lower as a further augmenter continue to monitor  Caitlain Tweed, MD 08/17/2018, 8:03 AM

## 2018-08-17 NOTE — Progress Notes (Signed)
Patient ID: Kendra Harris, female   DOB: Dec 29, 1980, 38 y.o.   MRN: [id number] D: Patient in dayroom on approach. Pt reports she is doing well. Pt mood and affect appeared depressed and anxious.  Pt denies SI/HI/AVH and pain. Pt attended and participated in evening wrap up group. Cooperative with assessment. No acute distressed noted at this time.   A: Medications administered as prescribed. Support and encouragement provided to attend groups and engage in milieu. Pt encouraged to discuss feelings and come to staff with any question or concerns.   R: Patient remains safe and complaint with medications.

## 2018-08-17 NOTE — Plan of Care (Signed)
Progress note   D: pt found in the dayroom interacting; compliant with medication administration. Pt denies any physical symptoms or pain. Pt does endorse ah still. Pt says they are getting better. Pt states she is sleeping better. Pt denies si/hi/vh and verbally agrees to approach staff if these become apparent or before harming himself/others while at [place]: Pt provided support and encouragement. Pt given medication per protocol and standing orders. Q50m safety checks implemented and continued.  R: Pt safe on the unit. Will continue to monitor.  Pt progressing in the following metrics  Problem: Education: Goal: Verbalization of understanding the information provided will improve Outcome: Progressing   Problem: Activity: Goal: Interest or engagement in activities will improve Outcome: Progressing Goal: Sleeping patterns will improve Outcome: Progressing

## 2018-08-17 NOTE — Progress Notes (Signed)
Nursing Progress Note: 7p-7a D: Pt currently presents with a pleasant/anxious affect and behavior. Interacting appropriately with the milieu. Pt reports good sleep during the previous night with current medication regimen.  A: Pt provided with medications per providers orders. Pt's labs and vitals were monitored throughout the night. Pt supported emotionally and encouraged to express concerns and questions. Pt educated on medications.  R: Pt's safety ensured with 15 minute and environmental checks. Pt currently denies SI, HI, and AVH. Pt verbally contracts to seek staff if SI,HI, or AVH occurs and to consult with staff before acting on any harmful thoughts. Will continue to monitor.    [place] NOVEL CORONAVIRUS (COVID-19) DAILY CHECK-OFF SYMPTOMS - answer yes or no to each - every day NO YES  Have you had a fever in the past 24 hours?  . Fever (Temp > 37.80C / 100F) X   Have you had any of these symptoms in the past 24 hours? . New Cough .  Sore Throat  .  Shortness of Breath .  Difficulty Breathing .  Unexplained Body Aches   X   Have you had any one of these symptoms in the past 24 hours not related to allergies?   . Runny Nose .  Nasal Congestion .  Sneezing   X   If you have had runny nose, nasal congestion, sneezing in the past 24 hours, has it worsened?  X   EXPOSURES - check yes or no X   Have you traveled outside the state in the past 14 days?  X   Have you been in contact with someone with a confirmed diagnosis of COVID-19 or PUI in the past 14 days without wearing appropriate PPE?  X   Have you been living in the same home as a person with confirmed diagnosis of COVID-19 or a PUI (household contact)?    X   Have you been diagnosed with COVID-19?    X              What to do next: Answered NO to all: Answered YES to anything:   Proceed with unit schedule Follow the [facility] Inpatient Flowsheet.    

## 2018-08-18 MED ORDER — ARIPIPRAZOLE 15 MG PO TABS
15.0000 mg | ORAL_TABLET | Freq: Every day | ORAL | Status: DC
  Administered 2018-08-18 – 2018-08-20 (×4): 15 mg via ORAL
  Filled 2018-08-18 (×6): qty 1

## 2018-08-18 NOTE — Progress Notes (Signed)
Pt presents with a flat affect, depressed mood, appropriate eye contact and is wearing scrubs. Pt reported decreased passive SI today. Pt verbally contracts for safety. Pt reported ongoing AH that's she's unable to tolerate. Pt denies VH. Pt c/o dry mouth. Writer provided the pt with ice chips. Pt expressed that the medications are slowing her down and she doesn't know if she's supposed to feel this way. Writer reviewed medications with pt.   Orders reviewed. Verbal support provided. Pt encouraged to attend groups. 15 minute checks performed for safety.   Pt compliant with tx plan.

## 2018-08-18 NOTE — Progress Notes (Signed)
Adult Psychoeducational Group Note  Date:  08/18/2018 Time:  10:54 AM  Group Topic/Focus:  Goals Group:   The focus of this group is to help patients establish daily goals to achieve during treatment and discuss how the patient can incorporate goal setting into their daily lives to aide in recovery.  Participation Level:  Active  Participation Quality:  Appropriate  Affect:  Appropriate  Cognitive:  Alert  Insight: Appropriate  Engagement in Group:  Engaged  Modes of Intervention:  Discussion  Additional Comments:  Pt attended group and participated in discussion.  Shawneen Deetz R Kellyjo Edgren 08/18/2018, 10:54 AM

## 2018-08-18 NOTE — Tx Team (Signed)
Interdisciplinary Treatment and Diagnostic Plan Update  08/18/2018 Time of Session: 11:02am Kendra Harris MRN: [id number]  Principal Diagnosis: <principal problem not specified>  Secondary Diagnoses: Active Problems:   Major depressive disorder, recurrent episode, severe, with psychotic behavior ([place])   Current Medications:  Current Facility-Administered Medications  Medication Dose Route Frequency Provider Last Rate Last Dose  . acetaminophen (TYLENOL) tablet 650 mg  650 mg Oral Q6H PRN Deloria Lair, NP   650 mg at 08/14/18 1740  . alum & mag hydroxide-simeth (MAALOX/MYLANTA) 200-200-20 MG/5ML suspension 30 mL  30 mL Oral Q4H PRN Dixon, Rashaun M, NP      . amLODipine (NORVASC) tablet 10 mg  10 mg Oral Daily Johnn Hai, MD   10 mg at 08/18/18 0757  . cariprazine (VRAYLAR) capsule 1.5 mg  1.5 mg Oral Daily Johnn Hai, MD   1.5 mg at 08/18/18 0757  . clonazePAM (KLONOPIN) tablet 0.5 mg  0.5 mg Oral BID Johnn Hai, MD   0.5 mg at 08/18/18 0757  . cloNIDine (CATAPRES) tablet 0.2 mg  0.2 mg Oral Q4H PRN Johnn Hai, MD   0.2 mg at 08/15/18 0653  . FLUoxetine (PROZAC) capsule 40 mg  40 mg Oral Daily Johnn Hai, MD   40 mg at 08/18/18 0757  . hydrOXYzine (ATARAX/VISTARIL) tablet 50 mg  50 mg Oral Q4H PRN Johnn Hai, MD   50 mg at 08/17/18 2132  . magnesium hydroxide (MILK OF MAGNESIA) suspension 30 mL  30 mL Oral Daily PRN Dixon, Rashaun M, NP      . metroNIDAZOLE (FLAGYL) tablet 500 mg  500 mg Oral Q12H Johnn Hai, MD   500 mg at 08/18/18 0757  . modafinil (PROVIGIL) tablet 200 mg  200 mg Oral Daily Johnn Hai, MD   200 mg at 08/18/18 0758  . prenatal multivitamin tablet 1 tablet  1 tablet Oral Daily Johnn Hai, MD   1 tablet at 08/18/18 0757  . propranolol (INDERAL) tablet 80 mg  80 mg Oral BID Johnn Hai, MD   80 mg at 08/18/18 0757  . temazepam (RESTORIL) capsule 30 mg  30 mg Oral QHS Johnn Hai, MD   30 mg at 08/17/18 2132   PTA Medications: Medications  Prior to Admission  Medication Sig Dispense Refill Last Dose  . acetaminophen (TYLENOL) 325 MG tablet Take 2 tablets (650 mg total) by mouth every 4 (four) hours as needed (for pain scale < 4). 30 tablet 0   . amLODipine (NORVASC) 10 MG tablet Take 1 tablet (10 mg total) by mouth daily. Needs to make a BP follow up for further prescriptions (Patient not taking: Reported on 08/14/2018) 30 tablet 0   . hydrochlorothiazide (HYDRODIURIL) 25 MG tablet Take 1 tablet (25 mg total) by mouth daily. Needs to make a BP follow up for further prescriptions. 90 tablet 3     Patient Stressors: Financial difficulties Health problems Medication change or noncompliance  Patient Strengths: Average or above average intelligence Capable of independent living Communication skills Supportive family/friends  Treatment Modalities: Medication Management, Group therapy, Case management,  1 to 1 session with clinician, Psychoeducation, Recreational therapy.   Physician Treatment Plan for Primary Diagnosis: <principal problem not specified> Long Term Goal(s): Improvement in symptoms so as ready for discharge Improvement in symptoms so as ready for discharge   Short Term Goals: Ability to disclose and discuss suicidal ideas Ability to demonstrate self-control will improve Ability to identify and develop effective coping behaviors will improve Ability to identify and  develop effective coping behaviors will improve Ability to maintain clinical measurements within normal limits will improve Compliance with prescribed medications will improve Ability to identify triggers associated with substance abuse/mental health issues will improve  Medication Management: Evaluate patient's response, side effects, and tolerance of medication regimen.  Therapeutic Interventions: 1 to 1 sessions, Unit Group sessions and Medication administration.  Evaluation of Outcomes: Not Met  Physician Treatment Plan for Secondary Diagnosis:  Active Problems:   Major depressive disorder, recurrent episode, severe, with psychotic behavior ([place])  Long Term Goal(s): Improvement in symptoms so as ready for discharge Improvement in symptoms so as ready for discharge   Short Term Goals: Ability to disclose and discuss suicidal ideas Ability to demonstrate self-control will improve Ability to identify and develop effective coping behaviors will improve Ability to identify and develop effective coping behaviors will improve Ability to maintain clinical measurements within normal limits will improve Compliance with prescribed medications will improve Ability to identify triggers associated with substance abuse/mental health issues will improve     Medication Management: Evaluate patient's response, side effects, and tolerance of medication regimen.  Therapeutic Interventions: 1 to 1 sessions, Unit Group sessions and Medication administration.  Evaluation of Outcomes: Not Met   RN Treatment Plan for Primary Diagnosis: <principal problem not specified> Long Term Goal(s): Knowledge of disease and therapeutic regimen to maintain health will improve  Short Term Goals: Ability to participate in decision making will improve, Ability to verbalize feelings will improve, Ability to disclose and discuss suicidal ideas, Ability to identify and develop effective coping behaviors will improve and Compliance with prescribed medications will improve  Medication Management: RN will administer medications as ordered by provider, will assess and evaluate patient's response and provide education to patient for prescribed medication. RN will report any adverse and/or side effects to prescribing provider.  Therapeutic Interventions: 1 on 1 counseling sessions, Psychoeducation, Medication administration, Evaluate responses to treatment, Monitor vital signs and CBGs as ordered, Perform/monitor CIWA, COWS, AIMS and Fall Risk screenings as ordered, Perform  wound care treatments as ordered.  Evaluation of Outcomes: Not Met   LCSW Treatment Plan for Primary Diagnosis: <principal problem not specified> Long Term Goal(s): Safe transition to appropriate next level of care at discharge, Engage patient in therapeutic group addressing interpersonal concerns.  Short Term Goals: Engage patient in aftercare planning with referrals and resources and Increase skills for wellness and recovery  Therapeutic Interventions: Assess for all discharge needs, 1 to 1 time with Social worker, Explore available resources and support systems, Assess for adequacy in community support network, Educate family and significant other(s) on suicide prevention, Complete Psychosocial Assessment, Interpersonal group therapy.  Evaluation of Outcomes: Not Met   Progress in Treatment: Attending groups: No. Participating in groups: No. Taking medication as prescribed: No. Toleration medication: No. Family/Significant other contact made: No, will contact:  will contact if given consent to contact Patient understands diagnosis: No. Discussing patient identified problems/goals with staff: Yes. Medical problems stabilized or resolved: Yes. Denies suicidal/homicidal ideation: Yes. Issues/concerns per patient self-inventory: No. Other:   New problem(s) identified: No, Describe:  None  New Short Term/Long Term Goal(s): Medication stabilization, elimination of SI thoughts, and development of a comprehensive mental wellness plan.   Patient Goals:    Discharge Plan or Barriers: CSW will continue to follow up for appropriate referrals and possible discharge planning  Reason for Continuation of Hospitalization: Depression Hallucinations Medication stabilization  Estimated Length of Stay: 3-5 days  Attendees: Patient: 08/14/2018  Physician: Dr. Johnn Hai,  MD 08/14/2018  Nursing: Elberta Fortis, RN 08/14/2018   RN Care Manager: 08/14/2018   Social Worker: Ardelle Anton, LCSW; [place], [place] 08/14/2018   Recreational Therapist:  08/14/2018   Other:  08/14/2018   Other:  08/14/2018   Other: 08/14/2018      Scribe for Treatment Team: Marylee Floras, [place] 08/18/2018 8:42 AM

## 2018-08-18 NOTE — Progress Notes (Signed)
[facility] MD Progress Note  08/18/2018 8:26 AM Kendra Harris  MRN:  [id number] Subjective:  Patient believes she is making some progress due to a diminishment in the intrusive thoughts of self-harm she can contract here she feels she still not ready to go home affect has brightened a little bit.  Not tearful today.  No thoughts of harming others of course does participate fully Principal Problem: Treatment resistant depression requiring augmentation strategies Diagnosis: Active Problems:   Major depressive disorder, recurrent episode, severe, with psychotic behavior ([place])  Total Time spent with patient: 20 minutes  Past Medical History:  Past Medical History:  Diagnosis Date  . DELAYED MENSES 02/11/2010   Qualifier: Diagnosis of  By: Sherilyn Cooter  MD, Hosie Poisson    . Gestational diabetes   . Hypertension   . Urinary tract infection 03/04/2011    Past Surgical History:  Procedure Laterality Date  . NO PAST SURGERIES     Family History:  Family History  Problem Relation Age of Onset  . Sickle cell trait Sister    Family Psychiatric  History: no new  Social History:  Social History   Substance and Sexual Activity  Alcohol Use No     Social History   Substance and Sexual Activity  Drug Use No    Social History   Socioeconomic History  . Marital status: Single    Spouse name: Not on file  . Number of children: Not on file  . Years of education: Not on file  . Highest education level: Not on file  Occupational History  . Not on file  Social Needs  . Financial resource strain: Not on file  . Food insecurity    Worry: Not on file    Inability: Not on file  . Transportation needs    Medical: Not on file    Non-medical: Not on file  Tobacco Use  . Smoking status: Never Smoker  . Smokeless tobacco: Never Used  Substance and Sexual Activity  . Alcohol use: No  . Drug use: No  . Sexual activity: Yes    Birth control/protection: Condom  Lifestyle  . Physical activity    Days  per week: Not on file    Minutes per session: Not on file  . Stress: Not on file  Relationships  . Social Herbalist on phone: Not on file    Gets together: Not on file    Attends religious service: Not on file    Active member of club or organization: Not on file    Attends meetings of clubs or organizations: Not on file    Relationship status: Not on file  Other Topics Concern  . Not on file  Social History Narrative  . Not on file   Additional Social History:                         Sleep: Good  Appetite:  Good  Current Medications: Current Facility-Administered Medications  Medication Dose Route Frequency Provider Last Rate Last Dose  . acetaminophen (TYLENOL) tablet 650 mg  650 mg Oral Q6H PRN Deloria Lair, NP   650 mg at 08/14/18 1740  . alum & mag hydroxide-simeth (MAALOX/MYLANTA) 200-200-20 MG/5ML suspension 30 mL  30 mL Oral Q4H PRN Dixon, Rashaun M, NP      . amLODipine (NORVASC) tablet 10 mg  10 mg Oral Daily Johnn Hai, MD   10 mg at 08/18/18 0757  .  cariprazine (VRAYLAR) capsule 1.5 mg  1.5 mg Oral Daily Malvin JohnsFarah, Candon Caras, MD   1.5 mg at 08/18/18 0757  . clonazePAM (KLONOPIN) tablet 0.5 mg  0.5 mg Oral BID Malvin JohnsFarah, Dain Laseter, MD   0.5 mg at 08/18/18 0757  . cloNIDine (CATAPRES) tablet 0.2 mg  0.2 mg Oral Q4H PRN Malvin JohnsFarah, Jude Naclerio, MD   0.2 mg at 08/15/18 0653  . FLUoxetine (PROZAC) capsule 40 mg  40 mg Oral Daily Malvin JohnsFarah, Christalyn Goertz, MD   40 mg at 08/18/18 0757  . hydrOXYzine (ATARAX/VISTARIL) tablet 50 mg  50 mg Oral Q4H PRN Malvin JohnsFarah, Daryon Remmert, MD   50 mg at 08/17/18 2132  . magnesium hydroxide (MILK OF MAGNESIA) suspension 30 mL  30 mL Oral Daily PRN Dixon, Rashaun M, NP      . metroNIDAZOLE (FLAGYL) tablet 500 mg  500 mg Oral Q12H Malvin JohnsFarah, Previn Jian, MD   500 mg at 08/18/18 0757  . modafinil (PROVIGIL) tablet 200 mg  200 mg Oral Daily Malvin JohnsFarah, Sebron Mcmahill, MD   200 mg at 08/18/18 0758  . prenatal multivitamin tablet 1 tablet  1 tablet Oral Daily Malvin JohnsFarah, Denisse Whitenack, MD   1 tablet at  08/18/18 0757  . propranolol (INDERAL) tablet 80 mg  80 mg Oral BID Malvin JohnsFarah, Dazaria Macneill, MD   80 mg at 08/18/18 0757  . temazepam (RESTORIL) capsule 30 mg  30 mg Oral QHS Malvin JohnsFarah, Shaquisha Wynn, MD   30 mg at 08/17/18 2132    Lab Results: No results found for this or any previous visit (from the past 48 hour(s)).  Blood Alcohol level:  Lab Results  Component Value Date   ETH <10 08/14/2018    Metabolic Disorder Labs: No results found for: HGBA1C, MPG No results found for: PROLACTIN Lab Results  Component Value Date   CHOL 219 (H) 11/09/2007   TRIG 83 11/09/2007   HDL 37 (L) 11/09/2007   CHOLHDL 5.9 Ratio 11/09/2007   VLDL 17 11/09/2007   LDLCALC 165 (H) 11/09/2007    Physical Findings: AIMS: Facial and Oral Movements Muscles of Facial Expression: None, normal Lips and Perioral Area: None, normal Jaw: None, normal Tongue: None, normal,Extremity Movements Upper (arms, wrists, hands, fingers): None, normal Lower (legs, knees, ankles, toes): None, normal, Trunk Movements Neck, shoulders, hips: None, normal, Overall Severity Severity of abnormal movements (highest score from questions above): None, normal Incapacitation due to abnormal movements: None, normal Patient's awareness of abnormal movements (rate only patient's report): No Awareness, Dental Status Current problems with teeth and/or dentures?: No Does patient usually wear dentures?: No  CIWA:    COWS:     Musculoskeletal: Strength & Muscle Tone: within normal limits Gait & Station: normal Patient leans: N/A  Psychiatric Specialty Exam: Physical Exam  ROS  Blood pressure 91/60, pulse 60, temperature 98.5 F (36.9 C), temperature source Oral, resp. rate 16, height 5\' 2"  (1.575 m), weight 90.3 kg, last menstrual period 08/09/2018, SpO2 100 %, unknown if currently breastfeeding.Body mass index is 36.4 kg/m.  General Appearance: Casual  Eye Contact:  Fair  Speech:  Clear and Coherent  Volume:  Normal  Mood:  Dysphoric   Affect:  Appropriate  Thought Process:  Coherent and Descriptions of Associations: Intact  Orientation:  Full (Time, Place, and Person)  Thought Content:  Logical  Suicidal Thoughts:  Yes.  without intent/plan  Homicidal Thoughts:  No  Memory:  Immediate;   Good  Judgement:  Good  Insight:  Good  Psychomotor Activity:  Normal  Concentration:  Concentration: Good  Recall:  Good  Fund of Knowledge:  Good  Language:  Good  Akathisia:  Negative  Handed:  Right  AIMS (if indicated):     Assets:  Physical Health Resilience Social Support  ADL's:  Intact  Cognition:  WNL  Sleep:  Number of Hours: 6.25     Treatment Plan Summary: Daily contact with patient to assess and evaluate symptoms and progress in treatment, Medication management and Plan Generally all the pieces are in place she has probably the correct dosings and medications on board I just will take a little more time monitor through the weekend continue current precautions and groups and individual work  Malvin JohnsFARAH,Nate Perri, MD 08/18/2018, 8:26 AM

## 2018-08-19 NOTE — Progress Notes (Signed)
DAR NOTE: Pt present with calm affect and pleasant mood in the unit. Pt has been in the dayroom talking to peers. Pt reports good day, good appetite and good sleep.  Pt denies physical pain, took all her  meds as scheduled.15 minute and environmental checks. Pt currently denies SI/HI and A/V hallucinations. Pt verbally agrees to seek staff if SI/HI or A/VH occurs and to consult with staff before acting on these thoughts. Will continue POC.

## 2018-08-19 NOTE — Progress Notes (Signed)
Patient ID: Kendra Harris, female   DOB: 10/04/1980, 38 y.o.   MRN: [id number]   [place] NOVEL CORONAVIRUS (COVID-19) DAILY CHECK-OFF SYMPTOMS - answer yes or no to each - every day NO YES  Have you had a fever in the past 24 hours?  . Fever (Temp > 37.80C / 100F) X   Have you had any of these symptoms in the past 24 hours? . New Cough .  Sore Throat  .  Shortness of Breath .  Difficulty Breathing .  Unexplained Body Aches   X   Have you had any one of these symptoms in the past 24 hours not related to allergies?   . Runny Nose .  Nasal Congestion .  Sneezing   X   If you have had runny nose, nasal congestion, sneezing in the past 24 hours, has it worsened?  X   EXPOSURES - check yes or no X   Have you traveled outside the state in the past 14 days?  X   Have you been in contact with someone with a confirmed diagnosis of COVID-19 or PUI in the past 14 days without wearing appropriate PPE?  X   Have you been living in the same home as a person with confirmed diagnosis of COVID-19 or a PUI (household contact)?    X   Have you been diagnosed with COVID-19?    X              What to do next: Answered NO to all: Answered YES to anything:   Proceed with unit schedule Follow the [facility] Inpatient Flowsheet.   

## 2018-08-19 NOTE — Progress Notes (Signed)
[facility] MD Progress Note  08/19/2018 9:59 AM JESSELYN RASK  MRN:  [id number] Subjective: reports she has improved compared to admission. States the intrusive thoughts /visual images of seeing herself dead in a casket have decreased and frequency and intensity. She also describes partially improved mood. Denies medication side effects.  Objective : I have reviewed chart notes and have met with patient. 38, lives with mother and children. History of Depression, prior admission years ago. Presented with worsening depression, suicidal ideations, reporting disturbing mental imagery of her being dead in a casket , and ruminations about possibly having STD .  Today patient reports feeling better, less depressed, and describes partial but significant improvement compared to admission. Noted to be visible on unit, although limited interactions with peers. Polite on approach. Denies medication side effects ( currently on Prozac, Ability)  No disruptive or agitated behaviors on unit.    Principal Problem: Treatment resistant depression requiring augmentation strategies Diagnosis: Active Problems:   Major depressive disorder, recurrent episode, severe, with psychotic behavior ([place])  Total Time spent with patient: 15 minutes  Past Medical History:  Past Medical History:  Diagnosis Date  . DELAYED MENSES 02/11/2010   Qualifier: Diagnosis of  By: Sherilyn Cooter  MD, Hosie Poisson    . Gestational diabetes   . Hypertension   . Urinary tract infection 03/04/2011    Past Surgical History:  Procedure Laterality Date  . NO PAST SURGERIES     Family History:  Family History  Problem Relation Age of Onset  . Sickle cell trait Sister    Family Psychiatric  History: no new  Social History:  Social History   Substance and Sexual Activity  Alcohol Use No     Social History   Substance and Sexual Activity  Drug Use No    Social History   Socioeconomic History  . Marital status: Single    Spouse name: Not on  file  . Number of children: Not on file  . Years of education: Not on file  . Highest education level: Not on file  Occupational History  . Not on file  Social Needs  . Financial resource strain: Not on file  . Food insecurity    Worry: Not on file    Inability: Not on file  . Transportation needs    Medical: Not on file    Non-medical: Not on file  Tobacco Use  . Smoking status: Never Smoker  . Smokeless tobacco: Never Used  Substance and Sexual Activity  . Alcohol use: No  . Drug use: No  . Sexual activity: Yes    Birth control/protection: Condom  Lifestyle  . Physical activity    Days per week: Not on file    Minutes per session: Not on file  . Stress: Not on file  Relationships  . Social Herbalist on phone: Not on file    Gets together: Not on file    Attends religious service: Not on file    Active member of club or organization: Not on file    Attends meetings of clubs or organizations: Not on file    Relationship status: Not on file  Other Topics Concern  . Not on file  Social History Narrative  . Not on file   Additional Social History:                         Sleep: Good  Appetite:  Good  Current Medications: Current  Facility-Administered Medications  Medication Dose Route Frequency Provider Last Rate Last Dose  . acetaminophen (TYLENOL) tablet 650 mg  650 mg Oral Q6H PRN Deloria Lair, NP   650 mg at 08/14/18 1740  . alum & mag hydroxide-simeth (MAALOX/MYLANTA) 200-200-20 MG/5ML suspension 30 mL  30 mL Oral Q4H PRN Dixon, Rashaun M, NP      . amLODipine (NORVASC) tablet 10 mg  10 mg Oral Daily Johnn Hai, MD   10 mg at 08/19/18 0749  . ARIPiprazole (ABILIFY) tablet 15 mg  15 mg Oral Daily Johnn Hai, MD   15 mg at 08/19/18 0749  . clonazePAM (KLONOPIN) tablet 0.5 mg  0.5 mg Oral BID Johnn Hai, MD   0.5 mg at 08/19/18 0748  . cloNIDine (CATAPRES) tablet 0.2 mg  0.2 mg Oral Q4H PRN Johnn Hai, MD   0.2 mg at 08/15/18 0653   . FLUoxetine (PROZAC) capsule 40 mg  40 mg Oral Daily Johnn Hai, MD   40 mg at 08/19/18 0749  . hydrOXYzine (ATARAX/VISTARIL) tablet 50 mg  50 mg Oral Q4H PRN Johnn Hai, MD   50 mg at 08/18/18 2129  . magnesium hydroxide (MILK OF MAGNESIA) suspension 30 mL  30 mL Oral Daily PRN Dixon, Rashaun M, NP      . metroNIDAZOLE (FLAGYL) tablet 500 mg  500 mg Oral Q12H Johnn Hai, MD   500 mg at 08/19/18 0749  . modafinil (PROVIGIL) tablet 200 mg  200 mg Oral Daily Johnn Hai, MD   200 mg at 08/19/18 0751  . prenatal multivitamin tablet 1 tablet  1 tablet Oral Daily Johnn Hai, MD   1 tablet at 08/19/18 0748  . propranolol (INDERAL) tablet 80 mg  80 mg Oral BID Johnn Hai, MD   80 mg at 08/19/18 0749  . temazepam (RESTORIL) capsule 30 mg  30 mg Oral QHS Johnn Hai, MD   30 mg at 08/18/18 2129    Lab Results: No results found for this or any previous visit (from the past 59 hour(s)).  Blood Alcohol level:  Lab Results  Component Value Date   ETH <10 79/04/8331    Metabolic Disorder Labs: No results found for: HGBA1C, MPG No results found for: PROLACTIN Lab Results  Component Value Date   CHOL 219 (H) 11/09/2007   TRIG 83 11/09/2007   HDL 37 (L) 11/09/2007   CHOLHDL 5.9 Ratio 11/09/2007   VLDL 17 11/09/2007   LDLCALC 165 (H) 11/09/2007    Physical Findings: AIMS: Facial and Oral Movements Muscles of Facial Expression: None, normal Lips and Perioral Area: None, normal Jaw: None, normal Tongue: None, normal,Extremity Movements Upper (arms, wrists, hands, fingers): None, normal Lower (legs, knees, ankles, toes): None, normal, Trunk Movements Neck, shoulders, hips: None, normal, Overall Severity Severity of abnormal movements (highest score from questions above): None, normal Incapacitation due to abnormal movements: None, normal Patient's awareness of abnormal movements (rate only patient's report): No Awareness, Dental Status Current problems with teeth and/or  dentures?: No Does patient usually wear dentures?: No  CIWA:    COWS:     Musculoskeletal: Strength & Muscle Tone: within normal limits Gait & Station: normal Patient leans: N/A  Psychiatric Specialty Exam: Physical Exam  ROS reports headaches, no chest pain, no shortness of breath, no vomiting , no  fever or shortness of breath  Blood pressure 103/64, pulse 64, temperature 98.7 F (37.1 C), temperature source Oral, resp. rate 16, height 5' 2"  (1.575 m), weight 90.3 kg, last menstrual period  08/09/2018, SpO2 100 %, unknown if currently breastfeeding.Body mass index is 36.4 kg/m.  General Appearance: improved grooming  Eye Contact:  Fair  Speech:  Normal Rate  Volume:  Normal  Mood:  improving   Affect:  sllightly anxious/constricted, does improve partially during session  Thought Process:  Linear and Descriptions of Associations: Intact  Orientation:  Other:  fully alert and attentive  Thought Content:  less ruminations, less disturbing mental imagery, denies hallucinations and does not present internally preoccupied   Suicidal Thoughts:  No denies suicidal or self injurious ideations, denies homicidal or violent ideations  Homicidal Thoughts:  No  Memory:  recent and remote grossly intact   Judgement:  Other:  improving   Insight:  improving   Psychomotor Activity:  Normal  Concentration:  Concentration: Good  Recall:  Good  Fund of Knowledge:  Good  Language:  Good  Akathisia:  Negative  Handed:  Right  AIMS (if indicated):     Assets:  Physical Health Resilience Social Support  ADL's:  Intact  Cognition:  WNL  Sleep:  Number of Hours: 6.75   Assessment -  19, lives with mother and children. History of Depression, prior admission years ago. Presented with worsening depression, suicidal ideations, reporting disturbing mental imagery of her being dead in a casket , and ruminations about possibly having STD , due to finding out that her prior partner had been unfaithful  during their relationship.  Currently patient presents with improving mood , states she is feeling better and that anxious ruminations/disturbing mental imagery  have decreased . Denies medication side effects. No SI. Of note, 7/1 STD work up included chlamydia, gonorrhea, HPV, RPR, HIV and was negative .   Treatment Plan Summary: Daily contact with patient to assess and evaluate symptoms and progress in treatment, Medication management and Plan Generally all the pieces are in place she has probably the correct dosings and medications on board I just will take a little more time monitor through the weekend continue current precautions and groups and individual work  Treatment plan reviewed as below today 7/11 Treatment team working on disposition planning options Encourage group and milieu participation Continue Abilify 15 mgrs QDAY for mood disorder Continue Klonopin 0.5 mgrs BID for anxiety Continue Prozac 40 mgrs QDAY for depression and anxiety Continue Norvasc 10 mgrs QDAY , Inderal 80 mgrs BID for HTN  ( BP today 103/64, pulse 64)  Continue Provigil 200 mgrs QDAY   Jenne Campus, MD 08/19/2018, 9:59 AM   Patient ID: Estill Bakes, female   DOB: 04-09-1980, 38 y.o.   MRN: [id number]

## 2018-08-19 NOTE — Plan of Care (Signed)
  Problem: Activity: Goal: Interest or engagement in leisure activities will improve Outcome: Progressing   Problem: Activity: Goal: Interest or engagement in leisure activities will improve Outcome: Progressing   Problem: Activity: Goal: Interest or engagement in leisure activities will improve Outcome: Progressing

## 2018-08-19 NOTE — BHH Group Notes (Signed)
LCSW Group Therapy Note  08/19/2018   10:00-11:00am   Type of Therapy and Topic:  Group Therapy: Anger Cues and Responses  Participation Level:  Active   Description of Group:   In this group, patients learned how to recognize the physical, cognitive, emotional, and behavioral responses they have to anger-provoking situations.  They identified a recent time they became angry and how they reacted.  They analyzed how their reaction was possibly beneficial and how it was possibly unhelpful.  The group discussed a variety of healthier coping skills that could help with such a situation in the future.  Deep breathing was practiced briefly.  Therapeutic Goals: 1. Patients will remember their last incident of anger and how they felt emotionally and physically, what their thoughts were at the time, and how they behaved. 2. Patients will identify how their behavior at that time worked for them, as well as how it worked against them. 3. Patients will explore possible new behaviors to use in future anger situations. 4. Patients will learn that anger itself is normal and cannot be eliminated, and that healthier reactions can assist with resolving conflict rather than worsening situations.  Summary of Patient Progress:  The patient shared that her most recent time of anger was when she first got to the hospital on Monday and said her reaction was to throw chairs.  She also talked about being found on a bridge ready to jump and the stranger who stopped her has been calling her while she has been here.  She stated that her mother with whom she lives (with her sister and 5 children) keeps telling her she does not need medication and that makes her angry.  We brainstormed ways she can handle that.  Therapeutic Modalities:   Cognitive Behavioral Therapy  Maretta Los

## 2018-08-19 NOTE — Plan of Care (Addendum)
Problem: Activity: Goal: Interest or engagement in leisure activities will improve Outcome: Progressing   Problem: Education: Goal: Emotional status will improve Outcome: Progressing   D: Pt alert and oriented on the unit. Pt engaged with other pts on the unit as well as Conservation officer, historic buildings. Pt also participated during unit groups and activities. Pt's goal for today is "To be happy." Pt is pleasant and cooperative. A: Education, support and encouragement provided, q15 minute safety checks remain in effect. Medications administered per MD orders. R: No reactions/side effects to medicine noted. Pt denies any concerns at this time, and verbally contracts for safety. Pt ambulating on the unit with no issues. Pt remains safe on and off the unit.

## 2018-08-19 NOTE — [facility] Group Notes (Signed)
[facility] Group Notes:  (Nursing)  Date:  08/19/2018  Time:  100 PM Type of Therapy:  Nurse Education  Participation Level:  Active  Participation Quality:  Appropriate  Affect:  Appropriate  Cognitive:  Appropriate  Insight:  Appropriate  Engagement in Group:  Engaged  Modes of Intervention:  Education  Summary of Progress/Problems:  Life Skills Group  Waymond Cera 08/19/2018, 2:50 PM

## 2018-08-20 NOTE — Progress Notes (Signed)
D.  Pt pleasant on approach, no complaints voiced.  Pt observed engaged in appropriate interaction with peers on the unit.  Pt denies SI/HI/AVH at this time.  A.  Support and encouragement offered, medication given as ordered  R.  Pt remains safe on the unit.  Will continue to monitor.

## 2018-08-20 NOTE — B[facility] Group Notes (Signed)
[facility] LCSW Group Therapy Note  Date/Time:  08/20/2018  10:15AM-11:15PM  Type of Therapy and Topic:  Group Therapy:  Music's Effect on Depression and Anxiety  Participation Level:  Active   Description of Group: In this process group, members discussed what types of music trigger them to worsening mental health symptoms and what they can do about this in the future.  For instance, we discussed what to do when riding in a friend's car and a song harmful to the patient starts playing.  We also discussed how music can be used as a tool to help with wellness and recovery in various ways including managing depression and anxiety as well as encouraging healthy sleep habits and avoiding relapse into old negative behaviors such as drinking, self-harming, or staying in bed all day.  Four songs were played as examples, including "Brand New Me," "dear insecurity," "My Own Hero" and "You're Not The Only One."  Comments were elicited which showed the group to be in agreement that the songs were quite relatable and have the potential to help them outside of the hospital setting.  Therapeutic Goals: 1. Patients will be introduced to several specific songs that can relate to self-image and self-love in a helpful way. 2. Patients will explore the impact of different pieces of music on their feelings, i.e. uplifting, triggering, etc. 3. Patients will discuss how to use this self-knowledge to assist in recovery.  Summary of Patient Progress:  At the beginning of group, patient expressed that she was very sleepy from some medication.  However, she did stay awake throughout group and participated fully.    Therapeutic Modalities: Solution Focused Brief Therapy Activity   Selmer Dominion, LCSW

## 2018-08-20 NOTE — Progress Notes (Signed)
DAR NOTE: Patient presents with calm affect and mood.  Denies suicidal thoughts, pain, auditory and visual hallucinations.  Described energy level as normal and concentration as good.  Rates depression at 0, hopelessness at 0, and anxiety at 5.  Maintained on routine safety checks.  Medications given as prescribed.  Support and encouragement offered as needed.  Attended group and participated.  Patient visible in milieu with minimal interaction with staff and peers.  Patient is safe on and off the unit.

## 2018-08-20 NOTE — B[facility] Suicide Risk Assessment (Signed)
[place] INPATIENT:  Family/Significant Other Suicide Prevention Education  Suicide Prevention Education:  Contact Attempts: mother, Magan Winnett [phone], (name of family member/significant other) has been identified by the patient as the family member/significant other with whom the patient will be residing, and identified as the person(s) who will aid the patient in the event of a mental health crisis.  With written consent from the patient, two attempts were made to provide suicide prevention education, prior to and/or following the patient's discharge.  We were unsuccessful in providing suicide prevention education.  A suicide education pamphlet was given to the patient to share with family/significant other.  Date and time of first attempt:  08/20/2018  /  4:45 PM  Date and time of second attempt:  CSW team to follow up  Maretta Los 08/20/2018, 4:45 PM

## 2018-08-20 NOTE — Progress Notes (Signed)
[facility] MD Progress Note  08/20/2018 9:43 AM BRIEL GALLICCHIO  MRN:  [id number] Subjective: Reports gradual improvement compared to admission but states "I really do feel a lot better than when I came in".  Disturbing visual imagery about seeing herself dead has decreased.  Depressive symptoms have improved and states she feels more "hopeful".  Currently denies suicidal ideations.  Does not endorse medication side effects. Objective : I have reviewed chart notes and have met with patient. 67, lives with mother and children. History of Depression, prior admission years ago. Presented with worsening depression, suicidal ideations, reporting disturbing mental imagery of her being dead in a casket , and ruminations about possibly having STD .  Patient presents with gradually improving mood.  Affect is full in range.  Behavior on unit is in good control and is pleasant on approach/visible in dayroom.  Currently denies medication side effects.  No SI.    Principal Problem: Treatment resistant depression requiring augmentation strategies Diagnosis: Active Problems:   Major depressive disorder, recurrent episode, severe, with psychotic behavior ([place])  Total Time spent with patient: 15 minutes  Past Medical History:  Past Medical History:  Diagnosis Date  . DELAYED MENSES 02/11/2010   Qualifier: Diagnosis of  By: Sherilyn Cooter  MD, Hosie Poisson    . Gestational diabetes   . Hypertension   . Urinary tract infection 03/04/2011    Past Surgical History:  Procedure Laterality Date  . NO PAST SURGERIES     Family History:  Family History  Problem Relation Age of Onset  . Sickle cell trait Sister    Family Psychiatric  History: no new  Social History:  Social History   Substance and Sexual Activity  Alcohol Use No     Social History   Substance and Sexual Activity  Drug Use No    Social History   Socioeconomic History  . Marital status: Single    Spouse name: Not on file  . Number of children: Not on  file  . Years of education: Not on file  . Highest education level: Not on file  Occupational History  . Not on file  Social Needs  . Financial resource strain: Not on file  . Food insecurity    Worry: Not on file    Inability: Not on file  . Transportation needs    Medical: Not on file    Non-medical: Not on file  Tobacco Use  . Smoking status: Never Smoker  . Smokeless tobacco: Never Used  Substance and Sexual Activity  . Alcohol use: No  . Drug use: No  . Sexual activity: Yes    Birth control/protection: Condom  Lifestyle  . Physical activity    Days per week: Not on file    Minutes per session: Not on file  . Stress: Not on file  Relationships  . Social Herbalist on phone: Not on file    Gets together: Not on file    Attends religious service: Not on file    Active member of club or organization: Not on file    Attends meetings of clubs or organizations: Not on file    Relationship status: Not on file  Other Topics Concern  . Not on file  Social History Narrative  . Not on file   Additional Social History:    Sleep: Good  Appetite:  Good  Current Medications: Current Facility-Administered Medications  Medication Dose Route Frequency Provider Last Rate Last Dose  . acetaminophen (TYLENOL)  tablet 650 mg  650 mg Oral Q6H PRN Deloria Lair, NP   650 mg at 08/14/18 1740  . alum & mag hydroxide-simeth (MAALOX/MYLANTA) 200-200-20 MG/5ML suspension 30 mL  30 mL Oral Q4H PRN Dixon, Rashaun M, NP      . amLODipine (NORVASC) tablet 10 mg  10 mg Oral Daily Johnn Hai, MD   10 mg at 08/20/18 0826  . ARIPiprazole (ABILIFY) tablet 15 mg  15 mg Oral Daily Johnn Hai, MD   15 mg at 08/20/18 0827  . clonazePAM (KLONOPIN) tablet 0.5 mg  0.5 mg Oral BID Johnn Hai, MD   0.5 mg at 08/20/18 0825  . cloNIDine (CATAPRES) tablet 0.2 mg  0.2 mg Oral Q4H PRN Johnn Hai, MD   0.2 mg at 08/15/18 0653  . FLUoxetine (PROZAC) capsule 40 mg  40 mg Oral Daily Johnn Hai, MD   40 mg at 08/20/18 0826  . hydrOXYzine (ATARAX/VISTARIL) tablet 50 mg  50 mg Oral Q4H PRN Johnn Hai, MD   50 mg at 08/19/18 2114  . magnesium hydroxide (MILK OF MAGNESIA) suspension 30 mL  30 mL Oral Daily PRN Dixon, Rashaun M, NP      . metroNIDAZOLE (FLAGYL) tablet 500 mg  500 mg Oral Q12H Johnn Hai, MD   500 mg at 08/20/18 0827  . modafinil (PROVIGIL) tablet 200 mg  200 mg Oral Daily Johnn Hai, MD   200 mg at 08/20/18 0827  . prenatal multivitamin tablet 1 tablet  1 tablet Oral Daily Johnn Hai, MD   1 tablet at 08/20/18 0827  . propranolol (INDERAL) tablet 80 mg  80 mg Oral BID Johnn Hai, MD   80 mg at 08/20/18 0826  . temazepam (RESTORIL) capsule 30 mg  30 mg Oral QHS Johnn Hai, MD   30 mg at 08/19/18 2114    Lab Results: No results found for this or any previous visit (from the past 44 hour(s)).  Blood Alcohol level:  Lab Results  Component Value Date   ETH <10 81/27/5170    Metabolic Disorder Labs: No results found for: HGBA1C, MPG No results found for: PROLACTIN Lab Results  Component Value Date   CHOL 219 (H) 11/09/2007   TRIG 83 11/09/2007   HDL 37 (L) 11/09/2007   CHOLHDL 5.9 Ratio 11/09/2007   VLDL 17 11/09/2007   LDLCALC 165 (H) 11/09/2007    Physical Findings: AIMS: Facial and Oral Movements Muscles of Facial Expression: None, normal Lips and Perioral Area: None, normal Jaw: None, normal Tongue: None, normal,Extremity Movements Upper (arms, wrists, hands, fingers): None, normal Lower (legs, knees, ankles, toes): None, normal, Trunk Movements Neck, shoulders, hips: None, normal, Overall Severity Severity of abnormal movements (highest score from questions above): None, normal Incapacitation due to abnormal movements: None, normal Patient's awareness of abnormal movements (rate only patient's report): No Awareness, Dental Status Current problems with teeth and/or dentures?: No Does patient usually wear dentures?: No  CIWA:    COWS:      Musculoskeletal: Strength & Muscle Tone: within normal limits Gait & Station: normal Patient leans: N/A  Psychiatric Specialty Exam: Physical Exam  ROS reports headaches, no chest pain, no shortness of breath, no vomiting , no  fever or shortness of breath  Blood pressure (!) 142/86, pulse 60, temperature 98.7 F (37.1 C), temperature source Oral, resp. rate 16, height 5' 2"  (1.575 m), weight 90.3 kg, last menstrual period 08/09/2018, SpO2 100 %, unknown if currently breastfeeding.Body mass index is 36.4 kg/m.  General  Appearance: improved grooming  Eye Contact:  Good  Speech:  Normal Rate  Volume:  Normal  Mood:  improving   Affect:  More reactive today  Thought Process:  Linear and Descriptions of Associations: Intact  Orientation:  Other:  fully alert and attentive  Thought Content:  Less ruminative, presents future oriented, denies hallucinations, no delusions expressed at this time  Suicidal Thoughts:  No denies suicidal or self injurious ideations, denies homicidal or violent ideations  Homicidal Thoughts:  No  Memory:  recent and remote grossly intact   Judgement:  Other:  improving   Insight:  improving   Psychomotor Activity:  Normal  Concentration:  Concentration: Good  Recall:  Good  Fund of Knowledge:  Good  Language:  Good  Akathisia:  Negative  Handed:  Right  AIMS (if indicated):     Assets:  Physical Health Resilience Social Support  ADL's:  Intact  Cognition:  WNL  Sleep:  Number of Hours: 6.75   Assessment -  52, lives with mother and children. History of Depression, prior admission years ago. Presented with worsening depression, suicidal ideations, reporting disturbing mental imagery of her being dead in a casket , and ruminations about possibly having STD , due to finding out that her prior partner had been unfaithful during their relationship.  Patient continues to improve, reports decreased disturbing visual imagery and decreased negative  ruminations.  States she is feeling better and is presenting with a gradually improving range of affect.  Presents better related overall with improving eye contact.  Tolerating medications well.  Denies SI today.   Treatment Plan Summary: Daily contact with patient to assess and evaluate symptoms and progress in treatment, Medication management and Plan Generally all the pieces are in place she has probably the correct dosings and medications on board I just will take a little more time monitor through the weekend continue current precautions and groups and individual work  Treatment plan reviewed as below today 7/12 Treatment team working on disposition planning options Encourage group and milieu participation Continue Abilify 15 mgrs QDAY for mood disorder Continue Klonopin 0.5 mgrs BID for anxiety Continue Prozac 40 mgrs QDAY for depression and anxiety Continue Norvasc 10 mgrs QDAY , Inderal 80 mgrs BID for HTN  ( BP today 103/64, pulse 64)  Continue Provigil 200 mgrs QDAY   Jenne Campus, MD 08/20/2018, 9:43 AM   Patient ID: Estill Bakes, female   DOB: 1980/03/26, 38 y.o.   MRN: [id number]

## 2018-08-21 MED ORDER — FLUOXETINE HCL 40 MG PO CAPS: 40.0000 mg | ORAL_CAPSULE | Freq: Every day | ORAL | 1 refills | Status: DC

## 2018-08-21 MED ORDER — PRENATAL MULTIVITAMIN CH: 1.0000 | ORAL_TABLET | Freq: Every day | ORAL | 2 refills | Status: DC

## 2018-08-21 MED ORDER — PROPRANOLOL HCL 80 MG PO TABS: 80.0000 mg | ORAL_TABLET | Freq: Two times a day (BID) | ORAL | 2 refills | Status: DC

## 2018-08-21 MED ORDER — ARIPIPRAZOLE 15 MG PO TABS: 15.0000 mg | ORAL_TABLET | Freq: Every day | ORAL | 1 refills | Status: DC

## 2018-08-21 MED ORDER — CLONAZEPAM 0.5 MG PO TABS: 0.5000 mg | ORAL_TABLET | Freq: Two times a day (BID) | ORAL | 0 refills | Status: DC

## 2018-08-21 MED ORDER — TEMAZEPAM 30 MG PO CAPS: 30.0000 mg | ORAL_CAPSULE | Freq: Every day | ORAL | 0 refills | Status: DC

## 2018-08-21 MED ORDER — METRONIDAZOLE 500 MG PO TABS: 500.0000 mg | ORAL_TABLET | Freq: Two times a day (BID) | ORAL | 0 refills | Status: AC

## 2018-08-21 NOTE — BHH Suicide Risk Assessment (Signed)
[facility] Discharge Suicide Risk Assessment   Principal Problem:MDD  Discharge Diagnoses: Active Problems:   Major depressive disorder, recurrent episode, severe, with psychotic behavior ([place])   Total Time spent with patient: 45 minutes  Musculoskeletal: Strength & Muscle Tone: within normal limits Gait & Station: normal Patient leans: N/A  Psychiatric Specialty Exam: ROS  Blood pressure (!) 142/94, pulse (!) 48, temperature 98.4 F (36.9 C), temperature source Oral, resp. rate 16, height 5\' 2"  (1.575 m), weight 90.3 kg, last menstrual period 08/09/2018, SpO2 100 %, unknown if currently breastfeeding.Body mass index is 36.4 kg/m.  General Appearance: Casual  Eye Contact::  Good  Speech:  Clear and Coherent409  Volume:  Normal  Mood:  Euthymic  Affect:  Appropriate  Thought Process:  Coherent and Descriptions of Associations: Intact  Orientation:  Full (Time, Place, and Person)  Thought Content:  Logical  Suicidal Thoughts:  No  Homicidal Thoughts:  No  Memory:  Recent;   Good  Judgement:  Good  Insight:  Good  Psychomotor Activity:  Normal  Concentration:  Fair  Recall:  Good  Fund of Knowledge:Good  Language: Good  Akathisia:  Negative  Handed:  Right  AIMS (if indicated):     Assets:  Communication Skills Desire for Improvement  Sleep:  Number of Hours: 6.75  Cognition: WNL  ADL's:  Intact   Mental Status Per Nursing Assessment::   On Admission:  NA  Demographic Factors:  NA  Loss Factors: NA  Historical Factors: NA  Risk Reduction Factors:   Sense of responsibility to family and Religious beliefs about death  Continued Clinical Symptoms:  Dysthymia  Cognitive Features That Contribute To Risk:  None    Suicide Risk:  Minimal: No identifiable suicidal ideation.  Patients presenting with no risk factors but with morbid ruminations; may be classified as minimal risk based on the severity of the depressive symptoms  Follow-up Information    [facility]  Follow up on 08/21/2018.   Why: Telephonic hospital follow up appointment is Monday, 7/13 at 8:30a.  The provider will contact you the day of the appointment.  Contact information: [address] [place] [address] [phone]           Plan Of Care/Follow-up recommendations:  Activity:  full  Kendra Lungren, MD 08/21/2018, 7:40 AM

## 2018-08-21 NOTE — Discharge Summary (Signed)
Physician Discharge Summary Note  Patient:  Kendra Harris is an 38 y.o., female MRN:  [id number] DOB:  [birth year] Patient phone:  [phone] (home)  Patient address:   [address] [place] [id number],  Total Time spent with patient: 45 minutes  Date of Admission:  08/14/2018 Date of Discharge: 08/21/2018  Reason for Admission:   This is the second admission here but the first since 2003 for [name], 38 year old single patient who presents with severe depressive symptoms, intrusive ruminations and thoughts about harming herself, and intrusive visions of seeing her self in a casket but these are not true hallucinations but rather an expression of the severity of her depressive illness. She is not currently on medication for depression/anxiety or psychosis but does have a history of being diagnosed with depression with psychotic features but again this may simply be referring to the intrusive imagery which is disturbing.  She is tearful on exam, she had presented to primary care on 7/1 concerned about sexually transmitted diseases.  The origin of this is that she states her partner was unfaithful and learned that this individual had another partner and that got her ruminating about the possibility of sexually transmitted diseases and she is eager for the results. Screen negative for all compounds tested, on review of systems she has essential hypertension.  Present she is alert oriented fully and cooperative tearful and anxious denies wanting to harm self here but states the thoughts are very intrusive and problematic for her and she would like them addressed.   Principal Problem: MDD Discharge Diagnoses: Active Problems:   Major depressive disorder, recurrent episode, severe, with psychotic behavior [facility](HCC)   Past Psychiatric History: MDD  Past Medical History:  Past Medical History:  Diagnosis Date  . DELAYED MENSES 02/11/2010   Qualifier: Diagnosis of  By: Wallene Huharew  MD, Rande LawmanKhary     . Gestational diabetes   . Hypertension   . Urinary tract infection 03/04/2011    Past Surgical History:  Procedure Laterality Date  . NO PAST SURGERIES     Family History:  Family History  Problem Relation Age of Onset  . Sickle cell trait Sister    Family Psychiatric  History: no new data Social History:  Social History   Substance and Sexual Activity  Alcohol Use No     Social History   Substance and Sexual Activity  Drug Use No    Social History   Socioeconomic History  . Marital status: Single    Spouse name: Not on file  . Number of children: Not on file  . Years of education: Not on file  . Highest education level: Not on file  Occupational History  . Not on file  Social Needs  . Financial resource strain: Not on file  . Food insecurity    Worry: Not on file    Inability: Not on file  . Transportation needs    Medical: Not on file    Non-medical: Not on file  Tobacco Use  . Smoking status: Never Smoker  . Smokeless tobacco: Never Used  Substance and Sexual Activity  . Alcohol use: No  . Drug use: No  . Sexual activity: Yes    Birth control/protection: Condom  Lifestyle  . Physical activity    Days per week: Not on file    Minutes per session: Not on file  . Stress: Not on file  Relationships  . Social connections    Talks on phone: Not on file  Gets together: Not on file    Attends religious service: Not on file    Active member of club or organization: Not on file    Attends meetings of clubs or organizations: Not on file    Relationship status: Not on file  Other Topics Concern  . Not on file  Social History Narrative  . Not on file    Hospital Course:   As discussed [name] was admitted under routine precautions, fluoxetine was escalated to 40 mg a day, augmentation strategies included modafinil, prenatal vitamins, and Vraylar initially but she did not find much benefit from that as far as racing thoughts and intrusive  thoughts so we changed her to aripiprazole with good result.  She continued to engage in cognitive-based therapy.  By the date of the 13th she was requesting discharge home her affect was bright, she was alert and fully oriented and fully cooperative she reported her mood was "better than when she came in" almost euthymic she had a little bit of anhedonia on screening but of course she been isolated here but again no thoughts of harming self or others and fully able to contract for safety.  She will likely not be able to afford modafinil but other augmentation strategies continue.  Physical Findings: AIMS: Facial and Oral Movements Muscles of Facial Expression: None, normal Lips and Perioral Area: None, normal Jaw: None, normal Tongue: None, normal,Extremity Movements Upper (arms, wrists, hands, fingers): None, normal Lower (legs, knees, ankles, toes): None, normal, Trunk Movements Neck, shoulders, hips: None, normal, Overall Severity Severity of abnormal movements (highest score from questions above): None, normal Incapacitation due to abnormal movements: None, normal Patient's awareness of abnormal movements (rate only patient's report): No Awareness, Dental Status Current problems with teeth and/or dentures?: No Does patient usually wear dentures?: No  CIWA:    COWS:    Musculoskeletal: Strength & Muscle Tone: within normal limits Gait & Station: normal Patient leans: N/A  Psychiatric Specialty Exam: ROS  Blood pressure (!) 142/94, pulse (!) 48, temperature 98.4 F (36.9 C), temperature source Oral, resp. rate 16, height 5\' 2"  (1.575 m), weight 90.3 kg, last menstrual period 08/09/2018, SpO2 100 %, unknown if currently breastfeeding.Body mass index is 36.4 kg/m.  General Appearance: Casual  Eye Contact::  Good  Speech:  Clear and Coherent409  Volume:  Normal  Mood:  Euthymic  Affect:  Appropriate  Thought Process:  Coherent and Descriptions of Associations: Intact  Orientation:   Full (Time, Place, and Person)  Thought Content:  Logical  Suicidal Thoughts:  No  Homicidal Thoughts:  No  Memory:  Recent;   Good  Judgement:  Good  Insight:  Good  Psychomotor Activity:  Normal  Concentration:  Fair  Recall:  Good  Fund of Knowledge:Good  Language: Good  Akathisia:  Negative  Handed:  Right  AIMS (if indicated):     Assets:  Communication Skills Desire for Improvement  Sleep:  Number of Hours: 6.75  Cognition: WNL  ADL's:  Intact    Have you used any form of tobacco in the last 30 days? (Cigarettes, Smokeless Tobacco, Cigars, and/or Pipes): No  Has this patient used any form of tobacco in the last 30 days? (Cigarettes, Smokeless Tobacco, Cigars, and/or Pipes) Yes, No  Blood Alcohol level:  Lab Results  Component Value Date   ETH <10 08/14/2018    Metabolic Disorder Labs:  No results found for: HGBA1C, MPG No results found for: PROLACTIN Lab Results  Component Value Date  CHOL 219 (H) 11/09/2007   TRIG 83 11/09/2007   HDL 37 (L) 11/09/2007   CHOLHDL 5.9 Ratio 11/09/2007   VLDL 17 11/09/2007   LDLCALC 165 (H) 11/09/2007    See Psychiatric Specialty Exam and Suicide Risk Assessment completed by Attending Physician prior to discharge.  Discharge destination:  Home  Is patient on multiple antipsychotic therapies at discharge:  No   Has Patient had three or more failed trials of antipsychotic monotherapy by history:  No  Recommended Plan for Multiple Antipsychotic Therapies: NA   Allergies as of 08/21/2018   No Known Allergies     Medication List    TAKE these medications     Indication  acetaminophen 325 MG tablet Commonly known as: Tylenol Take 2 tablets (650 mg total) by mouth every 4 (four) hours as needed (for pain scale < 4).  Indication: Pain   amLODipine 10 MG tablet Commonly known as: NORVASC Take 1 tablet (10 mg total) by mouth daily. Needs to make a BP follow up for further prescriptions  Indication: High Blood Pressure  Disorder   ARIPiprazole 15 MG tablet Commonly known as: ABILIFY Take 1 tablet (15 mg total) by mouth daily.  Indication: Major Depressive Disorder   clonazePAM 0.5 MG tablet Commonly known as: KLONOPIN Take 1 tablet (0.5 mg total) by mouth 2 (two) times daily for 10 days.  Indication: Feeling Anxious   FLUoxetine 40 MG capsule Commonly known as: PROZAC Take 1 capsule (40 mg total) by mouth daily.  Indication: Depression   hydrochlorothiazide 25 MG tablet Commonly known as: HYDRODIURIL Take 1 tablet (25 mg total) by mouth daily. Needs to make a BP follow up for further prescriptions.  Indication: Edema, High Blood Pressure Disorder   metroNIDAZOLE 500 MG tablet Commonly known as: FLAGYL Take 1 tablet (500 mg total) by mouth every 12 (twelve) hours for 4 days.  Indication: Infection caused by Trichomoniasis Protozoa   prenatal multivitamin Tabs tablet Take 1 tablet by mouth daily. Fill as Enbrace HR is covered  Indication: Vitamin Deficiency   propranolol 80 MG tablet Commonly known as: INDERAL Take 1 tablet (80 mg total) by mouth 2 (two) times daily.  Indication: High Blood Pressure Disorder   temazepam 30 MG capsule Commonly known as: RESTORIL Take 1 capsule (30 mg total) by mouth at bedtime.  Indication: Trouble Sleeping      Follow-up Information    [facility] Follow up on 08/21/2018.   Why: Telephonic hospital follow up appointment is Monday, 7/13 at 8:30a.  The provider will contact you the day of the appointment.  Contact information: [address] [place] [address] [phone]           Follow-up recommendations:  Activity:  full  Comments:  F/up as directed  SignedJohnn Hai, MD 08/21/2018, 7:48 AM

## 2018-08-21 NOTE — Progress Notes (Signed)
D.  Pt pleasant on approach, no complaints voiced.  Pt was observed interacting appropriately with peers on the unit.  Pt denies SI/HI/AVH at this time.  A.  Support and encouragement offered, medication given as ordered  R. Pt remains safe on the unit, will continue to monitor.

## 2018-08-21 NOTE — Progress Notes (Signed)
  [facility] Adult Case Management Discharge Plan :  Will you be returning to the same living situation after discharge:  Yes,  returning home with her mother At discharge, do you have transportation home?: Yes,  patient's mother is picking her up Do you have the ability to pay for your medications: Yes,  Medicaid  Release of information consent forms completed and in the chart;  Patient's signature needed at discharge.  Patient to Follow up at: Follow-up Information    [facility] Follow up on 08/23/2018.   Why: Telephonic hospital follow up appointment is Wednesday, 7/15 at 11:30a.  The provider will contact you. Contact information: [address] [place] [address] [phone]           Next level of care provider has access to [place] and Suicide Prevention discussed: Yes,  with the patient  Have you used any form of tobacco in the last 30 days? (Cigarettes, Smokeless Tobacco, Cigars, and/or Pipes): No  Has patient been referred to the Quitline?: N/A patient is not a smoker  Patient has been referred for addiction treatment: N/A  Marylee Floras, [place] 08/21/2018, 9:50 AM

## 2018-08-21 NOTE — Progress Notes (Signed)
Recreation Therapy Notes  Date:  7.13.20 Time: 0930 Location: [address] Dayroom  Group Topic: Stress Management  Goal Area(s) Addresses:  Patient will identify positive stress management techniques. Patient will identify benefits of using stress management post d/c.  Intervention:  Stress Management  Activity :  Meditation.  LRT played a meditation that focused on making the most of your day.  Patients were to listen and follow along as the meditation played to engage in activity.  Education:  Stress Management, Discharge Planning.   Education Outcome: Acknowledges Education  Clinical Observations/Feedback:  Pt did not attend group.    Nevea Spiewak, LRT/CTRS         Janzen Sacks A 08/21/2018 11:26 AM 

## 2018-08-21 NOTE — Progress Notes (Signed)
Pt discharged to lobby. Pt was stable and appreciative at that time. All papers and prescriptions were given and valuables returned. Verbal understanding expressed. Denies SI/HI and A/VH. Pt given opportunity to express concerns and ask questions.  

## 2018-08-21 NOTE — [facility] Suicide Risk Assessment (Addendum)
[place] INPATIENT:  Family/Significant Other Suicide Prevention Education  Suicide Prevention Education:  Contact Attempts: with the patient's mother, Jodee Wagenaar [phone]) has been identified by the patient as the family member/significant other with whom the patient will be residing, and identified as the person(s) who will aid the patient in the event of a mental health crisis.  With written consent from the patient, two attempts were made to provide suicide prevention education, prior to and/or following the patient's discharge.  We were unsuccessful in providing suicide prevention education.  A suicide education pamphlet was given to the patient to share with family/significant other.  Date and time of first attempt:08/20/2018 / 4:45pm Date and time of second attempt: 08/21/2018 / 9:32am   SPE completed with patient.SPI pamphlet provided to pt and pt was encouraged to share information with support network, ask questions, and talk about any concerns relating to SPE. Patient denies access to guns/firearms and verbalized understanding of information provided. Mobile Crisis information also provided to patient.    Kendra Harris 08/21/2018, 9:30 AM

## 2019-03-02 ENCOUNTER — Other Ambulatory Visit: Payer: Self-pay

## 2019-03-02 ENCOUNTER — Other Ambulatory Visit: Payer: Self-pay | Admitting: Family Medicine

## 2019-03-02 MED ORDER — PROPRANOLOL HCL 80 MG PO TABS: 80.0000 mg | ORAL_TABLET | Freq: Two times a day (BID) | ORAL | 5 refills | Status: DC

## 2019-03-02 MED ORDER — PROPRANOLOL HCL 80 MG PO TABS: 80.0000 mg | ORAL_TABLET | Freq: Two times a day (BID) | ORAL | 0 refills | Status: DC

## 2019-03-02 NOTE — Telephone Encounter (Signed)
Pt sent in refill for propranalol BP medication. Patient states that she has been out for 2 days and is starting to develop a headache.   Will send another rx request for additional refills.   To PCP  Veronda Prude, RN

## 2019-03-06 ENCOUNTER — Other Ambulatory Visit: Payer: Self-pay

## 2019-03-06 ENCOUNTER — Ambulatory Visit (INDEPENDENT_AMBULATORY_CARE_PROVIDER_SITE_OTHER): Payer: Medicaid Other | Admitting: Family Medicine

## 2019-03-06 DIAGNOSIS — F333 Major depressive disorder, recurrent, severe with psychotic symptoms: Secondary | ICD-10-CM | POA: Diagnosis present

## 2019-03-06 MED ORDER — TRAZODONE HCL 50 MG PO TABS: 25.0000 mg | ORAL_TABLET | Freq: Every evening | ORAL | 0 refills | Status: DC | PRN

## 2019-03-06 MED ORDER — FLUOXETINE HCL 20 MG PO CAPS: 60.0000 mg | ORAL_CAPSULE | Freq: Every day | ORAL | 3 refills | Status: DC

## 2019-03-06 NOTE — Progress Notes (Signed)
    Subjective:  Kendra Harris is a 39 y.o. female who presents to the [facility] today with a chief complaint of new episode of depression.   HPI:  MDD Kendra Harris has a long history of depression.  She reports going in and out of depression since she was a teenager.  She denies ever having had a episode of mania.  She thinks that she is beginning another episode of depression because she has been having many racing thoughts recently in addition to a lot of trouble sleeping.  She is barely able to concentrate at all at home and finds herself sleeping about 2 hours a night.  This feels very similar to her previous episodes of depression.  She is filled out a PHQ-9 here in the clinic and denies symptoms of harming herself or others.  In the past, she is found the most helpful elements of therapy to be medication and family support.  She currently lives with her mother and feels supported.  She currently takes fluoxetine 40 mg daily.   Objective:  Physical Exam: BP 138/74   Pulse (!) 52   Wt 256 lb 6.4 oz (116.3 kg)   LMP 01/20/2019 (Approximate)   SpO2 99%   BMI 46.90 kg/m    Gen: NAD, resting comfortably.  Obese middle-aged woman. CV: RRR with no murmurs appreciated Pulm: NWOB, CTAB with no crackles, wheezes, or rhonchi GI: Normal bowel sounds present. Soft, Nontender, Nondistended. MSK: no edema, cyanosis, or clubbing noted Skin: warm, dry Neuro: grossly normal, moves all extremities Psych: Normal affect and thought content  No results found for this or any previous visit (from the past 72 hour(s)).   Assessment/Plan:  Major depressive disorder, recurrent episode, severe, with psychotic behavior (HCC) Currently experiencing symptoms of depression consistent with previous depressive episodes.  No SI/HI.  PHQ-9 score of 11.  Consistent with mild episode of depression. -Increase fluoxetine to 60 mg daily -Trazodone 50 mg nightly for sleep -List of Medicaid accepting therapy  providers given, encouraged to follow-up with CBT for depression -Follow-up in 3 weeks

## 2019-03-06 NOTE — Assessment & Plan Note (Signed)
Currently experiencing symptoms of depression consistent with previous depressive episodes.  No SI/HI.  PHQ-9 score of 11.  Consistent with mild episode of depression. -Increase fluoxetine to 60 mg daily -Trazodone 50 mg nightly for sleep -List of Medicaid accepting therapy providers given, encouraged to follow-up with CBT for depression -Follow-up in 3 weeks

## 2019-03-06 NOTE — Assessment & Plan Note (Signed)
>>  ASSESSMENT AND PLAN FOR MAJOR DEPRESSIVE DISORDER, RECURRENT EPISODE, SEVERE, WITH PSYCHOTIC BEHAVIOR (HCC) WRITTEN ON 03/06/2019  9:09 AM BY Mirian Mo, MD  Currently experiencing symptoms of depression consistent with previous depressive episodes.  No SI/HI.  PHQ-9 score of 11.  Consistent with mild episode of depression. -Increase fluoxetine to 60 mg daily -Trazodone 50 mg nightly for sleep -List of Medicaid accepting therapy providers given, encouraged to follow-up with CBT for depression -Follow-up in 3 weeks

## 2019-03-06 NOTE — Patient Instructions (Signed)
Is a quick summary of what we are doing for you today.  Depression: The ideal treatment is medication plus therapy.  Today, we increased your medication to fluoxetine 60 mg a day.  Below, you will find information about therapists that accept Medicaid and other providers in the community.  Please come back to clinic in 3 weeks and we will see how you are doing and consider changing her medication again at that time.    11 Tips to Follow: 1. No caffeine after 3pm: Avoid beverages with caffeine (soda, tea, energy drinks, etc.) especially after 3pm.  2. Don't go to bed hungry: Have your evening meal at least 3 hrs. before going to sleep. It's fine to have a small bedtime snack such as a glass of milk and a few crackers but don't have a big meal.  3. Have a nightly routine before bed: Plan on "winding down" before you go to sleep. Begin relaxing about 1 hour before you go to bed. Try doing a quiet activity such as listening to calming music, reading a book or meditating.  4. Turn off the TV and ALL electronics including video games, tablets, laptops, etc. 1 hour before sleep, and keep them out of the bedroom.  5. Turn off your cell phone and all notifications (new email and text alerts) or even better, leave your phone outside your room while you sleep. Studies have shown that a part of your brain continues to respond to certain lights and sounds even while you're still asleep.  6. Make your bedroom quiet, dark and cool. If you can't control the noise, try wearing earplugs or using a fan to block out other sounds.  7. Practice relaxation techniques. Try reading a book or meditating or drain your brain by writing a list of what you need to do the next day.  8. Don't nap unless you feel sick: you'll have a better night's sleep.  9. Don't smoke, or quit if you do. Nicotine, alcohol, and marijuana can all keep you awake. Talk to your health care provider if you need help with substance  use.  10. Most importantly, wake up at the same time every day (or within 1 hour of your usual wake up time) EVEN on the weekends. A regular wake up time promotes sleep hygiene and prevents sleep problems.  11. Reduce exposure to bright light in the last three hours of the day before going to sleep.  Maintaining good sleep hygiene and having good sleep habits lower your risk of developing sleep problems. Getting better sleep can also improve your concentration and alertness. Try the simple steps in this guide. If you still have trouble getting enough rest, make an appointment with your health care provider.  Psychiatry Resource List (Adults and Children) Most of these providers will take Medicaid. please consult your insurance for a complete and updated list of available providers. When calling to make an appointment have your insurance information available to confirm you are covered.  [facility]:   [place]: [address]     [phone] [place] Ace: [address] [place],        [phone] [place]: [address],    [phone] [place] Sharper: [address],                   [phone]  [facility]  (Psychiatry & counseling ; adults & children ; will take Medicaid) [address] [facility] [place]  [address], [place], [place]        [phone]   [facility]  (Psychiatry only; Adults only, will take Medicaid)  [address], [place], [place] [address]       [phone]   [facility] (Psychiatry & counseling ; adults & children ; will take Medicaid [address]  [place] [address]   [phone]    (Spanish therapist)  [facility]  Psychiatry & counseling; Adults and children;  [address]    [place], [place] [address]    [phone]  [facility] (Psychiatry & counseling; adults & children; Medicare no Medicaid)  [address]   [place], [place]  [address]      [phone]    [facility] (up to age 46)  Psychiatry & counseling ,will take Medicaid, must do counseling to receive psychiatry services  [address] [place] [address]        [phone]  [facility] (Psychiatry & counseling; adults & children; will take Medicaid) [address],  [place], [place]  [phone]  [facility]---  Walk-in Mon-Fri, 8:30-5:00 (will take Medicaid)  [address], [place], [place]  [phone]    [facility] --- Walk-In Mon-Friday 8am-3pm ( will take Medicaid, Psychiatry, Adults & children,  [address] [place], [place]   [phone]   [facility] [place]--, Walk-in M-F 8am-12pm and 1pm -3pm   (Counseling, Psychiatry, will take Medicaid, adults & children)  [address] [place], [place]  [phone]   Outpatient Mental Health Providers (No Insurance at time of Visit or Self Pay)  [facility] Mon-Fri, 8:30-5:00  [address] [place], [place] [address]    [phone] [email or web address]  [phone] (Immediate assistance)  [facility]    Walk-in Mon-Fri, 8am-3pm [address] [place], [place]  [phone]  [email or web address]  [facility] [place] ([facility]) walk in M-F 8am-12pm and  1pm-3pm [place]- [address]     [phone]  [employer] [address]  Phone: [phone]  [facility] (Mental Health and substance challenges) [address]   [place] [phone]    [email or web address]    [facility]  [place] -[address], [place]     Phone:  [phone] [facility]-  [address]  [phone]         [facility] Walk-in MWF 12:30 to 3:00     [address] [place] [address]  [phone]  [email or web address].[email or web address] call to schedule an appointment    [facility] Classes ,Support  group, Peer support services, [address] [place], [place] [address] [phone]  PhotoSolver.[email or web address]           National Alliance on Mental Illness (NAMI) [place]- Wellness classes, Support groups        [address] [place], [place] [phone]   [email or web address]  [facility]  (Psycho-social Rehabilitation clubhouse, Individual and group therapy) [address] [place], [place] [address]   [phone]  24- Hour Availability:  [facility] [phone] or [phone] * [facility] [employer] (Domestic Violence, Rape, etc. )[phone] Vesta Mixer [phone] or [phone] * [facility] [phone] only) [phone] (after hours) *[facility] Mobile Crisis Unit [phone] *[place] National  Suicide Hotline [phone] Diamantina Monks)

## 2019-04-10 ENCOUNTER — Ambulatory Visit (INDEPENDENT_AMBULATORY_CARE_PROVIDER_SITE_OTHER): Payer: Medicaid Other | Admitting: Family Medicine

## 2019-04-10 ENCOUNTER — Encounter: Payer: Self-pay | Admitting: Family Medicine

## 2019-04-10 ENCOUNTER — Other Ambulatory Visit: Payer: Self-pay

## 2019-04-10 DIAGNOSIS — F333 Major depressive disorder, recurrent, severe with psychotic symptoms: Secondary | ICD-10-CM | POA: Diagnosis not present

## 2019-04-10 DIAGNOSIS — D649 Anemia, unspecified: Secondary | ICD-10-CM | POA: Diagnosis not present

## 2019-04-10 DIAGNOSIS — Z113 Encounter for screening for infections with a predominantly sexual mode of transmission: Secondary | ICD-10-CM | POA: Diagnosis not present

## 2019-04-10 LAB — POCT GLYCOSYLATED HEMOGLOBIN (HGB A1C): HbA1c, POC (controlled diabetic range): 5.7 % (ref 0.0–7.0)

## 2019-04-10 NOTE — Progress Notes (Signed)
    SUBJECTIVE:   CHIEF COMPLAINT / HPI:   STI: Patient has been with one new partner in the past month..  They have been wearing condoms.  She desires blood STI checks, but declines vaginal swabs.  Depression: Patient currently taking fluoxetine 60mg , trazodone 50mg .  [facility] app[facility].  Patient no longer taking her trazodone.  She says she feels more agitated more than sad as of late.  Denies any social stressors such as money issues, employment issues.  Denies appetite change.  Is starting to sleep better.  Would like to try therapy first before adding a second agent or increasing the dose of this agent.  Doesn't want flu vaccine.    PERTINENT  PMH / PSH: Depression  OBJECTIVE:   BP (!) 142/110   Pulse 64   Wt 257 lb 3.2 oz (116.7 kg)   LMP 03/17/2019 (Approximate)   SpO2 99%   BMI 47.04 kg/m   General: Alert and oriented, no acute distress. CV: Regular rate and rhythm, no murmurs. Pulmonary: Lungs clear to auscultation bilaterally, no crackles or wheezes Psych: Normal affect.  Makes eye contact.  Spontaneous speech.  ASSESSMENT/PLAN:   Routine screening for STI (sexually transmitted infection) Patient often comes in to get evaluated for STIs.  Patient wants HIV in RPR today.  Does not want gonorrhea chlamydia test.  New partner, monogamous relationship, using barrier protection -Follow-up [facility] and HIV  Major depressive disorder, recurrent episode, severe, with psychotic behavior (HCC) Complains that she has not felt any improvement since increasing the SSRI dose and adding trazodone to help with sleep, but history indicates that she is sleeping better, with normal appetite.  Her PHQ-9 shows score of 3 which would indicate significant improvement.  Will await until patient has spoken with therapist before we make any medication changes.  Anemia Noticed on chart review that patient hemoglobin is consistently below 12 with MCV in  the low 80s.  Will get CBC and needle in what up for possible iron deficiency anemia.     , MD [facility]

## 2019-04-10 NOTE — Patient Instructions (Addendum)
It was nice to see you again today.    I will call you with the results of your STI tests when I receive them.    We will wait to see how therapy goes before changing your antidepressant medications further.    I would like to see you again in 6 weeks.

## 2019-04-11 DIAGNOSIS — D649 Anemia, unspecified: Secondary | ICD-10-CM | POA: Insufficient documentation

## 2019-04-11 NOTE — Assessment & Plan Note (Signed)
Patient often comes in to get evaluated for STIs.  Patient wants HIV in [facility] today.  Does not want gonorrhea chlamydia test.  New partner, monogamous relationship, using barrier protection -Follow-up RPR and HIV

## 2019-04-11 NOTE — Assessment & Plan Note (Signed)
Noticed on chart review that patient hemoglobin is consistently below 12 with MCV in the low 80s.  Will get CBC and needle in what up for possible iron deficiency anemia.

## 2019-04-11 NOTE — Assessment & Plan Note (Signed)
Complains that she has not felt any improvement since increasing the SSRI dose and adding trazodone to help with sleep, but history indicates that she is sleeping better, with normal appetite.  Her PHQ-9 shows score of 3 which would indicate significant improvement.  Will await until patient has spoken with therapist before we make any medication changes.

## 2019-04-11 NOTE — Assessment & Plan Note (Signed)
>>  ASSESSMENT AND PLAN FOR MAJOR DEPRESSIVE DISORDER, RECURRENT EPISODE, SEVERE, WITH PSYCHOTIC BEHAVIOR (HCC) WRITTEN ON 04/11/2019  7:45 PM BY Sandre Kitty, MD  Complains that she has not felt any improvement since increasing the SSRI dose and adding trazodone to help with sleep, but history indicates that she is sleeping better, with normal appetite.  Her PHQ-9 shows score of 3 which would indicate significant improvement.  Will await until patient has spoken with therapist before we make any medication changes.

## 2019-04-12 LAB — ANEMIA PANEL
Ferritin: 16 ng/mL (ref 15–150)
Folate, Hemolysate: 344 ng/mL
Folate, RBC: 920 ng/mL (ref >498)
Hematocrit: 37.4 % (ref 34.0–46.6)
Iron Saturation: 11 % — ABNORMAL LOW (ref 15–55)
Iron: 37 ug/dL (ref 27–159)
Retic Ct Pct: 1.5 % (ref 0.6–2.6)
Total Iron Binding Capacity: 335 ug/dL (ref 250–450)
UIBC: 298 ug/dL (ref 131–425)
Vitamin B-12: 458 pg/mL (ref 232–1245)

## 2019-04-12 LAB — CBC
Hemoglobin: 11.7 g/dL (ref 11.1–15.9)
MCH: 26 pg — ABNORMAL LOW (ref 26.6–33.0)
MCHC: 31.3 g/dL — ABNORMAL LOW (ref 31.5–35.7)
MCV: 83 fL (ref 79–97)
Platelets: 300 10*3/uL (ref 150–450)
RBC: 4.5 x10E6/uL (ref 3.77–5.28)
RDW: 13 % (ref 11.7–15.4)
WBC: 8.1 10*3/uL (ref 3.4–10.8)

## 2019-04-12 LAB — HIV ANTIBODY (ROUTINE TESTING W REFLEX): HIV Screen 4th Generation wRfx: NONREACTIVE

## 2019-04-12 LAB — RPR: RPR Ser Ql: NONREACTIVE

## 2019-04-13 ENCOUNTER — Telehealth: Payer: Self-pay | Admitting: Family Medicine

## 2019-04-13 NOTE — Telephone Encounter (Signed)
Have tried to call the patient twice regarding her BP medications.  Appears to have previously been on amlodipine and hctz but does not appear to be on anything currently and BP was high at previous visit.    Did not answer x2.  Unable to leave voicemail.

## 2019-04-30 ENCOUNTER — Encounter (HOSPITAL_COMMUNITY): Payer: Self-pay | Admitting: Family Medicine

## 2019-04-30 ENCOUNTER — Other Ambulatory Visit: Payer: Self-pay

## 2019-04-30 ENCOUNTER — Ambulatory Visit (HOSPITAL_COMMUNITY)
Admission: EM | Admit: 2019-04-30 | Discharge: 2019-04-30 | Disposition: A | Discharge status: Home / Self Care | Payer: Medicaid Other | Attending: Family Medicine | Admitting: Family Medicine

## 2019-04-30 DIAGNOSIS — Z8632 Personal history of gestational diabetes: Secondary | ICD-10-CM | POA: Insufficient documentation

## 2019-04-30 DIAGNOSIS — I10 Essential (primary) hypertension: Secondary | ICD-10-CM | POA: Insufficient documentation

## 2019-04-30 DIAGNOSIS — R059 Cough, unspecified: Secondary | ICD-10-CM

## 2019-04-30 DIAGNOSIS — R05 Cough: Secondary | ICD-10-CM | POA: Diagnosis not present

## 2019-04-30 DIAGNOSIS — Z20822 Contact with and (suspected) exposure to covid-19: Secondary | ICD-10-CM | POA: Diagnosis not present

## 2019-04-30 DIAGNOSIS — Z79899 Other long term (current) drug therapy: Secondary | ICD-10-CM | POA: Diagnosis not present

## 2019-04-30 NOTE — ED Provider Notes (Signed)
[facility]    CSN: [id number] Arrival date & time: 04/30/19  4098      History   Chief Complaint Chief Complaint  Patient presents with  . Cough    HPI Kendra Harris is a 39 y.o. female.   This is a 39 yo established [facility] patient who presents with cough and evaluation for COVID-19.  Patient has a h/o hypertension, depression and anemia from menorrhagia.  She was seen earlier this month and CBC was unremarkable with hgb of 11.7.  Hgb A1C at the time was normal.  She had a COVID-19 TEST last summer that was negative.   Currently, mother has COVID(patient was with her 4 days ago)  and patient has had the cough for 3 days now.     Past Medical History:  Diagnosis Date  . DELAYED MENSES 02/11/2010   Qualifier: Diagnosis of  By: Wallene Huh  MD, Rande Lawman    . Gestational diabetes   . Hypertension     03/04/2011    Patient Active Problem List   Diagnosis Date Noted  . Anemia 04/11/2019  . Major depressive disorder, recurrent episode, severe, with psychotic behavior (HCC) 08/14/2018  .  08/09/2018  .  08/09/2018    01/24/2014  . Menorrhagia 04/27/2013    07/24/2010  . MIGRAINE HEADACHE 12/13/2007  . Essential hypertension, benign 08/31/2007  . Morbid obesity with BMI of 45.0-49.9, adult (HCC) 04/07/2006    Past Surgical History:  Procedure Laterality Date  . NO PAST SURGERIES      OB History    Gravida  5   Para  5   Term  5   Preterm  0   AB  0   Living  5     SAB  0   TAB  0   Ectopic  0   Multiple  0   Live Births  5            Home Medications    Prior to Admission medications   Medication Sig Start Date End Date Taking? Authorizing Provider  FLUoxetine (PROZAC) 20 MG capsule Take 3 capsules (60 mg total) by mouth daily. 03/06/19   Mirian Mo, MD  Prenatal Vit-Fe Fumarate-FA (PRENATAL MULTIVITAMIN) TABS tablet Take 1 tablet by mouth daily. Fill as [employer] is covered 08/21/18   Malvin Johns, MD  propranolol (INDERAL) 80 MG  tablet Take 1 tablet (80 mg total) by mouth 2 (two) times daily. 03/02/19   Sandre Kitty, MD  traZODone (DESYREL) 50 MG tablet Take 0.5-1 tablets (25-50 mg total) by mouth at bedtime as needed for sleep. 03/06/19   Mirian Mo, MD    Family History Family History  Problem Relation Age of Onset  . Sickle cell trait Sister     Social History Social History   Tobacco Use  . Smoking status: Never Smoker  . Smokeless tobacco: Never Used  Substance Use Topics  . Alcohol use: No  . Drug use: No     Allergies   Patient has no known allergies.   Review of Systems Review of Systems  Respiratory: Positive for cough.   Musculoskeletal: Positive for myalgias.  All other systems reviewed and are negative.    Physical Exam Triage Vital Signs ED Triage Vitals  Enc Vitals Group     BP      Pulse      Resp      Temp      Temp src  SpO2      Weight      Height      Head Circumference      Peak Flow      Pain Score      Pain Loc      Pain Edu?      Excl. in [place]?    No data found.  Updated Vital Signs BP (!) 227/117 (BP Location: Right Arm)   Pulse (!) 58   Temp 98.1 F (36.7 C) (Oral)   Resp 18   SpO2 97%    Physical Exam Vitals and nursing note reviewed.  Constitutional:      Appearance: Normal appearance. She is obese.  Eyes:     Conjunctiva/sclera: Conjunctivae normal.  Cardiovascular:     Rate and Rhythm: Normal rate and regular rhythm.  Pulmonary:     Effort: Pulmonary effort is normal.     Breath sounds: Normal breath sounds.  Musculoskeletal:        General: Normal range of motion.     Cervical back: Normal range of motion and neck supple.  Skin:    General: Skin is warm and dry.  Neurological:     General: No focal deficit present.     Mental Status: She is alert and oriented to person, place, and time.  Psychiatric:        Mood and Affect: Mood normal.        Behavior: Behavior normal.        Thought Content: Thought content normal.          Judgment: Judgment normal.      UC Treatments / Results  Labs (all labs ordered are listed, but only abnormal results are displayed) Labs Reviewed  SARS CORONAVIRUS 2 (TAT 6-24 HRS)    EKG   Radiology No results found.  Procedures Procedures (including critical care time)  Medications Ordered in UC Medications - No data to display  Initial Impression / Assessment and Plan / UC Course  I have reviewed the triage vital signs and the nursing notes.  Pertinent labs & imaging results that were available during my care of the patient were reviewed by me and considered in my medical decision making (see chart for details).    Final Clinical Impressions(s) / UC Diagnoses   Final diagnoses:  Cough  Essential hypertension     Discharge Instructions     Make sure you stay on your hypertension medication.  Strategies to prevent and/or treat COVID-19:  Vitamin D3 5000 IU (125 mcg) daily Vitamin C 500 mg twice daily Zinc 50 to 75 mg daily  Pepcid 20 mg twice a day  Listerine type mouthwash 4 times a day  COVID-19 infusion hotline:  [phone]     ED Prescriptions    None     I have reviewed the PDMP during this encounter.   Robyn Haber, MD 04/30/19 1028

## 2019-04-30 NOTE — ED Triage Notes (Addendum)
Pt here for cough and sts just found out her mother has covid; pt sts cough x 3 days; pt sts some body aches but denies fever

## 2019-04-30 NOTE — Discharge Instructions (Addendum)
Make sure you stay on your hypertension medication.  Strategies to prevent and/or treat COVID-19:  Vitamin D3 5000 IU (125 mcg) daily Vitamin C 500 mg twice daily Zinc 50 to 75 mg daily  Pepcid 20 mg twice a day  Listerine type mouthwash 4 times a day  COVID-19 infusion hotline:  [phone]

## 2019-05-01 ENCOUNTER — Telehealth (HOSPITAL_COMMUNITY): Payer: Self-pay

## 2019-05-01 LAB — SARS CORONAVIRUS 2 (TAT 6-24 HRS): SARS Coronavirus 2: NEGATIVE

## 2019-05-02 ENCOUNTER — Encounter: Payer: Self-pay | Admitting: Family Medicine

## 2019-05-02 ENCOUNTER — Ambulatory Visit (INDEPENDENT_AMBULATORY_CARE_PROVIDER_SITE_OTHER): Payer: Medicaid Other | Admitting: Family Medicine

## 2019-05-02 ENCOUNTER — Other Ambulatory Visit: Payer: Self-pay

## 2019-05-02 VITALS — BP 138/82 | HR 53 | Wt 259.0 lb

## 2019-05-02 DIAGNOSIS — R319 Hematuria, unspecified: Secondary | ICD-10-CM | POA: Diagnosis not present

## 2019-05-02 DIAGNOSIS — R109 Unspecified abdominal pain: Secondary | ICD-10-CM

## 2019-05-02 DIAGNOSIS — N390 Urinary tract infection, site not specified: Secondary | ICD-10-CM

## 2019-05-02 LAB — POCT URINALYSIS DIP (MANUAL ENTRY)
Bilirubin, UA: NEGATIVE
Blood, UA: NEGATIVE
Glucose, UA: NEGATIVE mg/dL
Ketones, POC UA: NEGATIVE mg/dL
Nitrite, UA: NEGATIVE
Protein Ur, POC: 30 mg/dL — AB
Spec Grav, UA: 1.025 (ref 1.010–1.025)
Urobilinogen, UA: 0.2 E.U./dL
pH, UA: 7 (ref 5.0–8.0)

## 2019-05-02 LAB — POCT URINE PREGNANCY: Preg Test, Ur: NEGATIVE

## 2019-05-02 MED ORDER — CEPHALEXIN 500 MG PO CAPS: 500.0000 mg | ORAL_CAPSULE | Freq: Four times a day (QID) | ORAL | 0 refills | Status: AC

## 2019-05-02 NOTE — Assessment & Plan Note (Signed)
Patient reporting symptoms consistent with previous UTI. UA positive for leukocytes. No CVA tenderness or fevers, so less concern for pyelonephritis. Plan for 7 day course of Keflex as patient found improvement with this in the past. Strict return precautions given. Follow up in 2 weeks if no improvement.

## 2019-05-02 NOTE — Progress Notes (Signed)
   SUBJECTIVE:   CHIEF COMPLAINT / HPI:   Left-sided flank pain: Patient reports that she has had left-sided flank pain that radiates to her back for the past 2 days. Patient reports that the last time she had a UTI she had the same symptoms. She states the pain comes and goes.  She describes the pain as dull and achy.  She denies any dysuria, frequency, urgency, fever, chills.  Patient denies any trauma to her back.  She denies any new exercising routine. LMP 03/21/2019. Neg pregnancy test today.   PERTINENT  PMH / PSH: N/A  OBJECTIVE:  BP 138/82   Pulse (!) 53   Wt 259 lb (117.5 kg)   LMP 03/21/2019   SpO2 99%   Breastfeeding No   BMI 47.37 kg/m   General: NAD, pleasant Neck: Supple, no LAD Respiratory:  normal work of breathing Gastrointestinal: soft, nontender, nondistended, no bilateral CVA tenderness Neuro: CN II-XII grossly intact Psych: AOx3, appropriate affect  ASSESSMENT/PLAN:   UTI (urinary tract infection) Patient reporting symptoms consistent with previous UTI. UA positive for leukocytes. No CVA tenderness or fevers, so less concern for pyelonephritis. Plan for 7 day course of Keflex as patient found improvement with this in the past. Strict return precautions given. Follow up in 2 weeks if no improvement.     [place] Kariya Lavergne, DO PGY-3, [facility]

## 2019-05-02 NOTE — Patient Instructions (Signed)
Thank you for coming to see me today. It was a pleasure! Today we talked about:   You have a UTI and we will treat this with an antibiotic called keflex. Please take this 4 times per day for 7 days. Please call us if your symptoms do not improve. Please be seen by a doctor if you develop fevers, chills, nausea, vomiting, or worsening back pain.   -Avoid others until they meet criteria for ending isolation after any suspected COVID, which are:  -24 hours with no fever (without medications) and  -Respiratory symptoms have resolved (e.g. cough, shortness of breath) and -10 days since symptoms first appeared    Please follow-up as needed.  If you have any questions or concerns, please do not hesitate to call the office at [phone].  Take Care,   [place] Araiya Tilmon, DO

## 2019-05-18 ENCOUNTER — Telehealth: Payer: Self-pay

## 2019-05-18 ENCOUNTER — Other Ambulatory Visit: Payer: Self-pay | Admitting: Family Medicine

## 2019-05-18 MED ORDER — FLUOXETINE HCL 20 MG PO CAPS: 60.0000 mg | ORAL_CAPSULE | Freq: Every day | ORAL | 3 refills | Status: DC

## 2019-05-18 NOTE — Telephone Encounter (Signed)
Patient calls nurse line stating her son was cleaning out her car and threw away a brand new 90day supply of Prozac. Patient stated she just picked prescription up on Monday. I advised patient would come send in 90 tabs again, however insurance may not want to pay. Please send to [facility] and we will go from there.

## 2019-05-18 NOTE — Progress Notes (Signed)
Refilling Rx that pt states was thrown out accidentally

## 2019-05-18 NOTE — Telephone Encounter (Signed)
Refill sent.

## 2019-06-01 ENCOUNTER — Ambulatory Visit (INDEPENDENT_AMBULATORY_CARE_PROVIDER_SITE_OTHER): Payer: Medicaid Other | Admitting: Family Medicine

## 2019-06-01 ENCOUNTER — Other Ambulatory Visit: Payer: Self-pay

## 2019-06-01 VITALS — BP 122/92 | HR 48 | Wt 262.2 lb

## 2019-06-01 DIAGNOSIS — I10 Essential (primary) hypertension: Secondary | ICD-10-CM | POA: Diagnosis present

## 2019-06-01 DIAGNOSIS — F333 Major depressive disorder, recurrent, severe with psychotic symptoms: Secondary | ICD-10-CM | POA: Diagnosis not present

## 2019-06-01 MED ORDER — BUPROPION HCL ER (XL) 150 MG PO TB24: 150.0000 mg | ORAL_TABLET | Freq: Every day | ORAL | 0 refills | Status: DC

## 2019-06-01 NOTE — Patient Instructions (Addendum)
It was great to see you!  Our plans for today:  - We are adding another medication to your regimen. - See below for counseling and psychiatric resources. - Come back in 2 weeks.  Take care and seek immediate care sooner if you develop any concerns.   Dr. [facility]   Therapy and Counseling Resources Most providers on this list will take Medicaid. Patients with commercial insurance or Medicare should contact their insurance company to get a list of in network providers.  [facility]  [address]   [place], [place] [address]      [phone]  [facility] [address]  [place], [place] [address]       [phone]     [facility] [address], [place], [place]  [phone]    [facility] [address], [place], [place] [phone]  [facility]:  [address] [place] [phone]  [facility]:  [address], [place] [phone]  [facility] (under & uninsured) [address]   [place] [phone]    [email or web address]    [facility] -[address]     Phone:  [phone]     [facility]-  [address]  [phone]   [facility] [address]      [place], [place] [phone]  [facility]: [address] [place], [place], [place]  [phone]   [facility] (Spanish therapist) [address] [place]  [address]   [place] [address]    [phone]    [facility]   [address],  [place], [place]  [phone]   [facility]  [address] [place]  [phone]  [facility]  [address] [place], [place]        [phone]  [facility] under & uninsured [place], To schedule an appointment call  [phone]- [phone] [address], [place] [phone]):  [facility] Flow - Fri from 8 AM - 3 PM  [facility] [address],  (Spanish)   [address], [place]: [phone]) 8:30 - 12; 1 - 2:30  [facility] [employer],  [address] [place], [place]    [phone]):8:30 - 12; 2 - 3PM  [facility],  [address]  [place]; [phone]):   Mon - Fri 8 AM - 5 PM  [facility] [address] [place]  MWF 12:30 to 3:00 or call to schedule an appointment  [phone]  Specific Provider options Psychology Today  [email or web address] 1. click on find a therapist  2. enter your zip code 3. left side and select or tailor a therapist for your specific need.   [facility] Provider Directory [email or web address]  (Medicaid)   Follow all drop down to find a provider  Social Support program [facility] [phone] or [employer] [address], [place], [place] Recovery support and educational   In home counseling [facility] Telephone: [phone]  office in [place] [email or web address]   Does not take reg. Medicaid or Medicare private insurance [facility], [place] health Choice, [facility], [place], [place], [place], [place] Health Choice  24- Hour Availability:  . [facility]   [phone] or [phone]  . [facility] [employer] [place]  [phone]   . [place]  [phone] (after hours)  . [facility]/Mobile Crisis   [phone]  . [place] National Suicide Hotline  (936)392-8477 ([place])  . Call 911 or go to emergency room  . [employer]  [phone]);  [place] and [employer]   . [facility]  [phone]); [place], [place], [place], [place], [place], [place], [place]

## 2019-06-01 NOTE — Progress Notes (Signed)
    SUBJECTIVE:   CHIEF COMPLAINT: Depression medications not working  HPI:   Depression - Medications: Prozac 60 mg daily, trazodone 25-50 mg at bedtime as needed - Taking: not taking trazodone - Current stressors: none identified - Coping Mechanisms: none identified - [facility]es [facility] [place] [facility]. Hasn't been in a while, 2 months ago. Also previously seen at [facility], 2 months ago, but has had difficulties getting an appointment. Wants a new psychiatrist. - States she was doing well until last week when she had thoughts someone was going to hurt her.  - also difficulty with sleeping - no SI or HI  PERTINENT  PMH / PSH: Migraine, HTN, morbid obesity, severe depression  OBJECTIVE:   BP (!) 122/92   Pulse (!) 48   Wt 262 lb 4 oz (119 kg)   LMP 05/30/2019   BMI 47.97 kg/m   Gen: morbidly obese, in NAD Psych: pleasant, appropriately dressed and well groomed. No flight of ideas or tangential thought process. Mood and affect congruent.  Depression screen [facility] 2/9 06/01/2019 04/11/2019 04/10/2019  Decreased Interest 2 - 0  Down, Depressed, Hopeless 3 1 0  PHQ - 2 Score 5 1 0  Altered sleeping 3 - -  Tired, decreased energy 3 1 -  Change in appetite 3 - -  Feeling bad or failure about yourself  2 - -  Trouble concentrating 3 1 -  Moving slowly or fidgety/restless 1 - -  Suicidal thoughts 0 - -  PHQ-9 Score 20 - -   GAD 7 : Generalized Anxiety Score 06/01/2019 10/29/2015  Nervous, Anxious, on Edge 2 0  Control/stop worrying 2 0  Worry too much - different things 3 0  Trouble relaxing 3 0  Restless 2 0  Easily annoyed or irritable 2 0  Afraid - awful might happen 1 0  Total GAD 7 Score 15 0      ASSESSMENT/PLAN:   Essential hypertension, benign At goal but with consistently lower heart rate, will decrease propranolol to 40mg  BID. Counseled on possible reflex tachycardia and to notify clinic if symptoms arise.  Major depressive disorder, recurrent  episode, severe, with psychotic behavior (HCC) Recurrent episode with no clear inciting event, PHQ worsened 3>20 today. Previously on abilify after hospitalization 08/2018 for SI but hasn't been on for some time. No current SI. Will add wellbutrin today. Coping mechanisms discussed. Psychiatry resources provided. Suicide precautions discussed.  Follow up in 2 weeks.    09/2018, DO [facility]

## 2019-06-02 MED ORDER — PROPRANOLOL HCL 80 MG PO TABS: 40.0000 mg | ORAL_TABLET | Freq: Two times a day (BID) | ORAL | 5 refills | Status: DC

## 2019-06-02 NOTE — Assessment & Plan Note (Signed)
>>  ASSESSMENT AND PLAN FOR MAJOR DEPRESSIVE DISORDER, RECURRENT EPISODE, SEVERE, WITH PSYCHOTIC BEHAVIOR (HCC) WRITTEN ON 06/02/2019 11:13 AM BY RUMBALL, ALISON, DO  Recurrent episode with no clear inciting event, PHQ worsened 3>20 today. Previously on abilify after hospitalization 08/2018 for SI but hasn't been on for some time. No current SI. Will add wellbutrin today. Coping mechanisms discussed. Psychiatry resources provided. Suicide precautions discussed.  Follow up in 2 weeks.

## 2019-06-02 NOTE — Assessment & Plan Note (Addendum)
Recurrent episode with no clear inciting event, PHQ worsened 3>20 today. Previously on abilify after hospitalization 08/2018 for SI but hasn't been on for some time. No current SI. Will add wellbutrin today. Coping mechanisms discussed. Psychiatry resources provided. Suicide precautions discussed.  Follow up in 2 weeks.

## 2019-06-02 NOTE — Assessment & Plan Note (Signed)
At goal but with consistently lower heart rate, will decrease propranolol to 40mg  BID. Counseled on possible reflex tachycardia and to notify clinic if symptoms arise.

## 2019-06-13 ENCOUNTER — Telehealth: Payer: Self-pay

## 2019-06-13 NOTE — Telephone Encounter (Signed)
Patient calls nurse line stating she has been taking Bupropion since 4/23, and she been experiencing consistent daily headaches ever since. Patient would like to come in ASAP to discuss switching medication. PCP is not available until 5/17, patient does not want to wait that long. Scheduled with Rumball who saw her previously.

## 2019-06-15 ENCOUNTER — Ambulatory Visit: Payer: Medicaid Other | Admitting: Family Medicine

## 2019-06-15 NOTE — Progress Notes (Deleted)
    SUBJECTIVE:   CHIEF COMPLAINT: depression f/u  HPI:   Depression - Medications: Wellbutrin 150 mg daily, Prozac 60 mg daily - Taking: *** - Current stressors: *** - Coping Mechanisms: ***  ***Trazodone?   PERTINENT  PMH / PSH: Migraine, HTN, morbid obesity, severe depression with psychotic features, anemia  OBJECTIVE:   LMP 05/30/2019   ***  ASSESSMENT/PLAN:   No problem-specific Assessment & Plan notes found for this encounter.     Ellwood Dense, DO [facility]

## 2019-06-19 ENCOUNTER — Ambulatory Visit (INDEPENDENT_AMBULATORY_CARE_PROVIDER_SITE_OTHER): Payer: Medicaid Other | Admitting: Family Medicine

## 2019-06-19 ENCOUNTER — Encounter: Payer: Self-pay | Admitting: Family Medicine

## 2019-06-19 ENCOUNTER — Other Ambulatory Visit: Payer: Self-pay

## 2019-06-19 DIAGNOSIS — G44209 Tension-type headache, unspecified, not intractable: Secondary | ICD-10-CM | POA: Insufficient documentation

## 2019-06-19 NOTE — Assessment & Plan Note (Signed)
Assessment: Tension headache located midline occipital region.  Patient does have noted hypertonicity in her trapezius musculature bilaterally.  This headache is been going on for several days and started after a few weeks from using a differential of management, switching from a bed to a recliner due to difficulty fall asleep.  Patient also started Wellbutrin about a month ago but says this has been working really well for her depression.  She came in today to determine if Wellbutrin might be a potential cause of her headache.  Patient with no concerning/red flag symptoms and a normal neurological exam. Plan: -Discussed with patient that the headache she has is consistent with a tension headache but that headaches are a potential side effect of Wellbutrin. -Using shared decision making with patient, she will continue using the Wellbutrin and change her sleeping routine back to the original routine for the next 2 weeks with plan follow-up.  She will also use some conservative measures including stretching, hot showers, and warm washcloth on the back of her neck to help ease the tension and muscular hypertonicity. -Patient plans to return sooner if headache changes or worsens.  If this occurs we will consider discontinuing

## 2019-06-19 NOTE — Progress Notes (Signed)
    SUBJECTIVE:   CHIEF COMPLAINT / HPI:  Headache: - Started last week. Has had a headache since last week that's waxed and waned a bit. 7/10 at worse, 4/10 at it's best. No photophobia. No change in vision, no weakness, fatigue, issues with balance. Pain is located in back of head, in middle, not really on one side or the other. Started wellbutrin last month and thinks it may be related to it. Has had headaches in the past but those were typically migraines and located in front of head unilaterally.  Patient states that about a month ago she changed her sleeping arrangements sleeping in a recliner as she had trouble falling asleep in her normal bed.  She states that this also may be the cause of her headache.  PERTINENT  PMH / PSH: PMH significant for migraine headaches as well as depression.  OBJECTIVE:   BP 130/85   Pulse 60   Temp 98.4 F (36.9 C) (Oral)   Wt 260 lb (117.9 kg)   LMP 05/24/2019   SpO2 98%   BMI 47.55 kg/m    General: NAD, pleasant, able to participate in exam Lungs: No respiratory distress MSK: Some hypertonicity noted of the trapezius bilaterally.  No pain or discomfort on palpation Neuro: CN II through XII intact, no obvious focal deficits, strength 5/5 bilaterally in upper and lower extremities, no photophobia noted on physical exam. Psych: Normal affect and mood  ASSESSMENT/PLAN:   Tension headache Assessment: Tension headache located midline occipital region.  Patient does have noted hypertonicity in her trapezius musculature bilaterally.  This headache is been going on for several days and started after a few weeks from using a differential of management, switching from a bed to a recliner due to difficulty fall asleep.  Patient also started Wellbutrin about a month ago but says this has been working really well for her depression.  She came in today to determine if Wellbutrin might be a potential cause of her headache.  Patient with no concerning/red flag  symptoms and a normal neurological exam. Plan: -Discussed with patient that the headache she has is consistent with a tension headache but that headaches are a potential side effect of Wellbutrin. -Using shared decision making with patient, she will continue using the Wellbutrin and change her sleeping routine back to the original routine for the next 2 weeks with plan follow-up.  She will also use some conservative measures including stretching, hot showers, and warm washcloth on the back of her neck to help ease the tension and muscular hypertonicity. -Patient plans to return sooner if headache changes or worsens.  If this occurs we will consider discontinuing    Jackelyn Poling, DO [facility]

## 2019-06-19 NOTE — Patient Instructions (Addendum)
It was great to see you!  Our plans for today:  -I would like to try some conservative measures for the next 2 weeks including changing your sleeping arrangements back to the way they were prior to these headaches, you can also perform some stretches for your neck and upper back muscles like we discussed today.  Other things you can try are a hot shower or bath followed by stretching the muscles of your neck and upper back, you can also use a warm washcloth against the back of your neck.   -Your headache is very consistent with a tension headache and started after changing your sleeping arrangements but I cannot rule out that the Wellbutrin is not part of the cause.  - I would like to see you back in 2 weeks to reevaluate if your headache is still present.  If your headache goes away between now and then we do not have to see him back in 2 weeks.  If your headache gets worse between now and then I would like to see him back sooner.  Take care and seek immediate care sooner if you develop any concerns.   Dr. [facility]     Tension Headache, Adult A tension headache is a feeling of pain, pressure, or aching in the head that is often felt over the front and sides of the head. The pain can be dull, or it can feel tight (constricting). There are two types of tension headache:  Episodic tension headache. This is when the headaches happen fewer than 15 days a month.  Chronic tension headache. This is when the headaches happen more than 15 days a month during a 49-month period. A tension headache can last from 30 minutes to several days. It is the most common kind of headache. Tension headaches are not normally associated with nausea or vomiting, and they do not get worse with physical activity. What are the causes? The exact cause of this condition is not known. Tension headaches are often triggered by stress, anxiety, or depression. Other triggers include:  Alcohol.  Too much  caffeine or caffeine withdrawal.  Respiratory infections, such as colds, flu, or sinus infections.  Dental problems or teeth clenching.  Tiredness (fatigue).  Holding your head and neck in the same position for a long period of time, such as while using a computer.  Smoking.  Arthritis of the neck. What are the signs or symptoms? Symptoms of this condition include:  A feeling of pressure or tightness around the head.  Dull, aching head pain.  Pain over the front and sides of the head.  Tenderness in the muscles of the head, neck, and shoulders. How is this diagnosed? This condition may be diagnosed based on your symptoms, your medical history, and a physical exam. If your symptoms are severe or unusual, you may have imaging tests, such as a CT scan or an MRI of your head. Your vision may also be checked. How is this treated? This condition may be treated with lifestyle changes and with medicines that help relieve symptoms. Follow these instructions at home: Managing pain  Take over-the-counter and prescription medicines only as told by your health care provider.  When you have a headache, lie down in a dark, quiet room.  If directed, apply ice to the head and neck: ? Put ice in a plastic bag. ? Place a towel between your skin and the bag. ? Leave the ice on for 20 minutes, 2-3 times a  day.  If directed, apply heat to the back of your neck as often as told by your health care provider. Use the heat source that your health care provider recommends, such as a moist heat pack or a heating pad. ? Place a towel between your skin and the heat source. ? Leave the heat on for 20-30 minutes. ? Remove the heat if your skin turns bright red. This is especially important if you are unable to feel pain, heat, or cold. You may have a greater risk of getting burned. Eating and drinking  Eat meals on a regular schedule.  Limit alcohol intake to no more than 1 drink a day for nonpregnant  women and 2 drinks a day for men. One drink equals 12 oz of beer, 5 oz of wine, or 1 oz of hard liquor.  Drink enough fluid to keep your urine pale yellow.  Decrease your caffeine intake, or stop using caffeine. Lifestyle  Get 7-9 hours of sleep each night, or get the amount of sleep recommended by your health care provider.  At bedtime, remove all electronic devices from your room. Electronic devices include computers, phones, and tablets.  Find ways to manage your stress. Some things that can help relieve stress include: ? Exercise. ? Deep breathing exercises. ? Yoga. ? Listening to music. ? Positive mental imagery.  Try to sit up straight and avoid tensing your muscles.  Do not use any products that contain nicotine or tobacco, such as cigarettes and e-cigarettes. If you need help quitting, ask your health care provider. General instructions   Keep all follow-up visits as told by your health care provider. This is important.  Avoid any headache triggers. Keep a headache journal to help find out what may trigger your headaches. For example, write down: ? What you eat and drink. ? How much sleep you get. ? Any change to your diet or medicines. Contact a health care provider if:  Your headache does not get better.  Your headache comes back.  You are sensitive to sounds, light, or smells because of a headache.  You have nausea or you vomit.  Your stomach hurts. Get help right away if:  You suddenly develop a very severe headache along with any of the following: ? A stiff neck. ? Nausea and vomiting. ? Confusion. ? Weakness. ? Double vision or loss of vision. ? Shortness of breath. ? Rash. ? Unusual sleepiness. ? Fever. ? Trouble speaking. ? Pain in your eyes or ears. ? Trouble walking or balancing. ? Feeling faint or passing out. Summary  A tension headache is a feeling of pain, pressure, or aching in the head that is often felt over the front and sides of  the head.  A tension headache can last from 30 minutes to several days. It is the most common kind of headache.  This condition may be diagnosed based on your symptoms, your medical history, and a physical exam.  This condition may be treated with lifestyle changes and with medicines that help relieve symptoms. This information is not intended to replace advice given to you by your health care provider. Make sure you discuss any questions you have with your health care provider. Document Revised: 11/22/2018 Document Reviewed: 05/07/2016 Elsevier Patient Education  [place].

## 2019-06-25 ENCOUNTER — Emergency Department (HOSPITAL_COMMUNITY)
Admission: EM | Admit: 2019-06-25 | Discharge: 2019-06-25 | Disposition: A | Discharge status: Home / Self Care | Payer: Medicaid Other | Attending: Emergency Medicine | Admitting: Emergency Medicine

## 2019-06-25 ENCOUNTER — Emergency Department (HOSPITAL_COMMUNITY): Payer: Medicaid Other

## 2019-06-25 ENCOUNTER — Encounter (HOSPITAL_COMMUNITY): Payer: Self-pay

## 2019-06-25 ENCOUNTER — Other Ambulatory Visit: Payer: Self-pay

## 2019-06-25 DIAGNOSIS — I10 Essential (primary) hypertension: Secondary | ICD-10-CM

## 2019-06-25 DIAGNOSIS — R072 Precordial pain: Secondary | ICD-10-CM

## 2019-06-25 DIAGNOSIS — Z79899 Other long term (current) drug therapy: Secondary | ICD-10-CM | POA: Insufficient documentation

## 2019-06-25 DIAGNOSIS — R079 Chest pain, unspecified: Secondary | ICD-10-CM | POA: Diagnosis present

## 2019-06-25 LAB — BASIC METABOLIC PANEL
Anion gap: 9 (ref 5–15)
BUN: 8 mg/dL (ref 6–20)
CO2: 25 mmol/L (ref 22–32)
Calcium: 8.7 mg/dL — ABNORMAL LOW (ref 8.9–10.3)
Chloride: 104 mmol/L (ref 98–111)
Creatinine, Ser: 0.77 mg/dL (ref 0.44–1.00)
GFR calc Af Amer: >60 mL/min (ref >60)
GFR calc non Af Amer: >60 mL/min (ref >60)
Glucose, Bld: 147 mg/dL — ABNORMAL HIGH (ref 70–99)
Potassium: 4.3 mmol/L (ref 3.5–5.1)
Sodium: 138 mmol/L (ref 135–145)

## 2019-06-25 LAB — CBC
HCT: 41.5 % (ref 36.0–46.0)
Hemoglobin: 12.5 g/dL (ref 12.0–15.0)
MCH: 25.8 pg — ABNORMAL LOW (ref 26.0–34.0)
MCHC: 30.1 g/dL (ref 30.0–36.0)
MCV: 85.7 fL (ref 80.0–100.0)
Platelets: 306 10*3/uL (ref 150–400)
RBC: 4.84 MIL/uL (ref 3.87–5.11)
RDW: 15 % (ref 11.5–15.5)
WBC: 7.4 10*3/uL (ref 4.0–10.5)
nRBC: 0 % (ref 0.0–0.2)

## 2019-06-25 LAB — TROPONIN I (HIGH SENSITIVITY)
Troponin I (High Sensitivity): 4 ng/L (ref <18)
Troponin I (High Sensitivity): 5 ng/L (ref <18)

## 2019-06-25 LAB — I-STAT BETA HCG BLOOD, ED (MC, WL, AP ONLY): I-stat hCG, quantitative: <5 m[IU]/mL (ref <5)

## 2019-06-25 MED ORDER — PROPRANOLOL HCL 40 MG PO TABS
40.0000 mg | ORAL_TABLET | Freq: Once | ORAL | Status: AC
  Administered 2019-06-25: 40 mg via ORAL
  Filled 2019-06-25: qty 1

## 2019-06-25 NOTE — Discharge Instructions (Addendum)
It was our pleasure to provide your ER care today - we hope that you feel better.  Take your blood pressure medication as prescribed.  Limit salt intake. Follow up with your primary care doctor in the coming week for recheck.  For chest discomfort, follow up with cardiologist in 1 week - call office to arrange appointment.   Return to ER if worse, new symptoms, persistent or recurrent chest pain, trouble breathing, weak/fainting, severe headache, or other concern.

## 2019-06-25 NOTE — ED Provider Notes (Signed)
[facility] EMERGENCY DEPARTMENT Provider Note   CSN: [id number] Arrival date & time: 06/25/19  1350     History Chief Complaint  Patient presents with  . Chest Pain  . Hypertension    Kendra Harris is a 39 y.o. female.  Patient c/o mid chest pain, and elevated blood pressure. Notes hx htn, takes inderal - states had not yet taken today, but has adequate supply at home. Also mid chest pain at rest, since late morning today. Symptoms acute onset, dull to sharp, moderate, persistent, constant, non radiating, at rest. No relation to activity or exertion. Not pleuritic. No associated sob, nv or diaphoresis. No leg pain or swelling. No hx dvt or pe. No recent surgery, immobility or trauma. Denies cough or uri symptoms. No fever or chills. No chest wall injury or strain. No heartburn. Denies headache. No change in speech or vision. No numbness/weakness. No change in normal functional ability.   The history is provided by the patient.  Chest Pain Associated symptoms: no abdominal pain, no back pain, no cough, no fever, no headache, no numbness, no shortness of breath, no vomiting and no weakness   Hypertension Associated symptoms include chest pain. Pertinent negatives include no abdominal pain, no headaches and no shortness of breath.       Past Medical History:  Diagnosis Date  . DELAYED MENSES 02/11/2010   Qualifier: Diagnosis of  By: Wallene Huh  MD, Rande Lawman    . Gestational diabetes   . Hypertension   . Urinary tract infection 03/04/2011    Patient Active Problem List   Diagnosis Date Noted  . Tension headache 06/19/2019  . Anemia 04/11/2019  . Major depressive disorder, recurrent episode, severe, with psychotic behavior (HCC) 08/14/2018  . Cervical cancer screening 08/09/2018  . UTI (urinary tract infection) 07/27/2013  . Menorrhagia 04/27/2013  . Severe major depression with psychotic features (HCC) 07/24/2010  . MIGRAINE HEADACHE 12/13/2007  . Essential  hypertension, benign 08/31/2007  . Morbid obesity with BMI of 45.0-49.9, adult (HCC) 04/07/2006    Past Surgical History:  Procedure Laterality Date  . NO PAST SURGERIES       OB History    Gravida  5   Para  5   Term  5   Preterm  0   AB  0   Living  5     SAB  0   TAB  0   Ectopic  0   Multiple  0   Live Births  5           Family History  Problem Relation Age of Onset  . Sickle cell trait Sister     Social History   Tobacco Use  . Smoking status: Never Smoker  . Smokeless tobacco: Never Used  Substance Use Topics  . Alcohol use: No  . Drug use: No    Home Medications Prior to Admission medications   Medication Sig Start Date End Date Taking? Authorizing Provider  buPROPion (WELLBUTRIN XL) 150 MG 24 hr tablet Take 1 tablet (150 mg total) by mouth daily. 06/01/19  Yes Ellwood Dense, DO  FLUoxetine (PROZAC) 20 MG capsule Take 3 capsules (60 mg total) by mouth daily. 05/18/19  Yes Sandre Kitty, MD  propranolol (INDERAL) 80 MG tablet Take 0.5 tablets (40 mg total) by mouth 2 (two) times daily. 06/02/19  Yes Ellwood Dense, DO  Prenatal Vit-Fe Fumarate-FA (PRENATAL MULTIVITAMIN) TABS tablet Take 1 tablet by mouth daily. Fill as [employer] is  covered Patient not taking: Reported on 06/25/2019 08/21/18   Malvin Johns, MD  traZODone (DESYREL) 50 MG tablet Take 0.5-1 tablets (25-50 mg total) by mouth at bedtime as needed for sleep. Patient not taking: Reported on 06/25/2019 03/06/19   Mirian Mo, MD    Allergies    Patient has no known allergies.  Review of Systems   Review of Systems  Constitutional: Negative for chills and fever.  HENT: Negative for sore throat.   Eyes: Negative for visual disturbance.  Respiratory: Negative for cough and shortness of breath.   Cardiovascular: Positive for chest pain. Negative for leg swelling.  Gastrointestinal: Negative for abdominal pain, diarrhea and vomiting.  Genitourinary: Negative for flank pain.   Musculoskeletal: Negative for back pain and neck pain.  Skin: Negative for rash.  Neurological: Negative for speech difficulty, weakness, numbness and headaches.  Hematological: Does not bruise/bleed easily.  Psychiatric/Behavioral: Negative for confusion.    Physical Exam Updated Vital Signs BP (!) 210/112 (BP Location: Left Arm)   Pulse 62   Temp 99 F (37.2 C) (Oral)   Resp 18   LMP 05/30/2019   SpO2 99%   Physical Exam Vitals and nursing note reviewed.  Constitutional:      Appearance: Normal appearance. She is well-developed.  HENT:     Head: Atraumatic.     Nose: Nose normal.     Mouth/Throat:     Mouth: Mucous membranes are moist.  Eyes:     General: No scleral icterus.    Conjunctiva/sclera: Conjunctivae normal.     Pupils: Pupils are equal, round, and reactive to light.  Neck:     Vascular: No carotid bruit.     Trachea: No tracheal deviation.     Comments: Thyroid not grossly enlarged or tender.  Cardiovascular:     Rate and Rhythm: Normal rate and regular rhythm.     Pulses: Normal pulses.     Heart sounds: Normal heart sounds. No murmur. No friction rub. No gallop.   Pulmonary:     Effort: Pulmonary effort is normal. No respiratory distress.     Breath sounds: Normal breath sounds.  Abdominal:     General: Bowel sounds are normal. There is no distension.     Palpations: Abdomen is soft. There is no mass.     Tenderness: There is no abdominal tenderness. There is no guarding or rebound.     Hernia: No hernia is present.     Comments: No bruits.   Genitourinary:    Comments: No cva tenderness.  Musculoskeletal:        General: No swelling or tenderness.     Cervical back: Normal range of motion and neck supple. No rigidity. No muscular tenderness.     Right lower leg: No edema.     Left lower leg: No edema.  Skin:    General: Skin is warm and dry.     Findings: No rash.  Neurological:     Mental Status: She is alert.     Comments: Alert, speech  normal. Motor/sens grossly intact bil. Steady gait.   Psychiatric:        Mood and Affect: Mood normal.     ED Results / Procedures / Treatments   Labs (all labs ordered are listed, but only abnormal results are displayed) Results for orders placed or performed during the hospital encounter of 06/25/19  Basic metabolic panel  Result Value Ref Range   Sodium 138 135 - 145 mmol/L   Potassium  4.3 3.5 - 5.1 mmol/L   Chloride 104 98 - 111 mmol/L   CO2 25 22 - 32 mmol/L   Glucose, Bld 147 (H) 70 - 99 mg/dL   BUN 8 6 - 20 mg/dL   Creatinine, Ser 2.77 0.44 - 1.00 mg/dL   Calcium 8.7 (L) 8.9 - 10.3 mg/dL   GFR calc non Af Amer >60 >60 mL/min   GFR calc Af Amer >60 >60 mL/min   Anion gap 9 5 - 15  CBC  Result Value Ref Range   WBC 7.4 4.0 - 10.5 K/uL   RBC 4.84 3.87 - 5.11 MIL/uL   Hemoglobin 12.5 12.0 - 15.0 g/dL   HCT 41.2 87.8 - 67.6 %   MCV 85.7 80.0 - 100.0 fL   MCH 25.8 (L) 26.0 - 34.0 pg   MCHC 30.1 30.0 - 36.0 g/dL   RDW 72.0 94.7 - 09.6 %   Platelets 306 150 - 400 K/uL   nRBC 0.0 0.0 - 0.2 %  I-Stat beta hCG blood, ED  Result Value Ref Range   I-stat hCG, quantitative <5.0 <5 mIU/mL   Comment 3          Troponin I (High Sensitivity)  Result Value Ref Range   Troponin I (High Sensitivity) 4 <18 ng/L  Troponin I (High Sensitivity)  Result Value Ref Range   Troponin I (High Sensitivity) 5 <18 ng/L   DG Chest 2 View  Result Date: 06/25/2019 CLINICAL DATA:  Awoke from sleep with chest pain. EXAM: CHEST - 2 VIEW COMPARISON:  Remote radiograph 04/14/2008 FINDINGS: The cardiomediastinal contours are normal. Minor subsegmental bibasilar atelectasis. Pulmonary vasculature is normal. No consolidation, pleural effusion, or pneumothorax. No acute osseous abnormalities are seen. IMPRESSION: Minor subsegmental bibasilar atelectasis. Electronically Signed   By: Narda Rutherford M.D.   On: 06/25/2019 14:35    EKG EKG Interpretation  Date/Time:  Monday Jun 25 2019 14:15:35 EDT  Ventricular Rate:  66 PR Interval:  196 QRS Duration: 86 QT Interval:  414 QTC Calculation: 434 R Axis:   9 Text Interpretation: Normal sinus rhythm Non-specific ST-t changes Confirmed by Cathren Laine ([id number]) on 06/25/2019 9:24:48 PM   Radiology DG Chest 2 View  Result Date: 06/25/2019 CLINICAL DATA:  Awoke from sleep with chest pain. EXAM: CHEST - 2 VIEW COMPARISON:  Remote radiograph 04/14/2008 FINDINGS: The cardiomediastinal contours are normal. Minor subsegmental bibasilar atelectasis. Pulmonary vasculature is normal. No consolidation, pleural effusion, or pneumothorax. No acute osseous abnormalities are seen. IMPRESSION: Minor subsegmental bibasilar atelectasis. Electronically Signed   By: Narda Rutherford M.D.   On: 06/25/2019 14:35    Procedures Procedures (including critical care time)  Medications Ordered in ED Medications  propranolol (INDERAL) tablet 40 mg (has no administration in time range)    ED Course  I have reviewed the triage vital signs and the nursing notes.  Pertinent labs & imaging results that were available during my care of the patient were reviewed by me and considered in my medical decision making (see chart for details).    MDM Rules/Calculators/A&P                      Iv ns. Stat labs. Pcxr. Ecg.   Reviewed nursing notes and prior charts for additional history.   Initial labs reviewed/interpereted by me - trop normal.  cxr reviewed/interpreted by me - no pna.   Recheck pt elev. Pt has not yet had her meds today, states takes inderal 40 mg bid.  Dose provided in ED. Await delta trop.  Acetaminophen po. Po fluids.   Additional labs reviewed/interpreted by me - delta trop normal.   Recheck pt, no cp, no sob. No headache.   Pt currently appears stable for d/c.   rec pcp f/u.  Return precautions provided.      Final Clinical Impression(s) / ED Diagnoses Final diagnoses:  None    Rx / DC Orders ED Discharge Orders    None        Lajean Saver, MD 06/25/19 2223

## 2019-06-25 NOTE — ED Triage Notes (Signed)
Pt reports chest pain that woke her out of her sleep around 1230pm today, pain radiates to her upper back. Pt also had numbness in her left hand that has now resolved. Pt given 324 ASA and 2 Nitro en route with relief of chest pain. Pt hypertensive 250/150 , 195/135, states she did not take her Bp meds this morning. Pt a.o, nad noted at this time. 20G RAC

## 2019-06-28 ENCOUNTER — Ambulatory Visit (INDEPENDENT_AMBULATORY_CARE_PROVIDER_SITE_OTHER): Payer: Medicaid Other | Admitting: Family Medicine

## 2019-06-28 ENCOUNTER — Encounter: Payer: Self-pay | Admitting: Family Medicine

## 2019-06-28 ENCOUNTER — Other Ambulatory Visit: Payer: Self-pay

## 2019-06-28 DIAGNOSIS — M542 Cervicalgia: Secondary | ICD-10-CM | POA: Diagnosis not present

## 2019-06-28 MED ORDER — DICLOFENAC SODIUM 1 % EX GEL: 4.0000 g | Freq: Four times a day (QID) | CUTANEOUS | 1 refills | Status: DC

## 2019-06-28 MED ORDER — CYCLOBENZAPRINE HCL 10 MG PO TABS
10.0000 mg | ORAL_TABLET | Freq: Three times a day (TID) | ORAL | 0 refills | Status: DC | PRN
Start: 2019-06-28 — End: 2019-08-20

## 2019-06-28 NOTE — Patient Instructions (Addendum)
Thank you for coming to see me today. It was a pleasure! Today we talked about:   The back of your neck and on your shoulders are quite tight which could be causing your headache.  I recommend that you continue trying the heating pad.  I have sent in a muscle relaxer to your pharmacy called Flexeril.  Please do not take this prior to driving a motorized vehicle.  It may be best used before bed at night.  I also recommend using Voltaren gel to have this massaged into your neck and shoulders.  Having someone at home to massage your neck and shoulders may also help.  It would be essential for you to continue doing stretches regularly of your neck.  I also encourage you to continue to try to work on weight loss and being more active.  Please follow-up with your regular physician in 4 to 6 weeks or sooner if your headaches worsen or continue without any improvement.  Do not hesitate to go back to the emergency room if you had issues with vision change, chest pain or shortness of breath.  If you have any questions or concerns, please do not hesitate to call the office at [phone].  Take Care,   [place] Aarionna Germer, DO

## 2019-06-28 NOTE — Progress Notes (Signed)
   SUBJECTIVE:   CHIEF COMPLAINT / HPI:   Pain in back of neck: Patient reports that she is still having tightness and tension in the back of her head and neck. She states that it feels tight and there is a sharp pain. She says has been going on for 2 weeks. She has tried Tylenol, aspirin and ibuprofen without much benefit. She also states she has been using a heating pad and taking hot showers at night. She denies any vision changes, dizziness, weakness, numbness or tingling. She reports that she is on her phone a lot during the day causing her to have poor posture and looking down. She does have a history of migraines but states this feels different.  PERTINENT  PMH / PSH: No significant past medical history  OBJECTIVE:  BP 122/80   Pulse 60   Ht 5\' 2"  (1.575 m)   Wt 255 lb (115.7 kg)   LMP 06/18/2019   SpO2 98%   BMI 46.64 kg/m   General: NAD, pleasant Neck: Supple, tight muscles noted on bilateral neck along paraspinals as well as tight trapezius muscles bilaterally Respiratory:  normal work of breathing Psych: AOx3, appropriate affect  ASSESSMENT/PLAN:   Cervicalgia Patient with headache and neck pain with tight muscles noted on exam. Patient encouraged to continue with heating pad and hot showers. Given exercises and stretches to do regularly. Patient encouraged to improve posture and decreased using her phone in her lap. Patient also given Voltaren gel to have massaged into her shoulders. Will provide with Flexeril to use nightly before bed. Patient to return in 4 weeks if symptoms continue or do not improve.    08/18/2019 Kendra Hypolite, DO PGY-3, [place] [facility]

## 2019-07-01 DIAGNOSIS — M542 Cervicalgia: Secondary | ICD-10-CM | POA: Insufficient documentation

## 2019-07-01 NOTE — Assessment & Plan Note (Signed)
Patient with headache and neck pain with tight muscles noted on exam. Patient encouraged to continue with heating pad and hot showers. Given exercises and stretches to do regularly. Patient encouraged to improve posture and decreased using her phone in her lap. Patient also given Voltaren gel to have massaged into her shoulders. Will provide with Flexeril to use nightly before bed. Patient to return in 4 weeks if symptoms continue or do not improve.

## 2019-08-16 ENCOUNTER — Other Ambulatory Visit: Payer: Self-pay

## 2019-08-16 ENCOUNTER — Ambulatory Visit (INDEPENDENT_AMBULATORY_CARE_PROVIDER_SITE_OTHER): Payer: Medicaid Other | Admitting: Family Medicine

## 2019-08-16 ENCOUNTER — Encounter: Payer: Self-pay | Admitting: Family Medicine

## 2019-08-16 VITALS — BP 180/112 | HR 54 | Ht 62.0 in | Wt 267.0 lb

## 2019-08-16 DIAGNOSIS — R030 Elevated blood-pressure reading, without diagnosis of hypertension: Secondary | ICD-10-CM | POA: Diagnosis not present

## 2019-08-16 DIAGNOSIS — I10 Essential (primary) hypertension: Secondary | ICD-10-CM

## 2019-08-16 DIAGNOSIS — K047 Periapical abscess without sinus: Secondary | ICD-10-CM | POA: Diagnosis not present

## 2019-08-16 MED ORDER — AMOXICILLIN 875 MG PO TABS: 875.0000 mg | ORAL_TABLET | Freq: Two times a day (BID) | ORAL | 0 refills | Status: AC

## 2019-08-16 MED ORDER — IBUPROFEN 600 MG PO TABS: 600.0000 mg | ORAL_TABLET | Freq: Four times a day (QID) | ORAL | 1 refills | Status: DC | PRN

## 2019-08-16 MED ORDER — AMLODIPINE BESYLATE 5 MG PO TABS: 5.0000 mg | ORAL_TABLET | Freq: Every day | ORAL | 3 refills | Status: DC

## 2019-08-16 NOTE — Patient Instructions (Signed)
I believe you have a tooth infection. Start the Amoxicillin one tablet twice a day for 10 days.  You should start to feel some pain reflief within about a day of starting this antibiotic.  Start the Ibuprofen 600 mg tablet, one tablet every four hours if you need it for the pain Keep using the ice for some pain relief.   Contact a dentist for evaluation and treatment of the dental infection.  The pain will come back soon after stopping the antibiotics.   Please start the Amlodipine for your high blood pressure. Take one 5 mg tablet daily.   Please to follow up in one month to see how well your blood pressure is controlled with this medication dose.     Dental Abscess  A dental abscess is an area of pus in or around a tooth. It comes from an infection. It can cause pain and other symptoms. Treatment will help with symptoms and prevent the infection from spreading. Follow these instructions at home: Medicines  Take over-the-counter and prescription medicines only as told by your dentist.  If you were prescribed an antibiotic medicine, take it as told by your dentist. Do not stop taking it even if you start to feel better.  If you were prescribed a gel that has numbing medicine in it, use it exactly as told.  Do not drive or use heavy machinery (like a Surveyor, mining) while taking prescription pain medicine. General instructions  Rinse out your mouth often with salt water. ? To make salt water, dissolve -1 tsp of salt in 1 cup of warm water.  Eat a soft diet while your mouth is healing.  Drink enough fluid to keep your urine pale yellow.  Do not apply heat to the outside of your mouth.  Do not use any products that contain nicotine or tobacco. These include cigarettes and e-cigarettes. If you need help quitting, ask your doctor.  Keep all follow-up visits as told by your dentist. This is important. Prevent an abscess  Brush your teeth every morning and every night. Use fluoride  toothpaste.  Floss your teeth each day.  Get dental cleanings as often as told by your dentist.  Think about getting dental sealant put on teeth that have deep holes (decay).  Drink water that has fluoride in it. ? Most tap water has fluoride. ? Check the label on bottled water to see if it has fluoride in it.  Drink water instead of sugary drinks.  Eat healthy meals and snacks.  Wear a mouth guard or face shield when you play sports. Contact a doctor if:  Your pain is worse, and medicine does not help. Get help right away if:  You have a fever or chills.  Your symptoms suddenly get worse.  You have a very bad headache.  You have problems breathing or swallowing.  You have trouble opening your mouth.  You have swelling in your neck or close to your eye. Summary  A dental abscess is an area of pus in or around a tooth. It is caused by an infection.  Treatment will help with symptoms and prevent the infection from spreading.  Take over-the-counter and prescription medicines only as told by your dentist.  To prevent an abscess, take good care of your teeth. Brush your teeth every morning and night. Use floss every day.  Get dental cleanings as often as told by your dentist. This information is not intended to replace advice given to you by your  health care provider. Make sure you discuss any questions you have with your health care provider. Document Revised: 05/17/2018 Document Reviewed: 09/27/2016 Elsevier Patient Education  2020 [employer].

## 2019-08-20 ENCOUNTER — Ambulatory Visit (INDEPENDENT_AMBULATORY_CARE_PROVIDER_SITE_OTHER): Payer: Medicaid Other | Admitting: Family Medicine

## 2019-08-20 ENCOUNTER — Other Ambulatory Visit: Payer: Self-pay

## 2019-08-20 DIAGNOSIS — K047 Periapical abscess without sinus: Secondary | ICD-10-CM | POA: Diagnosis not present

## 2019-08-20 DIAGNOSIS — R030 Elevated blood-pressure reading, without diagnosis of hypertension: Secondary | ICD-10-CM | POA: Insufficient documentation

## 2019-08-20 DIAGNOSIS — G44209 Tension-type headache, unspecified, not intractable: Secondary | ICD-10-CM

## 2019-08-20 MED ORDER — CYCLOBENZAPRINE HCL 10 MG PO TABS: 10.0000 mg | ORAL_TABLET | Freq: Three times a day (TID) | ORAL | 0 refills | Status: DC | PRN

## 2019-08-20 NOTE — Progress Notes (Addendum)
SUBJECTIVE:   CHIEF COMPLAINT / HPI:   Headache Patient is a 39 year old female presenting with a headache that she quotes moves into her neck and shoulders. Started last week, one day before tooth pain started. Back of head feeling like a "dull pain" or a tightness". Radiates forward on head bilaterally. 6/10 currently, has been 9/10.  Tooth pain Started last week 1 day after her headache started. Had a tooth broken last year and had pain start around same tooth last week.  Tooth is located at the bottom of mouth on right side. Has had some sore throat but no fevers. 4/10 aching pain. Came in last week and got antibiotics which did improve the pain.  Currently trying to get in with a dentist to get the tooth extracted.  Plans to call today to see if she can get an appointment.   PERTINENT  PMH / PSH: Migraines and previous tension headaches  OBJECTIVE:   BP (!) 145/82    Pulse 99    Ht 5\' 2"  (1.575 m)    Wt 263 lb 12.8 oz (119.7 kg)    LMP 07/10/2019 (Approximate)    SpO2 95%    BMI 48.25 kg/m    General: NAD, pleasant, able to participate in exam HEENT: Right lower molar with obvious sign of decay with no discomfort on palpation to the surrounding lower jaw, no obvious signs of abscess to the surrounding lower jaw or swelling of the mucosa in the inner mouth Cardiac: RRR, no murmurs. Respiratory: CTAB, normal effort Neuro: alert, CN II through XII intact, fine touch sensation intact bilaterally in upper and lower extremities, strength 5/5 bilaterally in upper and lower extremities.  Psych: Normal affect and mood  ASSESSMENT/PLAN:   Tension headache Assessment: Tension type headache with increased hypertonicity of the left trapezius musculature which patient believes is from sleeping in an upright chair.  Patient also has some decay present in a right lower molar which patient states started right after her headache started, which may have been the initiating factor of this  headache.  Normal neurological exam with no red flag symptoms.  Most likely differential includes tension style headache as patient does have hypertonicity of her musculature.  Unlikely infectious etiology as the patient does not have stiffness with her neck, no fevers, no generalized malaise or other symptoms concerning for meningitis or other infection.  Less likely migraine with bilateral nature of the patient's headache.  No recent trauma.  Normal neuro exam so unlikely stroke. Plan: -Patient plans to follow-up with her dentist today to hopefully get the tooth extracted which may provide her some relief from her headache as well -Provided patient with Flexeril to use nightly to help relieve her tension headache until she can see her dentist -Recommend patient continue with ibuprofen as well as acetaminophen for pain with her headache -Return precautions provided  Infected tooth Assessment: Right lower molar with obvious decay and discomfort without obvious signs of abscess or drainage surrounding the tooth.  Patient without any jaw discomfort on palpation.  No fevers.  Patient previously seen a few days ago for this and was prescribed amoxicillin x10 days as well as ibuprofen for pain.  Pain currently 4/10 Plan: -Patient states she plans to call her dentist today to get a follow-up appointment and hopefully get the tooth extracted -Recommend patient continue the amoxicillin as previously prescribed for the duration of 10 days which should finish up on 7/18 -Recommend patient continue with ibuprofen for pain  with her tooth.    Jackelyn Poling, DO [facility]

## 2019-08-20 NOTE — Patient Instructions (Signed)
It was great to see you!  Our plans for today:  -You were seen today for a tension style headache as well as for a infected tooth.  For your infected tooth I want you to follow-up with the dentist as planned calling them today to schedule an appointment, continue your antibiotics as provided during last appointment, continue the ibuprofen as needed for pain. -For your headache I am prescribing Flexeril as a muscle relaxer to help with this.  This is similar to what you received last time.  Please do not take this medication if you plan to be operate any heavy machinery, driving, or going to work as it can make you drowsy.  Do not take this with alcohol.  My hope is that your headache will improve once the tooth is removed. -Should you develop any concerning symptoms such as numbness or weakness in one limb, fevers, or other worsening symptoms or worsening headache please return or go to the emergency department.  Take care and seek immediate care sooner if you develop any concerns.   Dr[facility]

## 2019-08-20 NOTE — Progress Notes (Signed)
Kendra Harris is alone Sources of clinical information for visit is/are patient and past medical records. Nursing assessment for this office visit was reviewed with the patient for accuracy and revision.     Previous Report(s) Reviewed: office notes  Depression screen [facility] 2/9 08/20/2019  Decreased Interest 0  Down, Depressed, Hopeless 0  PHQ - 2 Score 0  Altered sleeping -  Tired, decreased energy -  Change in appetite -  Feeling bad or failure about yourself  -  Trouble concentrating -  Moving slowly or fidgety/restless -  Suicidal thoughts -  PHQ-9 Score -  Some recent data might be hidden    Fall Risk  08/16/2019 06/04/2016 10/30/2015 08/04/2015 07/31/2014  Falls in the past year? 0 No No No No  Number falls in past yr: 0 - - - -  Follow up Falls evaluation completed - - - -    PHQ9 SCORE ONLY 08/20/2019 08/16/2019 06/28/2019  PHQ-9 Total Score 0 0 0    Adult vaccines due  Topic Date Due   TETANUS/TDAP  09/09/2024    Health Maintenance Due  Topic Date Due   Hepatitis C Screening  Never done   COVID-19 Vaccine (1) Never done      History/P.E. limitations: none  Adult vaccines due  Topic Date Due   TETANUS/TDAP  09/09/2024   There are no preventive care reminders to display for this patient.  Health Maintenance Due  Topic Date Due   Hepatitis C Screening  Never done   COVID-19 Vaccine (1) Never done     Chief Complaint  Patient presents with   Dental Pain    Onset: last week Location: right mouth & jaw Quality: aching Severity: moderate Function: pain with eating Pattern: constant with intermittent worsening Course: worsening  Radiation: towards right ear Relief: temporary partial relief with otc ibupr Precipitant: broken molar teeth right side. Has not yet sought Dentistry  Past Medical History:  Diagnosis Date   DELAYED MENSES 02/11/2010   Qualifier: Diagnosis of  By: Wallene Huh  MD, Rande Lawman     Gestational diabetes    Hypertension     Urinary tract infection 03/04/2011  : nonsmooker  PHYSICAL EXAM  Vitals:   08/16/19 1119  Weight: 267 lb (121.1 kg)  Height: 5\' 2"  (1.575 m)    VS reviewed GEN: Alert, Cooperative, Groomed, mild distress HEENT: no palp cx LAN, no mandibular or submandibular enlargement, no tenderness over the right mandible, oral cavity with last two molars having broken off crowns with black bases exposed.  No edema or erythema of surrounding gum/gingiva.  (+) TTP.   Visit Problem List with A/P  Elevated BP without diagnosis of hypertension    Essential hypertension, benign History of intermittent elevated BP and normal BPs at OVs.  Currently in moderate dental pain No clinical evidence of hypertensive emergency. Working explanation is pain induced high blood pressure.  Started Amlodipine 5 mg daily in addition to the propranolol 80 mg BID  Will need follow up in a week, hopefully when pain is under control. Discussed reasons to seek emergent care, e.g. chest pain, numbness.weakness limb, etc..  Infected tooth Start Amoxicillin 875 mg BID Rx Ibuprofen 600 mg q6h prn Set up dental appointment

## 2019-08-20 NOTE — Assessment & Plan Note (Signed)
Assessment: Right lower molar with obvious decay and discomfort without obvious signs of abscess or drainage surrounding the tooth.  Patient without any jaw discomfort on palpation.  No fevers.  Patient previously seen a few days ago for this and was prescribed amoxicillin x10 days as well as ibuprofen for pain.  Pain currently 4/10 Plan: -Patient states she plans to call her dentist today to get a follow-up appointment and hopefully get the tooth extracted -Recommend patient continue the amoxicillin as previously prescribed for the duration of 10 days which should finish up on 7/18 -Recommend patient continue with ibuprofen for pain with her tooth.

## 2019-08-20 NOTE — Assessment & Plan Note (Addendum)
History of intermittent elevated BP and normal BPs at OVs.  Currently in moderate dental pain No clinical evidence of hypertensive emergency. Working explanation is pain induced high blood pressure.  Started Amlodipine 5 mg daily in addition to the propranolol 80 mg BID  Will need follow up in a week, hopefully when pain is under control. Discussed reasons to seek emergent care, e.g. chest pain, numbness.weakness limb, etc..

## 2019-08-20 NOTE — Assessment & Plan Note (Signed)
Start Amoxicillin 875 mg BID Rx Ibuprofen 600 mg q6h prn Set up dental appointment

## 2019-08-20 NOTE — Assessment & Plan Note (Addendum)
Assessment: Tension type headache with increased hypertonicity of the left trapezius musculature which patient believes is from sleeping in an upright chair.  Patient also has some decay present in a right lower molar which patient states started right after her headache started, which may have been the initiating factor of this headache.  Normal neurological exam with no red flag symptoms.  Most likely differential includes tension style headache as patient does have hypertonicity of her musculature.  Unlikely infectious etiology as the patient does not have stiffness with her neck, no fevers, no generalized malaise or other symptoms concerning for meningitis or other infection.  Less likely migraine with bilateral nature of the patient's headache.  No recent trauma.  Normal neuro exam so unlikely stroke. Plan: -Patient plans to follow-up with her dentist today to hopefully get the tooth extracted which may provide her some relief from her headache as well -Provided patient with Flexeril to use nightly to help relieve her tension headache until she can see her dentist -Recommend patient continue with ibuprofen as well as acetaminophen for pain with her headache -Return precautions provided

## 2019-08-21 ENCOUNTER — Emergency Department (HOSPITAL_COMMUNITY)
Admission: EM | Admit: 2019-08-21 | Discharge: 2019-08-21 | Disposition: A | Discharge status: Home / Self Care | Payer: Medicaid Other | Attending: Emergency Medicine | Admitting: Emergency Medicine

## 2019-08-21 ENCOUNTER — Encounter (HOSPITAL_COMMUNITY): Payer: Self-pay | Admitting: Emergency Medicine

## 2019-08-21 ENCOUNTER — Other Ambulatory Visit: Payer: Self-pay

## 2019-08-21 DIAGNOSIS — Z79899 Other long term (current) drug therapy: Secondary | ICD-10-CM | POA: Insufficient documentation

## 2019-08-21 DIAGNOSIS — M542 Cervicalgia: Secondary | ICD-10-CM | POA: Insufficient documentation

## 2019-08-21 DIAGNOSIS — R590 Localized enlarged lymph nodes: Secondary | ICD-10-CM | POA: Insufficient documentation

## 2019-08-21 DIAGNOSIS — R519 Headache, unspecified: Secondary | ICD-10-CM

## 2019-08-21 DIAGNOSIS — I10 Essential (primary) hypertension: Secondary | ICD-10-CM | POA: Diagnosis not present

## 2019-08-21 DIAGNOSIS — R6884 Jaw pain: Secondary | ICD-10-CM | POA: Diagnosis not present

## 2019-08-21 DIAGNOSIS — R42 Dizziness and giddiness: Secondary | ICD-10-CM | POA: Insufficient documentation

## 2019-08-21 LAB — CBC WITH DIFFERENTIAL/PLATELET
Abs Immature Granulocytes: 0.03 10*3/uL (ref 0.00–0.07)
Basophils Absolute: 0 10*3/uL (ref 0.0–0.1)
Basophils Relative: 0 %
Eosinophils Absolute: 0.1 10*3/uL (ref 0.0–0.5)
Eosinophils Relative: 1 %
HCT: 39.3 % (ref 36.0–46.0)
Hemoglobin: 11.9 g/dL — ABNORMAL LOW (ref 12.0–15.0)
Immature Granulocytes: 0 %
Lymphocytes Relative: 13 %
Lymphs Abs: 1.4 10*3/uL (ref 0.7–4.0)
MCH: 25.7 pg — ABNORMAL LOW (ref 26.0–34.0)
MCHC: 30.3 g/dL (ref 30.0–36.0)
MCV: 84.9 fL (ref 80.0–100.0)
Monocytes Absolute: 1 10*3/uL (ref 0.1–1.0)
Monocytes Relative: 10 %
Neutro Abs: 7.6 10*3/uL (ref 1.7–7.7)
Neutrophils Relative %: 76 %
Platelets: 287 10*3/uL (ref 150–400)
RBC: 4.63 MIL/uL (ref 3.87–5.11)
RDW: 15.2 % (ref 11.5–15.5)
WBC: 10.1 10*3/uL (ref 4.0–10.5)
nRBC: 0 % (ref 0.0–0.2)

## 2019-08-21 LAB — COMPREHENSIVE METABOLIC PANEL
ALT: 10 U/L (ref 0–44)
AST: 11 U/L — ABNORMAL LOW (ref 15–41)
Albumin: 3.3 g/dL — ABNORMAL LOW (ref 3.5–5.0)
Alkaline Phosphatase: 102 U/L (ref 38–126)
Anion gap: 12 (ref 5–15)
BUN: 9 mg/dL (ref 6–20)
CO2: 24 mmol/L (ref 22–32)
Calcium: 8.9 mg/dL (ref 8.9–10.3)
Chloride: 102 mmol/L (ref 98–111)
Creatinine, Ser: 0.83 mg/dL (ref 0.44–1.00)
GFR calc Af Amer: >60 mL/min (ref >60)
GFR calc non Af Amer: >60 mL/min (ref >60)
Glucose, Bld: 111 mg/dL — ABNORMAL HIGH (ref 70–99)
Potassium: 3.4 mmol/L — ABNORMAL LOW (ref 3.5–5.1)
Sodium: 138 mmol/L (ref 135–145)
Total Bilirubin: 0.5 mg/dL (ref 0.3–1.2)
Total Protein: 7.5 g/dL (ref 6.5–8.1)

## 2019-08-21 LAB — I-STAT BETA HCG BLOOD, ED (MC, WL, AP ONLY): I-stat hCG, quantitative: <5 m[IU]/mL (ref <5)

## 2019-08-21 MED ORDER — ACETAMINOPHEN 500 MG PO TABS
1000.0000 mg | ORAL_TABLET | Freq: Once | ORAL | Status: AC
  Administered 2019-08-21: 1000 mg via ORAL
  Filled 2019-08-21: qty 2

## 2019-08-21 NOTE — ED Triage Notes (Signed)
Patient arrived with EMS from home reports dizziness this morning , no emesis or fever , hypertensive at triage .

## 2019-08-21 NOTE — ED Provider Notes (Signed)
[facility] EMERGENCY DEPARTMENT Provider Note   CSN: [id number] Arrival date & time: 08/21/19  0549     History Chief Complaint  Patient presents with  . Dizziness    Kendra Harris is a 39 y.o. female.  39 year old female with prior medical history detailed below presents for evaluation of right-sided head and neck pain.  Patient was seen last week for dental infection.  She is currently on amoxicillin for same.  She complains of improving pain to the right lower jaw.  She complains that over the weekend she had some vague tenderness to the right temple, right head behind the ear, and right side of her neck.  She denies recent fever.  She denies difficulty swallowing or with phonation.  She denies other specific complaint.  She did take some ibuprofen at home with minimal improvement in her symptoms.  The history is provided by the patient and medical records.  Illness Location:  Dental pain, headache Severity:  Mild Onset quality:  Gradual Duration:  3 days Timing:  Intermittent Progression:  Waxing and waning Chronicity:  New Associated symptoms: no fever        Past Medical History:  Diagnosis Date  . DELAYED MENSES 02/11/2010   Qualifier: Diagnosis of  By: Wallene Huh  MD, Rande Lawman    . Gestational diabetes   . Hypertension   . Urinary tract infection 03/04/2011    Patient Active Problem List   Diagnosis Date Noted  . Infected tooth 08/20/2019  . Elevated BP without diagnosis of hypertension 08/20/2019  . Cervicalgia 07/01/2019  . Tension headache 06/19/2019  . Anemia 04/11/2019  . Major depressive disorder, recurrent episode, severe, with psychotic behavior (HCC) 08/14/2018  . Cervical cancer screening 08/09/2018  . UTI (urinary tract infection) 07/27/2013  . Menorrhagia 04/27/2013  . Severe major depression with psychotic features (HCC) 07/24/2010  . MIGRAINE HEADACHE 12/13/2007  . Essential hypertension, benign 08/31/2007  . Morbid obesity  with BMI of 45.0-49.9, adult (HCC) 04/07/2006    Past Surgical History:  Procedure Laterality Date  . NO PAST SURGERIES       OB History    Gravida  5   Para  5   Term  5   Preterm  0   AB  0   Living  5     SAB  0   TAB  0   Ectopic  0   Multiple  0   Live Births  5           Family History  Problem Relation Age of Onset  . Sickle cell trait Sister     Social History   Tobacco Use  . Smoking status: Never Smoker  . Smokeless tobacco: Never Used  Substance Use Topics  . Alcohol use: No  . Drug use: No    Home Medications Prior to Admission medications   Medication Sig Start Date End Date Taking? Authorizing Provider  amLODipine (NORVASC) 5 MG tablet Take 1 tablet (5 mg total) by mouth at bedtime. 08/16/19   McDiarmid, Leighton Roach, MD  amoxicillin (AMOXIL) 875 MG tablet Take 1 tablet (875 mg total) by mouth 2 (two) times daily for 10 days. 08/16/19 08/26/19  McDiarmid, Leighton Roach, MD  buPROPion (WELLBUTRIN XL) 150 MG 24 hr tablet Take 1 tablet (150 mg total) by mouth daily. 06/01/19   Ellwood Dense, DO  cyclobenzaprine (FLEXERIL) 10 MG tablet Take 1 tablet (10 mg total) by mouth 3 (three) times daily as needed for  muscle spasms. 08/20/19   Jackelyn Poling, DO  diclofenac Sodium (VOLTAREN) 1 % GEL Apply 4 g topically 4 (four) times daily. 06/28/19   Shirley, [place], DO  FLUoxetine (PROZAC) 20 MG capsule Take 3 capsules (60 mg total) by mouth daily. 05/18/19   Sandre Kitty, MD  ibuprofen (ADVIL) 600 MG tablet Take 1 tablet (600 mg total) by mouth every 6 (six) hours as needed for moderate pain. 08/16/19   McDiarmid, Leighton Roach, MD  Prenatal Vit-Fe Fumarate-FA (PRENATAL MULTIVITAMIN) TABS tablet Take 1 tablet by mouth daily. Fill as [employer] HR is covered Patient not taking: Reported on 06/25/2019 08/21/18   Malvin Johns, MD  propranolol (INDERAL) 80 MG tablet Take 0.5 tablets (40 mg total) by mouth 2 (two) times daily. 06/02/19   Ellwood Dense, DO  traZODone (DESYREL) 50 MG  tablet Take 0.5-1 tablets (25-50 mg total) by mouth at bedtime as needed for sleep. Patient not taking: Reported on 06/25/2019 03/06/19   Mirian Mo, MD    Allergies    Patient has no known allergies.  Review of Systems   Review of Systems  Constitutional: Negative for fever.  All other systems reviewed and are negative.   Physical Exam Updated Vital Signs BP (!) 160/109 (BP Location: Left Wrist)   Pulse 86   Temp 99.3 F (37.4 C) (Oral)   Resp 20   Ht 5\' 2"  (1.575 m)   Wt 125 kg   LMP 07/25/2019   SpO2 100%   BMI 50.40 kg/m   Physical Exam Vitals and nursing note reviewed.  Constitutional:      General: She is not in acute distress.    Appearance: Normal appearance. She is well-developed.  HENT:     Head: Normocephalic and atraumatic.     Comments: Mild lymphadenopathy Eyes:     Conjunctiva/sclera: Conjunctivae normal.     Pupils: Pupils are equal, round, and reactive to light.  Cardiovascular:     Rate and Rhythm: Normal rate and regular rhythm.     Heart sounds: Normal heart sounds.  Pulmonary:     Effort: Pulmonary effort is normal. No respiratory distress.     Breath sounds: Normal breath sounds.  Abdominal:     General: There is no distension.     Palpations: Abdomen is soft.     Tenderness: There is no abdominal tenderness.  Musculoskeletal:        General: No deformity. Normal range of motion.     Cervical back: Normal range of motion and neck supple.  Skin:    General: Skin is warm and dry.  Neurological:     Mental Status: She is alert and oriented to person, place, and time.     ED Results / Procedures / Treatments   Labs (all labs ordered are listed, but only abnormal results are displayed) Labs Reviewed  CBC WITH DIFFERENTIAL/PLATELET - Abnormal; Notable for the following components:      Result Value   Hemoglobin 11.9 (*)    MCH 25.7 (*)    All other components within normal limits  COMPREHENSIVE METABOLIC PANEL - Abnormal; Notable for  the following components:   Potassium 3.4 (*)    Glucose, Bld 111 (*)    Albumin 3.3 (*)    AST 11 (*)    All other components within normal limits  I-STAT BETA HCG BLOOD, ED ([facility], [facility], [facility] ONLY)    EKG None  Radiology No results found.  Procedures Procedures (including critical care time)  Medications  Ordered in ED Medications - No data to display  ED Course  I have reviewed the triage vital signs and the nursing notes.  Pertinent labs & imaging results that were available during my care of the patient were reviewed by me and considered in my medical decision making (see chart for details).    MDM Rules/Calculators/A&P                          MDM  Screen complete  Kendra Harris was evaluated in Emergency Department on 08/21/2019 for the symptoms described in the history of present illness. She was evaluated in the context of the global COVID-19 pandemic, which necessitated consideration that the patient might be at risk for infection with the SARS-CoV-2 virus that causes COVID-19. Institutional protocols and algorithms that pertain to the evaluation of patients at risk for COVID-19 are in a state of rapid change based on information released by regulatory bodies including the CDC and federal and state organizations. These policies and algorithms were followed during the patient's care in the ED.  Patient is presenting for evaluation of right-sided headache.  Patient with recently diagnosed right-sided dental infection.  Patient's presentation today is consistent with likely pain from lymphadenopathy related to her dental infection.  She is otherwise without significant acute findings on exam.  She appears to be appropriate for discharge.  Strict return cautions given and understood.   Final Clinical Impression(s) / ED Diagnoses Final diagnoses:  Nonintractable headache, unspecified chronicity pattern, unspecified headache type    Rx / DC Orders ED Discharge Orders      None       Wynetta Fines, MD 08/21/19 1350

## 2019-08-21 NOTE — Discharge Instructions (Addendum)
Please return for any problem.  Follow-up with your regular care provider as instructed. °

## 2019-08-21 NOTE — ED Notes (Signed)
Patient verbalizes understanding of discharge instructions. Opportunity for questioning and answers were provided. Armband removed by staff, pt discharged from ED ambulatory.   

## 2019-08-28 ENCOUNTER — Telehealth: Payer: Self-pay

## 2019-08-28 ENCOUNTER — Encounter: Payer: Self-pay | Admitting: Family Medicine

## 2019-08-28 ENCOUNTER — Other Ambulatory Visit: Payer: Self-pay

## 2019-08-28 ENCOUNTER — Ambulatory Visit (INDEPENDENT_AMBULATORY_CARE_PROVIDER_SITE_OTHER): Payer: Medicaid Other | Admitting: Family Medicine

## 2019-08-28 VITALS — BP 166/107 | HR 75 | Ht 62.0 in | Wt 264.8 lb

## 2019-08-28 DIAGNOSIS — I10 Essential (primary) hypertension: Secondary | ICD-10-CM

## 2019-08-28 DIAGNOSIS — Z79899 Other long term (current) drug therapy: Secondary | ICD-10-CM

## 2019-08-28 MED ORDER — HYDROCHLOROTHIAZIDE 12.5 MG PO TABS: 12.5000 mg | ORAL_TABLET | Freq: Every day | ORAL | 3 refills | Status: DC

## 2019-08-28 MED ORDER — AMLODIPINE BESYLATE 10 MG PO TABS: 10.0000 mg | ORAL_TABLET | Freq: Every day | ORAL | 3 refills | Status: DC

## 2019-08-28 MED ORDER — AMLODIPINE BESYLATE 10 MG PO TABS
10.0000 mg | ORAL_TABLET | Freq: Every day | ORAL | 3 refills | Status: DC
Start: 2019-08-28 — End: 2019-08-28

## 2019-08-28 NOTE — Assessment & Plan Note (Signed)
Blood Pressure currently significantly elevated, on repeat manual measurement was 166/107.  Will prescribe dual therapy.  Currently only on Amlodipine 5 mg. -Increase Amlodipine to 10 mg qd -Start HCTZ 25 mg qd, if issues with frequent urination during sleep can take during day -Praised patient for past success with diet and exercise, instructed on continued benefits of DASH diet and weight loss, and patient indicated she was starting to return to both -Follow-up in 2 weeks

## 2019-08-28 NOTE — Telephone Encounter (Signed)
[facility] calls nurse line reporting Kendra Harris is not medicaid approved yet, therefore BP medication sent in today can not billed. Pharmacy asking prescriptions be sent in under an attending, so patient can get her prescriptions today. Will send in under a preceptor.

## 2019-08-28 NOTE — Addendum Note (Signed)
Addended by: Manson Passey, Sissi Padia on: 08/28/2019 03:22 PM   Modules accepted: Orders

## 2019-08-28 NOTE — Progress Notes (Signed)
    SUBJECTIVE:   CHIEF COMPLAINT / HPI: Hypertension  Kendra Harris is a 39 yo female.  She recently went to go see dentist regarding dental infection, but they indicated for her to come see doctor due to high blood pressure.  Patient indicated she was diagnosed with hypertension a year ago but it has only been in 160's and 170's recently.  She indicates 1 month ago she was switched to Amlodipine 5 mg and stopped taking Propanolol 80 mg that she was taking previously.  She denies any headache (outside of toothache), vision changes, lightheadedness, chest pain, or difficulty breathing.  She indictaes she had a good system before where she was eating helathy home cooked fruits and vegetables and was exercising at the gym, but recently gained weight due to life stressors such as Grandmother passing away and Mom's health history.  PERTINENT  PMH / PSH: Essential Hypertension  OBJECTIVE:   BP (!) 166/107   Pulse 75   Ht 5\' 2"  (1.575 m)   Wt 264 lb 12.8 oz (120.1 kg)   SpO2 99%   BMI 48.43 kg/m     Physical Exam Constitutional:      General: She is not in acute distress.    Appearance: Normal appearance.  HENT:     Head: Normocephalic and atraumatic.  Eyes:     General: No scleral icterus. Cardiovascular:     Pulses: Normal pulses.     Heart sounds: Normal heart sounds.  Pulmonary:     Effort: Pulmonary effort is normal.     Breath sounds: Normal breath sounds.  Neurological:     Mental Status: She is alert.     ASSESSMENT/PLAN:   Essential hypertension, benign Blood Pressure currently significantly elevated, on repeat manual measurement was 166/107.  Will prescribe dual therapy.  Currently only on Amlodipine 5 mg. -Increase Amlodipine to 10 mg qd -Start HCTZ 25 mg qd, if issues with frequent urination during sleep can take during day -Praised patient for past success with diet and exercise, instructed on continued benefits of DASH diet and weight loss, and patient  indicated she was starting to return to both -Follow-up in 2 weeks     , MD [facility]

## 2019-08-28 NOTE — Patient Instructions (Signed)
It was good to see you today.  Thank you for coming in.  I think you have High Blood Pressure.      I am increasing your Amlodipine to 10 mg and starting you on HCTZ 12.5 mg to be taken at night.  I would like to follow-up in 2 weeks.  Be Well, Jovita Kussmaul MD

## 2019-08-28 NOTE — Telephone Encounter (Signed)
New Rx to pharmacy.  Adalynne Steffensmeier, MD  Family Medicine Teaching Service   

## 2019-08-29 LAB — BASIC METABOLIC PANEL
BUN/Creatinine Ratio: 14 (ref 9–23)
BUN: 10 mg/dL (ref 6–24)
CO2: 22 mmol/L (ref 20–29)
Calcium: 9 mg/dL (ref 8.7–10.2)
Chloride: 101 mmol/L (ref 96–106)
Creatinine, Ser: 0.69 mg/dL (ref 0.57–1.00)
GFR calc Af Amer: 126 mL/min/{1.73_m2} (ref >59)
GFR calc non Af Amer: 109 mL/min/{1.73_m2} (ref >59)
Glucose: 105 mg/dL — ABNORMAL HIGH (ref 65–99)
Potassium: 3.8 mmol/L (ref 3.5–5.2)
Sodium: 139 mmol/L (ref 134–144)

## 2019-09-18 ENCOUNTER — Ambulatory Visit (HOSPITAL_COMMUNITY)
Admission: EM | Admit: 2019-09-18 | Discharge: 2019-09-18 | Disposition: A | Discharge status: Home / Self Care | Payer: Medicaid Other

## 2019-09-19 ENCOUNTER — Ambulatory Visit: Payer: Medicaid Other | Admitting: Family Medicine

## 2019-09-28 ENCOUNTER — Other Ambulatory Visit: Payer: Self-pay | Admitting: Family Medicine

## 2020-01-15 IMAGING — CR DG SHOULDER 2+V*L*
3 series · 4 of 4 positions shown · non-contrast
Comparison: None.

CLINICAL DATA: Pain in the left shoulder off and on for 2 months.
No injury.

EXAM:
LEFT SHOULDER - 2+ VIEW

[shoulder grashey]
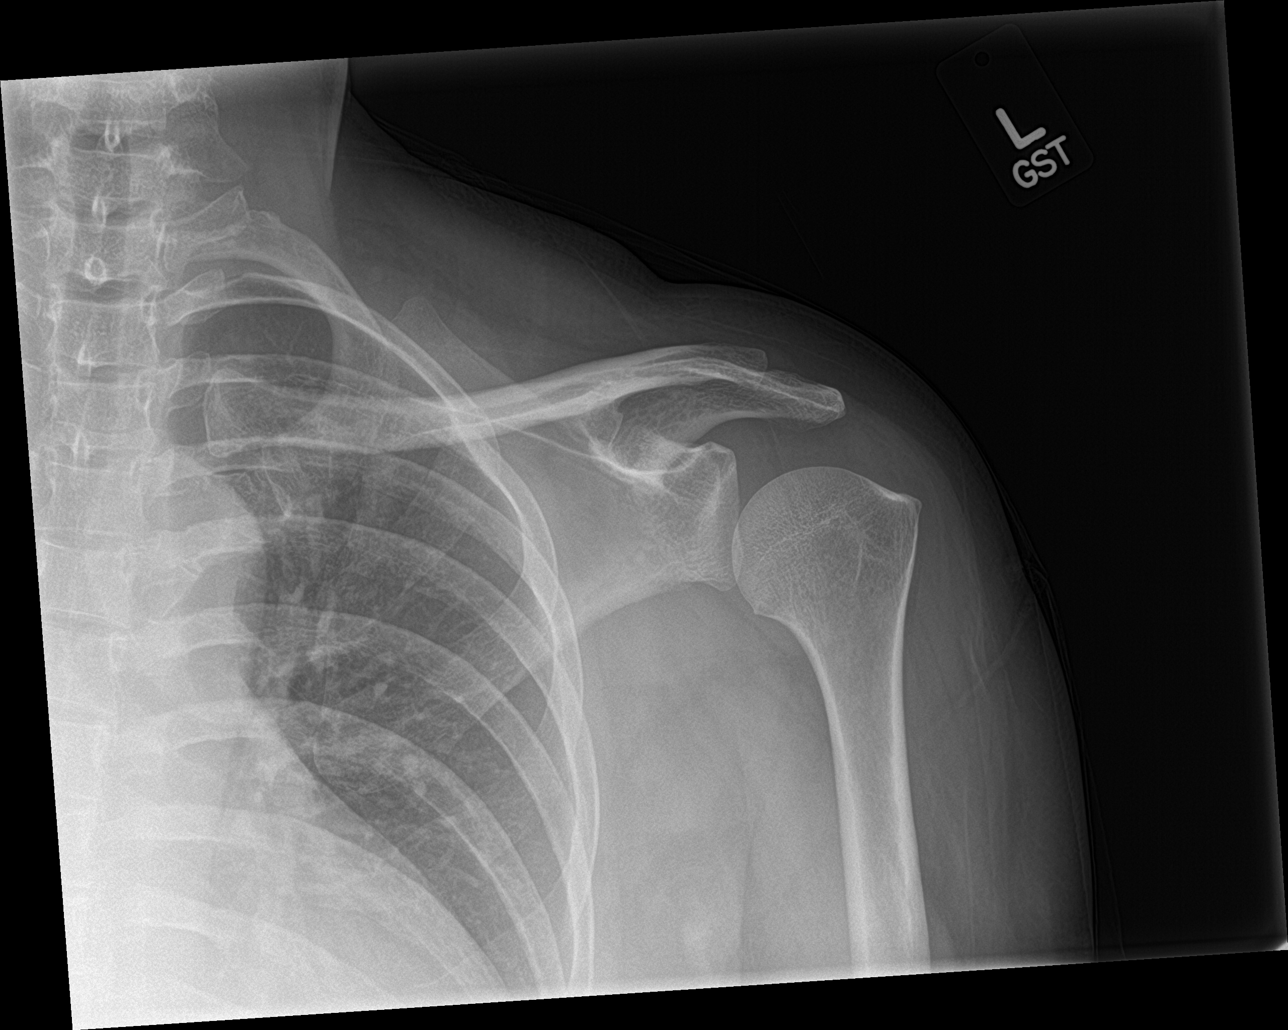

[shoulder y view]
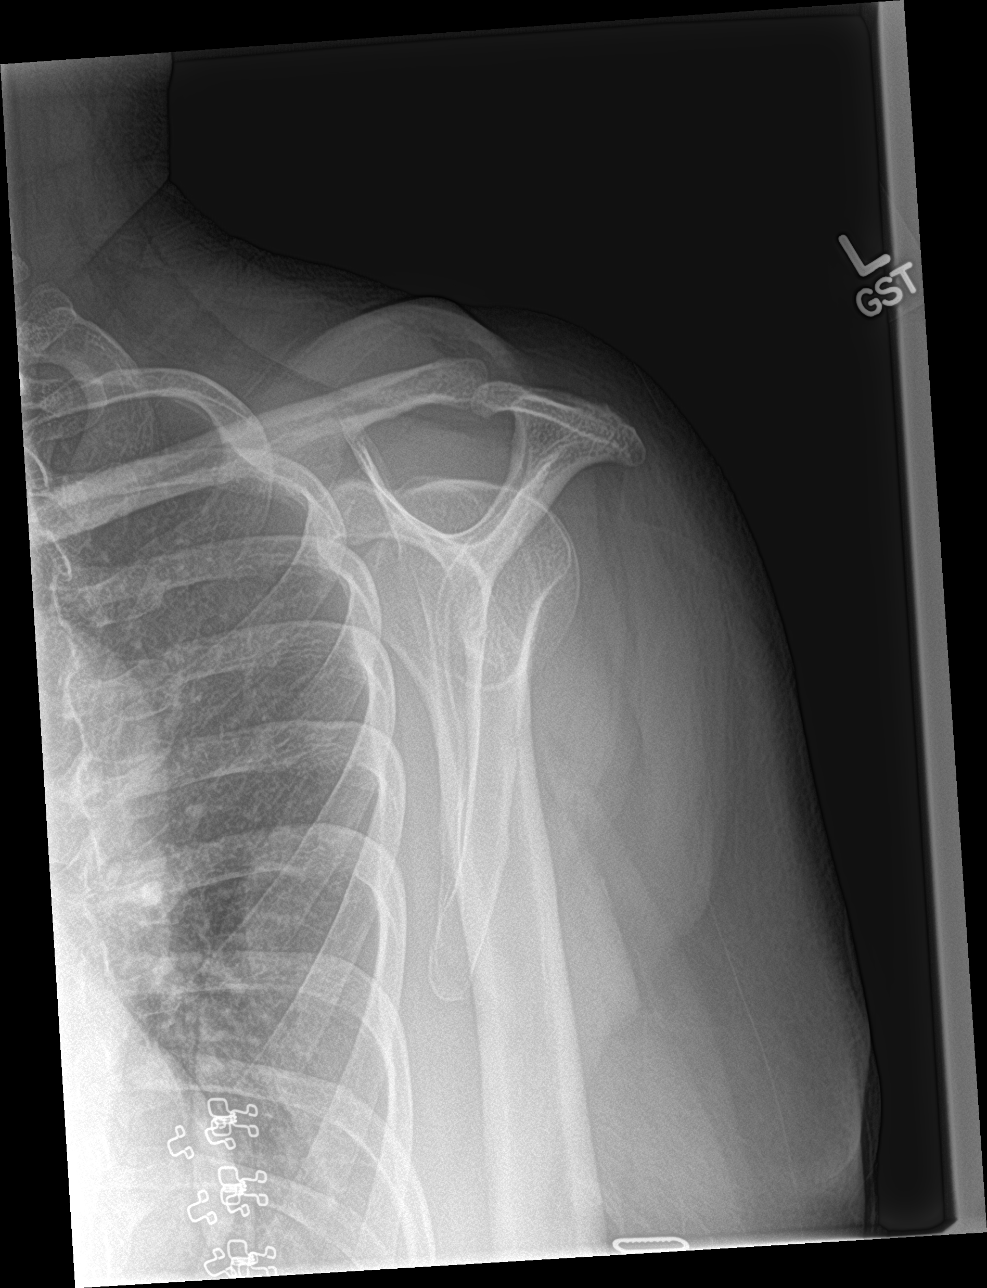

[Series 3: shoulder axillary · 0.14mm/px · 2 of 2 slices shown]
[im 1/2]
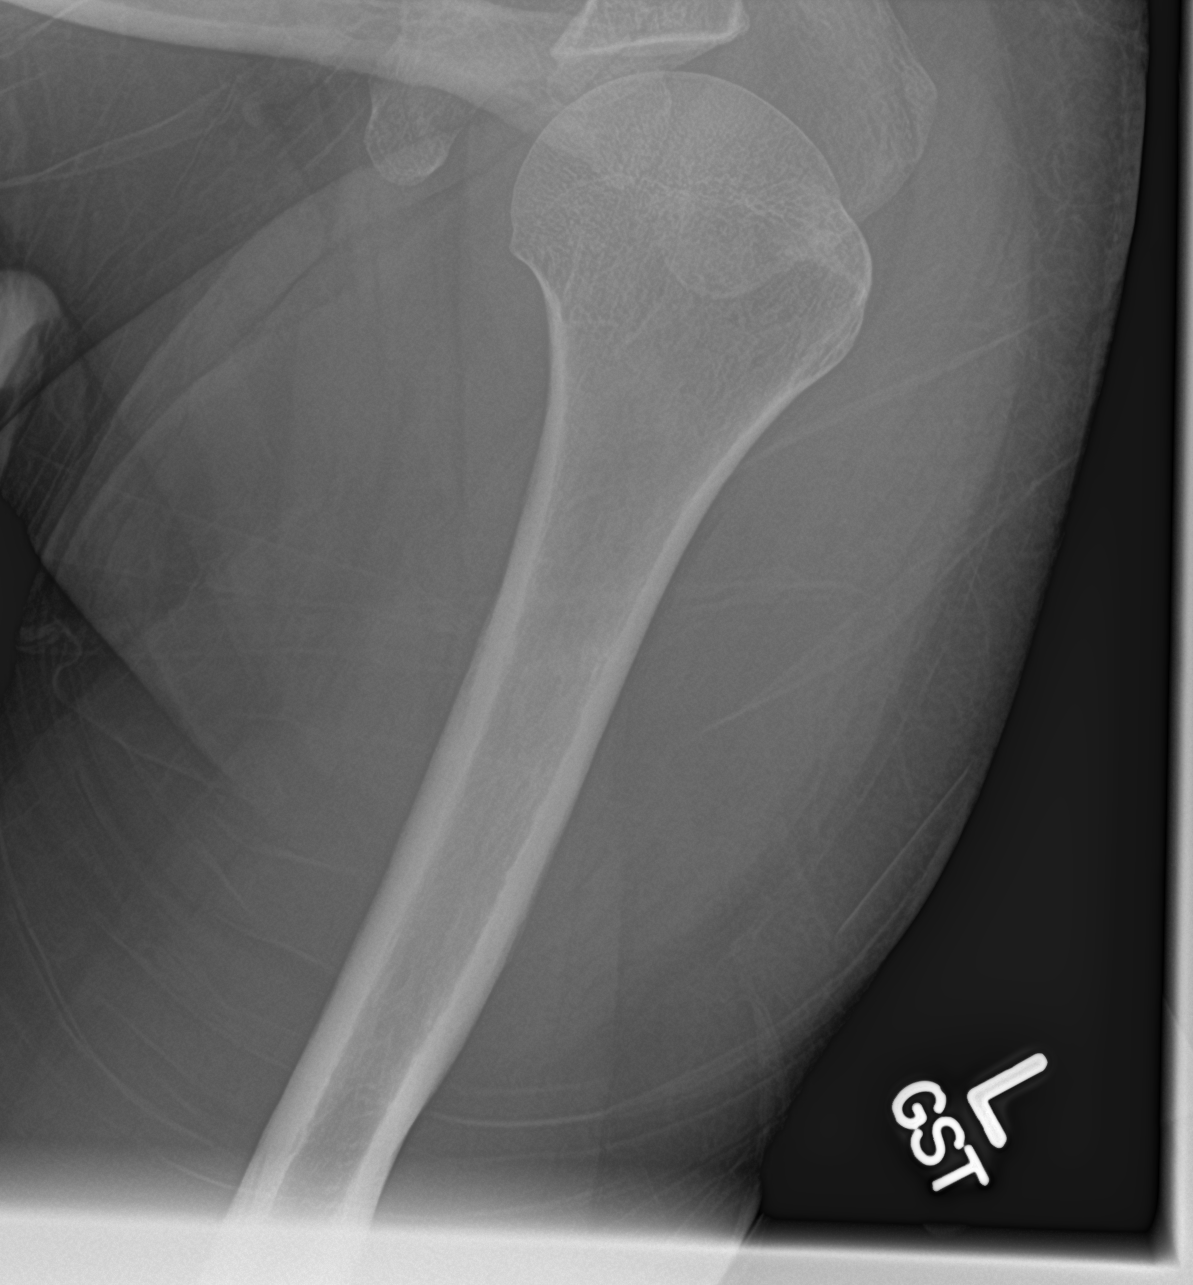
[im 2/2]
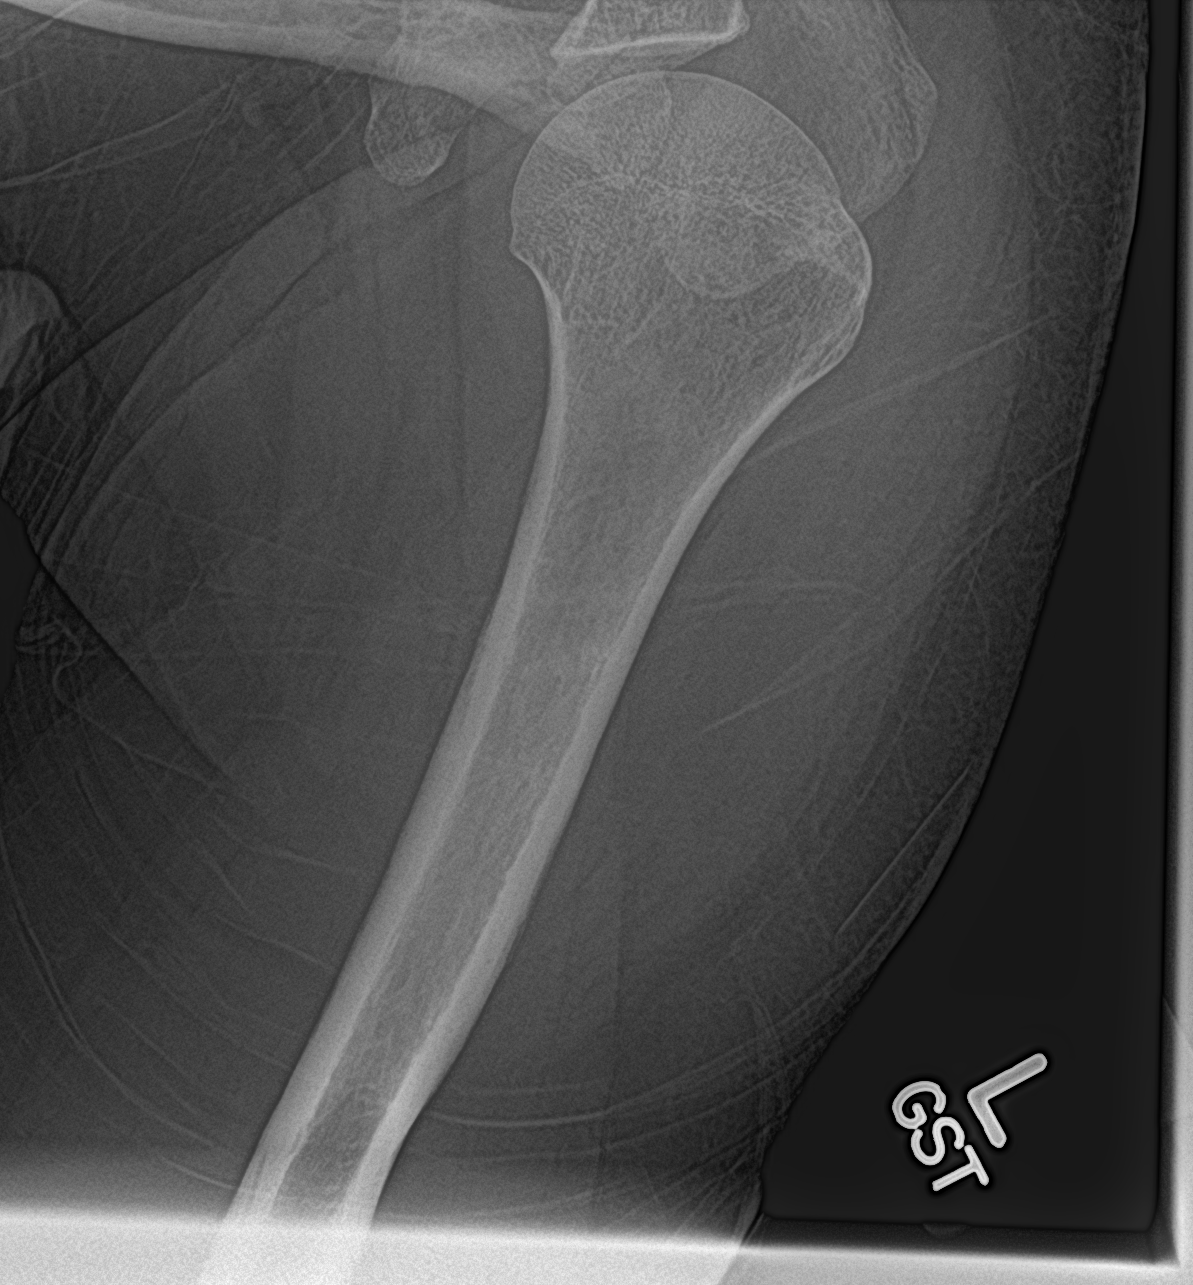

[4 of 4 positions shown; findings below may reference images not displayed]

FINDINGS: There is no evidence of fracture or dislocation. There is no
evidence of arthropathy or other focal bone abnormality. Soft
tissues are unremarkable.
IMPRESSION: Negative.

## 2020-01-30 NOTE — Progress Notes (Deleted)
    SUBJECTIVE:   CHIEF COMPLAINT / HPI: "tightness in back of head"  Patient with hx of tension HA presents with complaint of tension in back of her head for *** days.  *** There is no history of head imaging.  Tension as well as migraine type headaches.  PERTINENT  PMH / PSH: ***  OBJECTIVE:   There were no vitals taken for this visit.  General: *** appearing stated age in no acute distress HEENT: MMM, no oral lesions noted,Neck non-tender without lymphadenopathy Cardio: Normal S1 and S2, no S3 or S4. Rhythm is regular. No murmurs or rubs.  Bilateral radial pulses palpable Pulm: Clear to auscultation bilaterally, no crackles, wheezing, or diminished breath sounds. Normal respiratory effort Abdomen: Bowel sounds normal. Abdomen soft and non-tender.  Extremities: No peripheral edema. Warm & well perfused.  Neuro: pt alert and oriented x4, follows commands, PERRLA, EOMI bilaterally   ASSESSMENT/PLAN:   No problem-specific Assessment & Plan notes found for this encounter.     Ronnald Ramp, MD [facility]

## 2020-01-31 ENCOUNTER — Ambulatory Visit: Payer: Medicaid Other | Admitting: Family Medicine

## 2020-02-04 NOTE — Progress Notes (Signed)
SUBJECTIVE:   CHIEF COMPLAINT / HPI: headaches  HA and HTN: patient with h/o htn and headaches. Presently being treated with amlodipine 10 mg. BP at home has been improved, as last visit patient was consistently 160s-170s SBP. Today BP is 138/102, which is improved, but still not at goal, especially for DBP. Patient was also supposed to be on HCTZ 12.5 mg, but states that she ran out of it and did not get a refill. Patient has a history of migraine HA's, which are usually front and bilateral. Patient reports the pain for this headache is intermittent, she has noticed it being worse the last 2 weeks, but chart review shows she has had similar pain for a good portion of this year, especially since starting amlodipine. Headache starts in the back and wraps around to the sides bilaterally. She denies aura, photophobia, nausea, not the worst headache of her life, no changes in vision.  Neck pain: And headaches patient reports that she has neck pain, specifically tightness on the back of her neck, bilaterally, as well as her shoulders. She notes that this does get better with stretching, she is used Voltaren gel in the past which helped.   MDD: Reports worsening of depression recently. She has been treated for depression in the past, was on Wellbutrin earlier this year but cannot remember if it helped her or not, also has used that before that. Suspect inadequate trial of SSRIs. Patient has previously been seen for therapy at [facility] the [place]. She reports that she has not seen them since January or February 2021. No SI/HI/VAH. Patient is interested in both medication and therapy.  Hep C: patient low risk, has not had this quality measure checked, she opts in for screening today.  LMP: in November, patient reports irregular periods. She has not been sexually active in 2 years.  PERTINENT  PMH / PSH: Hypertension, depression, migraine  OBJECTIVE:   BP (!) 138/102   Pulse 76   Ht 5'  2" (1.575 m)   Wt 226 lb 9.6 oz (102.8 kg)   LMP 12/19/2019 (Exact Date)   SpO2 94%   BMI 41.45 kg/m   Nursing note and vitals reviewed GEN: Age-appropriate AA W, resting comfortably in chair, NAD, obese HEENT: NCAT. Sclera without injection or icterus. MMM.  Neck: Supple, full range of motion, no step-off centrally, no point tenderness in paraspinal muscles. Patient does have muscle tension in both paraspinal muscles of cervical spine, however muscle tension increased on left than right. Trapezius muscles are both tight Cardiac: Regular rate and rhythm. Normal S1/S2. No murmurs, rubs, or gallops appreciated. 2+ radial pulses. Lungs: Clear bilaterally to ascultation. No increased WOB, no accessory muscle usage. No w/r/r. Neuro: Alert and at baseline Cranial Nerves: II: PERRL.  III,IV, VI: EOMI without ptosis or diplopia.  V: Facial sensation is symmetric to touch VII: Facial movement is symmetric.  VIII: hearing is intact to voice X: Palate elevates symmetrically XI: Shoulder shrug is symmetric. XII: tongue is midline without atrophy or fasciculations.  Ext: no edema Psych: Pleasant and appropriate, fluent speech, full affect  PHQ9 SCORE ONLY 02/05/2020 08/20/2019 08/16/2019  PHQ-9 Total Score 13 0 0   ASSESSMENT/PLAN:   Essential hypertension, benign Hypertension: - Medications: Amlodipine 10 mg - Compliance: Good - Checking BP at home: Occasionally - Denies any SOB, CP, vision changes, LE edema, medication SEs, or symptoms of hypotension - Diet: SAD - Exercise: Minimal Discontinue amlodipine 10 mg due to headaches. Start  Hyzaar 50mg -12.5 mg; she has tolerated HCTZ in the past and due to elevation of DBP patient will require two medications to achieve goal of less than 130/80. Counseled on possible SEs, see AVS. -Since starting ARB today, will obtain BMP. Follow-up in 2 weeks for BP check.   Severe major depression without psychotic features [facility]) Patient previously  diagnosed with MDD with psychotic features, currently not present. Patient has long history of depression, tends to flare over holidays. Patient has likely not had adequate trials of SSRI, counseled extensively on importance of adequate trial of medication, patient reports she is willing to try medication for 4 to 6 weeks. Will start Lexapro 10 mg daily for 7 days, then increase to 20 mg thereafter. Counseled on SEs, see AVS. We will do phone follow-up in 1 week, as well as in person visit in 2 weeks. Currently no SI, no history of manic symptoms. Patient will also contact [facility] and restart counseling.  Cervicalgia Patient with history of tension type HA and neck pain. Physical exam reveals normal function and ROM, but with tight musculature of paraspinal muscles and trapezius muscles bilaterally. Given exercises for home and stretching, can use capsaicin or voltaren gel for pain. Also recommend hot packs or warm baths and massages for relaxation.  Healthcare maintenance Hep C screening performed today     [place], MD [facility]

## 2020-02-04 NOTE — Patient Instructions (Addendum)
It was a pleasure to see you today!  1. For Depression and anxiety: we will start a new medication called lexapro. To start, take 1 pill (10 mg total) daily for 7 days, then increase to 2 pills (20 mg total) thereafter. If you have problems like trouble buttoning buttons from hand shaking, or develop feelings that you may hurt yourself, please call us immediately [phone]. I also recommend you reconnect with [facility] [place] for counseling, see below for contact. I have also put some information below in case of a crisis. We will follow up by phone in 1 week, and then in person in 2 weeks.  2. For your headaches: please stop taking amlodipine. I also recommend you do things to help with your neck soreness such as soak in hot bath, massage, stretching exercises, and you can use over the counter options like salonpas, voltaren gel, or capsaicin cream for relief.   3. For high blood pressure: start taking hyzaar, this is a combination medicine of losartan (an ARB) and HCTZ (a medicine you have used before), these are both very good for lowering blood pressure. Follow up in 2 weeks to check your blood pressue. If you develop a persistent cough (with no other infectious symptoms) or lip swelling stop taking the medication and call me immediately [phone].  Be Well,  Dr. Leary Roca  Therapy and Counseling Resources Most providers on this list will take Medicaid. Patients with commercial insurance or Medicare should contact their insurance company to get a list of in network providers.  [facility]iatry and Counseling [address] [place], [place] [address] [phone]  [facility]  [address] [place], [place] [address]      [phone]  [facility] [address]  [place], [place] [address] [phone]  [facility] [address]  [place], [place] [address]       [phone]      Jovita Kussmaul [facility] [address], [place], [place] [phone]  [facility]:  [address] [place] [phone]  [facility]:  [address] [place]ie [place]iener [phone]  [facility] (under & uninsured) [address]   [place] [phone]    [email or web address]    [facility] [address] . [place]    [phone]  [facility] -[address]     Phone:  [phone]     [facility]-  [address]  [phone]   [facility] [address]      [place], [place] [phone]  [facility]: [address] [place], [place], [place]  [phone]   [facility] (Spanish therapist) [email or web address]  [address] [place]  [address]   [place] [address]    [phone]    [facility]   [address],  [place], [place]  [phone]   [facility]  [address] [place]  [phone]  [facility]  [address] [place], [place]        [phone]  Open Access/Walk In Clinic under & uninsured  [facility]  [address] [place], [place] [phone] Crisis [phone]  [facility] [place],  (Spanish)   [address], [place]: [phone]) 8:30 -  12; 1 - 2:30  [facility] [employer],  [address] [place]    [phone]):8:30 - 12; 2 - 3PM  [facility] [employer],  [address]  [place]; [phone]):   Mon - Fri 8 AM - 5 PM  [facility] [address] [place]  MWF 12:30 to 3:00 or call to schedule an appointment  [phone]  Specific Provider options Psychology Today  [email or web address] 1. click on find a therapist  2. enter your zip code 3. left side and select or tailor a  therapist for your specific need.   [facility] Provider Directory [email or web address]  (Medicaid)   Follow all drop down to find a provider  Social Support program [facility] [place] [phone] or Photo[employer] [address], [place], [place] Recovery support and educational   24- Hour Availability:  .  Marland Kitchen [facility]  . [address] [place], [place] [employer] [phone] Crisis [phone]  . [facility] [phone]  [facility] Crisis Service  [phone]   . [facility] [employer]  [phone] (after hours)  . Therapeutic Alternative/Mobile Crisis   [phone]  . [place] National Suicide Hotline  (615)145-6138 (TALK)  . Call 911 or go to emergency room  . [employer]  [phone]);  [place] and [employer]   . [facility]  [phone]); [place], [place], [place], [place], [place], [place], [place]

## 2020-02-05 ENCOUNTER — Other Ambulatory Visit: Payer: Self-pay | Admitting: Family Medicine

## 2020-02-05 ENCOUNTER — Other Ambulatory Visit: Payer: Self-pay

## 2020-02-05 ENCOUNTER — Ambulatory Visit (INDEPENDENT_AMBULATORY_CARE_PROVIDER_SITE_OTHER): Payer: Medicaid Other | Admitting: Family Medicine

## 2020-02-05 VITALS — BP 138/102 | HR 76 | Ht 62.0 in | Wt 226.6 lb

## 2020-02-05 DIAGNOSIS — I10 Essential (primary) hypertension: Secondary | ICD-10-CM | POA: Diagnosis not present

## 2020-02-05 DIAGNOSIS — F322 Major depressive disorder, single episode, severe without psychotic features: Secondary | ICD-10-CM

## 2020-02-05 DIAGNOSIS — Z0000 Encounter for general adult medical examination without abnormal findings: Secondary | ICD-10-CM | POA: Insufficient documentation

## 2020-02-05 DIAGNOSIS — Z1159 Encounter for screening for other viral diseases: Secondary | ICD-10-CM

## 2020-02-05 DIAGNOSIS — F333 Major depressive disorder, recurrent, severe with psychotic symptoms: Secondary | ICD-10-CM

## 2020-02-05 DIAGNOSIS — M542 Cervicalgia: Secondary | ICD-10-CM

## 2020-02-05 MED ORDER — LOSARTAN POTASSIUM-HCTZ 50-12.5 MG PO TABS: 1.0000 | ORAL_TABLET | Freq: Every day | ORAL | 1 refills | Status: DC

## 2020-02-05 MED ORDER — ESCITALOPRAM OXALATE 10 MG PO TABS: 10.0000 mg | ORAL_TABLET | Freq: Every day | ORAL | 0 refills | Status: DC

## 2020-02-05 NOTE — Assessment & Plan Note (Signed)
Hep C screening performed today

## 2020-02-05 NOTE — Assessment & Plan Note (Signed)
Patient with history of tension type HA and neck pain. Physical exam reveals normal function and ROM, but with tight musculature of paraspinal muscles and trapezius muscles bilaterally. Given exercises for home and stretching, can use capsaicin or voltaren gel for pain. Also recommend hot packs or warm baths and massages for relaxation.

## 2020-02-05 NOTE — Assessment & Plan Note (Signed)
Patient previously diagnosed with MDD with psychotic features, currently not present. Patient has long history of depression, tends to flare over holidays. Patient has likely not had adequate trials of SSRI, counseled extensively on importance of adequate trial of medication, patient reports she is willing to try medication for 4 to 6 weeks. Will start Lexapro 10 mg daily for 7 days, then increase to 20 mg thereafter. Counseled on SEs, see AVS. We will do phone follow-up in 1 week, as well as in person visit in 2 weeks. Currently no SI, no history of manic symptoms. Patient will also contact [facility] [place] and restart counseling.

## 2020-02-05 NOTE — Assessment & Plan Note (Signed)
Hypertension: - Medications: Amlodipine 10 mg - Compliance: Good - Checking BP at home: Occasionally - Denies any SOB, CP, vision changes, LE edema, medication SEs, or symptoms of hypotension - Diet: SAD - Exercise: Minimal Discontinue amlodipine 10 mg due to headaches. Start Hyzaar 50mg -12.5 mg; she has tolerated HCTZ in the past and due to elevation of DBP patient will require two medications to achieve goal of less than 130/80. Counseled on possible SEs, see AVS. -Since starting ARB today, will obtain BMP. Follow-up in 2 weeks for BP check.

## 2020-02-06 ENCOUNTER — Other Ambulatory Visit: Payer: Self-pay | Admitting: Family Medicine

## 2020-02-06 DIAGNOSIS — F333 Major depressive disorder, recurrent, severe with psychotic symptoms: Secondary | ICD-10-CM

## 2020-02-06 LAB — BASIC METABOLIC PANEL
BUN/Creatinine Ratio: 11 (ref 9–23)
BUN: 9 mg/dL (ref 6–24)
CO2: 22 mmol/L (ref 20–29)
Calcium: 9.5 mg/dL (ref 8.7–10.2)
Chloride: 101 mmol/L (ref 96–106)
Creatinine, Ser: 0.8 mg/dL (ref 0.57–1.00)
GFR calc Af Amer: 107 mL/min/{1.73_m2} (ref >59)
GFR calc non Af Amer: 93 mL/min/{1.73_m2} (ref >59)
Glucose: 105 mg/dL — ABNORMAL HIGH (ref 65–99)
Potassium: 4.1 mmol/L (ref 3.5–5.2)
Sodium: 138 mmol/L (ref 134–144)

## 2020-02-06 LAB — HEPATITIS C ANTIBODY: Hep C Virus Ab: <0.1 s/co ratio (ref 0.0–0.9)

## 2020-02-21 NOTE — Progress Notes (Deleted)
    SUBJECTIVE:   CHIEF COMPLAINT / HPI: f/u MDD, HTN  HTN: BP at home ***. Asymptomatic. Recently started Hyzaar 50-12.5, today BP at ***  MDD: Started lexapro 10 mg 2 weeks ago. PHQ9 today ***. She has contacted ***  PERTINENT  PMH / PSH: ***  OBJECTIVE:   There were no vitals taken for this visit.  Nursing note and vitals reviewed GEN: *** resting comfortably in chair, NAD, WNWD HEENT: NCAT. PERRLA. Sclera without injection or icterus. MMM.  Neck: Supple.  Cardiac: Regular rate and rhythm. Normal S1/S2. No murmurs, rubs, or gallops appreciated. 2+ radial pulses. Lungs: Clear bilaterally to ascultation. No increased WOB, no accessory muscle usage. No w/r/r. Abdomen: Normoactive bowel sounds. No tenderness to deep or light palpation. No rebound or guarding.  ***  Neuro: AOx3 *** Ext: no edema Psych: Pleasant and appropriate     ASSESSMENT/PLAN:   No problem-specific Assessment & Plan notes found for this encounter.     Shirlean Mylar, MD [facility]

## 2020-02-22 ENCOUNTER — Ambulatory Visit: Payer: Medicaid Other | Admitting: Family Medicine

## 2020-03-03 ENCOUNTER — Other Ambulatory Visit: Payer: Self-pay | Admitting: Family Medicine

## 2020-03-03 DIAGNOSIS — F333 Major depressive disorder, recurrent, severe with psychotic symptoms: Secondary | ICD-10-CM

## 2020-03-03 NOTE — Telephone Encounter (Signed)
Patient calls nurse line regarding being out of Lexapro. Due to e-prescribing system issues, rx will need to be called into pharmacy.   Veronda Prude, RN

## 2020-03-19 ENCOUNTER — Ambulatory Visit: Payer: Medicaid Other

## 2020-03-19 NOTE — Progress Notes (Deleted)
     SUBJECTIVE:   CHIEF COMPLAINT / HPI:   Kendra Harris is a 40 y.o. female presents with ***   Back Pain Onset: *** Location/Radiation: *** Duration: ***  Character: ***  Aggravating Factors: ***  Alleviating Factors: ***  Timing: *** Severity: ***  Denies *** history of trauma, history of prolonged steroid use, bowel or bladder incontinence, urinary retention, numbness or tingling in extremities or saddle anesthesia, weakness in extremities, fevers, chills, IV drug use, hemodialysis, hx cancer, changes in weight, night sweats. No history of osteoporosis ***.  No history of prostate cancer ***.   Occupation: ***No heavy lifting, repetitive vibrations, bending or twisting motions.  PERTINENT  PMH / PSH: htn, migraine, uti   OBJECTIVE:   There were no vitals taken for this visit.   General: Alert, no acute distress Cardio: Normal S1 and S2, RRR, no r/m/g Pulm: CTAB, normal work of breathing Abdomen: Bowel sounds normal. Abdomen soft and non-tender.  Extremities: No peripheral edema.  Neuro: Cranial nerves grossly intact  Back Normal skin, Spine with normal alignment and no deformity.  No tenderness to vertebral process palpation.  Paraspinous muscles are not tender and without spasm.   Range of motion is full at neck and lumbar sacral regions. Negative straight leg test. Strength   ASSESSMENT/PLAN:   No problem-specific Assessment & Plan notes found for this encounter.     Towanda Octave, MD PGY-2 [facility]

## 2020-03-24 ENCOUNTER — Ambulatory Visit: Payer: Medicaid Other | Admitting: Family Medicine

## 2020-07-01 ENCOUNTER — Ambulatory Visit: Payer: Medicaid Other | Admitting: Family Medicine

## 2020-07-08 ENCOUNTER — Ambulatory Visit: Payer: Medicaid Other | Admitting: Family Medicine

## 2020-07-08 NOTE — Progress Notes (Deleted)
    SUBJECTIVE:   CHIEF COMPLAINT / HPI:   *** Last time she was here caitlin took her off amlodipine and put her on hyzar.    headache  Appears up to date on everything.   PERTINENT  PMH / PSH: ***  OBJECTIVE:   There were no vitals taken for this visit.  ***  ASSESSMENT/PLAN:   No problem-specific Assessment & Plan notes found for this encounter.     Sandre [place], MD [facility]   {    This will disappear when note is signed, click to select method of visit    :1}

## 2020-07-23 ENCOUNTER — Other Ambulatory Visit: Payer: Self-pay | Admitting: Family Medicine

## 2020-07-23 DIAGNOSIS — F333 Major depressive disorder, recurrent, severe with psychotic symptoms: Secondary | ICD-10-CM

## 2020-07-28 ENCOUNTER — Telehealth: Payer: Self-pay

## 2020-07-28 NOTE — Telephone Encounter (Signed)
Received fax from pharmacy regarding PA for Escitalopram. Quantity limit exceeded. Please advise if patient's dose can be increased to 20 mg, as insurance will not continue to cover 2, 10 mg tablets.   Veronda Prude, RN

## 2020-07-29 ENCOUNTER — Other Ambulatory Visit: Payer: Self-pay | Admitting: Family Medicine

## 2020-07-29 DIAGNOSIS — F333 Major depressive disorder, recurrent, severe with psychotic symptoms: Secondary | ICD-10-CM

## 2020-07-29 MED ORDER — ESCITALOPRAM OXALATE 20 MG PO TABS: 20.0000 mg | ORAL_TABLET | Freq: Every day | ORAL | 1 refills | Status: DC

## 2020-07-29 NOTE — Telephone Encounter (Signed)
I changed it to 20mg 

## 2020-09-15 ENCOUNTER — Ambulatory Visit (HOSPITAL_COMMUNITY): Payer: Self-pay

## 2020-09-17 ENCOUNTER — Ambulatory Visit (HOSPITAL_COMMUNITY): Admit: 2020-09-17 | Discharge status: Against Medical Advice | Payer: Medicaid Other

## 2020-09-17 ENCOUNTER — Ambulatory Visit: Payer: Medicaid Other | Admitting: Pharmacist

## 2020-09-18 ENCOUNTER — Ambulatory Visit (HOSPITAL_COMMUNITY): Payer: Self-pay

## 2020-11-15 ENCOUNTER — Other Ambulatory Visit: Payer: Self-pay | Admitting: Family Medicine

## 2020-11-15 DIAGNOSIS — I10 Essential (primary) hypertension: Secondary | ICD-10-CM

## 2020-11-18 ENCOUNTER — Encounter: Payer: Self-pay | Admitting: Family Medicine

## 2020-11-18 ENCOUNTER — Ambulatory Visit: Payer: Medicaid Other | Admitting: Family Medicine

## 2020-11-18 ENCOUNTER — Ambulatory Visit (INDEPENDENT_AMBULATORY_CARE_PROVIDER_SITE_OTHER): Payer: Medicaid Other | Admitting: Family Medicine

## 2020-11-18 ENCOUNTER — Other Ambulatory Visit: Payer: Self-pay

## 2020-11-18 VITALS — BP 189/106 | HR 56 | Wt 221.4 lb

## 2020-11-18 DIAGNOSIS — M79672 Pain in left foot: Secondary | ICD-10-CM | POA: Diagnosis not present

## 2020-11-18 DIAGNOSIS — I10 Essential (primary) hypertension: Secondary | ICD-10-CM | POA: Diagnosis not present

## 2020-11-18 MED ORDER — LOSARTAN POTASSIUM-HCTZ 100-12.5 MG PO TABS: 1.0000 | ORAL_TABLET | Freq: Every day | ORAL | 0 refills | Status: DC

## 2020-11-18 NOTE — Progress Notes (Signed)
    SUBJECTIVE:   CHIEF COMPLAINT / HPI:   Kendra Harris presents for foot pain.   Foot pain - Pain started 2-3 days ago over her L lateral foot. It has not been red or itchy. She has not tried anything to relieve the pain and is not sure if anything worsens it. She works for 8 hours on her feet, which she feels is making it worse. Of note, she started wearing new shoes 3 days prior to her foot starting to hurt, which she admits is slightly tight. She denies any trauma or hurting her feet recently. She denies any fever, nausea or vomiting.  Hypertension - Blood pressure is significantly elevated at 189/106 in office today. Repeat blood pressure was also 189/106. Patient takes losartan-HCTZ 50mg -12.5mg  daily. Stopped amlodipine last December due to side effects of headaches. Does not check blood pressure at home.   OBJECTIVE:   BP (!) 189/106   Pulse (!) 56   Wt 221 lb 6.4 oz (100.4 kg)   SpO2 100%   BMI 40.49 kg/m   GEN: Well appearing, in no acute distress CARD: Normal rate and rhythm, no murmurs or rubs PULM: Clear to auscultation, normal work of breathing GI: No distention EXTREMITIES: No cyanosis. Left foot has area of mild swelling and very mild tenderness. Swelling is in midfoot on the lateral edge. No erythema, no itching. Mild tenderness to palpation directly on the affected area. No tenderness to palpation on plantar surface of foot or in areas next to the area of swelling. No fluctuance. Full ROM of ankle, no instability, full strength with plantarflexion/dorsiflexion/inversion/eversion.  ASSESSMENT/PLAN:   Essential hypertension, benign Elevated BP of 189/106 while taking losartan-HCTZ 50mg -12.5mg  daily. Increasing dose to losartan-HCTZ 100mg -12.5mg  daily. Sent prescription to pharmacy Obtaining BMP to evaluate kidney function today. RTC for visit with PCP Dr. January in 2 weeks. Will reassess BP and obtain repeat BMP to reassess kidney function after dose change.  Foot pain,  left Suspect tenderness and pain is due to new shoes. Possibly due to different gait or pressure. Lack of recent trauma makes fracture very unlikely. Discussed options for workup including xray. After discussion and shared decision making, elected to treat conservatively and if pain continues, will consider x-ray to rule out fracture. Recommend patient return to her old shoes to give her feet some rest. Take tylenol and ice as needed (verbally discussed with patient to avoid ibuprofen at this time due to elevated blood pressure despite ibuprofen written on AVS) Patient is comfortable with more conservative plan given low likelihood of fracture.   , Medical Student [facility]   Patient seen along with MS3 student . I personally evaluated this patient along with the student, and verified all aspects of the history, physical exam, and medical decision making as documented by the student. I agree with the student's documentation and have made all necessary edits.  Wynelle Link, MD  [facility]

## 2020-11-18 NOTE — Assessment & Plan Note (Addendum)
Suspect tenderness and pain is due to new shoes. Possibly due to different gait or pressure. Lack of recent trauma makes fracture very unlikely. Discussed options for workup including xray. After discussion and shared decision making, elected to treat conservatively and if pain continues, will consider x-ray to rule out fracture. Recommend patient return to her old shoes to give her feet some rest. Take tylenol and ice as needed (verbally discussed with patient to avoid ibuprofen at this time due to elevated blood pressure despite ibuprofen written on AVS) Patient is comfortable with more conservative plan given low likelihood of fracture.

## 2020-11-18 NOTE — Assessment & Plan Note (Signed)
Elevated BP of 189/106 while taking losartan-HCTZ 50mg -12.5mg  daily. Increasing dose to losartan-HCTZ 100mg -12.5mg  daily. Sent prescription to pharmacy Obtaining BMP to evaluate kidney function today. RTC for visit with PCP Dr. in 2 weeks. Will reassess BP and obtain repeat BMP to reassess kidney function after dose change.

## 2020-11-18 NOTE — Patient Instructions (Addendum)
It was a pleasure seeing you today!  We suspect your foot pain is due to the shoes. We recommend going back to your old shoes and using over the counter ibuprofen to help relieve the pain. You can also try ice to relieve the inflammation.  If the pain does not resolve over the next few days with ibuprofen and ice, please let us know so we can order an x-ray.  We are sending a new prescription for your blood pressure, with a higher dose of one of the components (losartan). Checking kidney function today Schedule a follow up visit in 2 weeks with Dr. Wynelle Link to recheck blood pressure and recheck kidney function again  Be Well, Dr. Pollie Meyer and Lequita Halt

## 2020-11-19 LAB — BASIC METABOLIC PANEL
BUN/Creatinine Ratio: 12 (ref 9–23)
BUN: 10 mg/dL (ref 6–24)
CO2: 25 mmol/L (ref 20–29)
Calcium: 9 mg/dL (ref 8.7–10.2)
Chloride: 101 mmol/L (ref 96–106)
Creatinine, Ser: 0.84 mg/dL (ref 0.57–1.00)
Glucose: 85 mg/dL (ref 70–99)
Potassium: 4 mmol/L (ref 3.5–5.2)
Sodium: 137 mmol/L (ref 134–144)
eGFR: 89 mL/min/{1.73_m2} (ref >59)

## 2020-11-21 ENCOUNTER — Telehealth: Payer: Self-pay

## 2020-11-21 NOTE — Telephone Encounter (Signed)
Agree, normal results Only other thing is to advise patient again to schedule follow up visit for blood pressure check in 2 weeks. She has not scheduled this yet, it appears.  Thank you!  Latrelle Dodrill, MD

## 2020-11-21 NOTE — Telephone Encounter (Signed)
Patient calls nurse line regarding results from visit on 11/18/2020. Advised of normal results.   Please advise if additional information needs to be provided to patient.   Veronda Prude, RN

## 2020-12-10 ENCOUNTER — Ambulatory Visit: Payer: Medicaid Other | Admitting: Family Medicine

## 2020-12-21 ENCOUNTER — Other Ambulatory Visit: Payer: Self-pay | Admitting: Family Medicine

## 2020-12-22 ENCOUNTER — Other Ambulatory Visit: Payer: Self-pay | Admitting: Family Medicine

## 2020-12-24 ENCOUNTER — Other Ambulatory Visit: Payer: Self-pay | Admitting: Family Medicine

## 2020-12-31 ENCOUNTER — Ambulatory Visit: Payer: Medicaid Other

## 2021-01-07 ENCOUNTER — Ambulatory Visit: Payer: Medicaid Other

## 2021-02-03 ENCOUNTER — Ambulatory Visit
Admission: EM | Admit: 2021-02-03 | Discharge: 2021-02-03 | Payer: Medicaid Other | Attending: Internal Medicine | Admitting: Internal Medicine

## 2021-02-03 ENCOUNTER — Other Ambulatory Visit: Payer: Self-pay

## 2021-02-03 DIAGNOSIS — R0789 Other chest pain: Secondary | ICD-10-CM

## 2021-02-03 NOTE — ED Provider Notes (Signed)
[facility]    CSN: [id number] Arrival date & time: 02/03/21  1704      History   Chief Complaint Chief Complaint  Patient presents with   Gastroesophageal Reflux    HPI Kendra Harris is a 40 y.o. female.   Patient presents with chest pain that started yesterday.  Chest pain is located in the mid chest and does not radiate.  Pain is described as a "burning sensation".  Pain is constant.  Denies any associated shortness of breath.  Patient reports that the pain worsened today upon bending over.  Denies any other aggravating or relieving factors.  Patient has not taken any medications to help alleviate pain.  Denies any upper respiratory symptoms, cough, fever.  Patient also has elevated blood pressure reading today.  She was seen approximately 2 months ago at PCP and dosage of blood pressure medication was increased.  Patient has been taking this as prescribed.  She does not report that she takes her blood pressure at home.  Denies headache, nausea, vomiting, diarrhea, blurred vision, dizziness.   Gastroesophageal Reflux   Past Medical History:  Diagnosis Date   DELAYED MENSES 02/11/2010   Qualifier: Diagnosis of  By: Wallene Huh  MD, Khary     Gestational diabetes    Hypertension    Urinary tract infection 03/04/2011    Patient Active Problem List   Diagnosis Date Noted   Foot pain, left 11/18/2020   Healthcare maintenance 02/05/2020   Infected tooth 08/20/2019   Cervicalgia 07/01/2019   Tension headache 06/19/2019   Anemia 04/11/2019   Major depressive disorder, recurrent episode, severe, with psychotic behavior (HCC) 08/14/2018   Cervical cancer screening 08/09/2018   UTI (urinary tract infection) 07/27/2013   Menorrhagia 04/27/2013   Severe major depression without psychotic features (HCC) 07/24/2010   MIGRAINE HEADACHE 12/13/2007   Essential hypertension, benign 08/31/2007   Morbid obesity with BMI of 45.0-49.9, adult (HCC) 04/07/2006    Past Surgical  History:  Procedure Laterality Date   NO PAST SURGERIES      OB History     Gravida  5   Para  5   Term  5   Preterm  0   AB  0   Living  5      SAB  0   IAB  0   Ectopic  0   Multiple  0   Live Births  5            Home Medications    Prior to Admission medications   Medication Sig Start Date End Date Taking? Authorizing Provider  cyclobenzaprine (FLEXERIL) 10 MG tablet TAKE 1 TABLET(10 MG) BY MOUTH THREE TIMES DAILY AS NEEDED FOR MUSCLE SPASMS 10/01/19   Sandre Kitty, MD  diclofenac Sodium (VOLTAREN) 1 % GEL Apply 4 g topically 4 (four) times daily. Patient not taking: Reported on 11/18/2020 06/28/19   Shirley, [place], DO  escitalopram (LEXAPRO) 20 MG tablet TAKE 1 TABLET(20 MG) BY MOUTH DAILY 11/17/20   Littie Deeds, MD  losartan-hydrochlorothiazide [facility]) 100-12.5 MG tablet TAKE 1 TABLET BY MOUTH DAILY 12/25/20   Littie Deeds, MD  Prenatal Vit-Fe Fumarate-FA (PRENATAL MULTIVITAMIN) TABS tablet Take 1 tablet by mouth daily. Fill as Shyrl Numbers HR is covered 08/21/18   Malvin Johns, MD    Family History Family History  Problem Relation Age of Onset   Sickle cell trait Sister     Social History Social History   Tobacco Use   Smoking status: Never  Smokeless tobacco: Never  Vaping Use   Vaping Use: Never used  Substance Use Topics   Alcohol use: No   Drug use: No     Allergies   Patient has no known allergies.   Review of Systems Review of Systems Per HPI  Physical Exam Triage Vital Signs ED Triage Vitals  Enc Vitals Group     BP 02/03/21 1831 (!) 183/102     Pulse Rate 02/03/21 1831 (!) 54     Resp 02/03/21 1831 18     Temp 02/03/21 1831 98.2 F (36.8 C)     Temp Source 02/03/21 1831 Oral     SpO2 02/03/21 1831 98 %     Weight --      Height --      Head Circumference --      Peak Flow --      Pain Score 02/03/21 1828 4     Pain Loc --      Pain Edu? --      Excl. in GC? --    No data found.  Updated Vital Signs BP  (!) 183/102 (BP Location: Left Arm)    Pulse (!) 54    Temp 98.2 F (36.8 C) (Oral)    Resp 18    LMP 01/20/2021 (Approximate)    SpO2 98%   Visual Acuity Right Eye Distance:   Left Eye Distance:   Bilateral Distance:    Right Eye Near:   Left Eye Near:    Bilateral Near:     Physical Exam Constitutional:      General: She is not in acute distress.    Appearance: Normal appearance. She is not toxic-appearing or diaphoretic.  HENT:     Head: Normocephalic and atraumatic.  Eyes:     Extraocular Movements: Extraocular movements intact.     Conjunctiva/sclera: Conjunctivae normal.  Cardiovascular:     Rate and Rhythm: Normal rate and regular rhythm.     Pulses: Normal pulses.     Heart sounds: Normal heart sounds.  Pulmonary:     Effort: Pulmonary effort is normal. No respiratory distress.     Breath sounds: Normal breath sounds. No stridor. No wheezing, rhonchi or rales.  Chest:     Chest wall: No tenderness.  Neurological:     General: No focal deficit present.     Mental Status: She is alert and oriented to person, place, and time. Mental status is at baseline.  Psychiatric:        Mood and Affect: Mood normal.        Behavior: Behavior normal.        Thought Content: Thought content normal.        Judgment: Judgment normal.     UC Treatments / Results  Labs (all labs ordered are listed, but only abnormal results are displayed) Labs Reviewed  POCT URINALYSIS DIP (MANUAL ENTRY)  POCT URINE PREGNANCY    EKG   Radiology No results found.  Procedures Procedures (including critical care time)  Medications Ordered in UC Medications - No data to display  Initial Impression / Assessment and Plan / UC Course  I have reviewed the triage vital signs and the nursing notes.  Pertinent labs & imaging results that were available during my care of the patient were reviewed by me and considered in my medical decision making (see chart for details).     EKG was fairly  unremarkable shows sinus bradycardia.  Highly suspicious that this chest pain  is gastroesophageal reflux.  Although, I do think think that patient needs further evaluation to rule out cardiac or lung etiology especially with significantly elevated blood pressure. Unable to provide this further work-up at urgent care.  Patient will need to go to the hospital for further evaluation and management.  Do not think that patient needs EMS transport but patient does not currently have any transportation to the hospital.  Patient left via EMS. Final Clinical Impressions(s) / UC Diagnoses   Final diagnoses:  Other chest pain     Discharge Instructions      Patient sent to the hospital via EMS.    ED Prescriptions   None    PDMP not reviewed this encounter.   Gustavus Bryant, [place] 02/03/21 [phone]

## 2021-02-03 NOTE — ED Notes (Signed)
Patient is being discharged from the Urgent Care and sent to the Emergency Department via EMS . Per [place], [place], patient is in need of higher level of care due to chest pain needing further work-up. Patient is aware and verbalizes understanding of plan of care.  Vitals:   02/03/21 1831  BP: (!) 183/102  Pulse: (!) 54  Resp: 18  Temp: 98.2 F (36.8 C)  SpO2: 98%

## 2021-02-03 NOTE — Discharge Instructions (Signed)
Patient sent to the hospital via EMS. 

## 2021-02-03 NOTE — ED Notes (Signed)
EMS called

## 2021-02-03 NOTE — ED Triage Notes (Signed)
Patient presents to Urgent Care with complaints of acid reflux sensation at the center of her chest area since yesterday. She states she has a hx of acid reflux. She states earlier today the pain was worse when lifting something off the floor.   Denies SOB.

## 2021-02-04 ENCOUNTER — Emergency Department (HOSPITAL_COMMUNITY)
Admission: EM | Admit: 2021-02-04 | Discharge: 2021-02-04 | Disposition: A | Discharge status: Home / Self Care | Payer: Medicaid Other | Attending: Emergency Medicine | Admitting: Emergency Medicine

## 2021-02-04 ENCOUNTER — Ambulatory Visit: Payer: Medicaid Other | Admitting: Student

## 2021-02-04 ENCOUNTER — Emergency Department (HOSPITAL_COMMUNITY): Payer: Medicaid Other

## 2021-02-04 ENCOUNTER — Encounter (HOSPITAL_COMMUNITY): Payer: Self-pay | Admitting: *Deleted

## 2021-02-04 DIAGNOSIS — I10 Essential (primary) hypertension: Secondary | ICD-10-CM | POA: Diagnosis not present

## 2021-02-04 DIAGNOSIS — R001 Bradycardia, unspecified: Secondary | ICD-10-CM | POA: Insufficient documentation

## 2021-02-04 DIAGNOSIS — K2100 Gastro-esophageal reflux disease with esophagitis, without bleeding: Secondary | ICD-10-CM | POA: Diagnosis not present

## 2021-02-04 DIAGNOSIS — E876 Hypokalemia: Secondary | ICD-10-CM | POA: Diagnosis not present

## 2021-02-04 DIAGNOSIS — Z79899 Other long term (current) drug therapy: Secondary | ICD-10-CM | POA: Diagnosis not present

## 2021-02-04 DIAGNOSIS — R0789 Other chest pain: Secondary | ICD-10-CM | POA: Diagnosis present

## 2021-02-04 LAB — CBC
HCT: 35.9 % — ABNORMAL LOW (ref 36.0–46.0)
Hemoglobin: 11.2 g/dL — ABNORMAL LOW (ref 12.0–15.0)
MCH: 25.6 pg — ABNORMAL LOW (ref 26.0–34.0)
MCHC: 31.2 g/dL (ref 30.0–36.0)
MCV: 82 fL (ref 80.0–100.0)
Platelets: 286 10*3/uL (ref 150–400)
RBC: 4.38 MIL/uL (ref 3.87–5.11)
RDW: 14.9 % (ref 11.5–15.5)
WBC: 5.1 10*3/uL (ref 4.0–10.5)
nRBC: 0 % (ref 0.0–0.2)

## 2021-02-04 LAB — HEPATIC FUNCTION PANEL
ALT: 12 U/L (ref 0–44)
AST: 15 U/L (ref 15–41)
Albumin: 3.6 g/dL (ref 3.5–5.0)
Alkaline Phosphatase: 66 U/L (ref 38–126)
Bilirubin, Direct: <0.1 mg/dL (ref 0.0–0.2)
Total Bilirubin: 0.9 mg/dL (ref 0.3–1.2)
Total Protein: 7.6 g/dL (ref 6.5–8.1)

## 2021-02-04 LAB — BASIC METABOLIC PANEL
Anion gap: 9 (ref 5–15)
BUN: 8 mg/dL (ref 6–20)
CO2: 26 mmol/L (ref 22–32)
Calcium: 8.8 mg/dL — ABNORMAL LOW (ref 8.9–10.3)
Chloride: 101 mmol/L (ref 98–111)
Creatinine, Ser: 0.87 mg/dL (ref 0.44–1.00)
GFR, Estimated: >60 mL/min (ref >60)
Glucose, Bld: 105 mg/dL — ABNORMAL HIGH (ref 70–99)
Potassium: 2.7 mmol/L — CL (ref 3.5–5.1)
Sodium: 136 mmol/L (ref 135–145)

## 2021-02-04 LAB — MAGNESIUM: Magnesium: 1.6 mg/dL — ABNORMAL LOW (ref 1.7–2.4)

## 2021-02-04 LAB — LIPASE, BLOOD: Lipase: 25 U/L (ref 11–51)

## 2021-02-04 LAB — I-STAT BETA HCG BLOOD, ED (MC, WL, AP ONLY): I-stat hCG, quantitative: <5 m[IU]/mL (ref <5)

## 2021-02-04 LAB — TROPONIN I (HIGH SENSITIVITY): Troponin I (High Sensitivity): 4 ng/L (ref <18)

## 2021-02-04 MED ORDER — LOSARTAN POTASSIUM 100 MG PO TABS: 100.0000 mg | ORAL_TABLET | Freq: Every day | ORAL | 2 refills | Status: DC

## 2021-02-04 MED ORDER — ALUM & MAG HYDROXIDE-SIMETH 200-200-20 MG/5ML PO SUSP
30.0000 mL | Freq: Once | ORAL | Status: AC
  Administered 2021-02-04: 11:00:00 30 mL via ORAL
  Filled 2021-02-04: qty 30

## 2021-02-04 MED ORDER — HYDRALAZINE HCL 25 MG PO TABS
50.0000 mg | ORAL_TABLET | Freq: Once | ORAL | Status: AC
  Administered 2021-02-04: 11:00:00 50 mg via ORAL
  Filled 2021-02-04: qty 2

## 2021-02-04 MED ORDER — LIDOCAINE VISCOUS HCL 2 % MT SOLN
15.0000 mL | Freq: Once | OROMUCOSAL | Status: AC
  Administered 2021-02-04: 11:00:00 15 mL via ORAL
  Filled 2021-02-04: qty 15

## 2021-02-04 MED ORDER — MAGNESIUM OXIDE -MG SUPPLEMENT 400 (240 MG) MG PO TABS
400.0000 mg | ORAL_TABLET | Freq: Every day | ORAL | Status: DC
  Administered 2021-02-04: 12:00:00 400 mg via ORAL
  Filled 2021-02-04: qty 1

## 2021-02-04 MED ORDER — MAGNESIUM OXIDE 400 MG PO CAPS: 400.0000 mg | ORAL_CAPSULE | Freq: Every day | ORAL | 0 refills | Status: DC

## 2021-02-04 MED ORDER — POTASSIUM CHLORIDE CRYS ER 20 MEQ PO TBCR
40.0000 meq | EXTENDED_RELEASE_TABLET | Freq: Once | ORAL | Status: AC
  Administered 2021-02-04: 11:00:00 40 meq via ORAL
  Filled 2021-02-04: qty 2

## 2021-02-04 MED ORDER — FAMOTIDINE 20 MG PO TABS
20.0000 mg | ORAL_TABLET | Freq: Once | ORAL | Status: AC
  Administered 2021-02-04: 11:00:00 20 mg via ORAL
  Filled 2021-02-04: qty 1

## 2021-02-04 MED ORDER — POTASSIUM CHLORIDE ER 10 MEQ PO TBCR: 20.0000 meq | EXTENDED_RELEASE_TABLET | Freq: Every day | ORAL | 0 refills | Status: DC

## 2021-02-04 MED ORDER — AMLODIPINE BESYLATE 5 MG PO TABS: 5.0000 mg | ORAL_TABLET | Freq: Every day | ORAL | 2 refills | Status: DC

## 2021-02-04 MED ORDER — FAMOTIDINE 20 MG PO TABS: 20.0000 mg | ORAL_TABLET | Freq: Two times a day (BID) | ORAL | 2 refills | Status: DC

## 2021-02-04 NOTE — Discharge Instructions (Addendum)
For your blood pressure and low potassium levels we made some changes to your meds.  You will STOP your current medicine (Losartan-HCTZ), because the HCTZ part is lowering your potassium levels.  You will START Losartan 100 mg daily, and you will also START Amlodipine 5 mg daily. (Amlodipine use can cause mild headaches early on with many patients; these tend to get better and disappear over time.)  You will START potassium and magnesium pills, once daily as instructed, for the next 15 days.    Your family medicine clinic should arrange for follow up in 1 week to recheck your blood pressure and potassium and magnesium levels.\  *  Finally, I prescribed pepcid for likely reflux.  Take this twice daily, 30 minutes before breakfast and dinner.  Try not to eat 3-4 hours before bedtime.  Review the recommendations included for foods to avoid for reflux issues.

## 2021-02-04 NOTE — ED Notes (Signed)
Pt ambulatory to waiting room. Pt verbalized understanding of discharge instructions.   

## 2021-02-04 NOTE — ED Notes (Signed)
Got patient into a gown on the monitor got patient a warm blanket patient is resting with call n=bell in reach

## 2021-02-04 NOTE — ED Triage Notes (Signed)
To ED for eval of midsternal cp since Monday. States she also has pain in her abd and is burping acid. Nothing makes pain worse or better. Appears in nad. Had used tums without relief

## 2021-02-04 NOTE — ED Provider Notes (Signed)
[facility] Provider Note   CSN: [id number] Arrival date & time: 02/04/21  [id number]     History Chief Complaint  Patient presents with   Chest Pain    Kendra Harris is a 40 y.o. female w/ hx of reflux, HTN (on losartan-hctz 100-25 mg) presenting to the ED with chest discomfort and HTN.  Patient reports that she has longstanding reflux, felt that she was having a reflux episode for the past few days, with belching and epigastric burning discomfort.  She also had midsternal chest discomfort which she said felt like her reflux.  She had formerly been on Zantac, but off of that for a few years because reflux improved.  She reports she has been trying to lose weight and has been succeeding in doing so.  She denies any vomiting, diarrhea, constipation, or history of abdominal surgery.  She went to an urgent care yesterday, she was noted to be significantly hypertensive.  She was advised to come to the ED but was not able to do so.  She returns today for follow-up on that initial visit.  She reports he continues to have some chest discomfort and belching.  She has regular bowel movements and is passing gas.  She denies history of MI or coronary disease.  She reports she does have intermittent headaches when her blood pressure spikes, currently has mild 1, no blurred vision or stroke symptoms.  She states her PCP has been working with her to manage her blood pressure.  She was started on losartan HCTZ a few weeks ago, reports compliance with the medication, including this morning.  She does not take any other blood pressure medicine.  In the past she was tried on amlodipine but it caused headaches and this was discontinued.  She does report a significant family history of hypertension nearly all of her family members.  She denies history of smoking, drinking alcohol.  HPI     Past Medical History:  Diagnosis Date   DELAYED MENSES 02/11/2010   Qualifier:  Diagnosis of  By: Wallene Huharew  MD, Khary     Hypertension    Urinary tract infection 03/04/2011    Patient Active Problem List   Diagnosis Date Noted   Foot pain, left 11/18/2020   Healthcare maintenance 02/05/2020   Infected tooth 08/20/2019   Cervicalgia 07/01/2019   Tension headache 06/19/2019   Anemia 04/11/2019   Major depressive disorder, recurrent episode, severe, with psychotic behavior (HCC) 08/14/2018   Cervical cancer screening 08/09/2018   UTI (urinary tract infection) 07/27/2013   Menorrhagia 04/27/2013   Severe major depression without psychotic features (HCC) 07/24/2010   MIGRAINE HEADACHE 12/13/2007   Essential hypertension, benign 08/31/2007   Morbid obesity with BMI of 45.0-49.9, adult (HCC) 04/07/2006    Past Surgical History:  Procedure Laterality Date   NO PAST SURGERIES       OB History     Gravida  5   Para  5   Term  5   Preterm  0   AB  0   Living  5      SAB  0   IAB  0   Ectopic  0   Multiple  0   Live Births  5           Family History  Problem Relation Age of Onset   Sickle cell trait Sister     Social History   Tobacco Use   Smoking status: Never  Smokeless tobacco: Never  Vaping Use   Vaping Use: Never used  Substance Use Topics   Alcohol use: No   Drug use: No    Home Medications Prior to Admission medications   Medication Sig Start Date End Date Taking? Authorizing Provider  acetaminophen (TYLENOL) 500 MG tablet Take 1,000 mg by mouth every 6 (six) hours as needed.   Yes [provider]  amLODipine (NORVASC) 5 MG tablet Take 1 tablet (5 mg total) by mouth daily for 30 doses. 02/04/21 03/06/21 Yes Niomie Englert, Carola Rhine, MD  escitalopram (LEXAPRO) 20 MG tablet TAKE 1 TABLET(20 MG) BY MOUTH DAILY Patient taking differently: Take 20 mg by mouth daily. 11/17/20  Yes Zola Button, MD  famotidine (PEPCID) 20 MG tablet Take 1 tablet (20 mg total) by mouth 2 (two) times daily. Take 30 minutes before breakfast  and dinner 02/04/21 03/06/21 Yes Coumba Kellison, Carola Rhine, MD  losartan (COZAAR) 100 MG tablet Take 1 tablet (100 mg total) by mouth daily for 30 doses. 02/04/21 03/06/21 Yes Wyvonnia Dusky, MD  losartan-hydrochlorothiazide [facility]) 100-12.5 MG tablet TAKE 1 TABLET BY MOUTH DAILY 12/25/20  Yes Zola Button, MD  Magnesium Oxide 400 MG CAPS Take 1 capsule (400 mg total) by mouth daily. 02/04/21  Yes Jayshaun Phillips, Carola Rhine, MD  potassium chloride (KLOR-CON) 10 MEQ tablet Take 2 tablets (20 mEq total) by mouth daily for 15 days. 02/04/21 02/19/21 Yes Dariyah Garduno, Carola Rhine, MD  diclofenac Sodium (VOLTAREN) 1 % GEL Apply 4 g topically 4 (four) times daily. Patient not taking: Reported on 11/18/2020 06/28/19   Shirley, [place], DO    Allergies    Patient has no known allergies.  Review of Systems   Review of Systems  Constitutional:  Negative for chills and fever.  HENT:  Negative for ear pain and sore throat.   Eyes:  Negative for pain and visual disturbance.  Respiratory:  Negative for cough and shortness of breath.   Cardiovascular:  Positive for chest pain. Negative for palpitations.  Gastrointestinal:  Positive for nausea. Negative for abdominal pain, constipation and vomiting.  Genitourinary:  Negative for dysuria and hematuria.  Musculoskeletal:  Negative for arthralgias and back pain.  Skin:  Negative for color change and rash.  Neurological:  Positive for headaches. Negative for syncope and light-headedness.  All other systems reviewed and are negative.  Physical Exam Updated Vital Signs BP (!) 185/99    Pulse 97    Temp 98.6 F (37 C) (Oral)    Resp 16    LMP 01/20/2021 (Approximate)    SpO2 100%   Physical Exam Constitutional:      General: She is not in acute distress.    Appearance: She is obese.  HENT:     Head: Normocephalic and atraumatic.  Eyes:     Conjunctiva/sclera: Conjunctivae normal.     Pupils: Pupils are equal, round, and reactive to light.  Cardiovascular:     Rate and  Rhythm: Normal rate and regular rhythm.  Pulmonary:     Effort: Pulmonary effort is normal. No respiratory distress.  Abdominal:     General: There is no distension.     Tenderness: There is no abdominal tenderness. There is no guarding or rebound. Negative signs include Murphy's sign and McBurney's sign.  Skin:    General: Skin is warm and dry.  Neurological:     General: No focal deficit present.     Mental Status: She is alert. Mental status is at baseline.  Psychiatric:  Mood and Affect: Mood normal.        Behavior: Behavior normal.    ED Results / Procedures / Treatments   Labs (all labs ordered are listed, but only abnormal results are displayed) Labs Reviewed  BASIC METABOLIC PANEL - Abnormal; Notable for the following components:      Result Value   Potassium 2.7 (*)    Glucose, Bld 105 (*)    Calcium 8.8 (*)    All other components within normal limits  CBC - Abnormal; Notable for the following components:   Hemoglobin 11.2 (*)    HCT 35.9 (*)    MCH 25.6 (*)    All other components within normal limits  MAGNESIUM - Abnormal; Notable for the following components:   Magnesium 1.6 (*)    All other components within normal limits  LIPASE, BLOOD  HEPATIC FUNCTION PANEL  I-STAT BETA HCG BLOOD, ED ([facility], [facility], [facility] ONLY)  TROPONIN I (HIGH SENSITIVITY)    EKG EKG Interpretation  Date/Time:  Wednesday February 04 2021 07:43:48 EST Ventricular Rate:  52 PR Interval:  206 QRS Duration: 90 QT Interval:  422 QTC Calculation: 392 R Axis:   52 Text Interpretation: Sinus bradycardia Otherwise normal ECG Confirmed by Alvester Chou [phone]) on 02/04/2021 9:37:08 AM  Radiology DG Chest 2 View  Result Date: 02/04/2021 CLINICAL DATA:  Chest pain. EXAM: CHEST - 2 VIEW COMPARISON:  06/25/2019 FINDINGS: Both lungs are clear. Mild curvature in the thoracic spine towards the right. Heart and mediastinum are within normal limits. Trachea is midline. No pleural effusions.  IMPRESSION: No active cardiopulmonary disease. Electronically Signed   By: Richarda Overlie M.D.   On: 02/04/2021 08:24    Procedures Procedures   Medications Ordered in ED Medications  magnesium oxide (MAG-OX) tablet 400 mg (400 mg Oral Given 02/04/21 1133)  alum & mag hydroxide-simeth (MAALOX/MYLANTA) 200-200-20 MG/5ML suspension 30 mL (30 mLs Oral Given 02/04/21 1032)    And  lidocaine (XYLOCAINE) 2 % viscous mouth solution 15 mL (15 mLs Oral Given 02/04/21 1032)  famotidine (PEPCID) tablet 20 mg (20 mg Oral Given 02/04/21 1032)  potassium chloride SA (KLOR-CON M) CR tablet 40 mEq (40 mEq Oral Given 02/04/21 1032)  hydrALAZINE (APRESOLINE) tablet 50 mg (50 mg Oral Given 02/04/21 1032)    ED Course  I have reviewed the triage vital signs and the nursing notes.  Pertinent labs & imaging results that were available during my care of the patient were reviewed by me and considered in my medical decision making (see chart for details).  Chest pain/discomfort - ddx includes reflux most likely given her hx and description of symptoms.  Will try GI cocktail and pepcid here -Her EKG today per my interpretation shows normal sinus rhythm without acute ischemic findings.  Her troponin is 4 with many days of symptoms.  Less likely PE or ACS.  No clinical signs of PNA.  Not consistent with aortic dissection. - Dg chest reviewed by myself, no focal PNA or PTX per my interpretation  2. Hypokalemia - suspect this is 2/2 HCTZ use.  She does not want IV potassium, although explained to her the persistent hypokalemia can lead to arrhythmias and even sudden cardiac death.  She prefers oral potassium only.  I do think it is reasonable to start her on oral regimen, we will be discontinuing the HCTZ, and she will need PCP to recheck this number this week.  3. Hypertension -Very likely familial component worsened today by reflux/discomfort.  She  is a non-smoker and has been losing weight. -Her chest discomfort is  more likely related to reflux.  Troponin is negative, doubt ACS. - She does have mild headache which may be related to hypertension, but no stroke symptoms or visual deficits to suggest hypertensive emergency.  Her kidney function is normal. - PO hydralazine given for persistent HTN here SBP 200 on recheck. - Will need to change HTN regimen for home. Clinical Course as of 02/04/21 1407  Wed Feb 04, 2021  1037 I spoke to [place] who agrees to discontinue HCTZ, continue losartan 100 mg and retry amlodipine 5 mg (advised pt that headaches are common early with this medicine and should improve with use).  Fam Med clinic will arrange for f/u this week for BP and K recheck. [MT]  1133 BP 174/84 - pt feels better, discussed low Mg as well as additional findings, meds discharged [MT]    Clinical Course User Index [MT] Dayveon Halley, Carola Rhine, MD    Final Clinical Impression(s) / ED Diagnoses Final diagnoses:  Gastroesophageal reflux disease with esophagitis without hemorrhage  Hypertension, unspecified type  Hypokalemia  Hypomagnesemia    Rx / DC Orders ED Discharge Orders          Ordered    potassium chloride (KLOR-CON) 10 MEQ tablet  Daily        02/04/21 1050    amLODipine (NORVASC) 5 MG tablet  Daily        02/04/21 1050    losartan (COZAAR) 100 MG tablet  Daily        02/04/21 1050    famotidine (PEPCID) 20 MG tablet  2 times daily        02/04/21 1050    Magnesium Oxide 400 MG CAPS  Daily        02/04/21 1124             Wyvonnia Dusky, MD 02/04/21 1407

## 2021-02-04 NOTE — Progress Notes (Signed)
°   02/04/21 1844  TOC ED Mini Assessment  TOC Time spent with patient (minutes): 15 (ED  Call Back)  PING Used in TOC Assessment No  Admission or Readmission Diverted No  Interventions which prevented an admission or readmission  (N/A)  Barrier interventions ED RNCM received call from patient concerning clarification ED discharge medication. Clarification provide. No further TOC needs identified

## 2021-02-05 ENCOUNTER — Telehealth: Payer: Self-pay

## 2021-02-05 NOTE — Telephone Encounter (Signed)
Patient calls nurse line reporting continued high blood pressure. Patient reports she was seen in the ED x2 over the last few days with readings of 185/99 and 183/99. Patient reports she was discharged home with Amlodipine and Losartan. Patient reports she has been taking both as prescribed. However, patient reports this morning her BP was 174/118. Patient denies any chest pains, SOB, vision changes or headaches. Patient has a FU scheduled with PCP on 1/4.   Patient advised to go back to urgent care or the ED. Patient reports she has been there twice now. Patient reports she will monitor her BP and if any of above symptoms occur she will report back to the ED.

## 2021-02-11 ENCOUNTER — Other Ambulatory Visit: Payer: Self-pay

## 2021-02-11 ENCOUNTER — Ambulatory Visit (INDEPENDENT_AMBULATORY_CARE_PROVIDER_SITE_OTHER): Payer: Medicaid Other | Admitting: Family Medicine

## 2021-02-11 ENCOUNTER — Encounter: Payer: Self-pay | Admitting: Family Medicine

## 2021-02-11 VITALS — BP 192/108 | HR 65 | Ht 62.0 in | Wt 199.4 lb

## 2021-02-11 DIAGNOSIS — E876 Hypokalemia: Secondary | ICD-10-CM

## 2021-02-11 DIAGNOSIS — I10 Essential (primary) hypertension: Secondary | ICD-10-CM | POA: Diagnosis present

## 2021-02-11 DIAGNOSIS — F322 Major depressive disorder, single episode, severe without psychotic features: Secondary | ICD-10-CM

## 2021-02-11 MED ORDER — AMLODIPINE BESYLATE 10 MG PO TABS
10.0000 mg | ORAL_TABLET | Freq: Every day | ORAL | 2 refills | Status: DC
Start: 2021-02-11 — End: 2021-05-05

## 2021-02-11 MED ORDER — ESCITALOPRAM OXALATE 20 MG PO TABS: 20.0000 mg | ORAL_TABLET | Freq: Every day | ORAL | 2 refills | Status: DC

## 2021-02-11 NOTE — Assessment & Plan Note (Signed)
Escitalopram refilled per patient request.  Therapy resources provided.  We will evaluate further at follow-up.

## 2021-02-11 NOTE — Patient Instructions (Signed)
It was nice seeing you today!  Go to the ED if you develop chest pain, trouble breathing.  Please arrive at least 15 minutes prior to your scheduled appointments.  Stay well, Littie Deeds, MD [facility] [phone]    Therapy and Counseling Resources Most providers on this list will take Medicaid. Patients with commercial insurance or Medicare should contact their insurance company to get a list of in network providers.  [facility]:Psychiatry and Counseling [address] [place], [place] [address] [phone]  [facility]  [address], Suite [place], [place] [address]      [phone]  [facility] [address]  [place], [place] [address] [phone]  [facility] [address]  [place], [place] [address]       [phone]     [facility] (virtual only) [phone]  Jovita Kussmaul [facility] [address] [place], [place] [phone]  [facility]:  [address] [place] [phone]  [facility]:  [address] [place] Diener [phone]  [facility] (under & uninsured) [address]   [place] [phone]    [email or web address]    [facility] [address]  [place]    [phone]  [facility] -[address]     Phone:  [phone]     [facility]-  [address]  [phone]   [facility] [address]      [place], [place] [phone]  [facility]: [address] [place], [place], [place]  [phone]   [facility] (Spanish therapist) [email or web address]  [address] [place]  [address]   [place] [address]    [phone]    [facility]   [address],  [place], [place]  [phone]   [facility]  [address] [place]  [phone]  [facility]  [address] [place], [place]        [phone]  Open Access/Walk In Clinic under & uninsured  [facility]  [address] [place], [place] [phone] Crisis [phone]  [facility] [place],  (Spanish)   [address], [place]: [phone]) 8:30 - 12; 1 - 2:30  [facility] [employer],  [address] [place], [place]    [phone]):8:30 - 12; 2 - 3PM  [facility] [employer],  [address],  [place]; [phone]):   Mon - Fri 8 AM - 5 PM  [facility] [address] [place]  MWF 12:30 to 3:00 or call to schedule an appointment  [phone]  Specific Provider options Psychology Today  [email or web address] click on find a therapist  enter your zip code left side and select or tailor a therapist for your specific need.   [facility] Provider Directory [email or web address]  (Medicaid)   Follow all drop down to find a provider  Social Support program [facility] [place] [phone] or PhotoS[employer].pl [address], [place], [place] Recovery support and educational   24- Hour Availability:   [facility]  [address] [place], [place] [phone] Crisis [phone]  [facility] [phone]  [facility]  [phone]   [facility]  Crisis Services  [phone] (after hours)  Therapeutic Alternative/Mobile Crisis   [phone]  [place] National Suicide Hotline  9516083850 Len Childs)  Call 911 or go to emergency room  [facility]  [phone]);  [place] and [employer]  [phone]); [place], [place], [place], [place], [place], [place], [place]

## 2021-02-11 NOTE — Progress Notes (Signed)
° ° °  SUBJECTIVE:   CHIEF COMPLAINT / HPI: f/u HTN  Recent urgent care visit 12/27 and subsequent ED visit on 12/28 for chest pain and severe HTN.  ACS work-up with troponin and EKG were unremarkable, CXR also unremarkable, ultimately felt to be secondary to GERD.  Also noted to be hypokalemic with potassium 2.7 thought to be secondary to HCTZ, also mildly low magnesium 1.6.  This was ultimately discontinued and she was started on oral potassium supplement and oral magnesium supplement.  She was continued on losartan 100 mg and restarted on amlodipine 5 mg (previously discontinued due to headache).  HTN As above, medication regimen was adjusted after recent ED visit, currently taking losartan 100 mg and amlodipine 5 mg. She has been taking the potassium and magnesium supplements as prescribed. Reports multiple elevated home readings at home 170-190/100s. Reports she currently has a headache which she believes is due to anxiety from elevated BP readings Denies chest pain, shortness of breath, vision changes, focal weakness  Depression Requesting refill of her escitalopram.  Does not see a therapist currently but is amenable to seeing one.  PERTINENT  PMH / PSH: HTN, depression, GERD  OBJECTIVE:   BP (!) 192/108    Pulse 65    Ht 5\' 2"  (1.575 m)    Wt 199 lb 6 oz (90.4 kg)    LMP 01/20/2021 (Approximate)    SpO2 100%    BMI 36.47 kg/m   General: Alert, NAD CV: RRR, 3/6 systolic murmur Pulm: CTAB, no wheezes or rales Abdomen: Soft, nontender Neuro: PERRL, EOMI, no facial droop or obvious focal deficits  Depression screen [facility] 2/9 02/11/2021  Decreased Interest 0  Down, Depressed, Hopeless 1  PHQ - 2 Score 1  Altered sleeping 3  Tired, decreased energy 3  Change in appetite 3  Feeling bad or failure about yourself  0  Trouble concentrating 2  Moving slowly or fidgety/restless 0  Suicidal thoughts 0  PHQ-9 Score 12  Difficult doing work/chores Somewhat difficult  Some recent data  might be hidden     ASSESSMENT/PLAN:   Visit today was cut short unfortunately, she was called during visit today that her son was being taken to the ED for asthma attack.  Essential hypertension, benign Severe HTN, poorly controlled.  Home readings correlate with severely elevated reading here.  Asymptomatic other than headache, no obvious signs of endorgan damage on exam.  We will continue losartan and increase amlodipine to 10 mg.  Will check BMP and magnesium, recent hypokalemia and hypomagnesemia currently on supplementation.  She was not able to stay for lab work so future orders placed for her to return later this week.  Recent hypokalemia (K 2.7) despite being on losartan raises suspicion for primary hyperaldosteronism, consider further work-up with early a.m. renin aldosterone level at follow-up if remains hypokalemic.  Severe major depression without psychotic features (HCC) Escitalopram refilled per patient request.  Therapy resources provided.  We will evaluate further at follow-up.  I called the patient to follow-up after visit today to discuss increase in amlodipine and for return for labs.  Advised to follow-up in the next 1 to 2 weeks.  She declined assistance in making appointment and will plan to call our office to set up follow-up appointment.  04/11/2021, MD [facility]

## 2021-02-11 NOTE — Assessment & Plan Note (Addendum)
Severe HTN, poorly controlled.  Home readings correlate with severely elevated reading here.  Asymptomatic other than headache, no obvious signs of endorgan damage on exam.  We will continue losartan and increase amlodipine to 10 mg.  Will check BMP and magnesium, recent hypokalemia and hypomagnesemia currently on supplementation.  She was not able to stay for lab work so future orders placed for her to return later this week.  Recent hypokalemia (K 2.7) despite being on losartan raises suspicion for primary hyperaldosteronism, consider further work-up with early a.m. renin aldosterone level at follow-up if remains hypokalemic.

## 2021-03-03 NOTE — Progress Notes (Signed)
° ° °  SUBJECTIVE:   CHIEF COMPLAINT / HPI:   Side and back pain-she notes 3 days ago about once per day having a twinge in her left side.  She notes she has picked up her niece and wondered if it was due to that.  This is only happened once a day and is not present now.  She has not been able to reproduce the pain by pushing or twisting on it.  She has not tried anything to make it better, she notes it is a dull pain that last for couple of minutes and then goes away on its own.  Nothing makes it worse.  She has noticed more frequent urination in the last 3 days, but she denies any dysuria, hematuria, urine smell or odor, change in urine color, vaginal discharge, suprapubic tenderness, abdominal pain.  She is sexually active, her last menstrual period was on January 21 and is now finished.  She notes this does feel like when she has had bladder infections in the past.  She denies a history of kidney stones.  She denies any numbness or weakness, bowel or bladder incontinence, saddle paresthesias.  Hypertension-at her last visit her amlodipine was increased to 10 mg.  She is also taking losartan 100 mg.  She has not come back for lab work since her last visit.  She is open to getting lab work today.  She denies any headaches, chest pain, vision changes.  He does have a family history of hypertension.  She denies any leg swelling or other side effects with her medications.  PERTINENT  PMH / PSH: HTN, obesity, anemia, depression   OBJECTIVE:   BP (!) 153/99    Pulse 63    Ht 5\' 2"  (1.575 m)    Wt 195 lb 12.8 oz (88.8 kg)    LMP 02/28/2021 (Approximate)    SpO2 100%    BMI 35.81 kg/m   General: A&O, NAD HEENT: No sign of trauma, EOM grossly intact Cardiac: RRR, no m/r/g Respiratory: CTAB, normal WOB, no w/c/r GI: Soft, NTTP, non-distended , N Extremities: NTTP, no peripheral edema. Neuro: Normal gait, moves all four extremities appropriately. Psych: Appropriate mood and affect   ASSESSMENT/PLAN:    Side pain - Suspect MSK strain, no CVA tenderness and urine without signs of infection or blood, discussed return precautions in her AVS, discussed as needed APAP or heat for pain  Essential hypertension, benign - Improved today, will add HCTZ 12.5 mg daily and recommend follow-up in 1 to 2 weeks to repeat blood pressure and lab work - We will check BMP today as this has not been checked since increasing her losartan  Hypokalemia - Noted on previous ED visit, she has finished potassium supplementation, will recheck today - With her previous hypokalemia and hypertension does make me wonder about aldosterone metabolism disorders, depending on lab work may consider referral to cardiology for secondary hypertension work-up     Lenoria Chime, MD [place]

## 2021-03-04 ENCOUNTER — Other Ambulatory Visit: Payer: Self-pay

## 2021-03-04 ENCOUNTER — Encounter: Payer: Self-pay | Admitting: Family Medicine

## 2021-03-04 ENCOUNTER — Ambulatory Visit (INDEPENDENT_AMBULATORY_CARE_PROVIDER_SITE_OTHER): Payer: Medicaid Other | Admitting: Family Medicine

## 2021-03-04 VITALS — BP 153/99 | HR 63 | Ht 62.0 in | Wt 195.8 lb

## 2021-03-04 DIAGNOSIS — R109 Unspecified abdominal pain: Secondary | ICD-10-CM | POA: Diagnosis not present

## 2021-03-04 DIAGNOSIS — E876 Hypokalemia: Secondary | ICD-10-CM | POA: Diagnosis not present

## 2021-03-04 DIAGNOSIS — I10 Essential (primary) hypertension: Secondary | ICD-10-CM | POA: Diagnosis not present

## 2021-03-04 LAB — POCT URINALYSIS DIP (MANUAL ENTRY)
Bilirubin, UA: NEGATIVE
Blood, UA: NEGATIVE
Glucose, UA: NEGATIVE mg/dL
Ketones, POC UA: NEGATIVE mg/dL
Leukocytes, UA: NEGATIVE
Nitrite, UA: NEGATIVE
Protein Ur, POC: NEGATIVE mg/dL
Spec Grav, UA: 1.02 (ref 1.010–1.025)
Urobilinogen, UA: 0.2 E.U./dL
pH, UA: 6.5 (ref 5.0–8.0)

## 2021-03-04 MED ORDER — HYDROCHLOROTHIAZIDE 25 MG PO TABS: 12.5000 mg | ORAL_TABLET | Freq: Every day | ORAL | 1 refills | Status: DC

## 2021-03-04 NOTE — Assessment & Plan Note (Signed)
-   Noted on previous ED visit, she has finished potassium supplementation, will recheck today - With her previous hypokalemia and hypertension does make me wonder about aldosterone metabolism disorders, depending on lab work may consider referral to cardiology for secondary hypertension work-up

## 2021-03-04 NOTE — Assessment & Plan Note (Signed)
-   Suspect MSK strain, no CVA tenderness and urine without signs of infection or blood, discussed return precautions in her AVS, discussed as needed APAP or heat for pain

## 2021-03-04 NOTE — Assessment & Plan Note (Signed)
-   Improved today, will add HCTZ 12.5 mg daily and recommend follow-up in 1 to 2 weeks to repeat blood pressure and lab work - We will check BMP today as this has not been checked since increasing her losartan

## 2021-03-04 NOTE — Patient Instructions (Signed)
It was wonderful to see you today.  Please bring ALL of your medications with you to every visit.   Today we talked about:  -Your blood pressure looks much better today estimation point we are adding a medication called hydrochlorothiazide12.5 mg which is one half tab daily -Please make a follow-up in 1 to 2 weeks to recheck your blood pressure and we will recheck labs at that time - We will recheck your lab work today to make sure potassium and magnesium are better - Your urine does not show any signs of infection, I suspect a muscle strain, you can use Tylenol and heat as needed - If you start having burning with urination, fever, chills, bowel or bladder incontinence, numbness in your private region, numbness or weakness in your legs, severe headache with vision changes, or chest pain please go to the ED   Thank you for choosing [facility].   Please call [phone] with any questions about today's appointment.  Please be sure to schedule follow up at the front  desk before you leave today.   Burley Saver, MD  Family Medicine

## 2021-03-05 LAB — BASIC METABOLIC PANEL
BUN/Creatinine Ratio: 16 (ref 9–23)
BUN: 14 mg/dL (ref 6–24)
CO2: 23 mmol/L (ref 20–29)
Calcium: 9.3 mg/dL (ref 8.7–10.2)
Chloride: 104 mmol/L (ref 96–106)
Creatinine, Ser: 0.85 mg/dL (ref 0.57–1.00)
Glucose: 89 mg/dL (ref 70–99)
Potassium: 4.2 mmol/L (ref 3.5–5.2)
Sodium: 139 mmol/L (ref 134–144)
eGFR: 88 mL/min/{1.73_m2} (ref >59)

## 2021-03-05 LAB — MAGNESIUM: Magnesium: 2 mg/dL (ref 1.6–2.3)

## 2021-03-19 ENCOUNTER — Telehealth: Payer: Self-pay

## 2021-03-19 DIAGNOSIS — I10 Essential (primary) hypertension: Secondary | ICD-10-CM

## 2021-03-19 MED ORDER — HYDROCHLOROTHIAZIDE 25 MG PO TABS: 25.0000 mg | ORAL_TABLET | Freq: Every day | ORAL | 1 refills | Status: DC

## 2021-03-19 NOTE — Telephone Encounter (Signed)
Patient calls nurse line requesting refill on HCTZ. Patient reports that she thought that she was supposed to be taking 1 tablet daily, however, instructions were written for 0.5 tablets daily.   Patient denies any symptoms at this time. BP has been running in the 160's over 90's.   Please advise how patient should proceed with this medication.   Veronda Prude, RN

## 2021-03-25 ENCOUNTER — Other Ambulatory Visit: Payer: Self-pay

## 2021-03-25 ENCOUNTER — Other Ambulatory Visit: Payer: Medicaid Other

## 2021-03-25 ENCOUNTER — Ambulatory Visit (INDEPENDENT_AMBULATORY_CARE_PROVIDER_SITE_OTHER): Payer: Medicaid Other

## 2021-03-25 VITALS — BP 134/82 | HR 72

## 2021-03-25 DIAGNOSIS — E876 Hypokalemia: Secondary | ICD-10-CM

## 2021-03-25 DIAGNOSIS — Z0130 Encounter for examination of blood pressure without abnormal findings: Secondary | ICD-10-CM

## 2021-03-25 NOTE — Progress Notes (Signed)
Patient here today for BP check.      Last BP was on 03/04/2021 and was 153/99.  BP today is 134/82 with a pulse of 72.    Checked BP in right arm with regular adult cuff.    Symptoms present: none.   Patient has been taking amlodipine 10 mg daily and HCTZ 25 mg daily. Patient also received lab work (BMP and Magnesium) at today's visit.   Patient has follow up visit with Dr. Leary Roca on Friday, 2/17.  Veronda Prude, RN

## 2021-03-26 LAB — BASIC METABOLIC PANEL
BUN/Creatinine Ratio: 18 (ref 9–23)
BUN: 16 mg/dL (ref 6–24)
CO2: 25 mmol/L (ref 20–29)
Calcium: 9.4 mg/dL (ref 8.7–10.2)
Chloride: 101 mmol/L (ref 96–106)
Creatinine, Ser: 0.89 mg/dL (ref 0.57–1.00)
Glucose: 99 mg/dL (ref 70–99)
Potassium: 4.7 mmol/L (ref 3.5–5.2)
Sodium: 137 mmol/L (ref 134–144)
eGFR: 83 mL/min/{1.73_m2} (ref >59)

## 2021-03-26 LAB — MAGNESIUM: Magnesium: 1.9 mg/dL (ref 1.6–2.3)

## 2021-03-26 NOTE — Progress Notes (Unsigned)
° ° °  SUBJECTIVE:   CHIEF COMPLAINT / HPI:   TN:  PERTINENT  PMH / PSH: ***  OBJECTIVE:   LMP 02/28/2021 (Approximate)   Nursing note and vitals reviewed GEN: *** resting comfortably in chair, NAD, WNWD HEENT: NCAT. PERRLA. Sclera without injection or icterus. MMM.  Neck: Supple.  Cardiac: Regular rate and rhythm. Normal S1/S2. No murmurs, rubs, or gallops appreciated. 2+ radial pulses. Lungs: Clear bilaterally to ascultation. No increased WOB, no accessory muscle usage. No w/r/r. Abdomen: Normoactive bowel sounds. No tenderness to deep or light palpation. No rebound or guarding.  ***  Neuro: AOx3 *** Ext: no edema Psych: Pleasant and appropriate   ASSESSMENT/PLAN:   No problem-specific Assessment & Plan notes found for this encounter.     Shirlean Mylar, MD [facility]

## 2021-03-27 ENCOUNTER — Encounter: Payer: Medicaid Other | Admitting: Family Medicine

## 2021-03-27 ENCOUNTER — Encounter: Payer: Self-pay | Admitting: Family Medicine

## 2021-04-01 ENCOUNTER — Encounter: Payer: Medicaid Other | Admitting: Family Medicine

## 2021-04-01 NOTE — Progress Notes (Unsigned)
° ° °  SUBJECTIVE:   CHIEF COMPLAINT / HPI:   Physical  Anemia, iron deficiency- labs noted iron sat 11% and ferritin of 16 in 04/2019.   HTN- on HCTZ, amlodipine $RemoveBeforeDE'10mg'ldSTRCbgkxXVMhX$ , losartan $RemoveBe'100mg'vPzOPsxIg$   GERD- on pepcid $RemoveB'20mg'IyYLHceR$  daily  Pt is a 41 y.o. yo female who presents for a well woman exam.  Menstrual Hx: LMP *** Obstetrical Hx: G***P*** - Pregnancy complications: - Cesarian sections: Family Planning: Does ***desire pregnancy Folic acid: Currently ***taking PNV Contraception: Currently using ***, has used *** in the past. Sexual Hx: Sexually ***active, *** partners. - H/o STIs: *** Gynecologic Procedures/Surgeries: *** Urinary incontinence symptoms: *** Other GYN history: No hx of fibroids, endometriosis, ovarian cysts, PCOS, or infertility. Diet: *** Exercise: *** Social Hx: She denies smoking, alcohol, or other illicit drug use.  IPV: Pt feels same at home and in current relationships, no past hx of emotional/physical/sexual abuse.  Pap smear: Last one ***, results ***normal Colonoscopy: Last one ***, results ***normal Mammogram: Last one ***, results ***normal DEXA scan: Last one ***, results *** normal Breast Cancer Risk Asssessment %age (for SERM prevention): *** % BRCA Risk: ***  Family Hx: - Breast cancer: *** - Endometrial cancer: *** - Cervical cancer: *** - Ovarian Cancer*** - Colon Cancer: ***  Preventative Health [***] Lipid screening (q5 yrs, >80 y/o): *** [***] Statin: *** [ ***] Aspirin (age 29-59 yo w/ > 10% 10 yr ASCVD risk): *** [***] Colonoscopy (q10 yrs, 54-75 y/o): *** [***] Pap smear (q3 yrs, 90-65 y/o): *** [***] Mammogram (q2 yrs, 76-74 y/o): *** [***] DEXA scan (q2-51yrs >75 y/o, or 24-64 y/o if FRAX > 9.3% 10-yr risk): *** (Tx if T score <-2.5 or T-score between -1 and -2.5 w/ 10 yr risk of hip fx >3% or 10 year risk of major osteo-related fx >=20%) [***] DM II (q3 yrs, asx w/ BP >135/80 or w/ cardiac risk factors): *** [***]  Low-dose CT (q1 yr, 10-80 y/o  w/ 30 pack-year history who currently smoke or quit within past 15 years): *** [***] AAA screening (men 30-75 yo who ever smoked): *** [***] Hep C screening (born between 67 and 30, or risk factors): *** [***] HIV (universal once, repeat if risk factors): *** [***] Flu vaccine (q1 yr): *** [***] Tdap (q10 yrs, if rusty object & >5yo repeat): *** [***] Zoster vaccine (once, >23 y/o): *** [***] Pneumococcal vaccine (>65 y/o: Prevnar 30 then Pneumovax 23 6-12 mos later): *** [***]  Smoking cessation: *** [***]  Domestic violence screen: *** [***] Advanced directives: *** [***] Prostate cancer screening: *** [***] Skin exam: ***   PERTINENT  PMH / PSH: HTN, migraines, obesity, depression, anemia  OBJECTIVE:   There were no vitals taken for this visit.  General: A&O, NAD HEENT: No sign of trauma, EOM grossly intact Cardiac: RRR, no m/r/g Respiratory: CTAB, normal WOB, no w/c/r GI: Soft, NTTP, non-distended  Extremities: NTTP, no peripheral edema. Neuro: Normal gait, moves all four extremities appropriately. Psych: Appropriate mood and affect   ASSESSMENT/PLAN:   No problem-specific Assessment & Plan notes found for this encounter.     Lenoria Chime, MD [place]

## 2021-05-05 ENCOUNTER — Other Ambulatory Visit: Payer: Self-pay | Admitting: Family Medicine

## 2021-05-06 ENCOUNTER — Ambulatory Visit: Payer: Medicaid Other

## 2021-05-07 ENCOUNTER — Ambulatory Visit: Payer: Medicaid Other | Admitting: Family Medicine

## 2021-05-07 ENCOUNTER — Encounter: Payer: Self-pay | Admitting: Family Medicine

## 2021-05-07 ENCOUNTER — Ambulatory Visit (INDEPENDENT_AMBULATORY_CARE_PROVIDER_SITE_OTHER): Payer: Medicaid Other | Admitting: Family Medicine

## 2021-05-07 VITALS — BP 137/91 | HR 60 | Ht 63.78 in | Wt 190.6 lb

## 2021-05-07 DIAGNOSIS — G44209 Tension-type headache, unspecified, not intractable: Secondary | ICD-10-CM

## 2021-05-07 DIAGNOSIS — K219 Gastro-esophageal reflux disease without esophagitis: Secondary | ICD-10-CM | POA: Diagnosis not present

## 2021-05-07 MED ORDER — OMEPRAZOLE 20 MG PO CPDR: 20.0000 mg | DELAYED_RELEASE_CAPSULE | Freq: Every day | ORAL | 3 refills | Status: DC

## 2021-05-07 NOTE — Assessment & Plan Note (Signed)
-  does not seem consistent with migraine or cluster-type headache, most likely tension headache given symptoms and tissue texture changes on exam ?-relaxation techniques including yoga or stretching exercises to help alleviate tension ?-continue tylenol for pain as it has resolved symptoms in the past, may also use ibuprofen but instructed to not use both ?-heating pad as appropriate  ?

## 2021-05-07 NOTE — Progress Notes (Signed)
? ? ?  SUBJECTIVE:  ? ?CHIEF COMPLAINT / HPI:  ? ?Patient presents with 4 day history of burning sensation in her chest, she had this occur last time while in the ED a few months ago. She took some Tums which helped and it felt like she needed to burp, she felt better when she did. Endorsing intermittent epigastric pain with burning sensation that has been relatively constant. Denies chest pain. The only thing that improves it is taking a Tums. Eating something spicy can make it worse but it really depends on what she eats. When she was in the ED previously, she was prescribed pepcid which helped a little but she still felt the need to take Tums to completely resolved the pain.  ? ?Patient also endorsing headaches that hurt sometimes in the back of her head, she has a prior history of migraines. Tylenol helps relieve the pain but these new headaches have been occurring over the past few days. Denies nausea, vomiting, vision changes, numbness, tingling, photophobia or phonophobia. Also reports that her back hurts.  ? ?OBJECTIVE:  ? ?BP (!) 137/91   Pulse 60   Ht 5' 3.78" (1.62 m)   Wt 190 lb 9.6 oz (86.5 kg)   LMP 04/20/2021 (Exact Date)   SpO2 100%   BMI 32.94 kg/m?   ?General: Patient well-appearing, in no acute distress. ?HEENT: tissue tightness noted along C4-C7 and along thoracic vertebrae, no tenderness along temple regions or occipital region  ?CV: RRR, no murmurs or gallops auscultated ?Resp: CTAB, no wheezing, rales or rhonchi noted ?Abdomen: soft, nontender, nondistended, presence of bowel sounds ?MSK: no tenderness noted along palpation of chest wall ?Psych: mood appropriate  ? ?ASSESSMENT/PLAN:  ? ?GERD (gastroesophageal reflux disease) ?-symptoms likely seem consistent with GERD ?-given pepcid did not completely resolve symptoms, will prescribe omeprazole daily ?-instructed to maintain food diary to better identify triggers, bring to next visit ?-GERD precautions discussed and handout provided   ?-follow up in 1 month to assess for improvement  ? ?Tension headache ?-does not seem consistent with migraine or cluster-type headache, most likely tension headache given symptoms and tissue texture changes on exam ?-relaxation techniques including yoga or stretching exercises to help alleviate tension ?-continue tylenol for pain as it has resolved symptoms in the past, may also use ibuprofen but instructed to not use both ?-heating pad as appropriate  ?  ?-PHQ-9 score of 0 reviewed.  ? ?Donney Dice, DO ?[place]  ?

## 2021-05-07 NOTE — Assessment & Plan Note (Signed)
-  symptoms likely seem consistent with GERD ?-given pepcid did not completely resolve symptoms, will prescribe omeprazole daily ?-instructed to maintain food diary to better identify triggers, bring to next visit ?-GERD precautions discussed and handout provided  ?-follow up in 1 month to assess for improvement  ?

## 2021-05-07 NOTE — Patient Instructions (Signed)
It was great seeing you today! ? ?Today we discussed your symptoms, I think this is due to gastroesophageal reflux disease (GERD). I have prescribed omeprazole, please take this daily. Please also maintain a food diary of everything you eat, the time and when you have symptoms. That way we can identify any other foods that may worsen your symptoms. Please make sure to avoid spicy foods, tomato sauce or any other personal triggers that you identify. Please keep at least a 3 hour gap between your last meal of the day and bedtime to avoid reflux symptoms from developing.  ? ?I think your headache is a tension headache. You may keep taking tylenol or take ibuprofen for the pain.  ? ?Please follow up at your next scheduled appointment in 1 month, if anything arises between now and then, please don't hesitate to contact our office. ? ? ?Thank you for allowing [place] to be a part of your medical care! ? ?Thank you, ?Dr. Robyne Peers  ?

## 2021-05-27 ENCOUNTER — Ambulatory Visit: Payer: Medicaid Other

## 2021-05-27 NOTE — Progress Notes (Deleted)
    SUBJECTIVE:   CHIEF COMPLAINT / HPI: tightness in back   Patient presents with report of tightness in her back  She states ***  Fever/chills:***  Weakness in LE: ***  Bowel/bladder incontinence: ***  Injury:***  Paresthesias: ***  Medications/therapies tried: ***    PERTINENT  PMH / PSH:  Migraine HA  HTN  BMI 45-49 MDD   OBJECTIVE:   There were no vitals taken for this visit.  Physical Exam   ASSESSMENT/PLAN:   No problem-specific Assessment & Plan notes found for this encounter.     Ronnald Ramp, MD [facility]

## 2021-05-28 ENCOUNTER — Ambulatory Visit: Payer: Medicaid Other

## 2021-06-02 ENCOUNTER — Other Ambulatory Visit: Payer: Self-pay | Admitting: Family Medicine

## 2021-06-02 DIAGNOSIS — I10 Essential (primary) hypertension: Secondary | ICD-10-CM

## 2021-06-17 ENCOUNTER — Ambulatory Visit: Payer: Medicaid Other | Admitting: Family Medicine

## 2021-07-10 ENCOUNTER — Ambulatory Visit (INDEPENDENT_AMBULATORY_CARE_PROVIDER_SITE_OTHER): Payer: Medicaid Other | Admitting: Student

## 2021-07-10 ENCOUNTER — Other Ambulatory Visit: Payer: Self-pay

## 2021-07-10 ENCOUNTER — Encounter: Payer: Self-pay | Admitting: Student

## 2021-07-10 VITALS — BP 138/82 | HR 61 | Wt 192.6 lb

## 2021-07-10 DIAGNOSIS — G44209 Tension-type headache, unspecified, not intractable: Secondary | ICD-10-CM | POA: Diagnosis not present

## 2021-07-10 DIAGNOSIS — I10 Essential (primary) hypertension: Secondary | ICD-10-CM

## 2021-07-10 MED ORDER — MAGNESIUM OXIDE 400 MG PO CAPS: 400.0000 mg | ORAL_CAPSULE | Freq: Two times a day (BID) | ORAL | 0 refills | Status: DC | PRN

## 2021-07-10 MED ORDER — LOSARTAN POTASSIUM-HCTZ 100-25 MG PO TABS: 1.0000 | ORAL_TABLET | Freq: Every day | ORAL | 11 refills | Status: DC

## 2021-07-10 MED ORDER — PROCHLORPERAZINE MALEATE 10 MG PO TABS: 10.0000 mg | ORAL_TABLET | Freq: Four times a day (QID) | ORAL | 0 refills | Status: DC | PRN

## 2021-07-10 NOTE — Progress Notes (Signed)
    SUBJECTIVE:   CHIEF COMPLAINT / HPI:   Headache x5 days Has a history of tension type headaches, also with documented history of migraine in the chart. Here with 5 days of headache that wraps around head and is worst in occipital zone. It is bilateral though she thinks worse on L compared to R. Has been taking Tylenol and ibuprofen regularly with no relief. Points to multiple life stressors including having five children at home and working a busy job at [employer]. Has been told in the past that her headaches are related to tight muscles in her neck. Was advised to try yoga/stretching but has not been able to find time to do so. No change in vision. No fevers, night sweats, gait instability, sensory, or strength changes. Feels generally well otherwise. Does note that a few of her children have been ill recently with a viral syndrome.   HTN Takes Losartan 100mg  daily and HCTZ 25mg  daily. Has been working on making healthier lifestyle choices with diet and exercise.     OBJECTIVE:   BP 138/82   Pulse 61   Wt 87.4 kg   LMP 06/15/2021   SpO2 100%   BMI 33.29 kg/m   Physical Exam Vitals reviewed.  Constitutional:      General: She is not in acute distress. HENT:     Head:     Comments: No facial/sinus tenderness    Ears:     Comments: L ear TM and canal normal. Eyes:     Extraocular Movements: Extraocular movements intact.  Musculoskeletal:     Cervical back: No rigidity.  Neurological:     Mental Status: She is alert and oriented to person, place, and time.     Cranial Nerves: No cranial nerve deficit.     Sensory: No sensory deficit.     Motor: No weakness.     Gait: Gait normal.     ASSESSMENT/PLAN:   Tension headache Symptoms consistent with tension headache. Consider also NSAID rebound headache given long-term use of ibuprofen. No red-flag symptoms. Will try multi-modal therapy with Magnesium Oxide and Compazine. Hopeful that we can transition away from NSAIDs as the  backbone of her therapy.   Essential hypertension, benign At goal today. Stable at current dosages of Losartan and HCTZ. Will transition to combination pill to reduce pill burden and copay burden. - Losartan HCTZ 100-25mg  once daily     , MD [facility]

## 2021-07-10 NOTE — Patient Instructions (Signed)
Kendra Harris, It is so nice to meet you, I am sorry that you are having headaches.  I think that there may be multiple factors contributing to this.  First, that you have 5 children at home.  I think that we can address your headaches from multiple directions.  I would love to see you using less Tylenol and ibuprofen if we can.  In order to help [place] move away from these, I would like you to start taking magnesium oxide when you are taking ibuprofen and Tylenol.  If this seems to help, try taking less ibuprofen and Tylenol and more of the magnesium to treat your headaches.  I am also sending in some Compazine.  This is a medication similar to Benadryl that should also help with acute headaches to help you get back to your work and family. I am happy with how your blood pressure looks today, to simplify things on your end, I am switching your medicines from 2 separate pills losartan 100 mg and hydrochlorothiazide 25 mg to a combination pill that includes both these medications in it.  You will take this just once a day.   [name], MD

## 2021-07-10 NOTE — Assessment & Plan Note (Signed)
Symptoms consistent with tension headache. Consider also NSAID rebound headache given long-term use of ibuprofen. No red-flag symptoms. Will try multi-modal therapy with Magnesium Oxide and Compazine. Hopeful that we can transition away from NSAIDs as the backbone of her therapy.

## 2021-07-10 NOTE — Assessment & Plan Note (Addendum)
At goal today. Stable at current dosages of Losartan and HCTZ. Will transition to combination pill to reduce pill burden and copay burden. - Losartan HCTZ 100-25mg  once daily

## 2021-07-14 ENCOUNTER — Encounter: Payer: Self-pay | Admitting: *Deleted

## 2021-08-05 ENCOUNTER — Other Ambulatory Visit (HOSPITAL_COMMUNITY)
Admission: RE | Admit: 2021-08-05 | Discharge: 2021-08-05 | Disposition: A | Discharge status: Home / Self Care | Payer: Medicaid Other | Source: Ambulatory Visit | Attending: Family Medicine | Admitting: Family Medicine

## 2021-08-05 ENCOUNTER — Encounter: Payer: Self-pay | Admitting: Family Medicine

## 2021-08-05 ENCOUNTER — Ambulatory Visit (INDEPENDENT_AMBULATORY_CARE_PROVIDER_SITE_OTHER): Payer: Medicaid Other | Admitting: Family Medicine

## 2021-08-05 VITALS — BP 145/94 | HR 60 | Ht 63.0 in | Wt 192.6 lb

## 2021-08-05 DIAGNOSIS — K219 Gastro-esophageal reflux disease without esophagitis: Secondary | ICD-10-CM

## 2021-08-05 DIAGNOSIS — G44209 Tension-type headache, unspecified, not intractable: Secondary | ICD-10-CM

## 2021-08-05 DIAGNOSIS — Z113 Encounter for screening for infections with a predominantly sexual mode of transmission: Secondary | ICD-10-CM | POA: Diagnosis present

## 2021-08-05 DIAGNOSIS — Z0000 Encounter for general adult medical examination without abnormal findings: Secondary | ICD-10-CM

## 2021-08-05 DIAGNOSIS — I10 Essential (primary) hypertension: Secondary | ICD-10-CM

## 2021-08-05 DIAGNOSIS — F322 Major depressive disorder, single episode, severe without psychotic features: Secondary | ICD-10-CM

## 2021-08-05 MED ORDER — ESCITALOPRAM OXALATE 20 MG PO TABS: 20.0000 mg | ORAL_TABLET | Freq: Every day | ORAL | 2 refills | Status: DC

## 2021-08-05 MED ORDER — LOSARTAN POTASSIUM-HCTZ 100-25 MG PO TABS: 1.0000 | ORAL_TABLET | Freq: Every day | ORAL | 11 refills | Status: DC

## 2021-08-05 MED ORDER — MAGNESIUM OXIDE 400 MG PO CAPS: 400.0000 mg | ORAL_CAPSULE | Freq: Two times a day (BID) | ORAL | 0 refills | Status: DC | PRN

## 2021-08-05 MED ORDER — OMEPRAZOLE 20 MG PO CPDR: 20.0000 mg | DELAYED_RELEASE_CAPSULE | Freq: Every day | ORAL | 3 refills | Status: DC

## 2021-08-05 MED ORDER — METHOCARBAMOL 500 MG PO TABS: 500.0000 mg | ORAL_TABLET | Freq: Three times a day (TID) | ORAL | 2 refills | Status: DC | PRN

## 2021-08-05 MED ORDER — AMLODIPINE BESYLATE 10 MG PO TABS: ORAL_TABLET | ORAL | 2 refills | Status: DC

## 2021-08-05 MED ORDER — FAMOTIDINE 20 MG PO TABS: 20.0000 mg | ORAL_TABLET | Freq: Two times a day (BID) | ORAL | 2 refills | Status: DC

## 2021-08-05 NOTE — Patient Instructions (Signed)
It was great seeing you today!   Visit Remembers: - Stop by the pharmacy to pick up your prescriptions  - Continue to work on your healthy eating habits and incorporating exercise into your daily life. Increase the intensity of your exercise.  - Your goal is to have an BP < 120 /80 - For your blood pressure: be sure to take the combo pill and the amlodipine daily   Regarding lab work today:  Due to recent changes in healthcare laws, you may see the results of your imaging and laboratory studies on MyChart before your provider has had a chance to review them.  I understand that in some cases there may be results that are confusing or concerning to you. Not all laboratory results come back in the same time frame and you may be waiting for multiple results in order to interpret others.  Please give [place] 72 hours in order for your provider to thoroughly review all the results before contacting the office for clarification of your results. If everything is normal, you will get a letter in the mail or a message in My Chart. Please give [place] a call if you do not hear from [place] after 2 weeks.  Please bring all of your medications with you to each visit.    Feel free to call with any questions or concerns at any time, at [phone].   Take care,  Dr. Katherina Right [facility]

## 2021-08-05 NOTE — Assessment & Plan Note (Signed)
Elevated today but patient out of amlodipine. Refill sent to pharmacy. Continue losartan-HCTZ.

## 2021-08-05 NOTE — Progress Notes (Signed)
     SUBJECTIVE:   Chief compliant/HPI: annual examination  Kendra Harris is a 41 y.o. who presents today for an annual exam.   History tabs reviewed and updated.   Review of systems form reviewed and notable for shoulder and back pain.  [place]area denies smoking, drinking alcohol and illicit drug use.  Walks three times a week. Support system are her daughter and mom. Works at [employer].    OBJECTIVE:   BP (!) 145/94   Pulse 60   Ht $R'5\' 3"'pW$  (1.6 m)   Wt 192 lb 9.6 oz (87.4 kg)   LMP 07/26/2021   SpO2 100%   BMI 34.12 kg/m    GEN: pleasant well appearing female, in no acute distress  HENT: TM normal b/l, moist mucus membranes  CV: regular rate and rhythm, no murmurs appreciated  RESP: no increased work of breathing, clear to ascultation bilaterally ABD: Bowel sounds present. Soft, non-tender, non-distended.  MSK: no edema, upper and lateral trapezius is hypertonic with multiple tender points, no midline C, T or L spine tenderness   SKIN: warm, dry NEURO: alert, moves all extremities appropriately, strength 5/5 BUE and BLE, gross sensation intact  PSYCH: Normal affect, appropriate speech and behavior    ASSESSMENT/PLAN:   Essential hypertension, benign Elevated today but patient out of amlodipine. Refill sent to pharmacy. Continue losartan-HCTZ.    Refilled medications for stable chronic health conditions.   Annual Examination  See AVS for age appropriate recommendations.   PHQ score 0, reviewed and discussed. Refilled Lexapro.  Blood pressure reviewed and is not at goal.  Asked about intimate partner violence and resources given as appropriate  The patient uses nothing for contraception as she has been abstinent for the past 3 years.  Considered the following items based upon USPSTF recommendations: Diabetes screening: discussed and ordered Screening for elevated cholesterol: discussed and ordered HIV testing: discussed and ordered Hepatitis C: discussed and Negative  in 2021 Hepatitis B: discussed Syphilis if at high risk: discussed and ordered GC/CT discussed and ordered.  Reviewed risk factors for latent tuberculosis and not indicated Reviewed risk factors for osteoporosis. No early screening ordered.   Discussed family history, BRCA testing not indicated. No family history of breast cancer.  Cervical cancer screening: prior Pap reviewed, repeat due in 2025 PAP in July 2020 was negative.  Breast cancer screening: discussed potential benefits, risks including overdiagnosis and biopsy, elected to wait until age 27 Colorectal cancer screening: not applicable given age.  if age 28 or over.   Follow up in 1  year or sooner if indicated.    Lyndee Hensen, [place]

## 2021-08-06 LAB — HEMOGLOBIN A1C
Est. average glucose Bld gHb Est-mCnc: 126 mg/dL
Hgb A1c MFr Bld: 6 % — ABNORMAL HIGH (ref 4.8–5.6)

## 2021-08-06 LAB — CERVICOVAGINAL ANCILLARY ONLY
Chlamydia: NEGATIVE
Comment: NEGATIVE
Comment: NEGATIVE
Comment: NORMAL
Neisseria Gonorrhea: NEGATIVE
Trichomonas: NEGATIVE

## 2021-08-06 LAB — RPR: RPR Ser Ql: NONREACTIVE

## 2021-08-06 LAB — LIPID PANEL
Chol/HDL Ratio: 3.3 ratio (ref 0.0–4.4)
Cholesterol, Total: 197 mg/dL (ref 100–199)
HDL: 59 mg/dL (ref >39)
LDL Chol Calc (NIH): 129 mg/dL — ABNORMAL HIGH (ref 0–99)
Triglycerides: 50 mg/dL (ref 0–149)
VLDL Cholesterol Cal: 9 mg/dL (ref 5–40)

## 2021-08-06 LAB — HIV ANTIBODY (ROUTINE TESTING W REFLEX): HIV Screen 4th Generation wRfx: NONREACTIVE

## 2021-08-14 ENCOUNTER — Telehealth: Payer: Self-pay

## 2021-08-14 NOTE — Telephone Encounter (Signed)
Patient calls nurse line regarding results from visit on 6/28. Patient does not have access to mychart, therefore, was unable to view results.   Discussed results per Dr. Lezlie Octave note. Answered all of patient's questions.   Patient will follow up in three months for cholesterol and prediabetes.   Veronda Prude, RN

## 2021-08-19 ENCOUNTER — Ambulatory Visit: Payer: Medicaid Other

## 2021-08-19 NOTE — Progress Notes (Deleted)
    SUBJECTIVE:   CHIEF COMPLAINT / HPI:  No chief complaint on file.   ***  PERTINENT  PMH / PSH: ***  Patient Care Team: Littie Deeds, MD as PCP - General (Family Medicine)   OBJECTIVE:   LMP 07/26/2021   Physical Exam      08/05/2021    9:46 AM  Depression screen PHQ 2/9  Decreased Interest 0  Down, Depressed, Hopeless 0  PHQ - 2 Score 0  Altered sleeping 0  Tired, decreased energy 0  Change in appetite 0  Feeling bad or failure about yourself  0  Trouble concentrating 0  Moving slowly or fidgety/restless 0  Suicidal thoughts 0  PHQ-9 Score 0  Difficult doing work/chores Not difficult at all     {Show previous vital signs (optional):23777}  {Labs  Heme  Chem  Endocrine  Serology  Results Review (optional):23779}  ASSESSMENT/PLAN:   No problem-specific Assessment & Plan notes found for this encounter.    No follow-ups on file.   Littie Deeds, MD [facility]

## 2021-08-21 ENCOUNTER — Ambulatory Visit (HOSPITAL_COMMUNITY): Payer: Self-pay

## 2021-09-25 ENCOUNTER — Ambulatory Visit (HOSPITAL_COMMUNITY)
Admission: RE | Admit: 2021-09-25 | Discharge: 2021-09-25 | Disposition: A | Discharge status: Home / Self Care | Payer: Medicaid Other | Source: Ambulatory Visit | Attending: Physician Assistant | Admitting: Physician Assistant

## 2021-09-25 ENCOUNTER — Encounter (HOSPITAL_COMMUNITY): Payer: Self-pay

## 2021-09-25 VITALS — HR 62 | Temp 98.2°F | Resp 18

## 2021-09-25 DIAGNOSIS — M7918 Myalgia, other site: Secondary | ICD-10-CM

## 2021-09-25 LAB — POCT URINALYSIS DIPSTICK, ED / UC
Bilirubin Urine: NEGATIVE
Glucose, UA: NEGATIVE mg/dL
Ketones, ur: NEGATIVE mg/dL
Leukocytes,Ua: NEGATIVE
Nitrite: NEGATIVE
Protein, ur: NEGATIVE mg/dL
Specific Gravity, Urine: 1.02 (ref 1.005–1.030)
Urobilinogen, UA: 0.2 mg/dL (ref 0.0–1.0)
pH: 7 (ref 5.0–8.0)

## 2021-09-25 MED ORDER — CYCLOBENZAPRINE HCL 5 MG PO TABS: 5.0000 mg | ORAL_TABLET | Freq: Three times a day (TID) | ORAL | 0 refills | Status: DC | PRN

## 2021-09-25 NOTE — Discharge Instructions (Addendum)
Your urinalysis shows no signs of infection Can take Flexeril as needed for muscle spasm Can take Ibuprofen as needed Recommend stretching and ice to affected areas

## 2021-09-25 NOTE — ED Triage Notes (Signed)
Pt reports bilateral muscle tightness in shoulders over a year, lower back pain and bilateral flank pain x 2 days. States she wants to make sure she doesn't have an UTI. Denies any urinary symptoms.

## 2021-09-25 NOTE — ED Provider Notes (Signed)
[facility]    CSN: [id number] Arrival date & time: 09/25/21  1644      History   Chief Complaint Chief Complaint  Patient presents with   Shoulder Pain   Back Pain   Flank Pain    HPI Kendra Harris is a 41 y.o. female.   Pt complains of chronic bilateral trapezius tenderness that started about one year ago.  Reports today it is unchanged, no new injury or trauma.  She is also complaining of lower back pain that is new, started a few days ago.  No injury or trauma.  She denies urinary sx.  She is worried she may have a UTI. Denies fever, chills, n/v/d.  Denies numbness, tingling, weakness, radiation of pain. She has taken nothing for the pain.  Previously has taken muscle relaxer which provided relief.  She reports she has run out of this prescription.     Past Medical History:  Diagnosis Date   DELAYED MENSES 02/11/2010   Qualifier: Diagnosis of  By: Wallene Huh  MD, Khary     Hypertension    MIGRAINE HEADACHE 12/13/2007   Urinary tract infection 03/04/2011    Patient Active Problem List   Diagnosis Date Noted   GERD (gastroesophageal reflux disease) 05/07/2021   Tension headache 05/07/2021   Side pain 03/04/2021   Hypokalemia 03/04/2021   Anemia 04/11/2019   Major depressive disorder, recurrent episode, severe, with psychotic behavior (HCC) 08/14/2018   Menorrhagia 04/27/2013   Severe major depression without psychotic features (HCC) 07/24/2010   Essential hypertension, benign 08/31/2007   Morbid obesity with BMI of 45.0-49.9, adult (HCC) 04/07/2006    Past Surgical History:  Procedure Laterality Date   NO PAST SURGERIES      OB History     Gravida  5   Para  5   Term  5   Preterm  0   AB  0   Living  5      SAB  0   IAB  0   Ectopic  0   Multiple  0   Live Births  5            Home Medications    Prior to Admission medications   Medication Sig Start Date End Date Taking? Authorizing Provider  cyclobenzaprine  (FLEXERIL) 5 MG tablet Take 1 tablet (5 mg total) by mouth 3 (three) times daily as needed for muscle spasms. 09/25/21  Yes Ward, Tylene Fantasia, PA-C  acetaminophen (TYLENOL) 500 MG tablet Take 1,000 mg by mouth every 6 (six) hours as needed.    [provider]  amLODipine (NORVASC) 10 MG tablet TAKE 1 TABLET(10 MG) BY MOUTH DAILY 08/05/21   Brimage, Vondra, DO  escitalopram (LEXAPRO) 20 MG tablet Take 1 tablet (20 mg total) by mouth daily. 08/05/21   Brimage, Seward Meth, DO  famotidine (PEPCID) 20 MG tablet Take 1 tablet (20 mg total) by mouth 2 (two) times daily. Take 30 minutes before breakfast and dinner 08/05/21 11/03/21  Katha Cabal, DO  losartan-hydrochlorothiazide (HYZAAR) 100-25 MG tablet Take 1 tablet by mouth daily. 08/05/21 08/05/22  Katha Cabal, DO  Magnesium Oxide 400 MG CAPS Take 1 capsule (400 mg total) by mouth 2 (two) times daily as needed. 08/05/21   Katha Cabal, DO  methocarbamol (ROBAXIN) 500 MG tablet Take 1 tablet (500 mg total) by mouth every 8 (eight) hours as needed for muscle spasms. 08/05/21   Katha Cabal, DO  omeprazole (PRILOSEC) 20 MG capsule Take 1 capsule (  20 mg total) by mouth daily. 08/05/21   Katha Cabal, DO    Family History Family History  Problem Relation Age of Onset   Sickle cell trait Sister     Social History Social History   Tobacco Use   Smoking status: Never   Smokeless tobacco: Never  Vaping Use   Vaping Use: Never used  Substance Use Topics   Alcohol use: No   Drug use: No     Allergies   Patient has no known allergies.   Review of Systems Review of Systems  Constitutional:  Negative for chills and fever.  HENT:  Negative for ear pain and sore throat.   Eyes:  Negative for pain and visual disturbance.  Respiratory:  Negative for cough and shortness of breath.   Cardiovascular:  Negative for chest pain and palpitations.  Gastrointestinal:  Negative for abdominal pain and vomiting.  Genitourinary:  Negative for  dysuria and hematuria.  Musculoskeletal:  Positive for back pain and neck pain. Negative for arthralgias.  Skin:  Negative for color change and rash.  Neurological:  Negative for seizures and syncope.  All other systems reviewed and are negative.    Physical Exam Triage Vital Signs ED Triage Vitals  Enc Vitals Group     BP --      Pulse Rate 09/25/21 1712 62     Resp 09/25/21 1712 18     Temp 09/25/21 1712 98.2 F (36.8 C)     Temp Source 09/25/21 1712 Oral     SpO2 09/25/21 1712 100 %     Weight --      Height --      Head Circumference --      Peak Flow --      Pain Score 09/25/21 1710 3     Pain Loc --      Pain Edu? --      Excl. in GC? --    No data found.  Updated Vital Signs Pulse 62   Temp 98.2 F (36.8 C) (Oral)   Resp 18   SpO2 100%   Visual Acuity Right Eye Distance:   Left Eye Distance:   Bilateral Distance:    Right Eye Near:   Left Eye Near:    Bilateral Near:     Physical Exam Vitals and nursing note reviewed.  Constitutional:      General: She is not in acute distress.    Appearance: She is well-developed.  HENT:     Head: Normocephalic and atraumatic.  Eyes:     Conjunctiva/sclera: Conjunctivae normal.  Cardiovascular:     Rate and Rhythm: Normal rate and regular rhythm.     Heart sounds: No murmur heard. Pulmonary:     Effort: Pulmonary effort is normal. No respiratory distress.     Breath sounds: Normal breath sounds.  Abdominal:     Palpations: Abdomen is soft.     Tenderness: There is no abdominal tenderness.  Musculoskeletal:        General: No swelling.     Cervical back: Neck supple.     Comments: TTP with spasm right trapezius.  No flank pain, no SLR.  Normal strength and sensation.   Skin:    General: Skin is warm and dry.     Capillary Refill: Capillary refill takes less than 2 seconds.  Neurological:     Mental Status: She is alert.  Psychiatric:        Mood and Affect: Mood normal.  UC Treatments / Results   Labs (all labs ordered are listed, but only abnormal results are displayed) Labs Reviewed  POCT URINALYSIS DIPSTICK, ED / UC - Abnormal; Notable for the following components:      Result Value   Hgb urine dipstick LARGE (*)    All other components within normal limits    EKG   Radiology No results found.  Procedures Procedures (including critical care time)  Medications Ordered in UC Medications - No data to display  Initial Impression / Assessment and Plan / UC Course  I have reviewed the triage vital signs and the nursing notes.  Pertinent labs & imaging results that were available during my care of the patient were reviewed by me and considered in my medical decision making (see chart for details).     Pain is musculoskeletal.  Flexeril prescribed.  Supportive care discussed. Return precautions discussed.   Final Clinical Impressions(s) / UC Diagnoses   Final diagnoses:  Musculoskeletal pain     Discharge Instructions      Your urinalysis shows no signs of infection Can take Flexeril as needed for muscle spasm Can take Ibuprofen as needed Recommend stretching and ice to affected areas   ED Prescriptions     Medication Sig Dispense Auth. Provider   cyclobenzaprine (FLEXERIL) 5 MG tablet Take 1 tablet (5 mg total) by mouth 3 (three) times daily as needed for muscle spasms. 30 tablet Ward, Tylene Fantasia, PA-C      PDMP not reviewed this encounter.   Ward, Tylene Fantasia, PA-C 09/25/21 1745

## 2021-09-29 ENCOUNTER — Ambulatory Visit: Payer: Medicaid Other

## 2021-10-26 ENCOUNTER — Other Ambulatory Visit: Payer: Self-pay

## 2021-10-27 MED ORDER — CYCLOBENZAPRINE HCL 5 MG PO TABS
5.0000 mg | ORAL_TABLET | Freq: Three times a day (TID) | ORAL | 0 refills | Status: DC | PRN
Start: 2021-10-27 — End: 2021-11-23

## 2021-11-23 ENCOUNTER — Other Ambulatory Visit: Payer: Self-pay | Admitting: Family Medicine

## 2021-11-27 ENCOUNTER — Ambulatory Visit (HOSPITAL_COMMUNITY): Payer: Medicaid Other

## 2021-11-28 ENCOUNTER — Other Ambulatory Visit: Payer: Self-pay | Admitting: Family Medicine

## 2021-11-28 DIAGNOSIS — I10 Essential (primary) hypertension: Secondary | ICD-10-CM

## 2021-12-01 ENCOUNTER — Ambulatory Visit (INDEPENDENT_AMBULATORY_CARE_PROVIDER_SITE_OTHER): Payer: Medicaid Other | Admitting: Student

## 2021-12-01 DIAGNOSIS — K219 Gastro-esophageal reflux disease without esophagitis: Secondary | ICD-10-CM

## 2021-12-01 MED ORDER — OMEPRAZOLE 20 MG PO CPDR: 20.0000 mg | DELAYED_RELEASE_CAPSULE | Freq: Every day | ORAL | 3 refills | Status: DC

## 2021-12-01 NOTE — Progress Notes (Signed)
    SUBJECTIVE:   CHIEF COMPLAINT / HPI:   Patient is a 41 year old female with history of GERD presents today for abdominal pain. Describes abdominal pain as burning sensation at the top of her abdomen and up to the chest Report no NSAID use Denies alcohol/smoking but endorses spicy food and coffee Reports intermittent nausea and constipation but no vomiting, diarrhea, or blood in stool. Patient report she hasn't been taking her omeprazole  PERTINENT  PMH / PSH: Reviewed  OBJECTIVE:   BP 124/78   Pulse 64   Ht 5\' 3"  (1.6 m)   Wt 205 lb 9.6 oz (93.3 kg)   SpO2 99%   BMI 36.42 kg/m    Physical Exam General: Alert, well appearing, NAD Cardiovascular: RRR, No Murmurs, Normal S2/S2 Respiratory: CTAB, No wheezing or Rales Abdomen: No distension or tenderness.    ASSESSMENT/PLAN:   GERD (gastroesophageal reflux disease) Patient with history of GERD on omeprazole who presents with plaint of epigastric abdominal pain described as burning sensation.  She endorses noncompliance to her omeprazole.  History and symptoms consistent with GERD flareup. Refilled patient's omeprazole and encouraged her to take her medications as prescribed.  Ordered lab for Urea breath test to assess for H. pylori.  Provided return precaution.  Patient to follow-up in a month to reassess symptom  HCM She is due for the influenza vaccine  She declined vaccination today.     Alen Bleacher, MD [place]

## 2021-12-01 NOTE — Patient Instructions (Signed)
It was wonderful to meet you today. Thank you for allowing me to be a part of your care. Below is a short summary of what we discussed at your visit today:  Your stomach pain is likely due to indigestion or GERD.  I have refilled your omeprazole please take it daily as prescribed.  Avoid alcohol, smoking or spicy food.  I have ordered labs to check for H. pylori.  We will follow-up with you with results.  Please follow-up in a month.  Please bring all of your medications to every appointment!  If you have any questions or concerns, please do not hesitate to contact us via phone or MyChart message.   Alen Bleacher, MD [facility]

## 2021-12-01 NOTE — Assessment & Plan Note (Addendum)
Patient with history of GERD on omeprazole who presents with plaint of epigastric abdominal pain described as burning sensation.  She endorses noncompliance to her omeprazole.  History and symptoms consistent with GERD flareup. Refilled patient's omeprazole and encouraged her to take her medications as prescribed.  Ordered lab for Urea breath test to assess for H. pylori.  Provided return precaution.  Patient to follow-up in a month to reassess symptom

## 2021-12-04 ENCOUNTER — Telehealth: Payer: Self-pay

## 2021-12-04 NOTE — Telephone Encounter (Signed)
Patient calls nurse line requesting test results from recent visit.   Advised patient they are not back yet.   Patient reports continued burning discomfort despite taking Omeprazole daily.   Patient is requesting an alternative medication or an additional medication.  Will forward to provider who saw patient.

## 2021-12-05 LAB — H. PYLORI BREATH TEST: H pylori Breath Test: NEGATIVE

## 2021-12-08 NOTE — Telephone Encounter (Signed)
Called patient to discuss lab findings from her test.  However, dropped in between conversation.  Multiple attempts to reconnect with patient failed.  Will send patient a mychart message

## 2021-12-08 NOTE — Progress Notes (Deleted)
    SUBJECTIVE:   CHIEF COMPLAINT / HPI:   Seen for GERD on 12/01/21  PERTINENT  PMH / PSH: *** Patient Active Problem List   Diagnosis Date Noted   GERD (gastroesophageal reflux disease) 05/07/2021   Tension headache 05/07/2021   Side pain 03/04/2021   Hypokalemia 03/04/2021   Anemia 04/11/2019   Major depressive disorder, recurrent episode, severe, with psychotic behavior ([place]) 08/14/2018   Menorrhagia 04/27/2013   Severe major depression without psychotic features ([place]) 07/24/2010   Essential hypertension, benign 08/31/2007   Morbid obesity with BMI of 45.0-49.9, adult ([place]) 04/07/2006    Current Outpatient Medications  Medication Instructions   acetaminophen (TYLENOL) 1,000 mg, Oral, Every 6 hours PRN   amLODipine (NORVASC) 10 MG tablet TAKE 1 TABLET(10 MG) BY MOUTH DAILY   cyclobenzaprine (FLEXERIL) 5 MG tablet TAKE 1 TABLET(5 MG) BY MOUTH THREE TIMES DAILY AS NEEDED FOR MUSCLE SPASMS   losartan-hydrochlorothiazide (HYZAAR) 100-25 MG tablet 1 tablet, Oral, Daily   omeprazole (PRILOSEC) 20 mg, Oral, Daily       12/01/2021    8:47 AM 09/25/2021    5:12 PM 08/05/2021    9:36 AM  Vitals with BMI  Height 5\' 3"   5\' 3"   Weight 205 lbs 10 oz  192 lbs 10 oz  BMI 50.53  97.67  Systolic 341  937  Diastolic 78  94  Pulse 64 62 60      OBJECTIVE:   There were no vitals taken for this visit.  ***  ASSESSMENT/PLAN:   There are no diagnoses linked to this encounter.   There are no Patient Instructions on file for this visit.   Lind Covert, MD [place]

## 2021-12-09 ENCOUNTER — Ambulatory Visit: Payer: Medicaid Other | Admitting: Family Medicine

## 2021-12-11 ENCOUNTER — Ambulatory Visit: Payer: Medicaid Other | Admitting: Student

## 2021-12-13 ENCOUNTER — Encounter (HOSPITAL_COMMUNITY): Payer: Self-pay

## 2021-12-13 ENCOUNTER — Ambulatory Visit (HOSPITAL_COMMUNITY)
Admission: EM | Admit: 2021-12-13 | Discharge: 2021-12-13 | Disposition: A | Discharge status: Home / Self Care | Payer: Medicaid Other | Attending: Internal Medicine | Admitting: Internal Medicine

## 2021-12-13 ENCOUNTER — Ambulatory Visit (HOSPITAL_COMMUNITY): Payer: Self-pay

## 2021-12-13 DIAGNOSIS — M546 Pain in thoracic spine: Secondary | ICD-10-CM | POA: Diagnosis not present

## 2021-12-13 MED ORDER — CYCLOBENZAPRINE HCL 10 MG PO TABS: 10.0000 mg | ORAL_TABLET | Freq: Two times a day (BID) | ORAL | 0 refills | Status: AC | PRN

## 2021-12-13 MED ORDER — ACETAMINOPHEN 325 MG PO TABS
650.0000 mg | ORAL_TABLET | Freq: Once | ORAL | Status: AC
  Administered 2021-12-13: 650 mg via ORAL

## 2021-12-13 MED ORDER — ACETAMINOPHEN 325 MG PO TABS
ORAL_TABLET | ORAL | Status: AC
  Filled 2021-12-13: qty 2

## 2021-12-13 NOTE — Discharge Instructions (Signed)
Continue tylenol every 4 hours as needed for pain. Use muscle relaxer twice daily as needed.  You can apply topical lidocaine patch to the back as well.  Please return to the urgent care if symptoms do not improve, or go to the emergency department if symptoms worsen.

## 2021-12-13 NOTE — ED Triage Notes (Signed)
Pt is here for lower back pain x2days

## 2021-12-13 NOTE — ED Provider Notes (Signed)
[name]    CSN: [id number] Arrival date & time: 12/13/21  1603     History   Chief Complaint Chief Complaint  Patient presents with   Back Pain    HPI Kendra Harris is a 41 y.o. female.  Presents with 2-day history of back pain Reports left mid back and some midline 4/10 discomfort   Denies any injury, trauma, pain with motion Denies urinary symptoms but reports last time she had back pain, she had a UTI  Denies any bowel or bladder dysfunction or incontinence  Niece was sick recently.  Patient has some nasal congestion, hoarse voice.  Wonders if back pain is due to flu/virus  No medications taken  Past Medical History:  Diagnosis Date   DELAYED MENSES 02/11/2010   Qualifier: Diagnosis of  By: Sherilyn Cooter  MD, Khary     Hypertension    MIGRAINE HEADACHE 12/13/2007   Urinary tract infection 03/04/2011    Patient Active Problem List   Diagnosis Date Noted   GERD (gastroesophageal reflux disease) 05/07/2021   Tension headache 05/07/2021   Side pain 03/04/2021   Hypokalemia 03/04/2021   Anemia 04/11/2019   Major depressive disorder, recurrent episode, severe, with psychotic behavior ([place]) 08/14/2018   Menorrhagia 04/27/2013   Severe major depression without psychotic features ([place]) 07/24/2010   Essential hypertension, benign 08/31/2007   Morbid obesity with BMI of 45.0-49.9, adult ([place]) 04/07/2006    Past Surgical History:  Procedure Laterality Date   NO PAST SURGERIES      OB History     Gravida  5   Para  5   Term  5   Preterm  0   AB  0   Living  5      SAB  0   IAB  0   Ectopic  0   Multiple  0   Live Births  5            Home Medications    Prior to Admission medications   Medication Sig Start Date End Date Taking? Authorizing Provider  cyclobenzaprine (FLEXERIL) 10 MG tablet Take 1 tablet (10 mg total) by mouth 2 (two) times daily as needed for up to 5 days for muscle spasms. 12/13/21 12/18/21 Yes Raphael Espe,  Wells Guiles, PA-C  amLODipine (NORVASC) 10 MG tablet TAKE 1 TABLET(10 MG) BY MOUTH DAILY 11/30/21   Zola Button, MD  losartan-hydrochlorothiazide (HYZAAR) 100-25 MG tablet Take 1 tablet by mouth daily. 08/05/21 08/05/22  Lyndee Hensen, DO  omeprazole (PRILOSEC) 20 MG capsule Take 1 capsule (20 mg total) by mouth daily. 12/01/21   Alen Bleacher, MD    Family History Family History  Problem Relation Age of Onset   Sickle cell trait Sister     Social History Social History   Tobacco Use   Smoking status: Never   Smokeless tobacco: Never  Vaping Use   Vaping Use: Never used  Substance Use Topics   Alcohol use: No   Drug use: No     Allergies   Patient has no known allergies.   Review of Systems Review of Systems  Musculoskeletal:  Positive for back pain.   Per HPI  Physical Exam Triage Vital Signs ED Triage Vitals  Enc Vitals Group     BP 12/13/21 1621 (!) 138/96     Pulse Rate 12/13/21 1621 83     Resp 12/13/21 1621 16     Temp 12/13/21 1621 98.7 F (37.1 C)     Temp  src --      SpO2 12/13/21 1621 98 %     Weight --      Height --      Head Circumference --      Peak Flow --      Pain Score 12/13/21 1619 4     Pain Loc --      Pain Edu? --      Excl. in [place]? --    No data found.  Updated Vital Signs BP (!) 138/96 (BP Location: Left Wrist)   Pulse 83   Temp 98.7 F (37.1 C)   Resp 16   LMP 11/14/2021   SpO2 98%      Physical Exam Vitals and nursing note reviewed.  Constitutional:      General: She is not in acute distress.    Appearance: Normal appearance.  HENT:     Mouth/Throat:     Pharynx: Oropharynx is clear.  Eyes:     Extraocular Movements: Extraocular movements intact.     Pupils: Pupils are equal, round, and reactive to light.  Cardiovascular:     Rate and Rhythm: Normal rate and regular rhythm.     Pulses: Normal pulses.     Heart sounds: Normal heart sounds.  Pulmonary:     Effort: Pulmonary effort is normal.     Breath sounds:  Normal breath sounds.  Abdominal:     Tenderness: There is no right CVA tenderness or left CVA tenderness.  Musculoskeletal:        General: No tenderness or deformity. Normal range of motion.     Cervical back: No rigidity or tenderness.     Comments: Spine nontender.  No spasm or tenderness felt in paraspinals.  Full range of motion of the back.  No CVA tenderness  Skin:    General: Skin is warm and dry.  Neurological:     General: No focal deficit present.     Mental Status: She is alert and oriented to person, place, and time.     Cranial Nerves: Cranial nerves 2-12 are intact. No facial asymmetry.     Sensory: Sensation is intact.     Motor: Motor function is intact.     Coordination: Coordination is intact.     Comments: 5/5 strength throughout, pulses strong, sensation intact     UC Treatments / Results  Labs (all labs ordered are listed, but only abnormal results are displayed) Labs Reviewed - No data to display  Urinalysis: Glucose negative Bilirubin neg Ketones neg SG 1.025 Moderate hgb pH 6.0 Protein negative Uro 0.2 E.U/dL Nitrates negative Leuks negative   EKG  Radiology No results found.  Procedures Procedures   Medications Ordered in UC Medications  acetaminophen (TYLENOL) tablet 650 mg (650 mg Oral Given 12/13/21 1650)    Initial Impression / Assessment and Plan / UC Course  I have reviewed the triage vital signs and the nursing notes.  Pertinent labs & imaging results that were available during my care of the patient were reviewed by me and considered in my medical decision making (see chart for details).  Declined viral testing  Normal neuro exam without red flags Tylenol dose given with improvement in pain.  Urinalysis with moderate blood. Patient due for menstrual cycle, don't know if this is related Presentation does not seem consistent with kidney stone, although cannot rule out. She does have PCP appointment in 2 days and can follow  up there for re-evaluation. Otherwise recheck urine in 4-6  weeks. Discussed return if she develops other symptoms, ED precautions for anything severe.  Discussed use of Tylenol every 4 hours as needed.  Could be muscular pain.  Also could use lidocaine patch to the area as needed.  Recommend gentle stretching Return precautions discussed. Patient agrees to plan  Final Clinical Impressions(s) / UC Diagnoses   Final diagnoses:  Acute left-sided thoracic back pain     Discharge Instructions      Continue tylenol every 4 hours as needed for pain. Use muscle relaxer twice daily as needed.  You can apply topical lidocaine patch to the back as well.  Please return to the urgent care if symptoms do not improve, or go to the emergency department if symptoms worsen.     ED Prescriptions     Medication Sig Dispense Auth. Provider   cyclobenzaprine (FLEXERIL) 10 MG tablet Take 1 tablet (10 mg total) by mouth 2 (two) times daily as needed for up to 5 days for muscle spasms. 10 tablet Tichina Koebel, Wells Guiles, PA-C      PDMP not reviewed this encounter.   Mirai Greenwood, Wells Guiles, [place] 12/13/21 1713

## 2021-12-14 LAB — POCT URINALYSIS DIPSTICK, ED / UC
Bilirubin Urine: NEGATIVE
Glucose, UA: NEGATIVE mg/dL
Ketones, ur: NEGATIVE mg/dL
Leukocytes,Ua: NEGATIVE
Nitrite: NEGATIVE
Protein, ur: NEGATIVE mg/dL
Specific Gravity, Urine: 1.025 (ref 1.005–1.030)
Urobilinogen, UA: 0.2 mg/dL (ref 0.0–1.0)
pH: 6 (ref 5.0–8.0)

## 2021-12-15 ENCOUNTER — Ambulatory Visit: Payer: Medicaid Other | Admitting: Student

## 2021-12-15 IMAGING — CR DG CHEST 2V
2 series · 2 of 2 positions shown · non-contrast
Comparison: Remote radiograph 04/14/2008

CLINICAL DATA: Awoke from sleep with chest pain.

EXAM:
CHEST - 2 VIEW

[chest pa]
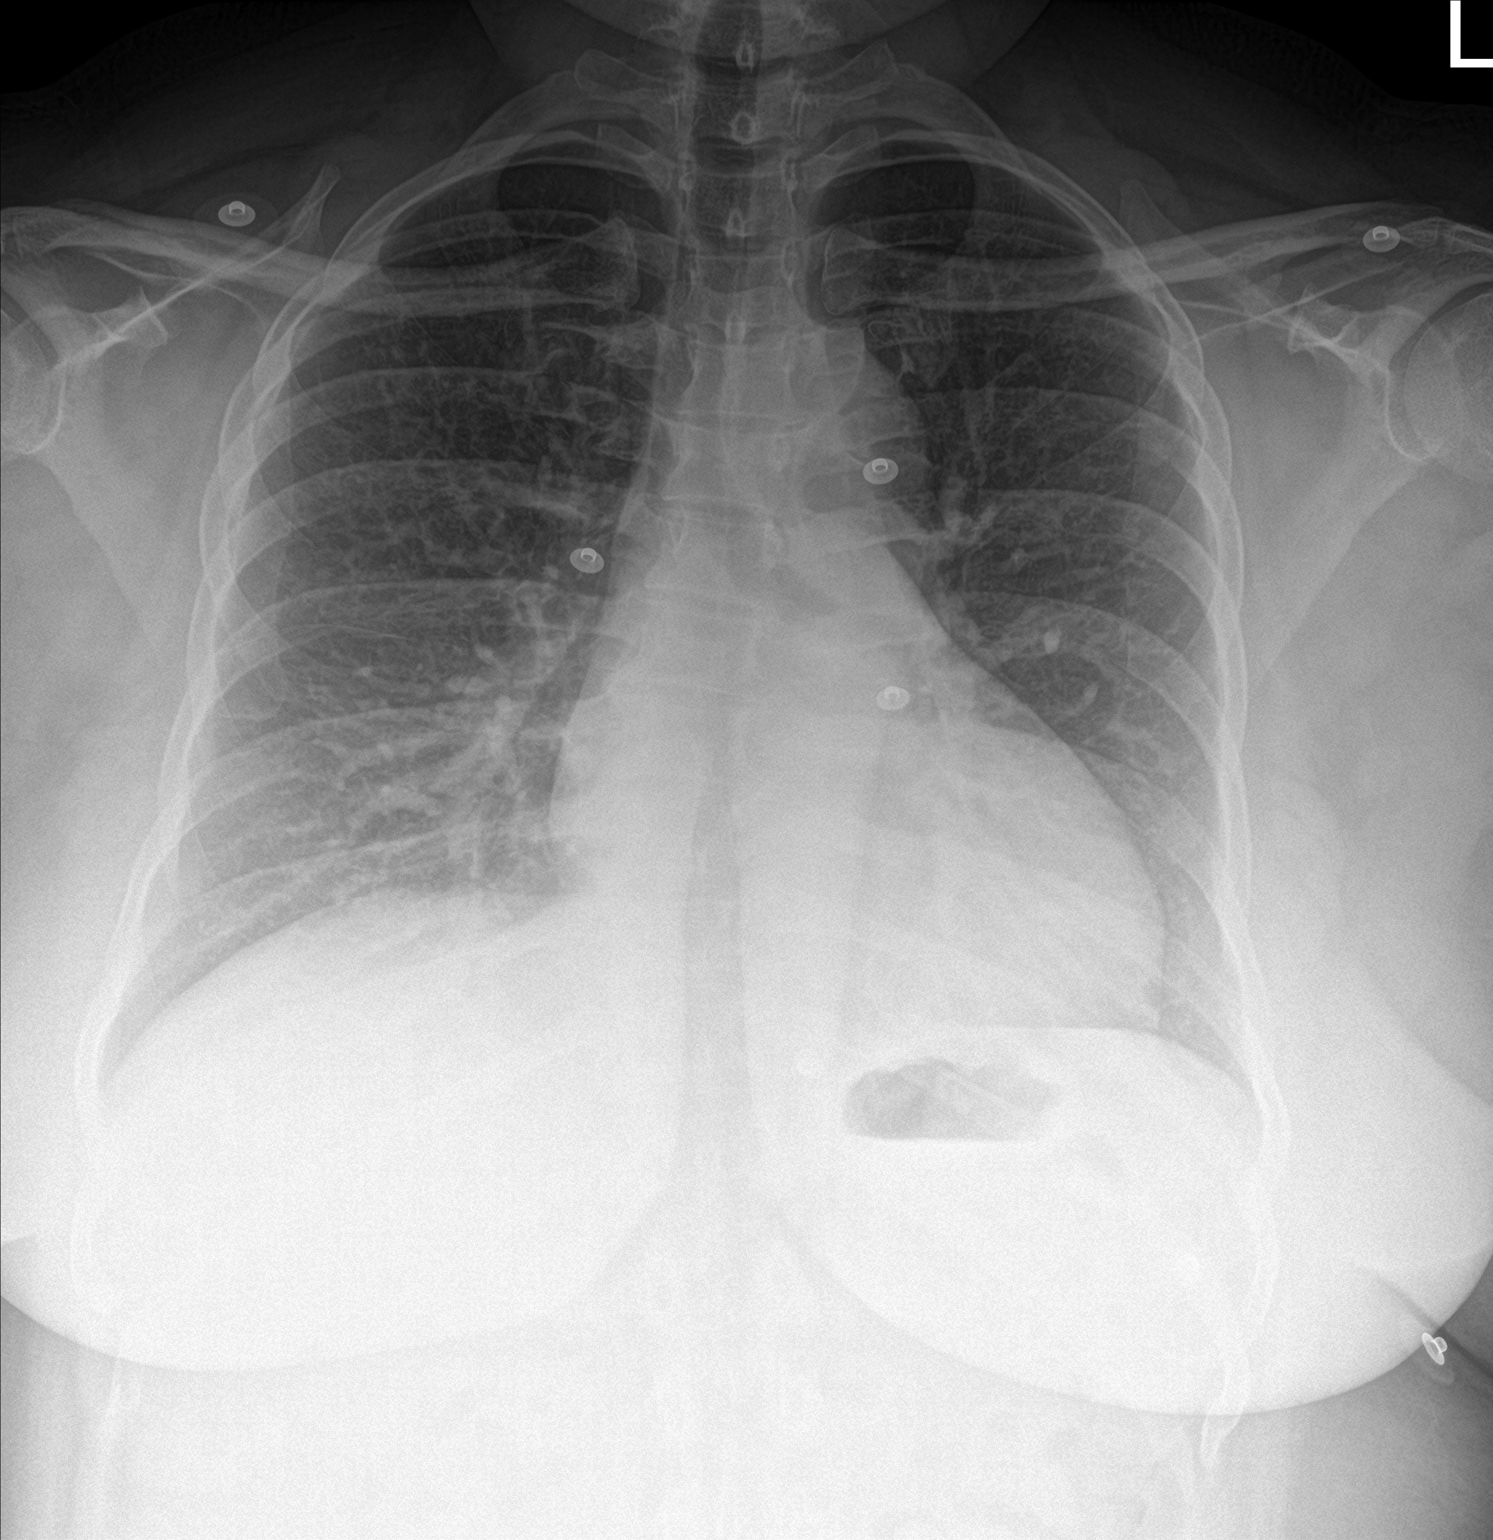

[chest lat]
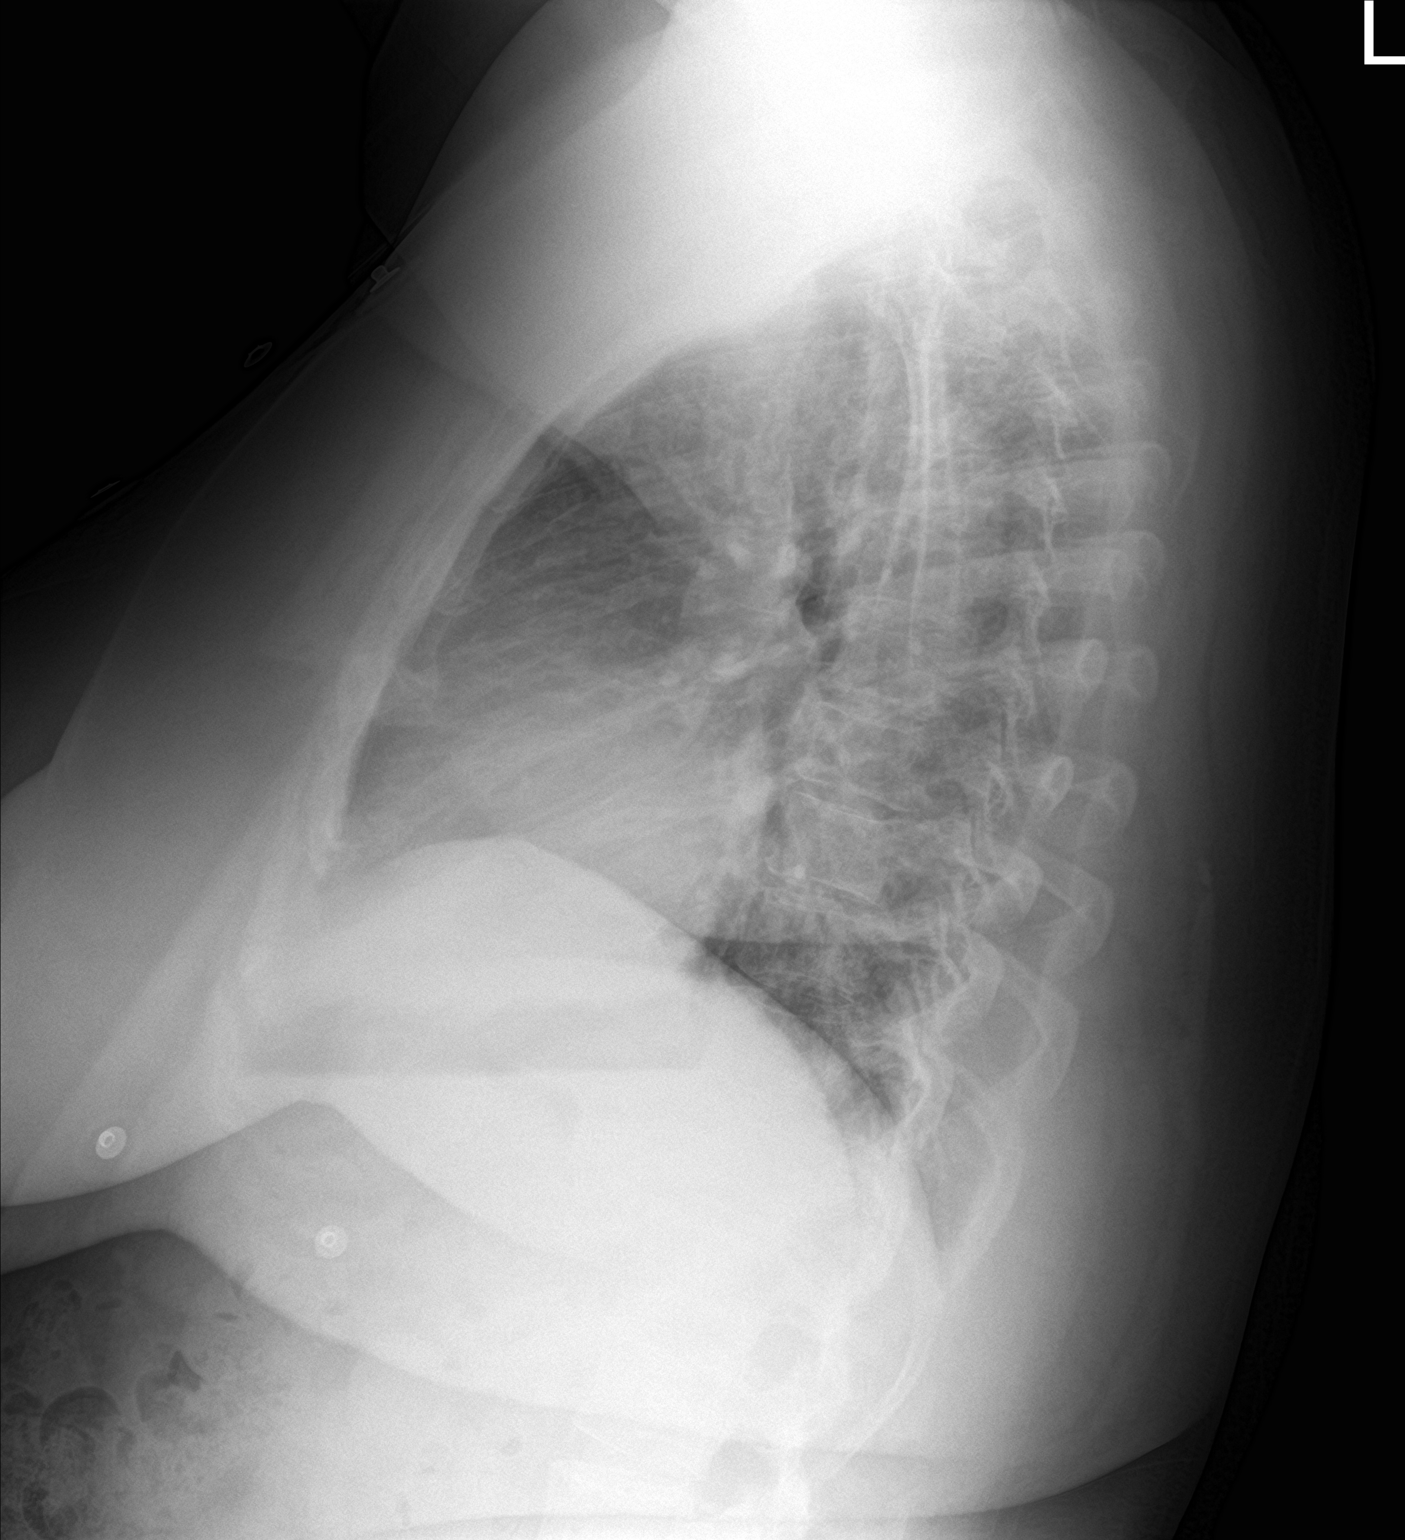

[2 of 2 positions shown; findings below may reference images not displayed]

FINDINGS: The cardiomediastinal contours are normal. Minor subsegmental
bibasilar atelectasis. Pulmonary vasculature is normal. No
consolidation, pleural effusion, or pneumothorax. No acute osseous
abnormalities are seen.
IMPRESSION: Minor subsegmental bibasilar atelectasis.

## 2021-12-16 ENCOUNTER — Ambulatory Visit (INDEPENDENT_AMBULATORY_CARE_PROVIDER_SITE_OTHER): Payer: Medicaid Other | Admitting: Family Medicine

## 2021-12-16 VITALS — BP 138/78 | HR 71 | Wt 210.0 lb

## 2021-12-16 DIAGNOSIS — N898 Other specified noninflammatory disorders of vagina: Secondary | ICD-10-CM | POA: Insufficient documentation

## 2021-12-16 DIAGNOSIS — M545 Low back pain, unspecified: Secondary | ICD-10-CM

## 2021-12-16 DIAGNOSIS — R399 Unspecified symptoms and signs involving the genitourinary system: Secondary | ICD-10-CM | POA: Diagnosis not present

## 2021-12-16 DIAGNOSIS — R1030 Lower abdominal pain, unspecified: Secondary | ICD-10-CM | POA: Diagnosis present

## 2021-12-16 DIAGNOSIS — R350 Frequency of micturition: Secondary | ICD-10-CM | POA: Diagnosis not present

## 2021-12-16 LAB — POCT WET PREP (WET MOUNT)
Clue Cells Wet Prep Whiff POC: NEGATIVE
Trichomonas Wet Prep HPF POC: ABSENT

## 2021-12-16 LAB — POCT URINALYSIS DIP (MANUAL ENTRY)
Bilirubin, UA: NEGATIVE
Blood, UA: NEGATIVE
Glucose, UA: NEGATIVE mg/dL
Ketones, POC UA: NEGATIVE mg/dL
Nitrite, UA: NEGATIVE
Protein Ur, POC: NEGATIVE mg/dL
Spec Grav, UA: 1.02 (ref 1.010–1.025)
Urobilinogen, UA: 0.2 E.U./dL
pH, UA: 6.5 (ref 5.0–8.0)

## 2021-12-16 NOTE — Patient Instructions (Addendum)
It was wonderful to see you today.  Today we talked about:  We did a vaginal swab today to check for bacterial vaginosis and a yeast infection. I will let you know if the results are abnormal. We sent your urine for a culture to see if it shows an infection. If it does, I will send in antibiotics. Continue the Tylenol, lidocaine patches for your back. You can also try heating pads. Stretching may help as well.  Thank you for coming to your visit as scheduled. We have had a large "no-show" problem lately, and this significantly limits our ability to see and care for patients. As a friendly reminder- if you cannot make your appointment please call to cancel. We do have a no show policy for those who do not cancel within 24 hours. Our policy is that if you miss or fail to cancel an appointment within 24 hours, 3 times in a 66-month period, you may be dismissed from our clinic.   Thank you for choosing [facility].   Please call [phone] with any questions about today's appointment.  Please be sure to schedule follow up at the front  desk before you leave today.   Sabino Dick, DO PGY-3 Family Medicine

## 2021-12-16 NOTE — Assessment & Plan Note (Addendum)
Noted while wiping a couple days ago. GU exam also notable for scant white discharge. She declined STI testing today as she has not been sexually active in a year. Wet prep negative. Likely physiologic discharge.

## 2021-12-16 NOTE — Assessment & Plan Note (Signed)
B/l lower back pain without red flag symptoms. No trauma or injury. No previous surgeries. She has a normal back exam with good ROM and no ttp midline or to sides. Symptoms have improved with Tylenol and lidocaine so I suspect muscular component. We discussed continuing conservative treatment with tylenol, lidocaine, heating pads and stretching.

## 2021-12-16 NOTE — Assessment & Plan Note (Addendum)
No dysuria but endorses urinary frequency and urgency. She has had UTI in the past. No fevers or history of pyelonephritis and no CVA tenderness or suprapubic tenderness on examination. She appears well. UA with some leukocytes but otherwise normal. Ordered Ucx but unfortunately there was not enough urine collected. Patient can return if symptoms do not improve or if they worsen.

## 2021-12-16 NOTE — Progress Notes (Signed)
    SUBJECTIVE:   CHIEF COMPLAINT / HPI:   Kendra Harris is a 41 y.o. female who presents to the [facility] clinic today to discuss the following concerns:   Back Pain, Abdominal Pain She was recently seen at the urgent care on 11/5 for left mid back pain without preceding trauma. She has a hx of UTI and had back pain with this. In the UC her UA showed moderate hemoglobin, negative for leukocytes and nitrates.  She was given a dose of Tylenol with improvement in her pain.  She was advised to continue Tylenol, use a lidocaine patch to the area of pain. She reports she has been taking Tylenol and using lidocaine patches as directed and this does help her pain.  She states that when she wiped after voiding a couple days ago she noticed some thick white discharge on 11/6. No dysuria. She feels that she is voiding more frequently. She does feel urinary urgency. Her urinary symptoms started 11/3. She has had urinary tract infections in the past, states that every time they feel different. No fevers. She has had nausea but no vomiting. No hx of kidney infections or stones. She also notes some left and right sided back pain, is wondering if this may be related. No falls.    PERTINENT  PMH / PSH: Hypertension, GERD, MDD  OBJECTIVE:   BP 138/78   Pulse 71   Wt 210 lb (95.3 kg)   LMP 11/14/2021   SpO2 98%   BMI 37.20 kg/m    General: NAD, pleasant, able to participate in exam. Normal gait. Able to get up and down from exam table unassisted.  Cardiac: RRR, no murmurs. Respiratory: CTAB, normal effort, No wheezes, rales or rhonchi Abdomen: Bowel sounds present, soft, mild epigastric tenderness, no suprapubic tenderness, no R/G, nondistended GU: Normal appearance of labia majora and minora, without lesions. Vagina tissue pink, moist, without lesions or abrasions. Cervix normal appearance, non-friable, with scant white discharge from os.  Back: No CVAT. No midline tenderness. No tenderness to either  side. No deformities. Normal AROM in all planes.  Psych: Normal affect and mood  GU exam chaperoned by Ruthy Dick, RN   ASSESSMENT/PLAN:   Vaginal discharge Noted while wiping a couple days ago. GU exam also notable for scant white discharge. She declined STI testing today as she has not been sexually active in a year. Wet prep negative. Likely physiologic discharge.   Urinary symptom or sign No dysuria but endorses urinary frequency and urgency. She has had UTI in the past. No fevers or history of pyelonephritis and no CVA tenderness or suprapubic tenderness on examination. She appears well. UA with some leukocytes but otherwise normal. Ordered Ucx but unfortunately there was not enough urine collected. Patient can return if symptoms do not improve or if they worsen.  Back pain B/l lower back pain without red flag symptoms. No trauma or injury. No previous surgeries. She has a normal back exam with good ROM and no ttp midline or to sides. Symptoms have improved with Tylenol and lidocaine so I suspect muscular component. We discussed continuing conservative treatment with tylenol, lidocaine, heating pads and stretching.     Sabino Dick, DO [facility] [facility]

## 2021-12-17 ENCOUNTER — Other Ambulatory Visit: Payer: Medicaid Other

## 2021-12-19 LAB — URINE CULTURE

## 2021-12-20 ENCOUNTER — Encounter: Payer: Self-pay | Admitting: Family Medicine

## 2022-01-18 ENCOUNTER — Telehealth: Payer: Self-pay

## 2022-01-18 NOTE — Telephone Encounter (Signed)
Returned call to patient. She did not answer. Unable to LVM. Will attempt to reach at later time.   Veronda Prude, RN

## 2022-01-18 NOTE — Telephone Encounter (Signed)
Looks like medication was prescribed as a short course and not meant to be a long-term medication.  I do not think this is a good idea to refill without further evaluation.

## 2022-01-18 NOTE — Telephone Encounter (Signed)
Patient calls nurse line requesting refill on cyclobenzaprine. This is not on current medication list and was prescribed by urgent care. Patient had follow up visit for this concern on 12/16/21.  If appropriate, please send refill to [facility] on [address].   Veronda Prude, RN

## 2022-01-25 ENCOUNTER — Ambulatory Visit: Payer: Medicaid Other | Admitting: Family Medicine

## 2022-01-25 NOTE — Patient Instructions (Incomplete)
It was wonderful to see you today.  Please bring ALL of your medications with you to every visit.   Today we talked about:  **  Thank you for coming to your visit as scheduled. We have had a large "no-show" problem lately, and this significantly limits our ability to see and care for patients. As a friendly reminder- if you cannot make your appointment please call to cancel. We do have a no show policy for those who do not cancel within 24 hours. Our policy is that if you miss or fail to cancel an appointment within 24 hours, 3 times in a 6-month period, you may be dismissed from our clinic.   Thank you for choosing [facility].   Please call [phone] with any questions about today's appointment.  Please be sure to schedule follow up at the front  desk before you leave today.   Luka Stohr, DO PGY-3 Family Medicine   

## 2022-01-25 NOTE — Progress Notes (Deleted)
    SUBJECTIVE:   CHIEF COMPLAINT / HPI:   Kendra Harris is a 41 y.o. female who presents to the [facility] clinic today to discuss the following concerns:   HEADACHE   Onset: *** Location: *** Quality: *** Frequency: *** Precipitating factors: *** rior treatment: ***  Associated Symptoms Nausea/vomiting: {YES/NO/WILD CARDS:18581}  Photophobia/phonophobia: {YES/NO/WILD CARDS:18581}  Tearing of eyes: {YES/NO/WILD CARDS:18581}  Sinus pain/pressure: {YES/NO/WILD CARDS:18581}  Family hx migraine: {YES/NO/WILD RJPVG:68159}  Personal stressors: {YES/NO/WILD CARDS:18581}  Relation to menstrual cycle: {YES/NO/WILD CARDS:18581}   Red Flags Fever: {YES/NO/WILD CARDS:18581}  Neck pain/stiffness: {YES/NO/WILD CARDS:18581}  Vision/speech/swallow/hearing difficulty: {YES/NO/WILD CARDS:18581}  Focal weakness/numbness: {YES/NO/WILD CARDS:18581}  Altered mental status: {YES/NO/WILD CARDS:18581}  Trauma: {YES/NO/WILD CARDS:18581}  New type of headache: {YES/NO/WILD CARDS:18581}  Anticoagulant use: {YES/NO/WILD ELMRA:15183}  H/o cancer/HIV/Pregnancy: {YES/NO/WILD UPBDH:78978}    PERTINENT  PMH / PSH: ***  OBJECTIVE:   There were no vitals taken for this visit. ***  General: NAD, pleasant, able to participate in exam HEENT: Normocephalic, EOMI, PEARRLA, no TMJ dysfunction  Neck: Supple, no meningismus Cardiac: RRR, no murmurs. Respiratory: CTAB, normal effort, No wheezes, rales or rhonchi Neuro: Cranial Nerves: II: Visual Fields are full. PERRL.  III,IV, VI: EOMI without ptosis or diplopia.  V: Facial sensation is symmetric to temperature VII: Facial movement is symmetric.  VIII: hearing is intact to voice X: Palat elevates symmetrically XI: Shoulder shrug is symmetric. XII: tongue is midline without atrophy or fasciculations.  Motor: Tone is normal. Bulk is normal. 5/5 strength was present in all four extremities.  Sensory: Sensation is symmetric to light touch and  temperature in the arms and legs. Deep Tendon Reflexes: 2+ and symmetric in the biceps and patellae.  Plantars: Toes are downgoing bilaterally.  Cerebellar: FNF and HKS are intact bilaterally Psych: Normal affect and mood  ASSESSMENT/PLAN:   No problem-specific Assessment & Plan notes found for this encounter.     Sabino Dick, DO [facility]

## 2022-01-28 ENCOUNTER — Ambulatory Visit: Payer: Medicaid Other

## 2022-01-29 ENCOUNTER — Ambulatory Visit: Payer: Medicaid Other | Admitting: Family Medicine

## 2022-02-05 ENCOUNTER — Encounter: Payer: Self-pay | Admitting: Family Medicine

## 2022-02-05 ENCOUNTER — Ambulatory Visit (INDEPENDENT_AMBULATORY_CARE_PROVIDER_SITE_OTHER): Payer: Medicaid Other | Admitting: Family Medicine

## 2022-02-05 VITALS — BP 123/80 | HR 63 | Ht 63.0 in | Wt 217.2 lb

## 2022-02-05 DIAGNOSIS — S46819S Strain of other muscles, fascia and tendons at shoulder and upper arm level, unspecified arm, sequela: Secondary | ICD-10-CM

## 2022-02-05 DIAGNOSIS — G44209 Tension-type headache, unspecified, not intractable: Secondary | ICD-10-CM

## 2022-02-05 NOTE — Patient Instructions (Addendum)
It was great seeing you today!  Today we discussed your symptoms, this seems to be due to tight muscles in the upper back that are causing you to have tension headaches as well. Attached are some exercises that can help relieve the tension in your back which will improve headaches. Using an ice pack on the back of your head can help cool the nerves there. Use a heating pad and massager on your back. You may take advil for your pain but I do not want this to be a daily habit. Doing a beginner yoga video on youtube can help relieve the tension in other parts of your body as well.   Please follow up at your next scheduled appointment, if anything arises between now and then, please don't hesitate to contact our office.   Thank you for allowing [place] to be a part of your medical care!  Thank you, Dr. Robyne Peers  Also a reminder of our clinic's no-show policy. Please make sure to arrive at least 15 minutes prior to your scheduled appointment time. Please try to cancel before 24 hours if you are not able to make it. If you no-show for 2 appointments then you will be receiving a warning letter. If you no-show after 3 visits, then you may be at risk of being dismissed from our clinic. This is to ensure that everyone is able to be seen in a timely manner. Thank you, we appreciate your assistance with this!

## 2022-02-05 NOTE — Progress Notes (Signed)
    SUBJECTIVE:   CHIEF COMPLAINT / HPI:   Patient presents with bilateral shoulder pain that has been intermittent for the past few years, she has not noticed a particular pattern. When she does get this pain, it lasts for a few days and then resolves before returning. Denies any new or recurring stressors. Occurs about 2 times a week. She has not found any relieving factors, she has tried tylenol, heating pads and icee hot. Certain turning movements can cause some discomfort but otherwise no aggravating factors. Denies vision changes, vomiting and weakness. Headache primarily occurs along her forehead and in the back of her head.   OBJECTIVE:   BP 123/80   Pulse 63   Ht 5\' 3"  (1.6 m)   Wt 217 lb 4 oz (98.5 kg)   LMP 12/28/2021   SpO2 99%   BMI 38.48 kg/m   General: Patient well-appearing, in no acute distress. HEENT: EOMI without pain elicited, no tenderness noted upon palpation of facial area CV: RRR, no murmurs or gallops auscultated Resp: CTAB, no wheezing, rales or rhonchi noted MSK: no tender points noted upon palpation of shoulders bilaterally, back or neck, tight tissue texture changes noted trapezius muscles bilaterally, full active ROM of neck and shoulders bilaterally  Neuro: CN 2-12 grossly intact, 5/5 UE and LE strength bilaterally, gross sensation intact, normal gait  Psych: mood appropriate, pleasant   ASSESSMENT/PLAN:   Tension headache -seems to be from tight trapezius muscles bilaterally, discussed this along with conservative measures extensively including use of heating pad, massager, appropriate use of ibuprofen and yoga. Neurological exam unremarkable   Trapezius muscle strain -causing tension headache, see above -handout on stretching exercise rehab provided and discussed    -PHQ-9 score of 2 with negative question 9 reviewed.   12/30/2021, DO [facility] [facility]

## 2022-02-07 DIAGNOSIS — S46819A Strain of other muscles, fascia and tendons at shoulder and upper arm level, unspecified arm, initial encounter: Secondary | ICD-10-CM | POA: Insufficient documentation

## 2022-02-07 NOTE — Assessment & Plan Note (Signed)
-  causing tension headache, see above -handout on stretching exercise rehab provided and discussed

## 2022-02-07 NOTE — Assessment & Plan Note (Signed)
-  seems to be from tight trapezius muscles bilaterally, discussed this along with conservative measures extensively including use of heating pad, massager, appropriate use of ibuprofen and yoga. Neurological exam unremarkable

## 2022-04-02 ENCOUNTER — Other Ambulatory Visit: Payer: Self-pay | Admitting: Family Medicine

## 2022-04-02 DIAGNOSIS — I10 Essential (primary) hypertension: Secondary | ICD-10-CM

## 2022-04-14 ENCOUNTER — Ambulatory Visit: Payer: Medicaid Other | Admitting: Family Medicine

## 2022-04-23 ENCOUNTER — Ambulatory Visit (INDEPENDENT_AMBULATORY_CARE_PROVIDER_SITE_OTHER): Payer: Medicaid Other | Admitting: Student

## 2022-04-23 VITALS — BP 110/74 | HR 64 | Ht 63.0 in | Wt 219.8 lb

## 2022-04-23 DIAGNOSIS — Z3200 Encounter for pregnancy test, result unknown: Secondary | ICD-10-CM | POA: Diagnosis present

## 2022-04-23 LAB — POCT URINE PREGNANCY: Preg Test, Ur: NEGATIVE

## 2022-04-23 NOTE — Assessment & Plan Note (Signed)
Negative urine pregnancy, very unlikely pregnancy given not sexually active within the last month.  She was glad to hear this news as she does want to have children in the future but not at this time.  Attempted to discuss birth control methods however she is not interested in starting any at this time.  I encouraged her to continue with condom use and should she want to discuss further, I would be happy to have that conversation.  Suspect her period is late and she may follow-up as needed.  Advised her to discuss with PCP prior to attempting to get pregnant as she is on medications which should not be continued during pregnancy.

## 2022-04-23 NOTE — Progress Notes (Signed)
  SUBJECTIVE:   CHIEF COMPLAINT / HPI:   Concern for pregnancy: LMP 03/20/22, last sexually active 2/15, does endorse regular periods every 28 days.  She does use condoms regularly.  She has been on depo and OCPs. Depo made her moody and the OCPs made her gain weight.  She is not interested in discussing IUDs or Nexplanon or further methods of birth control at this time despite her not wanting to be pregnant.  Endorses nausea during the morning but also notes she has GERD which may have impacted this.   PERTINENT  PMH / PSH: HTN, GERD  Patient Care Team: Zola Button, MD as PCP - General (Family Medicine) OBJECTIVE:  BP 110/74   Pulse 64   Ht 5\' 3"  (1.6 m)   Wt 219 lb 12.8 oz (99.7 kg)   LMP 03/20/2022 (Within Days)   SpO2 100%   BMI 38.94 kg/m  General: Well-appearing, NAD CV: RRR, no murmurs auscultated Pulm: CTAB, normal WOB Abdomen: Soft, nontender, normoactive bowel sounds Skin: 5 mm comedone without erythema or inflammation, nontender, no drainage in center of chest between breast consistent with acne  ASSESSMENT/PLAN:  Possible pregnancy Assessment & Plan: Negative urine pregnancy, very unlikely pregnancy given not sexually active within the last month.  She was glad to hear this news as she does want to have children in the future but not at this time.  Attempted to discuss birth control methods however she is not interested in starting any at this time.  I encouraged her to continue with condom use and should she want to discuss further, I would be happy to have that conversation.  Suspect her period is late and she may follow-up as needed.  Advised her to discuss with PCP prior to attempting to get pregnant as she is on medications which should not be continued during pregnancy.  Orders: -     POCT urine pregnancy  Benign comedone in center of chest  -No signs of infection, suspect benign acne  Return if symptoms worsen or fail to improve. Wells Guiles, DO 04/23/2022,  9:56 AM PGY-2, [place]

## 2022-04-23 NOTE — Patient Instructions (Signed)
Patient declined AVS 

## 2022-04-29 ENCOUNTER — Ambulatory Visit (INDEPENDENT_AMBULATORY_CARE_PROVIDER_SITE_OTHER): Payer: Medicaid Other | Admitting: Family Medicine

## 2022-04-29 VITALS — BP 122/74 | HR 67 | Ht 62.0 in | Wt 221.4 lb

## 2022-04-29 DIAGNOSIS — Z1231 Encounter for screening mammogram for malignant neoplasm of breast: Secondary | ICD-10-CM

## 2022-04-29 DIAGNOSIS — R300 Dysuria: Secondary | ICD-10-CM | POA: Diagnosis not present

## 2022-04-29 DIAGNOSIS — L720 Epidermal cyst: Secondary | ICD-10-CM

## 2022-04-29 DIAGNOSIS — N926 Irregular menstruation, unspecified: Secondary | ICD-10-CM | POA: Diagnosis not present

## 2022-04-29 DIAGNOSIS — D649 Anemia, unspecified: Secondary | ICD-10-CM | POA: Diagnosis not present

## 2022-04-29 DIAGNOSIS — S46819D Strain of other muscles, fascia and tendons at shoulder and upper arm level, unspecified arm, subsequent encounter: Secondary | ICD-10-CM

## 2022-04-29 DIAGNOSIS — I10 Essential (primary) hypertension: Secondary | ICD-10-CM

## 2022-04-29 LAB — POCT URINALYSIS DIP (CLINITEK)
Bilirubin, UA: NEGATIVE
Blood, UA: NEGATIVE
Glucose, UA: NEGATIVE mg/dL
Ketones, POC UA: NEGATIVE mg/dL
Leukocytes, UA: NEGATIVE
Nitrite, UA: NEGATIVE
POC PROTEIN,UA: NEGATIVE
Spec Grav, UA: 1.015 (ref 1.010–1.025)
Urobilinogen, UA: 0.2 E.U./dL
pH, UA: 5.5 (ref 5.0–8.0)

## 2022-04-29 LAB — POCT URINE PREGNANCY: Preg Test, Ur: NEGATIVE

## 2022-04-29 NOTE — Patient Instructions (Addendum)
It was nice seeing you today!  I am referring you to physical therapy.  They should be calling you with appointment in the next 2 weeks.  I think you have had a benign cyst called an epidermoid cyst.  This is basically fluid buildup in one of your hair follicles. This does not necessarily need to be treated, but if you would like this removed, we can schedule an appointment with our dermatology clinic to have this removed.  Stay well, Zola Button, MD [place] [phone]  --  Make sure to check out at the front desk before you leave today.  Please arrive at least 15 minutes prior to your scheduled appointments.  If you had blood work today, I will send you a MyChart message or a letter if results are normal. Otherwise, I will give you a call.  If you had a referral placed, they will call you to set up an appointment. Please give [place] a call if you don't hear back in the next 2 weeks.  If you need additional refills before your next appointment, please call your pharmacy first.

## 2022-04-29 NOTE — Assessment & Plan Note (Signed)
Ongoing problem, did not assess in depth today.  Refer to PT and she will let me know if not getting better.

## 2022-04-29 NOTE — Assessment & Plan Note (Signed)
History of anemia we will repeat CBC today

## 2022-04-29 NOTE — Progress Notes (Signed)
SUBJECTIVE:   CHIEF COMPLAINT / HPI:  Chief Complaint  Patient presents with   Follow-up    Itchy bump between the breast for about 2 weeks. Was seen last week in clinic, felt to be comedonal acne. Denies fever, chills, drainage. Has not tried anything. Wants to just make sure that there is nothing serious going on.  Reports mild dysuria and malodorous urine for 2 days. Denies urinary frequency, urgency.  Missed period, LMP 2/10. Declines contraception.  Reports chronic upper back discomfort, feels tightness in her shoulder. Not limiting her daily activities, more of a tightness. Recently diagnosed with trapezius strain. She has been doing home exercises as recommended by still feeling tight.  Interested in physical therapy.  She is no longer working at [employer], currently looking for a new job.  PERTINENT  PMH / PSH: Hypertension, anemia, GERD, depression  Patient Care Team: Zola Button, MD as PCP - General (Family Medicine)   OBJECTIVE:   BP 122/74   Pulse 67   Ht 5\' 2"  (1.575 m)   Wt 221 lb 6.4 oz (100.4 kg)   LMP 03/20/2022 (Within Days)   SpO2 99%   BMI 40.49 kg/m   Physical Exam Exam conducted with a chaperone present.  Constitutional:      General: She is not in acute distress. Cardiovascular:     Rate and Rhythm: Normal rate and regular rhythm.  Pulmonary:     Effort: Pulmonary effort is normal. No respiratory distress.     Breath sounds: Normal breath sounds.  Abdominal:     Tenderness: There is no right CVA tenderness or left CVA tenderness.  Musculoskeletal:     Cervical back: Neck supple.     Comments: No tenderness along midline spine or paraspinal muscles or along trapezius region.  Skin:    Comments: Center of chest wall between breasts there is a approximately 1 cm mobile mass.  Overlying hyperpigmentation without erythema.  Nontender.  Neurological:     Mental Status: She is alert.         {Show previous vital signs  (optional):23777}    ASSESSMENT/PLAN:   Problem List Items Addressed This Visit       Cardiovascular and Mediastinum   Essential hypertension, benign    Well-controlled today, no changes to medications.  Check metabolic panel today.      Relevant Orders   Basic Metabolic Panel     Musculoskeletal and Integument   Trapezius muscle strain    Ongoing problem, did not assess in depth today.  Refer to PT and she will let me know if not getting better.      Relevant Orders   Ambulatory referral to Physical Therapy   Epidermoid cyst of skin of chest - Primary    Cystic mass on chest wall present for about 2 weeks.  Minimally bothersome.  Discussed that we can remove this if desired, she would like to hold off for now.        Other   Anemia    History of anemia we will repeat CBC today      Relevant Orders   CBC   Other Visit Diagnoses     Dysuria       Relevant Orders   POCT URINALYSIS DIP (CLINITEK) (Completed)   Late period       Relevant Orders   POCT urine pregnancy (Completed)   Encounter for screening mammogram for malignant neoplasm of breast       Relevant Orders  MM DIGITAL SCREENING BILATERAL       Pregnancy test negative.  Discussed contraception, patient declines. Minimal dysuria, UA does not suggest infection.  Offered to send off urine culture but patient declined.    Return in about 3 months (around 07/30/2022) for physical.   Zola Button, MD [place]

## 2022-04-29 NOTE — Assessment & Plan Note (Signed)
Well-controlled today, no changes to medications.  Check metabolic panel today.

## 2022-04-29 NOTE — Assessment & Plan Note (Signed)
Cystic mass on chest wall present for about 2 weeks.  Minimally bothersome.  Discussed that we can remove this if desired, she would like to hold off for now.

## 2022-04-30 ENCOUNTER — Ambulatory Visit: Payer: Medicaid Other

## 2022-05-05 ENCOUNTER — Other Ambulatory Visit: Payer: Self-pay | Admitting: Family Medicine

## 2022-05-05 DIAGNOSIS — I10 Essential (primary) hypertension: Secondary | ICD-10-CM

## 2022-06-25 ENCOUNTER — Ambulatory Visit: Payer: Medicaid Other | Admitting: Family Medicine

## 2022-09-14 ENCOUNTER — Other Ambulatory Visit: Payer: Self-pay | Admitting: Family Medicine

## 2022-09-14 DIAGNOSIS — G44209 Tension-type headache, unspecified, not intractable: Secondary | ICD-10-CM

## 2022-09-15 ENCOUNTER — Other Ambulatory Visit: Payer: Self-pay

## 2022-09-15 DIAGNOSIS — I10 Essential (primary) hypertension: Secondary | ICD-10-CM

## 2022-09-16 MED ORDER — AMLODIPINE BESYLATE 10 MG PO TABS
ORAL_TABLET | ORAL | 0 refills | Status: DC
Start: 2022-09-16 — End: 2022-11-18

## 2022-09-23 ENCOUNTER — Encounter (HOSPITAL_COMMUNITY): Payer: Self-pay

## 2022-09-23 ENCOUNTER — Ambulatory Visit (HOSPITAL_COMMUNITY)
Admission: RE | Admit: 2022-09-23 | Discharge: 2022-09-23 | Disposition: A | Discharge status: Home / Self Care | Payer: MEDICAID | Source: Ambulatory Visit | Attending: Family Medicine | Admitting: Family Medicine

## 2022-09-23 VITALS — BP 111/78 | HR 78 | Temp 98.7°F | Resp 16

## 2022-09-23 DIAGNOSIS — R350 Frequency of micturition: Secondary | ICD-10-CM

## 2022-09-23 LAB — POCT URINALYSIS DIP (MANUAL ENTRY)
Bilirubin, UA: NEGATIVE
Blood, UA: NEGATIVE
Glucose, UA: NEGATIVE mg/dL
Ketones, POC UA: NEGATIVE mg/dL
Nitrite, UA: NEGATIVE
Protein Ur, POC: NEGATIVE mg/dL
Spec Grav, UA: >=1.03 — AB (ref 1.010–1.025)
Urobilinogen, UA: 0.2 E.U./dL
pH, UA: 5.5 (ref 5.0–8.0)

## 2022-09-23 LAB — POCT URINE PREGNANCY: Preg Test, Ur: NEGATIVE

## 2022-09-23 MED ORDER — CEPHALEXIN 500 MG PO CAPS: 500.0000 mg | ORAL_CAPSULE | Freq: Two times a day (BID) | ORAL | 0 refills | Status: DC

## 2022-09-23 NOTE — ED Triage Notes (Signed)
Pt c/o intermittent abd and back pains that is bilateral for 4 days. Denies urinary problems.  Pt requesting pregnancy test. LMP 7/21.

## 2022-09-23 NOTE — Discharge Instructions (Signed)
You have had labs (urine culture) sent today. We will call you with any significant abnormalities or if there is need to begin or change treatment or pursue further follow up.  You may also review your test results online through MyChart. If you do not have a MyChart account, instructions to sign up should be on your discharge paperwork.  

## 2022-09-24 LAB — URINE CULTURE: Culture: <10000 — AB

## 2022-09-29 ENCOUNTER — Encounter (HOSPITAL_COMMUNITY): Payer: Self-pay

## 2022-09-29 ENCOUNTER — Ambulatory Visit (HOSPITAL_COMMUNITY)
Admission: RE | Admit: 2022-09-29 | Discharge: 2022-09-29 | Disposition: A | Discharge status: Home / Self Care | Payer: MEDICAID | Source: Ambulatory Visit | Attending: Emergency Medicine | Admitting: Emergency Medicine

## 2022-09-29 VITALS — BP 119/82 | HR 65 | Temp 98.1°F | Resp 18

## 2022-09-29 DIAGNOSIS — R1013 Epigastric pain: Secondary | ICD-10-CM

## 2022-09-29 DIAGNOSIS — K2100 Gastro-esophageal reflux disease with esophagitis, without bleeding: Secondary | ICD-10-CM | POA: Diagnosis not present

## 2022-09-29 MED ORDER — LIDOCAINE VISCOUS HCL 2 % MT SOLN
15.0000 mL | Freq: Once | OROMUCOSAL | Status: AC
  Administered 2022-09-29: 15 mL via OROMUCOSAL

## 2022-09-29 MED ORDER — PANTOPRAZOLE SODIUM 40 MG PO TBEC: 40.0000 mg | DELAYED_RELEASE_TABLET | Freq: Every day | ORAL | 0 refills | Status: DC

## 2022-09-29 MED ORDER — LIDOCAINE VISCOUS HCL 2 % MT SOLN
OROMUCOSAL | Status: AC
  Filled 2022-09-29: qty 15

## 2022-09-29 MED ORDER — FAMOTIDINE 20 MG PO TABS: 20.0000 mg | ORAL_TABLET | Freq: Two times a day (BID) | ORAL | 0 refills | Status: DC

## 2022-09-29 MED ORDER — ALUM & MAG HYDROXIDE-SIMETH 200-200-20 MG/5ML PO SUSP
ORAL | Status: AC
  Filled 2022-09-29: qty 30

## 2022-09-29 MED ORDER — ALUM & MAG HYDROXIDE-SIMETH 200-200-20 MG/5ML PO SUSP
30.0000 mL | Freq: Once | ORAL | Status: AC
  Administered 2022-09-29: 30 mL via ORAL

## 2022-09-29 NOTE — Discharge Instructions (Addendum)
Your EKG was reassuring.  I suspect that this is acid reflux.  Start the [employer] and Protonix.  Please follow-up with your primary care provider in 3 days to make sure that you are getting better.  Go to the ER for the signs and symptoms we discussed.

## 2022-09-29 NOTE — ED Triage Notes (Signed)
Pt c/o upper abdominal pain with bloating and nausea x1wk. States hx of acid reflux and this feels the same. Took OTC meds with no relief.

## 2022-09-29 NOTE — ED Provider Notes (Signed)
HPI  SUBJECTIVE:  Kendra Harris is a 42 y.o. female who presents with 1 week of dull, intermittent, hours long epigastric pain accompanied with indigestion, nausea, belching, waterbrash, burning chest pain and sensation of being bloated.  It does not migrate, radiate up her neck, down her arm, or through to her back.  She denies chest pain, palpitations, shortness of breath, diaphoresis, coughing, wheezing, abdominal distention, fevers.  She denies chest pressure,  heaviness, or pain worse with exertion.  She has had symptoms like this before and states this is identical to previous GERD.  She has tried [employer], Zantac.  Symptoms are better with drinking cold water.  Symptoms are worse 1 hour after eating where she will have epigastric pain that lasts approximately an hour.  She also states her symptoms are worse with lying down-radiates into her chest.  She has a past medical history of GERD, hypertension and BMI above 30.  No history of coronary disease, MI, diabetes, hypercholesterolemia, smoking, CVA, PAD/PVD.  Family history negative for early MI.  LMP: Saturday.  Denies the possibility of being pregnant.  [facility].    Past Medical History:  Diagnosis Date   DELAYED MENSES 02/11/2010   Qualifier: Diagnosis of  By: Wallene Huh  MD, Khary     Hypertension    MIGRAINE HEADACHE 12/13/2007   Urinary tract infection 03/04/2011    Past Surgical History:  Procedure Laterality Date   NO PAST SURGERIES      Family History  Problem Relation Age of Onset   Sickle cell trait Sister     Social History   Tobacco Use   Smoking status: Never    Passive exposure: Never   Smokeless tobacco: Never  Vaping Use   Vaping status: Never Used  Substance Use Topics   Alcohol use: No   Drug use: No    No current facility-administered medications for this encounter.  Current Outpatient Medications:    famotidine (PEPCID) 20 MG tablet, Take 1 tablet (20 mg total) by mouth 2  (two) times daily., Disp: 40 tablet, Rfl: 0   pantoprazole (PROTONIX) 40 MG tablet, Take 1 tablet (40 mg total) by mouth daily., Disp: 20 tablet, Rfl: 0   amLODipine (NORVASC) 10 MG tablet, TAKE 1 TABLET(10 MG) BY MOUTH DAILY, Disp: 30 tablet, Rfl: 0   losartan-hydrochlorothiazide (HYZAAR) 100-25 MG tablet, TAKE 1 TABLET BY MOUTH DAILY, Disp: 30 tablet, Rfl: 0  No Known Allergies   ROS  As noted in HPI.   Physical Exam  BP 119/82 (BP Location: Right Arm)   Pulse 65   Temp 98.1 F (36.7 C) (Oral)   Resp 18   LMP 09/26/2022 (Exact Date)   SpO2 98%   Constitutional: Well developed, well nourished, no acute distress Eyes:  EOMI, conjunctiva normal bilaterally HENT: Normocephalic, atraumatic,mucus membranes moist Respiratory: Normal inspiratory effort, lungs clear bilaterally. Cardiovascular: Normal rate, regular rhythm, no no murmurs, rubs, gallops. GI: nondistended, soft, normal appearance, no rebound or guarding.  Positive epigastric and left upper quadrant tenderness.  No pulsatile palpable abdominal mass.  Active bowel sounds. skin: No rash, skin intact Musculoskeletal: no deformities Neurologic: Alert & oriented x 3, no focal neuro deficits Psychiatric: Speech and behavior appropriate   ED Course   Medications  alum & mag hydroxide-simeth (MAALOX/MYLANTA) 200-200-20 MG/5ML suspension 30 mL (30 mLs Oral Given 09/29/22 1109)  lidocaine (XYLOCAINE) 2 % viscous mouth solution 15 mL (15 mLs Mouth/Throat Given 09/29/22 1109)    Orders Placed This  Encounter  Procedures   ED EKG    heartburn    Standing Status:   Standing    Number of Occurrences:   1    Order Specific Question:   Reason for Exam    Answer:   Other (See Comments)    No results found for this or any previous visit (from the past 24 hour(s)). No results found.  ED Clinical Impression  1. Abdominal pain, epigastric   2. Gastroesophageal reflux disease with esophagitis without hemorrhage      ED  Assessment/Plan     EKG: Normal sinus rhythm, rate 71.  Normal axis, normal intervals.  No hypertrophy.  No ST-T wave changes.  No change compared to EKG from December 2022. EKG obtained while symptomatic.  Abdomen soft, she has epigastric and left upper quadrant tenderness.  Doubt perforation, mesenteric ischemia, leaking aortic abdominal aneurysm at this time.  This appears to be acid reflux/gastritis.  States that she has had identical symptoms before and was diagnosed as such.  Her EKG is reassuring.  Will start her on Pepcid and Protonix and have her follow-up with her primary care provider in several days.  Strict ER return precautions given.  Discussed  EKG, MDM, treatment plan, and plan for follow-up with patient. Discussed sn/sx that should prompt return to the ED. patient agrees with plan.   Meds ordered this encounter  Medications   alum & mag hydroxide-simeth (MAALOX/MYLANTA) 200-200-20 MG/5ML suspension 30 mL   lidocaine (XYLOCAINE) 2 % viscous mouth solution 15 mL   pantoprazole (PROTONIX) 40 MG tablet    Sig: Take 1 tablet (40 mg total) by mouth daily.    Dispense:  20 tablet    Refill:  0   famotidine (PEPCID) 20 MG tablet    Sig: Take 1 tablet (20 mg total) by mouth 2 (two) times daily.    Dispense:  40 tablet    Refill:  0      *This clinic note was created using Scientist, clinical (histocompatibility and immunogenetics). Therefore, there may be occasional mistakes despite careful proofreading.  ?    Domenick Gong, MD 09/30/22 1233

## 2022-10-05 ENCOUNTER — Ambulatory Visit: Payer: MEDICAID

## 2022-10-05 NOTE — Progress Notes (Deleted)
  SUBJECTIVE:   CHIEF COMPLAINT / HPI:   F/u Urgent Care  -Pt seen 09/29/22 in UC for GERD symptoms, tx w/ pepcid and pantoprazole  PERTINENT  PMH / PSH: ***  Past Medical History:  Diagnosis Date   DELAYED MENSES 02/11/2010   Qualifier: Diagnosis of  By: Wallene Huh  MD, Khary     Hypertension    MIGRAINE HEADACHE 12/13/2007   Urinary tract infection 03/04/2011    Patient Care Team: Para March, DO as PCP - General (Family Medicine) OBJECTIVE:  LMP 09/26/2022 (Exact Date)  Physical Exam   ASSESSMENT/PLAN:  There are no diagnoses linked to this encounter. No follow-ups on file. Bess Kinds, MD 10/05/2022, 7:34 AM PGY-***, [facility] {    This will disappear when note is signed, click to select method of visit    :1}

## 2022-10-06 ENCOUNTER — Ambulatory Visit: Payer: MEDICAID

## 2022-10-07 NOTE — ED Provider Notes (Signed)
  [facility]    ASSESSMENT & PLAN:  1. Urinary frequency    Begin: Meds ordered this encounter  Medications   cephALEXin (KEFLEX) 500 MG capsule    Sig: Take 1 capsule (500 mg total) by mouth 2 (two) times daily.    Dispense:  10 capsule    Refill:  0   No signs of pyelonephritis. Urine culture sent. Will notify patient of any significant results. Ensure proper hydration. Will follow up with her PCP or here if not showing improvement over the next 48 hours, sooner if needed.  Outlined signs and symptoms indicating need for more acute intervention. Patient verbalized understanding. After Visit Summary given.  SUBJECTIVE:  Kendra Harris is a 42 y.o. female who complains of urinary frequency, urgency and dysuria for the past 3-4 days. Without associated flank pain, fever, chills, vaginal discharge or bleeding. Gross hematuria: not present. No specific aggravating or alleviating factors reported. No LE edema. Normal PO intake without n/v/d. Without specific abdominal pain. Ambulatory without difficulty. LMP: Patient's last menstrual period was 08/29/2022.  OBJECTIVE:  Vitals:   09/23/22 1606  BP: 111/78  Pulse: 78  Resp: 16  Temp: 98.7 F (37.1 C)  TempSrc: Oral  SpO2: 98%   General appearance: alert; no distress HENT: oropharynx: moist Lungs: unlabored respirations Abdomen: soft, non-tender; bowel sounds normal; no masses or organomegaly; no guarding or rebound tenderness Back: no CVA tenderness Extremities: no edema; symmetrical with no gross deformities Skin: warm and dry Neurologic: normal gait Psychological: alert and cooperative; normal mood and affect   No Known Allergies  Past Medical History:  Diagnosis Date   DELAYED MENSES 02/11/2010   Qualifier: Diagnosis of  By: Wallene Huh  MD, Khary     Hypertension    MIGRAINE HEADACHE 12/13/2007   Urinary tract infection 03/04/2011   Social History   Socioeconomic History   Marital status:  Single    Spouse name: Not on file   Number of children: Not on file   Years of education: Not on file   Highest education level: Not on file  Occupational History   Not on file  Tobacco Use   Smoking status: Never    Passive exposure: Never   Smokeless tobacco: Never  Vaping Use   Vaping status: Never Used  Substance and Sexual Activity   Alcohol use: No   Drug use: No   Sexual activity: Yes    Birth control/protection: Condom  Other Topics Concern   Not on file  Social History Narrative   Not on file   Social Determinants of Health   Financial Resource Strain: Not on file  Food Insecurity: Not on file  Transportation Needs: Not on file  Physical Activity: Not on file  Stress: Not on file  Social Connections: Not on file  Intimate Partner Violence: Not on file   Family History  Problem Relation Age of Onset   Sickle cell trait Sister         Mardella Layman, MD 10/07/22 [phone]

## 2022-10-17 ENCOUNTER — Other Ambulatory Visit: Payer: Self-pay | Admitting: Family Medicine

## 2022-10-17 DIAGNOSIS — G44209 Tension-type headache, unspecified, not intractable: Secondary | ICD-10-CM

## 2022-10-21 ENCOUNTER — Ambulatory Visit: Payer: Medicaid Other | Admitting: Family Medicine

## 2022-10-21 NOTE — Progress Notes (Deleted)
    SUBJECTIVE:   CHIEF COMPLAINT / HPI:   Essential hypertension - Amlodipine 10mg  every day, Losartan-hydrochlorothiazide 100-25mg  QD  GERD - Pepcid 20mg  every day, protonix 40mg  QD  MDD   PERTINENT  PMH / PSH: ***  OBJECTIVE:   LMP 09/26/2022 (Exact Date)   ***  ASSESSMENT/PLAN:   No problem-specific Assessment & Plan notes found for this encounter.     Para March, DO [facility]

## 2022-11-18 ENCOUNTER — Ambulatory Visit: Payer: MEDICAID | Admitting: Family Medicine

## 2022-11-18 ENCOUNTER — Encounter: Payer: Self-pay | Admitting: Family Medicine

## 2022-11-18 VITALS — BP 132/81 | HR 65 | Ht 62.0 in | Wt 210.4 lb

## 2022-11-18 DIAGNOSIS — Z3200 Encounter for pregnancy test, result unknown: Secondary | ICD-10-CM | POA: Diagnosis not present

## 2022-11-18 DIAGNOSIS — I10 Essential (primary) hypertension: Secondary | ICD-10-CM

## 2022-11-18 DIAGNOSIS — N898 Other specified noninflammatory disorders of vagina: Secondary | ICD-10-CM

## 2022-11-18 LAB — POCT WET PREP (WET MOUNT)
Clue Cells Wet Prep Whiff POC: NEGATIVE
Trichomonas Wet Prep HPF POC: ABSENT

## 2022-11-18 LAB — POCT URINE PREGNANCY: Preg Test, Ur: NEGATIVE

## 2022-11-18 MED ORDER — LABETALOL HCL 100 MG PO TABS: 100.0000 mg | ORAL_TABLET | Freq: Two times a day (BID) | ORAL | 0 refills | Status: DC

## 2022-11-18 MED ORDER — AMLODIPINE BESYLATE 10 MG PO TABS
ORAL_TABLET | ORAL | 0 refills | Status: DC
Start: 2022-11-18 — End: 2023-01-12

## 2022-11-18 MED ORDER — PREPLUS 27-1 MG PO TABS
1.0000 | ORAL_TABLET | Freq: Every day | ORAL | 13 refills | Status: DC
Start: 2022-11-18 — End: 2023-03-05

## 2022-11-18 NOTE — Patient Instructions (Addendum)
It was wonderful to see you today.  Please bring ALL of your medications with you to every visit.   Today we talked about:  Spotting - It could possibly be due to a yeast infection. I will let you know the results of the test.   Desire of pregnancy - please start taking prenatal vitamins. I have switched your blood pressure medication to labetalol. Please STOP taking Hyzaar. Start discussing genetic testing with your partner.   Thank you for choosing [facility].   Please call [phone] with any questions about today's appointment.  Please be sure to schedule follow up at the front desk before you leave today.   Lockie Mola, MD  Family Medicine

## 2022-11-18 NOTE — Progress Notes (Signed)
    SUBJECTIVE:   CHIEF COMPLAINT / HPI:   Irregular Periods  Patient had regular period last month. Monday her period came on for two the three hours, very light pink spotting. Then did not have any bleeding until this morning. This morning started out light and then got heavier. Still not the flow of a normal period at this time. No vulvar irritation. No new sexual partners. Had sex exactly one month ago. This would be a week early for her period. Her cycle usually lasts about 30 days. She does endorse some thick white discharge starting a couple days ago. No antibiotics or steroids recently.   Desire for Pregnancy  Patient has been attempting to get pregnant; thus, is not on any contraceptive agents. Has had chronic hypertension, preeclampsia, and gestational diabetes with past pregnancies. Is also on losartan-hydrochlorothiazide at this time. She took labetalol during her other pregnancies.   PERTINENT  PMH / PSH: MDD with psychotic features, HTN   OBJECTIVE:   BP 132/81   Pulse 65   Ht 5\' 2"  (1.575 m)   Wt 210 lb 6 oz (95.4 kg)   LMP 11/15/2022   SpO2 100%   BMI 38.48 kg/m   General: well appearing, in no acute distress CV: RRR, radial pulses equal and palpable Resp: Normal work of breathing on room air Abd: Soft, non tender, non distended  GU: (chaperoned by cma) blood coming out of the os and some pooling in the vagina, no other visible discharge, no CMT or lesions of the cervix or introitus    ASSESSMENT/PLAN:   Assessment & Plan Possible pregnancy No contraception. Patient is currently trying to get pregnant. Irregularity in menstrual cycle could be due to patient approaching menopause or stressors. Patient desires pregnancy thus counseled regarding risks of AMA pregnancy and precautions to take. - Upreg  - Switched patient from losartan-hydrochlorothiazide to 10 mg labetalol BID  - start prenatal vitamins  - Made appt with PCP in one week to discuss other  preconception counseling  Vaginal discharge Yeast could cause spotting in addition to thick discharge Low risk for STI, patient declined.  - Wet prep       Lockie Mola, MD [facility]

## 2022-11-22 ENCOUNTER — Other Ambulatory Visit: Payer: Self-pay | Admitting: Family Medicine

## 2022-11-22 DIAGNOSIS — G44209 Tension-type headache, unspecified, not intractable: Secondary | ICD-10-CM

## 2022-11-25 ENCOUNTER — Telehealth: Payer: Self-pay | Admitting: Family Medicine

## 2022-11-25 NOTE — Telephone Encounter (Signed)
Called patient and informed of negative wet prep results patient did not have any further questions.

## 2022-11-29 ENCOUNTER — Ambulatory Visit: Payer: Self-pay | Admitting: Family Medicine

## 2022-11-29 NOTE — Progress Notes (Deleted)
    SUBJECTIVE:   CHIEF COMPLAINT / HPI:   ***  PERTINENT  PMH / PSH: ***  OBJECTIVE:   LMP 11/15/2022   ***  ASSESSMENT/PLAN:   No problem-specific Assessment & Plan notes found for this encounter.     Para March, DO [facility]

## 2023-01-04 ENCOUNTER — Ambulatory Visit: Payer: MEDICAID | Admitting: Student

## 2023-01-04 NOTE — Progress Notes (Deleted)
  SUBJECTIVE:   CHIEF COMPLAINT / HPI:   BP Med's Issue   PERTINENT  PMH / PSH: ***  Past Medical History:  Diagnosis Date   DELAYED MENSES 02/11/2010   Qualifier: Diagnosis of  By: Wallene Huh  MD, Khary     Hypertension    MIGRAINE HEADACHE 12/13/2007   Urinary tract infection 03/04/2011   OBJECTIVE:  There were no vitals taken for this visit. Physical Exam   ASSESSMENT/PLAN:   Assessment & Plan  No follow-ups on file. Bess Kinds, MD 01/04/2023, 12:51 PM PGY-***, [facility] {    This will disappear when note is signed, click to select method of visit    :1}

## 2023-01-05 ENCOUNTER — Ambulatory Visit: Payer: MEDICAID | Admitting: Student

## 2023-01-05 NOTE — Progress Notes (Deleted)
  SUBJECTIVE:   CHIEF COMPLAINT / HPI:   BP Med Issue Meds: Amlodipine 10 mg, labetalol 100 mg BP Readings from Last 3 Encounters:  11/18/22 132/81  09/29/22 119/82  09/23/22 111/78     PERTINENT  PMH / PSH: ***  Past Medical History:  Diagnosis Date   DELAYED MENSES 02/11/2010   Qualifier: Diagnosis of  By: Wallene Huh  MD, Khary     Hypertension    MIGRAINE HEADACHE 12/13/2007   Urinary tract infection 03/04/2011   OBJECTIVE:  There were no vitals taken for this visit. Physical Exam   ASSESSMENT/PLAN:   Assessment & Plan  No follow-ups on file. Kendra Kinds, MD 01/05/2023, 1:00 PM PGY-***, [facility] {    This will disappear when note is signed, click to select method of visit    :1}

## 2023-01-08 ENCOUNTER — Emergency Department (HOSPITAL_COMMUNITY)
Admission: EM | Admit: 2023-01-08 | Discharge: 2023-01-08 | Disposition: A | Discharge status: Home / Self Care | Payer: MEDICAID | Attending: Emergency Medicine | Admitting: Emergency Medicine

## 2023-01-08 ENCOUNTER — Encounter (HOSPITAL_COMMUNITY): Payer: Self-pay

## 2023-01-08 ENCOUNTER — Emergency Department (HOSPITAL_COMMUNITY): Payer: MEDICAID

## 2023-01-08 ENCOUNTER — Other Ambulatory Visit: Payer: Self-pay

## 2023-01-08 DIAGNOSIS — R079 Chest pain, unspecified: Secondary | ICD-10-CM | POA: Insufficient documentation

## 2023-01-08 DIAGNOSIS — D649 Anemia, unspecified: Secondary | ICD-10-CM | POA: Insufficient documentation

## 2023-01-08 DIAGNOSIS — Z5329 Procedure and treatment not carried out because of patient's decision for other reasons: Secondary | ICD-10-CM | POA: Insufficient documentation

## 2023-01-08 LAB — COMPREHENSIVE METABOLIC PANEL
ALT: 15 U/L (ref 0–44)
AST: 14 U/L — ABNORMAL LOW (ref 15–41)
Albumin: 3.7 g/dL (ref 3.5–5.0)
Alkaline Phosphatase: 80 U/L (ref 38–126)
Anion gap: 8 (ref 5–15)
BUN: 8 mg/dL (ref 6–20)
CO2: 24 mmol/L (ref 22–32)
Calcium: 9.4 mg/dL (ref 8.9–10.3)
Chloride: 103 mmol/L (ref 98–111)
Creatinine, Ser: 0.79 mg/dL (ref 0.44–1.00)
GFR, Estimated: >60 mL/min (ref >60)
Glucose, Bld: 111 mg/dL — ABNORMAL HIGH (ref 70–99)
Potassium: 3.6 mmol/L (ref 3.5–5.1)
Sodium: 135 mmol/L (ref 135–145)
Total Bilirubin: 1 mg/dL (ref <1.2)
Total Protein: 8 g/dL (ref 6.5–8.1)

## 2023-01-08 LAB — CBC
HCT: 39.6 % (ref 36.0–46.0)
Hemoglobin: 11.9 g/dL — ABNORMAL LOW (ref 12.0–15.0)
MCH: 23.8 pg — ABNORMAL LOW (ref 26.0–34.0)
MCHC: 30.1 g/dL (ref 30.0–36.0)
MCV: 79.4 fL — ABNORMAL LOW (ref 80.0–100.0)
Platelets: 317 10*3/uL (ref 150–400)
RBC: 4.99 MIL/uL (ref 3.87–5.11)
RDW: 15.6 % — ABNORMAL HIGH (ref 11.5–15.5)
WBC: 7.2 10*3/uL (ref 4.0–10.5)
nRBC: 0 % (ref 0.0–0.2)

## 2023-01-08 LAB — TROPONIN I (HIGH SENSITIVITY): Troponin I (High Sensitivity): 3 ng/L (ref <18)

## 2023-01-08 LAB — LIPASE, BLOOD: Lipase: 27 U/L (ref 11–51)

## 2023-01-08 MED ORDER — ASPIRIN 81 MG PO CHEW
81.0000 mg | CHEWABLE_TABLET | Freq: Once | ORAL | Status: AC
  Administered 2023-01-08: 81 mg via ORAL
  Filled 2023-01-08: qty 1

## 2023-01-08 NOTE — Discharge Instructions (Signed)
You are leaving AGAINST MEDICAL ADVICE.  Please follow-up on your labs on MyChart and return if you note any abnormalities.  Please feel free to return at any time.  Please return if you are having any new or worsening symptoms

## 2023-01-08 NOTE — ED Triage Notes (Signed)
Pt arrived POV from home c/o generalized CP that is on both sides and a headache. Pt states it feels like gas and she has been burping but does not have much relief.

## 2023-01-08 NOTE — ED Provider Notes (Signed)
[place] EMERGENCY DEPARTMENT AT [facility] Provider Note   CSN: [id number] Arrival date & time: 01/08/23  7829     History  Chief Complaint  Patient presents with   Chest Pain    Kendra Harris is a 42 y.o. female.  HPI 42 year old female history of high blood pressure presents today complaining of chest pain.  She reports that she has had 3 episodes of sharp chest pain that began in the midsternal area and radiate up to the left mid clavicle.  These last about 5 minutes.  She had 2 episodes yesterday and 1 this morning.  She came to the hospital secondary to the event this morning.  She denies any previous episodes.  She denies any fever, cough, chills, dyspnea, nausea, vomiting, epigastric pain, DVT, swelling in legs, history of PE or risk factors.  She denies having been evaluated for this in the past.  She states that she has had episodes of indigestion in the past but this does not similar to those episodes of indigestion.  She took her amlodipine this morning but not her beta-blocker.     Home Medications Prior to Admission medications   Medication Sig Start Date End Date Taking? Authorizing Provider  amLODipine (NORVASC) 10 MG tablet TAKE 1 TABLET(10 MG) BY MOUTH DAILY 11/18/22   Lockie Mola, MD  famotidine (PEPCID) 20 MG tablet Take 1 tablet (20 mg total) by mouth 2 (two) times daily. 09/29/22   Domenick Gong, MD  labetalol (NORMODYNE) 100 MG tablet Take 1 tablet (100 mg total) by mouth 2 (two) times daily. 11/18/22   Lockie Mola, MD  pantoprazole (PROTONIX) 40 MG tablet Take 1 tablet (40 mg total) by mouth daily. 09/29/22   Domenick Gong, MD  Prenatal Vit-Fe Fumarate-FA (PREPLUS) 27-1 MG TABS Take 1 tablet by mouth daily. 11/18/22   Lockie Mola, MD      Allergies    Patient has no known allergies.    Review of Systems   Review of Systems  Physical Exam Updated Vital Signs BP (!) 161/99 (BP Location: Right Arm)   Pulse 88   Temp 98.4  F (36.9 C) (Oral)   Resp 20   Ht 1.575 m (5\' 2" )   Wt 104.3 kg   SpO2 100%   BMI 42.07 kg/m  Physical Exam Vitals reviewed.  Constitutional:      Appearance: She is obese.  HENT:     Head: Normocephalic.  Eyes:     Pupils: Pupils are equal, round, and reactive to light.  Cardiovascular:     Rate and Rhythm: Normal rate and regular rhythm.     Heart sounds: Normal heart sounds.  Pulmonary:     Effort: Pulmonary effort is normal.     Breath sounds: Normal breath sounds.  Abdominal:     General: Bowel sounds are normal.     Palpations: Abdomen is soft.  Musculoskeletal:        General: Normal range of motion.     Cervical back: Normal range of motion and neck supple.     Right lower leg: No tenderness. No edema.     Left lower leg: No tenderness. No edema.  Skin:    General: Skin is warm and dry.     Capillary Refill: Capillary refill takes less than 2 seconds.  Neurological:     General: No focal deficit present.     Mental Status: She is alert and oriented to person, place, and time.  Psychiatric:  Mood and Affect: Mood normal.     ED Results / Procedures / Treatments   Labs (all labs ordered are listed, but only abnormal results are displayed) Labs Reviewed  CBC - Abnormal; Notable for the following components:      Result Value   Hemoglobin 11.9 (*)    MCV 79.4 (*)    MCH 23.8 (*)    RDW 15.6 (*)    All other components within normal limits  COMPREHENSIVE METABOLIC PANEL  LIPASE, BLOOD  TROPONIN I (HIGH SENSITIVITY)    EKG EKG Interpretation Date/Time:  Saturday January 08 2023 07:52:19 EST Ventricular Rate:  87 PR Interval:  203 QRS Duration:  95 QT Interval:  365 QTC Calculation: 440 R Axis:   0  Text Interpretation: Sinus rhythm Borderline prolonged PR interval Low voltage, precordial leads HEART RATE INCREASED SINCE first prior tracing of 29 September 2022 Confirmed by Margarita Grizzle [id number]) on 01/08/2023 8:08:48 AM  Radiology DG Chest  Port 1 View  Result Date: 01/08/2023 CLINICAL DATA:  Chest pain EXAM: PORTABLE CHEST 1 VIEW COMPARISON:  02/04/2021 FINDINGS: The heart size and mediastinal contours are within normal limits. Both lungs are clear. The visualized skeletal structures are unremarkable. IMPRESSION: No active disease. Electronically Signed   By: Sharlet Salina M.D.   On: 01/08/2023 08:30    Procedures Procedures    Medications Ordered in ED Medications  aspirin chewable tablet 81 mg (81 mg Oral Given 01/08/23 0847)    ED Course/ Medical Decision Making/ A&P Clinical Course as of 01/08/23 0900  Sat Jan 08, 2023  8469 Chest x-Camaron Cammack reviewed and interpreted no evidence accountability radiologist interpretation concurs [DR]  [phone] CBC reviewed interpreted significant for mild anemia [DR]  0856 Anemia is stable from first prior [DR]    Clinical Course User Index [DR] Margarita Grizzle, MD                                 Medical Decision Making Amount and/or Complexity of Data Reviewed Labs: ordered. Radiology: ordered.  Risk OTC drugs.   Patient seen and evaluated for chest pain.  Differential diagnosis of serious/life threatening causes of chest pain includes ACS, other diseases of the heart such as myocarditis or pericarditis, lung etiologies such as infection or pneumothorax, diseases of the great vessels such as aortic dissection or AAA, pulmonary embolism, or GI sources such as cholecystitis or other upper abdominal causes. Patient reportedly needs to leave to provide child care Discussed leaving ama and risks Discussd that she can return at any time        Final Clinical Impression(s) / ED Diagnoses Final diagnoses:  Chest pain, unspecified type    Rx / DC Orders ED Discharge Orders     None         Margarita Grizzle, MD 01/08/23 0900

## 2023-01-10 ENCOUNTER — Ambulatory Visit (HOSPITAL_COMMUNITY): Payer: Self-pay

## 2023-01-12 ENCOUNTER — Other Ambulatory Visit: Payer: Self-pay | Admitting: Family Medicine

## 2023-01-12 DIAGNOSIS — I10 Essential (primary) hypertension: Secondary | ICD-10-CM

## 2023-01-14 ENCOUNTER — Ambulatory Visit (INDEPENDENT_AMBULATORY_CARE_PROVIDER_SITE_OTHER): Payer: MEDICAID | Admitting: Family Medicine

## 2023-01-14 ENCOUNTER — Encounter: Payer: Self-pay | Admitting: Family Medicine

## 2023-01-14 VITALS — BP 149/92 | HR 68 | Ht 62.0 in | Wt 218.6 lb

## 2023-01-14 DIAGNOSIS — K219 Gastro-esophageal reflux disease without esophagitis: Secondary | ICD-10-CM | POA: Diagnosis not present

## 2023-01-14 DIAGNOSIS — Z3200 Encounter for pregnancy test, result unknown: Secondary | ICD-10-CM

## 2023-01-14 DIAGNOSIS — I10 Essential (primary) hypertension: Secondary | ICD-10-CM | POA: Diagnosis not present

## 2023-01-14 DIAGNOSIS — R011 Cardiac murmur, unspecified: Secondary | ICD-10-CM | POA: Insufficient documentation

## 2023-01-14 DIAGNOSIS — Z319 Encounter for procreative management, unspecified: Secondary | ICD-10-CM | POA: Insufficient documentation

## 2023-01-14 LAB — POCT URINE PREGNANCY: Preg Test, Ur: NEGATIVE

## 2023-01-14 MED ORDER — BLOOD PRESSURE CUFF MISC: 1.0000 | Freq: Every day | 0 refills | Status: DC

## 2023-01-14 MED ORDER — AMLODIPINE BESYLATE 10 MG PO TABS
ORAL_TABLET | ORAL | 3 refills | Status: DC
Start: 2023-01-14 — End: 2023-03-20

## 2023-01-14 MED ORDER — FAMOTIDINE 20 MG PO TABS: 20.0000 mg | ORAL_TABLET | Freq: Two times a day (BID) | ORAL | 0 refills | Status: DC

## 2023-01-14 NOTE — Assessment & Plan Note (Signed)
Likely cause of chest pain given character of symptoms and negative cardiac w/u in ED and known history not currently on acid suppression. Refill famotidine. F/u 2 weeks, if no improvement plan to refill protonix at that time.

## 2023-01-14 NOTE — Assessment & Plan Note (Signed)
Uncontrolled. Out of meds, refilled today. Monitor BP at home. F/u in 2 weeks for recheck.

## 2023-01-14 NOTE — Assessment & Plan Note (Signed)
Heard on exam with previous documentation many years ago, never had ECHO to evaluate, ordered.

## 2023-01-14 NOTE — Assessment & Plan Note (Addendum)
Actively trying to conceive. Check Upreg today per patient wishes. Med list reviewed. Consider referral if not pregnant 3 months from now with consistent effort.

## 2023-01-14 NOTE — Progress Notes (Signed)
   SUBJECTIVE:   CHIEF COMPLAINT / HPI:   ER FOLLOW UP Hospital/facility: [facility] Diagnosis: chest pain - midsternal, sharp, radiate to mid clavicle. Lasts 5 mins. intermittent Procedures/tests:  - CXR negative - CBC mild anemia, stable - EKG NSR - trops, CMP, lipase negative Consultants: none New medications: none Discharge instructions:  f/u PCP Status: same - still having some pain. Hurts worse at night and with laying down. Denies SOB, nausea, vomiting. - denies known triggers - has tried tums without relief. Drinking cold water helps. - out of BP meds, needs refill  Desire for pregnancy - LMP 12/14/22. No preg tests at home, wants to check for pregnancy today. Has been trying to conceive for about 3 months.   OBJECTIVE:   BP (!) 149/92   Pulse 68   Ht 5\' 2"  (1.575 m)   Wt 218 lb 9.6 oz (99.2 kg)   SpO2 100%   BMI 39.98 kg/m   Gen: well appearing, in NAD Card: RRR, slight systolic murmur heard at LUSB Lungs: CTAB Ext: WWP, no edema   ASSESSMENT/PLAN:   Essential hypertension, benign Uncontrolled. Out of meds, refilled today. Monitor BP at home. F/u in 2 weeks for recheck.   GERD (gastroesophageal reflux disease) Likely cause of chest pain given character of symptoms and negative cardiac w/u in ED and known history not currently on acid suppression. Refill famotidine. F/u 2 weeks, if no improvement plan to refill protonix at that time.  Systolic murmur Heard on exam with previous documentation many years ago, never had ECHO to evaluate, ordered.  Desire for pregnancy Actively trying to conceive. Check Upreg today per patient wishes. Med list reviewed. Consider referral if not pregnant 3 months from now with consistent effort.     Caro Laroche, DO

## 2023-01-14 NOTE — Patient Instructions (Addendum)
It was great to see you!  Our plans for today:  - Try the pepcid for your chest pain likely related to acid reflux.  - Monitor your blood pressure at home and keep a log of your readings. Make sure to be seated for at least 5 minutes prior to testing and not in pain or worked up for the most accurate readings. Bring this log with you to follow up.   Take care and seek immediate care sooner if you develop any concerns.   Dr. Linwood Dibbles

## 2023-02-03 ENCOUNTER — Ambulatory Visit (HOSPITAL_COMMUNITY): Payer: MEDICAID | Attending: Family Medicine

## 2023-02-04 ENCOUNTER — Ambulatory Visit: Payer: MEDICAID | Admitting: Student

## 2023-02-04 VITALS — BP 140/96 | HR 64 | Temp 98.4°F | Ht 62.0 in | Wt 218.6 lb

## 2023-02-04 DIAGNOSIS — I10 Essential (primary) hypertension: Secondary | ICD-10-CM | POA: Diagnosis not present

## 2023-02-04 DIAGNOSIS — G44209 Tension-type headache, unspecified, not intractable: Secondary | ICD-10-CM | POA: Diagnosis not present

## 2023-02-04 MED ORDER — HYDROCHLOROTHIAZIDE 12.5 MG PO TABS: 12.5000 mg | ORAL_TABLET | Freq: Every day | ORAL | 0 refills | Status: DC

## 2023-02-04 NOTE — Patient Instructions (Addendum)
It was great to see you today! Thank you for choosing [facility] for your primary care.  Today we addressed: This is potentially tension headache and related to hypertension.  I am starting you on HCTZ 12.5 mg daily.  You will need blood work in 1 week. For your headaches, you may take Tylenol 1000 mg 3 times daily, ibuprofen 600 mg every 8 hours.  I do not recommend you take ibuprofen this frequently as you can get rebound headaches if taking too often however in the short-term this can be used for tension headaches.  If you haven't already, sign up for My Chart to have easy access to your labs results, and communication with your primary care physician.  Return in about 1 month (around 03/07/2023) for tension headache and HTN f/u. Please arrive 15 minutes before your appointment to ensure smooth check in process.  We appreciate your efforts in making this happen.  Thank you for allowing me to participate in your care, Shelby Mattocks, DO 02/04/2023, 10:28 AM PGY-3, [facility]

## 2023-02-04 NOTE — Assessment & Plan Note (Signed)
BP: (!) 140/96 today. Poorly controlled. Goal of <130/80. Continue to work on healthy dietary habits and exercise.  Discontinue labetalol. Medication regimen: Continue amlodipine 10 mg daily, start HCTZ 12.5 mg daily.  Check BMP in 1 week.  Return in 1 month for follow-up.

## 2023-02-04 NOTE — Assessment & Plan Note (Signed)
Suspect related to hypertension and an effective OTC pain control.  Discussed appropriate dosages of Tylenol and ibuprofen for conservative management in addition to approaching hypertension.

## 2023-02-04 NOTE — Progress Notes (Cosign Needed)
  SUBJECTIVE:   CHIEF COMPLAINT / HPI:   Headache?:  Tightness in the back of her head, left eyebrow, and center of her chest. This has been present intermittently for the last 4 days. She does get tension headaches and thought this was perhaps one of those but this is lasting more than usual. Her head is like a throbbing pain. She has tried advil, tylenol, ibuprofen and none of them are helping. Her shoulders are very tight and she has had shoulder/neck muscle spasms in the past. Denies recent trauma.   Hypertension: Home medications include: amlodipine 10mg  daily, labetalol 100mg  BID. She does not endorse taking these medications as prescribed; she has stopped taking the labetalol 3 days ago. Does not check blood pressure at home. Patient has had a BMP in the past 1 year.  PERTINENT  PMH / PSH: HTN, GERD  OBJECTIVE:  BP (!) 140/96   Pulse 64   Temp 98.4 F (36.9 C)   Ht 5\' 2"  (1.575 m)   Wt 218 lb 9.6 oz (99.2 kg)   SpO2 100%   BMI 39.98 kg/m  General: Well-appearing, NAD HEENT/MSK: No appreciable hypertonicity of neck or trapezius bilaterally, tenderness to occipitalis muscles briefly, no appreciable strabismus CV: RRR, no murmurs auscultated Pulm: Normal WOB  ASSESSMENT/PLAN:   Assessment & Plan Essential hypertension, benign BP: (!) 140/96 today. Poorly controlled. Goal of <130/80. Continue to work on healthy dietary habits and exercise.  Discontinue labetalol. Medication regimen: Continue amlodipine 10 mg daily, start HCTZ 12.5 mg daily.  Check BMP in 1 week.  Return in 1 month for follow-up. Tension headache Suspect related to hypertension and an effective OTC pain control.  Discussed appropriate dosages of Tylenol and ibuprofen for conservative management in addition to approaching hypertension. Return in about 1 month (around 03/07/2023) for tension headache and HTN f/u. Shelby Mattocks, DO 02/04/2023, 7:47 PM PGY-3, [facility]

## 2023-02-07 ENCOUNTER — Telehealth: Payer: Self-pay

## 2023-02-07 ENCOUNTER — Ambulatory Visit (INDEPENDENT_AMBULATORY_CARE_PROVIDER_SITE_OTHER): Payer: MEDICAID | Admitting: Student

## 2023-02-07 ENCOUNTER — Other Ambulatory Visit: Payer: Self-pay | Admitting: Family Medicine

## 2023-02-07 VITALS — BP 132/81 | HR 69 | Ht 62.0 in | Wt 216.8 lb

## 2023-02-07 DIAGNOSIS — I10 Essential (primary) hypertension: Secondary | ICD-10-CM | POA: Diagnosis not present

## 2023-02-07 DIAGNOSIS — G44209 Tension-type headache, unspecified, not intractable: Secondary | ICD-10-CM

## 2023-02-07 MED ORDER — HYDROCHLOROTHIAZIDE 12.5 MG PO TABS
12.5000 mg | ORAL_TABLET | Freq: Every day | ORAL | 0 refills | Status: DC
Start: 2023-02-07 — End: 2023-03-09

## 2023-02-07 MED ORDER — KETOROLAC TROMETHAMINE 30 MG/ML IJ SOLN
30.0000 mg | Freq: Once | INTRAMUSCULAR | Status: DC
Start: 2023-02-07 — End: 2023-02-07

## 2023-02-07 MED ORDER — CYCLOBENZAPRINE HCL 5 MG PO TABS: 5.0000 mg | ORAL_TABLET | Freq: Three times a day (TID) | ORAL | 1 refills | Status: DC | PRN

## 2023-02-07 MED ORDER — HYDROCHLOROTHIAZIDE 12.5 MG PO TABS
12.5000 mg | ORAL_TABLET | Freq: Every day | ORAL | 0 refills | Status: DC
Start: 2023-02-07 — End: 2023-02-07

## 2023-02-07 NOTE — Assessment & Plan Note (Signed)
Continue with current medications. Patient report HCTZ might not be covered by pharmacy.  Will order and have her call if unable to pick up. BMP ordered today

## 2023-02-07 NOTE — Telephone Encounter (Signed)
Patient calls nurse line regarding issues with picking up hydrochlorothiazide that was prescribed on 02/04/23.   Dr. Royal Piedra is not currently enrolled in Medicaid, therefore, patient is unable to pick up through her insurance.   Forwarding to precepting physician to resend.   Veronda Prude, RN

## 2023-02-07 NOTE — Progress Notes (Cosign Needed)
    SUBJECTIVE:   CHIEF COMPLAINT / HPI:   Headache Stabbing Pain:  -Patient seen previously for headaches on 02/04/2023 that began a few days prior to that visit. - Denied recent trauma at that time and reported tightness in the back of her head, left eyebrow and center of her chest. - Has a history of tension headaches per last note but reported most recent head pain as a throbbing pain. - Had tried Advil, Tylenol, ibuprofen at that time with minimal relief. - At that visit, suspected tension headache with HTN playing a role  -- Cannot go to work  -- Has history of migraines but feels different and comes and goes  -- not sensitive to light  -- Had a cold a while back but not currently  -- Denies nausea or vomiting  -- No changes to eyesight  -- Was on propranolol for migraines in the past  -- Patient does report that she has some tension in the neck that feels like it may be causing worsening symptoms in the back of the head.  PERTINENT  PMH / PSH:  HTN - Amlodipine and hydrochlorothiazide  GERD  Heart murmur   OBJECTIVE:   BP 132/81   Pulse 69   Ht 5\' 2"  (1.575 m)   Wt 216 lb 12.8 oz (98.3 kg)   LMP 01/01/2023   SpO2 100%   BMI 39.65 kg/m   General: Alert and oriented in no apparent distress Heart: Regular rate and rhythm with murmur appreciated Lungs: CTA bilaterally, no wheezing Abdomen: no abdominal pain Skin: Warm and dry Neuro: CN III, IV,VI: EOMI CV V: Normal sensation in V1, V2, V3 CVII: Symmetric smile and brow raise CN VIII: Normal hearing CN XI: 5/5 shoulder shrug CN XII: Symmetric tongue protrusion  UE and LE strength 5/5 Normal sensation in UE and LE bilaterally  Normal gait     ASSESSMENT/PLAN:   Assessment & Plan Tension headache Differential diagnosis considered medication overuse headache, tension-type headache, and migraine. Physical exam and clinical history most suggestive of tension-type headache with considerable potential for  medication overuse headache and migraine secondarily given pertinent positives of medication use and presentation. Opted to obtain no labs/imaging given that patient has a normal neurologic exam. Will treat neck tension with muscle relaxer. Discussed sparing use of OTC analgesia given risk of refractory headaches. Strict return precautions if persists.  Essential hypertension, benign Continue with current medications. Patient report HCTZ might not be covered by pharmacy.  Will order and have her call if unable to pick up. BMP ordered today      Alfredo Martinez, MD [facility]

## 2023-02-07 NOTE — Patient Instructions (Signed)
It was great to see you today! Thank you for choosing [facility] for your primary care.  Today we addressed: Try flexeril 5 mg as needed for pain control up to three times a day This can make you sleepy Try to use Tylenol sparingly  Continue current BP medications  We will continue with a toradol shot   If you haven't already, sign up for My Chart to have easy access to your labs results, and communication with your primary care physician.   Please arrive 15 minutes before your appointment to ensure smooth check in process.  We appreciate your efforts in making this happen.  Thank you for allowing me to participate in your care, Alfredo Martinez, MD 02/07/2023, 4:21 PM PGY-3, [facility]

## 2023-02-07 NOTE — Assessment & Plan Note (Signed)
Differential diagnosis considered medication overuse headache, tension-type headache, and migraine. Physical exam and clinical history most suggestive of tension-type headache with considerable potential for medication overuse headache and migraine secondarily given pertinent positives of medication use and presentation. Opted to obtain no labs/imaging given that patient has a normal neurologic exam. Will treat neck tension with muscle relaxer. Discussed sparing use of OTC analgesia given risk of refractory headaches. Strict return precautions if persists.

## 2023-02-08 LAB — BASIC METABOLIC PANEL
BUN/Creatinine Ratio: 14 (ref 9–23)
BUN: 13 mg/dL (ref 6–24)
CO2: 22 mmol/L (ref 20–29)
Calcium: 9.7 mg/dL (ref 8.7–10.2)
Chloride: 100 mmol/L (ref 96–106)
Creatinine, Ser: 0.91 mg/dL (ref 0.57–1.00)
Glucose: 108 mg/dL — ABNORMAL HIGH (ref 70–99)
Potassium: 3.5 mmol/L (ref 3.5–5.2)
Sodium: 139 mmol/L (ref 134–144)
eGFR: 80 mL/min/{1.73_m2} (ref >59)

## 2023-02-14 ENCOUNTER — Encounter: Payer: Self-pay | Admitting: Student

## 2023-02-15 ENCOUNTER — Ambulatory Visit: Payer: MEDICAID | Admitting: Student

## 2023-02-15 NOTE — Progress Notes (Deleted)
    SUBJECTIVE:   CHIEF COMPLAINT / HPI:   ***  PERTINENT  PMH / PSH: MDD, back pain   OBJECTIVE:   LMP 01/01/2023   ***  ASSESSMENT/PLAN:   No problem-specific Assessment & Plan notes found for this encounter.     Wendel Lesch, MD [facility]

## 2023-02-17 ENCOUNTER — Ambulatory Visit: Payer: MEDICAID | Admitting: Student

## 2023-02-18 ENCOUNTER — Other Ambulatory Visit: Payer: Self-pay

## 2023-02-18 ENCOUNTER — Emergency Department (HOSPITAL_COMMUNITY)
Admission: EM | Admit: 2023-02-18 | Discharge: 2023-02-18 | Disposition: A | Discharge status: Home / Self Care | Payer: MEDICAID | Attending: Emergency Medicine | Admitting: Emergency Medicine

## 2023-02-18 ENCOUNTER — Emergency Department (HOSPITAL_COMMUNITY): Payer: MEDICAID

## 2023-02-18 ENCOUNTER — Encounter (HOSPITAL_COMMUNITY): Payer: Self-pay

## 2023-02-18 DIAGNOSIS — I10 Essential (primary) hypertension: Secondary | ICD-10-CM | POA: Insufficient documentation

## 2023-02-18 DIAGNOSIS — R519 Headache, unspecified: Secondary | ICD-10-CM | POA: Insufficient documentation

## 2023-02-18 DIAGNOSIS — Z79899 Other long term (current) drug therapy: Secondary | ICD-10-CM | POA: Insufficient documentation

## 2023-02-18 MED ORDER — SODIUM CHLORIDE 0.9 % IV BOLUS
500.0000 mL | Freq: Once | INTRAVENOUS | Status: AC
  Administered 2023-02-18: 500 mL via INTRAVENOUS

## 2023-02-18 MED ORDER — METOCLOPRAMIDE HCL 5 MG/ML IJ SOLN
10.0000 mg | INTRAMUSCULAR | Status: AC
  Administered 2023-02-18: 10 mg via INTRAVENOUS
  Filled 2023-02-18: qty 2

## 2023-02-18 MED ORDER — DIPHENHYDRAMINE HCL 50 MG/ML IJ SOLN
12.5000 mg | Freq: Once | INTRAMUSCULAR | Status: AC
  Administered 2023-02-18: 12.5 mg via INTRAVENOUS
  Filled 2023-02-18: qty 1

## 2023-02-18 NOTE — Discharge Instructions (Signed)
 Your evaluation in the ED was reassuring.  Continue use of over-the-counter remedies such as ibuprofen or Aleve for management of headaches.  Follow-up with your primary care doctor.

## 2023-02-18 NOTE — ED Triage Notes (Addendum)
 Arrives [facility] from home with intermittent headaches for the last week intensifying over the last 24 hours. Pain usually present with HTN.   No neuro deficits.   Compliant on hydrochlorothiazide, and amlodipine.

## 2023-02-18 NOTE — ED Provider Notes (Signed)
 [place] EMERGENCY DEPARTMENT AT [facility] Provider Note   CSN: [id number] Arrival date & time: 02/18/23  9775     History  Chief Complaint  Patient presents with   Headache    Kendra Harris is a 43 y.o. female.  43 year old female with a history of hypertension, migraine headaches presents to the emergency department for persistent left-sided headache.  She states that symptoms have been recurrent over the past month.  She describes a tightening in her left parietal scalp which has been unrelieved with over-the-counter analgesics such as Tylenol  and Excedrin.  States that symptoms feel different to past migraine headaches.  She has been seen at the [facility] x2 for these complaints.  Reports compliance with her antihypertensive medications.  No fevers, vision changes or loss, photophobia, phonophobia, extremity numbness or paresthesias, extremity weakness.  The history is provided by the patient. No language interpreter was used.  Headache      Home Medications Prior to Admission medications   Medication Sig Start Date End Date Taking? Authorizing Provider  amLODipine  (NORVASC ) 10 MG tablet TAKE 1 TABLET(10 MG) BY MOUTH DAILY 01/14/23   Rumball, Alison M, DO  Blood Pressure Monitoring (BLOOD PRESSURE CUFF) MISC 1 each by Does not apply route daily. 01/14/23   Madelon Donald HERO, DO  cyclobenzaprine  (FLEXERIL ) 5 MG tablet Take 1 tablet (5 mg total) by mouth 3 (three) times daily as needed for muscle spasms. 02/07/23   Bryan Bianchi, MD  famotidine  (PEPCID ) 20 MG tablet Take 1 tablet (20 mg total) by mouth 2 (two) times daily. 01/14/23   Madelon Donald HERO, DO  hydrochlorothiazide  (HYDRODIURIL ) 12.5 MG tablet Take 1 tablet (12.5 mg total) by mouth daily. 02/07/23   Bryan Bianchi, MD  Prenatal Vit-Fe Fumarate-FA (PREPLUS) 27-1 MG TABS Take 1 tablet by mouth daily. 11/18/22   Nicholas Bar, MD      Allergies    Patient has no known allergies.    Review of Systems    Review of Systems  Neurological:  Positive for headaches.  Ten systems reviewed and are negative for acute change, except as noted in the HPI.    Physical Exam Updated Vital Signs BP (!) 142/85   Pulse 91   Temp 98.5 F (36.9 C) (Oral)   Resp 16   Ht 5' 2 (1.575 m)   Wt 98 kg   LMP 02/18/2023 (Exact Date)   SpO2 100%   BMI 39.51 kg/m   Physical Exam Vitals and nursing note reviewed.  Constitutional:      General: She is not in acute distress.    Appearance: She is well-developed. She is not diaphoretic.     Comments: Nontoxic appearing and in NAD  HENT:     Head: Normocephalic and atraumatic.     Right Ear: External ear normal.     Left Ear: External ear normal.  Eyes:     General: No scleral icterus.    Extraocular Movements: Extraocular movements intact.     Conjunctiva/sclera: Conjunctivae normal.  Neck:     Comments: No nuchal rigidity or meningismus Cardiovascular:     Rate and Rhythm: Normal rate and regular rhythm.     Pulses: Normal pulses.  Pulmonary:     Effort: Pulmonary effort is normal. No respiratory distress.     Breath sounds: No stridor. No wheezing.     Comments: Respirations even and unlabored Musculoskeletal:        General: Normal range of motion.  Cervical back: Normal range of motion.  Skin:    General: Skin is warm and dry.     Coloration: Skin is not pale.     Findings: No erythema or rash.  Neurological:     Mental Status: She is alert and oriented to person, place, and time.     Coordination: Coordination normal.     Comments: GCS 15. Speech is goal oriented. No deficits appreciated to CN III-XII; symmetric eyebrow raise, no facial drooping, tongue midline. No focal deficits appreciated on exam. Patient moves extremities without ataxia; symmetric.  Psychiatric:        Behavior: Behavior normal.     ED Results / Procedures / Treatments   Labs (all labs ordered are listed, but only abnormal results are displayed) Labs  Reviewed - No data to display  EKG None  Radiology CT HEAD WO CONTRAST ( ) Result Date: 02/18/2023 CLINICAL DATA:  Headache EXAM: CT HEAD WITHOUT CONTRAST TECHNIQUE: Contiguous axial images were obtained from the base of the skull through the vertex without intravenous contrast. RADIATION DOSE REDUCTION: This exam was performed according to the departmental dose-optimization program which includes automated exposure control, adjustment of the mA and/or kV according to patient size and/or use of iterative reconstruction technique. COMPARISON:  None Available. FINDINGS: Brain: No acute intracranial abnormality. Specifically, no hemorrhage, hydrocephalus, mass lesion, acute infarction, or significant intracranial injury. Vascular: No hyperdense vessel or unexpected calcification. Skull: No acute calvarial abnormality. Sinuses/Orbits: No acute findings Other: None IMPRESSION: No acute intracranial abnormality. Electronically Signed   By: Franky Crease M.D.   On: 02/18/2023 03:17    Procedures Procedures    Medications Ordered in ED Medications  metoCLOPramide  (REGLAN ) injection 10 mg (10 mg Intravenous Given 02/18/23 0303)  diphenhydrAMINE  (BENADRYL ) injection 12.5 mg (12.5 mg Intravenous Given 02/18/23 0302)  sodium chloride  0.9 % bolus 500 mL (0 mLs Intravenous Stopped 02/18/23 0350)    ED Course/ Medical Decision Making/ A&P Clinical Course as of 02/18/23 0451  Fri Feb 18, 2023  0334 Head CT negative for CVA, ICH, mass/lesion. Given symptom chronicity x 1 month and reassuring neurologic exam, doubt emergent intracranial process. No vision changes to suggest progressing IIH. BP not high enough to warrant PRES; will trend, but felt elevated 2/2 pain response. Plan for reassessment momentarily as medications administered 30 minutes ago. [KH]  0347 Headache improved to 2/10. Patient requesting discharge. States she is feeling better. [KH]    Clinical Course User Index [KH] Keith Sor, PA-C                                  Medical Decision Making Amount and/or Complexity of Data Reviewed Radiology: ordered.  Risk Prescription drug management.   This patient presents to the ED for concern of headache, this involves an extensive number of treatment options, and is a complaint that carries with it a high risk of complications and morbidity.  The differential diagnosis includes migraine vs tension headache vs temporal arteritis vs IIH vs ICH vs CVA vs meningitis vs PRES   Co morbidities that complicate the patient evaluation  HTN Migraines   Additional history obtained:  External records from outside source obtained and reviewed including PCP visit for same on 02/07/23   Imaging Studies ordered:  I ordered imaging studies including CT head  I independently visualized and interpreted imaging which showed no acute intracranial abnormality I agree with the radiologist interpretation  Cardiac Monitoring:  The patient was maintained on a cardiac monitor.  I personally viewed and interpreted the cardiac monitored which showed an underlying rhythm of: NSR   Medicines ordered and prescription drug management:  I ordered medication including Reglan  and Benadryl  for headache  Reevaluation of the patient after these medicines showed that the patient improved I have reviewed the patients home medicines and have made adjustments as needed   Test Considered:  ESR - however, doubt temporal arteritis given chronicity with lack of vision changes.   Problem List / ED Course:  As above Patient with no history of recent head injury or trauma.  No fever, nuchal rigidity, meningismus to suggest meningitis.  Neurologic exam today is nonfocal.   On reassessment, the patient has had significant improvement in headache symptoms following a migraine cocktail.  I do not believe further emergent workup is indicated at this time.     Reevaluation:  After the interventions noted  above, I reevaluated the patient and found that they have :improved   Social Determinants of Health:  Established with PCP   Dispostion:  After consideration of the diagnostic results and the patients response to treatment, I feel that the patent would benefit from use of OTC analgesics pending reassessment by PCP. Return precautions discussed and provided. Patient discharged in stable condition with no unaddressed concerns.          Final Clinical Impression(s) / ED Diagnoses Final diagnoses:  Left-sided headache    Rx / DC Orders ED Discharge Orders     None         Keith Sor, PA-C 02/18/23 0454    Haze Lonni PARAS, MD 02/18/23 [id number]

## 2023-03-04 ENCOUNTER — Ambulatory Visit: Payer: MEDICAID

## 2023-03-04 NOTE — Progress Notes (Deleted)
    SUBJECTIVE:   CHIEF COMPLAINT / HPI: UTI  ***  PERTINENT  PMH / PSH: HTN, GERD, MDD, Anemia  OBJECTIVE:   LMP 02/18/2023 (Exact Date)   ***  ASSESSMENT/PLAN:   Assessment & Plan  No follow-ups on file.  Celine Mans, MD [facility]

## 2023-03-05 ENCOUNTER — Ambulatory Visit (HOSPITAL_COMMUNITY)
Admission: RE | Admit: 2023-03-05 | Discharge: 2023-03-05 | Discharge status: Absent without leave | Payer: MEDICAID | Source: Ambulatory Visit

## 2023-03-05 ENCOUNTER — Encounter (HOSPITAL_COMMUNITY): Payer: Self-pay

## 2023-03-05 ENCOUNTER — Ambulatory Visit (HOSPITAL_COMMUNITY)
Admission: EM | Admit: 2023-03-05 | Discharge: 2023-03-05 | Disposition: A | Discharge status: Home / Self Care | Payer: MEDICAID | Attending: Emergency Medicine | Admitting: Emergency Medicine

## 2023-03-05 DIAGNOSIS — R300 Dysuria: Secondary | ICD-10-CM

## 2023-03-05 LAB — POCT URINALYSIS DIP (MANUAL ENTRY)
Bilirubin, UA: NEGATIVE
Blood, UA: NEGATIVE
Glucose, UA: NEGATIVE mg/dL
Ketones, POC UA: NEGATIVE mg/dL
Leukocytes, UA: NEGATIVE
Nitrite, UA: NEGATIVE
Protein Ur, POC: NEGATIVE mg/dL
Spec Grav, UA: 1.02 (ref 1.010–1.025)
Urobilinogen, UA: 0.2 U/dL
pH, UA: 6 (ref 5.0–8.0)

## 2023-03-05 NOTE — ED Provider Notes (Signed)
[facility]    CSN: [id number] Arrival date & time: 03/05/23  1005      History   Chief Complaint Chief Complaint  Patient presents with   Urinary Frequency   Dysuria    HPI Kendra Harris is a 43 y.o. female.  4 day history of intermittent dysuria, frequency Not having abd or flank pain. No fever Trying hydration and cranberry juice. Also reports has been drinking more soda than usual  Denies vaginal symptoms, no concern for STD LMP 1/10  Hx HTN. No BP meds taken yet today  Past Medical History:  Diagnosis Date   DELAYED MENSES 02/11/2010   Qualifier: Diagnosis of  By: Wallene Huh  MD, Khary     Hypertension    MIGRAINE HEADACHE 12/13/2007   Urinary tract infection 03/04/2011    Patient Active Problem List   Diagnosis Date Noted   Systolic murmur 01/14/2023   Desire for pregnancy 01/14/2023   Epidermoid cyst of skin of chest 04/29/2022   Trapezius muscle strain 02/07/2022   GERD (gastroesophageal reflux disease) 05/07/2021   Tension headache 05/07/2021   Anemia 04/11/2019   Major depressive disorder, recurrent episode, severe, with psychotic behavior (HCC) 08/14/2018   Menorrhagia 04/27/2013   Severe major depression without psychotic features (HCC) 07/24/2010   Back pain 02/11/2010   Essential hypertension, benign 08/31/2007   Morbid obesity with BMI of 45.0-49.9, adult (HCC) 04/07/2006    Past Surgical History:  Procedure Laterality Date   NO PAST SURGERIES      OB History     Gravida  5   Para  5   Term  5   Preterm  0   AB  0   Living  5      SAB  0   IAB  0   Ectopic  0   Multiple  0   Live Births  5            Home Medications    Prior to Admission medications   Medication Sig Start Date End Date Taking? Authorizing Provider  amLODipine (NORVASC) 10 MG tablet TAKE 1 TABLET(10 MG) BY MOUTH DAILY 01/14/23   Caro Laroche, DO  Blood Pressure Monitoring (BLOOD PRESSURE CUFF) MISC 1 each by Does not apply  route daily. 01/14/23   Caro Laroche, DO  famotidine (PEPCID) 20 MG tablet Take 1 tablet (20 mg total) by mouth 2 (two) times daily. 01/14/23   Caro Laroche, DO  hydrochlorothiazide (HYDRODIURIL) 12.5 MG tablet Take 1 tablet (12.5 mg total) by mouth daily. 02/07/23   Alfredo Martinez, MD    Family History Family History  Problem Relation Age of Onset   Healthy Mother    Sickle cell trait Sister     Social History Social History   Tobacco Use   Smoking status: Never    Passive exposure: Never   Smokeless tobacco: Never  Vaping Use   Vaping status: Never Used  Substance Use Topics   Alcohol use: No   Drug use: No     Allergies   Patient has no known allergies.   Review of Systems Review of Systems  Genitourinary:  Positive for dysuria and frequency.     Physical Exam Triage Vital Signs ED Triage Vitals [03/05/23 1022]  Encounter Vitals Group     BP (!) 164/106     Systolic BP Percentile      Diastolic BP Percentile      Pulse Rate 73  Resp 16     Temp 98.1 F (36.7 C)     Temp Source Oral     SpO2 97 %     Weight      Height      Head Circumference      Peak Flow      Pain Score 4     Pain Loc      Pain Education      Exclude from Growth Chart    No data found.  Updated Vital Signs BP (!) 168/111 (BP Location: Right Arm)   Pulse 73   Temp 98.1 F (36.7 C) (Oral)   Resp 16   LMP 02/18/2023 (Exact Date)   SpO2 97%   Physical Exam Vitals and nursing note reviewed.  Constitutional:      General: She is not in acute distress. HENT:     Mouth/Throat:     Mouth: Mucous membranes are moist.     Pharynx: Oropharynx is clear.  Eyes:     Conjunctiva/sclera: Conjunctivae normal.     Pupils: Pupils are equal, round, and reactive to light.  Cardiovascular:     Rate and Rhythm: Normal rate and regular rhythm.     Heart sounds: Normal heart sounds.  Pulmonary:     Effort: Pulmonary effort is normal.     Breath sounds: Normal breath sounds.   Abdominal:     Palpations: Abdomen is soft.     Tenderness: There is no abdominal tenderness. There is no right CVA tenderness or left CVA tenderness.  Neurological:     General: No focal deficit present.     Mental Status: She is alert and oriented to person, place, and time.      UC Treatments / Results  Labs (all labs ordered are listed, but only abnormal results are displayed) Labs Reviewed  POCT URINALYSIS DIP (MANUAL ENTRY)    EKG  Radiology No results found.  Procedures Procedures (including critical care time)  Medications Ordered in UC Medications - No data to display  Initial Impression / Assessment and Plan / UC Course  I have reviewed the triage vital signs and the nursing notes.  Pertinent labs & imaging results that were available during my care of the patient were reviewed by me and considered in my medical decision making (see chart for details).  UA is unremarkable. No sign of infection Discussed with patient Advised monitoring caffeine and sugar intake; increase water. Can return if symptoms progress  BP elevated, patient has BP med refill to pick up today and will continue daily   Final Clinical Impressions(s) / UC Diagnoses   Final diagnoses:  Dysuria     Discharge Instructions      Decrease sugar and caffeine intake Instead try only water Continue proper hygiene (wipe front to back) Please return if needed!  Take blood pressure medication when you get home      ED Prescriptions   None    PDMP not reviewed this encounter.   Marlow Baars, [place] 03/05/23 1114

## 2023-03-05 NOTE — Discharge Instructions (Addendum)
Decrease sugar and caffeine intake Instead try only water Continue proper hygiene (wipe front to back) Please return if needed!  Take blood pressure medication when you get home

## 2023-03-05 NOTE — ED Triage Notes (Signed)
Patient c/o urinary frequency and dysuria.  Patient states she has been drinking water and cranberry juice.

## 2023-03-08 ENCOUNTER — Emergency Department (HOSPITAL_COMMUNITY)
Admission: EM | Admit: 2023-03-08 | Discharge: 2023-03-08 | Disposition: A | Discharge status: Home / Self Care | Payer: MEDICAID | Attending: Emergency Medicine | Admitting: Emergency Medicine

## 2023-03-08 DIAGNOSIS — R002 Palpitations: Secondary | ICD-10-CM | POA: Diagnosis not present

## 2023-03-08 DIAGNOSIS — I10 Essential (primary) hypertension: Secondary | ICD-10-CM | POA: Insufficient documentation

## 2023-03-08 DIAGNOSIS — Z79899 Other long term (current) drug therapy: Secondary | ICD-10-CM | POA: Diagnosis not present

## 2023-03-08 LAB — CBC
HCT: 40 % (ref 36.0–46.0)
Hemoglobin: 12.2 g/dL (ref 12.0–15.0)
MCH: 24.5 pg — ABNORMAL LOW (ref 26.0–34.0)
MCHC: 30.5 g/dL (ref 30.0–36.0)
MCV: 80.3 fL (ref 80.0–100.0)
Platelets: 319 10*3/uL (ref 150–400)
RBC: 4.98 MIL/uL (ref 3.87–5.11)
RDW: 14.6 % (ref 11.5–15.5)
WBC: 7.6 10*3/uL (ref 4.0–10.5)
nRBC: 0 % (ref 0.0–0.2)

## 2023-03-08 LAB — BASIC METABOLIC PANEL
Anion gap: 12 (ref 5–15)
BUN: 14 mg/dL (ref 6–20)
CO2: 24 mmol/L (ref 22–32)
Calcium: 9 mg/dL (ref 8.9–10.3)
Chloride: 101 mmol/L (ref 98–111)
Creatinine, Ser: 0.91 mg/dL (ref 0.44–1.00)
GFR, Estimated: >60 mL/min (ref >60)
Glucose, Bld: 127 mg/dL — ABNORMAL HIGH (ref 70–99)
Potassium: 3.1 mmol/L — ABNORMAL LOW (ref 3.5–5.1)
Sodium: 137 mmol/L (ref 135–145)

## 2023-03-08 LAB — HCG, SERUM, QUALITATIVE: Preg, Serum: NEGATIVE

## 2023-03-08 LAB — TROPONIN I (HIGH SENSITIVITY): Troponin I (High Sensitivity): 3 ng/L (ref <18)

## 2023-03-08 MED ORDER — ACETAMINOPHEN 325 MG PO TABS
650.0000 mg | ORAL_TABLET | Freq: Once | ORAL | Status: AC
  Administered 2023-03-08: 650 mg via ORAL
  Filled 2023-03-08: qty 2

## 2023-03-08 NOTE — ED Provider Notes (Signed)
[place] EMERGENCY DEPARTMENT AT [facility] Provider Note   CSN: [id number] Arrival date & time: 03/08/23  0501     History  Chief Complaint  Patient presents with   Hypertension    Kendra Harris is a 43 y.o. female.  She was brought in by ambulance after being awoken with feeling her heart racing.  Blood pressure was noted to be high.  She denies any headache chest pain shortness of breath diaphoresis nausea vomiting.  She thinks she might of had a panic attack.  She denies prior history of same.  She feels back to baseline now and states she needs to go home to be there for her children.  She has a history of hypertension states she has been taking her medication.   The history is provided by the patient.  Palpitations Palpitations quality:  Fast Onset quality:  Sudden Progression:  Resolved Chronicity:  New Relieved by:  None tried Associated symptoms: no chest pain, no cough, no diaphoresis, no dizziness, no nausea, no shortness of breath and no vomiting        Home Medications Prior to Admission medications   Medication Sig Start Date End Date Taking? Authorizing Provider  amLODipine (NORVASC) 10 MG tablet TAKE 1 TABLET(10 MG) BY MOUTH DAILY 01/14/23   Caro Laroche, DO  Blood Pressure Monitoring (BLOOD PRESSURE CUFF) MISC 1 each by Does not apply route daily. 01/14/23   Caro Laroche, DO  famotidine (PEPCID) 20 MG tablet Take 1 tablet (20 mg total) by mouth 2 (two) times daily. 01/14/23   Caro Laroche, DO  hydrochlorothiazide (HYDRODIURIL) 12.5 MG tablet Take 1 tablet (12.5 mg total) by mouth daily. 02/07/23   Alfredo Martinez, MD      Allergies    Patient has no known allergies.    Review of Systems   Review of Systems  Constitutional:  Negative for diaphoresis.  Respiratory:  Negative for cough and shortness of breath.   Cardiovascular:  Positive for palpitations. Negative for chest pain.  Gastrointestinal:  Negative for nausea and  vomiting.  Neurological:  Negative for dizziness.    Physical Exam Updated Vital Signs BP (!) 146/96 (BP Location: Left Arm)   Pulse 70   Temp 98.5 F (36.9 C) (Oral)   Resp 16   LMP 02/18/2023 (Exact Date)   SpO2 100%  Physical Exam Vitals and nursing note reviewed.  Constitutional:      General: She is not in acute distress.    Appearance: Normal appearance. She is well-developed.  HENT:     Head: Normocephalic and atraumatic.  Eyes:     Conjunctiva/sclera: Conjunctivae normal.  Cardiovascular:     Rate and Rhythm: Normal rate and regular rhythm.     Heart sounds: No murmur heard. Pulmonary:     Effort: Pulmonary effort is normal. No respiratory distress.     Breath sounds: Normal breath sounds.  Abdominal:     Palpations: Abdomen is soft.     Tenderness: There is no abdominal tenderness. There is no guarding or rebound.  Musculoskeletal:        General: No deformity.     Cervical back: Neck supple.  Skin:    General: Skin is warm and dry.     Capillary Refill: Capillary refill takes less than 2 seconds.  Neurological:     General: No focal deficit present.     Mental Status: She is alert.     ED Results / Procedures / Treatments  Labs (all labs ordered are listed, but only abnormal results are displayed) Labs Reviewed  BASIC METABOLIC PANEL - Abnormal; Notable for the following components:      Result Value   Potassium 3.1 (*)    Glucose, Bld 127 (*)    All other components within normal limits  CBC - Abnormal; Notable for the following components:   MCH 24.5 (*)    All other components within normal limits  HCG, SERUM, QUALITATIVE  TROPONIN I (HIGH SENSITIVITY)    EKG EKG Interpretation Date/Time:  Tuesday March 08 2023 05:18:40 EST Ventricular Rate:  76 PR Interval:  191 QRS Duration:  97 QT Interval:  374 QTC Calculation: 421 R Axis:   1  Text Interpretation: Sinus rhythm No significant change was found Confirmed by Drema Pry [phone])  on 03/08/2023 5:26:10 AM  Radiology No results found.  Procedures Procedures    Medications Ordered in ED Medications  acetaminophen (TYLENOL) tablet 650 mg (650 mg Oral Given 03/08/23 0810)    ED Course/ Medical Decision Making/ A&P                                 Medical Decision Making Amount and/or Complexity of Data Reviewed Labs: ordered.  Risk OTC drugs.   This patient complains of palpitations fast heart rate, hypertension; this involves an extensive number of treatment Options and is a complaint that carries with it a high risk of complications and morbidity. The differential includes hypertension, ACS, pneumonia, pneumothorax, metabolic derangement, anemia, thyroid  I ordered, reviewed and interpreted labs, which included CBC normal chemistries with mildly low potassium troponin flat I ordered medication Tylenol and reviewed PMP when indicated. Previous records obtained and reviewed in epic no recent admissions Social determinants considered, no significant barriers Critical Interventions: None  After the interventions stated above, I reevaluated the patient and found patient's symptoms to have resolved Admission and further testing considered, she states she needs to leave to take care of her child and see any compelling evidence that she needs further workup at this time.  Recommended continued hydration and follow-up with PCP.  Return instructions discussed         Final Clinical Impression(s) / ED Diagnoses Final diagnoses:  Palpitations  Primary hypertension    Rx / DC Orders ED Discharge Orders     None         Terrilee Files, MD 03/08/23 1721

## 2023-03-08 NOTE — ED Triage Notes (Signed)
Pt with hx of htn, woke up with chest palpations that decreased with EMS ride, pt noted to be hypertensive at 171/100 for EMS, pt denies HA, vision , changes or chest pain.

## 2023-03-08 NOTE — Discharge Instructions (Addendum)
You were seen in the emergency department for palpitations.  You had an EKG and lab work that did not show any significant abnormalities.  Your blood pressure was high but it is improving at time of discharge.  Please continue your regular medications and follow-up with your primary care doctor.

## 2023-03-09 ENCOUNTER — Encounter: Payer: Self-pay | Admitting: Student

## 2023-03-09 ENCOUNTER — Ambulatory Visit (INDEPENDENT_AMBULATORY_CARE_PROVIDER_SITE_OTHER): Payer: MEDICAID | Admitting: Student

## 2023-03-09 VITALS — BP 142/98 | HR 72 | Ht 62.0 in | Wt 212.0 lb

## 2023-03-09 DIAGNOSIS — I10 Essential (primary) hypertension: Secondary | ICD-10-CM | POA: Diagnosis not present

## 2023-03-09 DIAGNOSIS — F419 Anxiety disorder, unspecified: Secondary | ICD-10-CM | POA: Diagnosis not present

## 2023-03-09 DIAGNOSIS — F32A Depression, unspecified: Secondary | ICD-10-CM | POA: Diagnosis not present

## 2023-03-09 MED ORDER — SERTRALINE HCL 25 MG PO TABS: 25.0000 mg | ORAL_TABLET | Freq: Every day | ORAL | 0 refills | Status: DC

## 2023-03-09 MED ORDER — HYDROCHLOROTHIAZIDE 25 MG PO TABS: 25.0000 mg | ORAL_TABLET | Freq: Every day | ORAL | 3 refills | Status: DC

## 2023-03-09 NOTE — Progress Notes (Signed)
    SUBJECTIVE:   CHIEF COMPLAINT / HPI:   Kendra Harris is a 43 y.o. female  presenting for hospital follow-up.  Patient reports she presented to the ED twice for palpitations and headaches.  She has a history of hypertension which has been difficult to control she is currently on 10 mg amlodipine and 12.5 mg hydrochlorothiazide.  She is compliant with her medication and denies current side effects.  Cardiovascular workup in the ED was within normal limits.  Patient endorses palpitations and chest pains at the time of cardiac monitoring and EKG.  She endorses severe anxiety regarding her health and feels that this is contributing to her clinical picture.  She endorses having anxiety for a long period of time she has never been on medication but she is interested in starting today.    She desires a future pregnancy.  Is not currently on reception or prenatal.  PERTINENT  PMH / PSH: Reviewed and updated   OBJECTIVE:   BP (!) 142/98   Pulse 72   Ht 5\' 2"  (1.575 m)   Wt 212 lb (96.2 kg)   LMP 02/18/2023 (Exact Date)   SpO2 98%   BMI 38.78 kg/m   Well-appearing, no acute distress Cardio: Regular rate, regular rhythm, no murmurs on exam. Pulm: Clear, no wheezing, no crackles. No increased work of breathing Abdominal: bowel sounds present, soft, non-tender, non-distended Extremities: no peripheral edema  Neuro: alert and oriented x3, speech normal in content, no facial asymmetry, strength intact and equal bilaterally in UE and LE, pupils equal and reactive to light.  Psych:  Cognition and judgment appear intact. Alert, communicative  and cooperative with normal attention span and concentration. No apparent delusions, illusions, hallucinations      03/09/2023    9:31 AM 01/14/2023    8:32 AM 11/18/2022   10:46 AM  PHQ9 SCORE ONLY  PHQ-9 Total Score 0 0 0      ASSESSMENT/PLAN:   Essential hypertension, benign Blood pressure not at goal within the office today.  Increase  hydrochlorothiazide to 25 mg daily.  Continue amlodipine 10 mg.  Patient scheduled for follow-up in 4 weeks  Anxiety and depression Due to patient wanting future pregnancy will start Zoloft 25 mg daily.  Patient in agreement with plan and will follow-up in 4 weeks for titration of medicines.     Glendale Chard, DO [facility]

## 2023-03-09 NOTE — Patient Instructions (Signed)
It was great to see you today!   For your blood pressure I am increasing your hydrochlorothiazide to 25 mg.  Since you have leftover medication of the 12.5 mg at home please take 2 tablets.  I sent the correct dose to the pharmacy so when she pick up the new prescription take only 1 pill/day.  I believe your chest pain and headaches are possibly due to anxiety.  I am starting you on a new medication called Zoloft.  You should take this daily either in the morning or evening depending on how you respond to it.  We have a follow-up scheduled in March to see how you are feeling.  Future Appointments  Date Time Provider Department Center  04/14/2023  9:10 AM Glendale Chard, DO [facility]    Please arrive 15 minutes before your appointment to ensure smooth check in process.    Please call the clinic at [phone] if your symptoms worsen or you have any concerns.  Thank you for allowing me to participate in your care, Dr. Glendale Chard [facility]

## 2023-03-09 NOTE — Assessment & Plan Note (Signed)
Due to patient wanting future pregnancy will start Zoloft 25 mg daily.  Patient in agreement with plan and will follow-up in 4 weeks for titration of medicines.

## 2023-03-09 NOTE — Assessment & Plan Note (Signed)
Blood pressure not at goal within the office today.  Increase hydrochlorothiazide to 25 mg daily.  Continue amlodipine 10 mg.  Patient scheduled for follow-up in 4 weeks

## 2023-03-10 ENCOUNTER — Emergency Department (HOSPITAL_COMMUNITY)
Admission: EM | Admit: 2023-03-10 | Discharge: 2023-03-10 | Disposition: A | Discharge status: Home / Self Care | Payer: MEDICAID | Attending: Emergency Medicine | Admitting: Emergency Medicine

## 2023-03-10 ENCOUNTER — Emergency Department (HOSPITAL_COMMUNITY): Payer: MEDICAID

## 2023-03-10 ENCOUNTER — Other Ambulatory Visit: Payer: Self-pay

## 2023-03-10 ENCOUNTER — Encounter (HOSPITAL_COMMUNITY): Payer: Self-pay

## 2023-03-10 DIAGNOSIS — Z79899 Other long term (current) drug therapy: Secondary | ICD-10-CM | POA: Diagnosis not present

## 2023-03-10 DIAGNOSIS — R0789 Other chest pain: Secondary | ICD-10-CM | POA: Diagnosis present

## 2023-03-10 DIAGNOSIS — Z20822 Contact with and (suspected) exposure to covid-19: Secondary | ICD-10-CM | POA: Diagnosis not present

## 2023-03-10 DIAGNOSIS — J069 Acute upper respiratory infection, unspecified: Secondary | ICD-10-CM | POA: Insufficient documentation

## 2023-03-10 DIAGNOSIS — M542 Cervicalgia: Secondary | ICD-10-CM | POA: Diagnosis not present

## 2023-03-10 DIAGNOSIS — I10 Essential (primary) hypertension: Secondary | ICD-10-CM | POA: Insufficient documentation

## 2023-03-10 LAB — BASIC METABOLIC PANEL
Anion gap: 13 (ref 5–15)
BUN: 8 mg/dL (ref 6–20)
CO2: 24 mmol/L (ref 22–32)
Calcium: 9.1 mg/dL (ref 8.9–10.3)
Chloride: 98 mmol/L (ref 98–111)
Creatinine, Ser: 0.91 mg/dL (ref 0.44–1.00)
GFR, Estimated: >60 mL/min (ref >60)
Glucose, Bld: 140 mg/dL — ABNORMAL HIGH (ref 70–99)
Potassium: 3.3 mmol/L — ABNORMAL LOW (ref 3.5–5.1)
Sodium: 135 mmol/L (ref 135–145)

## 2023-03-10 LAB — TROPONIN I (HIGH SENSITIVITY)
Troponin I (High Sensitivity): 4 ng/L (ref <18)
Troponin I (High Sensitivity): 3 ng/L (ref <18)

## 2023-03-10 LAB — RESP PANEL BY RT-PCR (RSV, FLU A&B, COVID)  RVPGX2
Influenza A by PCR: NEGATIVE
Influenza B by PCR: NEGATIVE
Resp Syncytial Virus by PCR: NEGATIVE
SARS Coronavirus 2 by RT PCR: NEGATIVE

## 2023-03-10 LAB — CBC WITH DIFFERENTIAL/PLATELET
Abs Immature Granulocytes: 0.04 10*3/uL (ref 0.00–0.07)
Basophils Absolute: 0.1 10*3/uL (ref 0.0–0.1)
Basophils Relative: 1 %
Eosinophils Absolute: 0.1 10*3/uL (ref 0.0–0.5)
Eosinophils Relative: 1 %
HCT: 39.7 % (ref 36.0–46.0)
Hemoglobin: 12.2 g/dL (ref 12.0–15.0)
Immature Granulocytes: 1 %
Lymphocytes Relative: 24 %
Lymphs Abs: 1.8 10*3/uL (ref 0.7–4.0)
MCH: 24.5 pg — ABNORMAL LOW (ref 26.0–34.0)
MCHC: 30.7 g/dL (ref 30.0–36.0)
MCV: 79.7 fL — ABNORMAL LOW (ref 80.0–100.0)
Monocytes Absolute: 0.7 10*3/uL (ref 0.1–1.0)
Monocytes Relative: 10 %
Neutro Abs: 4.7 10*3/uL (ref 1.7–7.7)
Neutrophils Relative %: 63 %
Platelets: 320 10*3/uL (ref 150–400)
RBC: 4.98 MIL/uL (ref 3.87–5.11)
RDW: 14.4 % (ref 11.5–15.5)
WBC: 7.4 10*3/uL (ref 4.0–10.5)
nRBC: 0 % (ref 0.0–0.2)

## 2023-03-10 LAB — HCG, SERUM, QUALITATIVE: Preg, Serum: NEGATIVE

## 2023-03-10 MED ORDER — ALUM & MAG HYDROXIDE-SIMETH 200-200-20 MG/5ML PO SUSP
30.0000 mL | Freq: Once | ORAL | Status: AC
  Administered 2023-03-10: 30 mL via ORAL
  Filled 2023-03-10: qty 30

## 2023-03-10 MED ORDER — ONDANSETRON 4 MG PO TBDP
4.0000 mg | ORAL_TABLET | Freq: Once | ORAL | Status: DC
  Filled 2023-03-10: qty 1

## 2023-03-10 MED ORDER — KETOROLAC TROMETHAMINE 15 MG/ML IJ SOLN
15.0000 mg | Freq: Once | INTRAMUSCULAR | Status: AC
Start: 2023-03-10 — End: 2023-03-10
  Administered 2023-03-10: 15 mg via INTRAMUSCULAR
  Filled 2023-03-10: qty 1

## 2023-03-10 MED ORDER — LIDOCAINE VISCOUS HCL 2 % MT SOLN
15.0000 mL | Freq: Once | OROMUCOSAL | Status: AC
  Administered 2023-03-10: 15 mL via ORAL
  Filled 2023-03-10: qty 15

## 2023-03-10 MED ORDER — ACETAMINOPHEN 500 MG PO TABS
1000.0000 mg | ORAL_TABLET | Freq: Once | ORAL | Status: AC
  Administered 2023-03-10: 1000 mg via ORAL
  Filled 2023-03-10: qty 2

## 2023-03-10 NOTE — ED Notes (Signed)
Pt. Pulled back to triage for re-eval. Pt. Stated, Im having burning in my chest its a 4/10. Pt stated, I went to Dr the other day and they said it was fine. I use to have indigestion.

## 2023-03-10 NOTE — ED Provider Notes (Addendum)
[place] EMERGENCY DEPARTMENT AT [facility] Provider Note   CSN: [id number] Arrival date & time: 03/10/23  0430     History  Chief Complaint  Patient presents with   Chest Pain   Cough    Kendra Harris is a 43 y.o. female with HTN, GERD, systolic murmur, menorrhagia, anemia, anxiety/depression who presents with CP, cough. She reports coughing since last night, nonproductive, no hemoptysis. Last night started having left-sided chest pain anteriorly. Also reports muscle tightness and numbness/tingling in her BL UEs and right shoulder. Has muscular tightness in her back as well. Denies f/c, vomiting but does endorse some nausea. No leg swelling, h/o DVT/PE, recent hospitalizations/surgeries, doesn't take birth control or smoke. No trauma, falls, midline back pain. No h/o malignancy. Does have h/o HTN.    Past Medical History:  Diagnosis Date   DELAYED MENSES 02/11/2010   Qualifier: Diagnosis of  By: Wallene Huh  MD, Khary     Hypertension    MIGRAINE HEADACHE 12/13/2007   Urinary tract infection 03/04/2011       Home Medications Prior to Admission medications   Medication Sig Start Date End Date Taking? Authorizing Provider  acetaminophen (TYLENOL) 500 MG tablet Take 500 mg by mouth every 6 (six) hours as needed for mild pain (pain score 1-3), moderate pain (pain score 4-6), fever or headache.   Yes [provider]  amLODipine (NORVASC) 10 MG tablet TAKE 1 TABLET(10 MG) BY MOUTH DAILY 01/14/23  Yes Rumball, Darl Householder, DO  famotidine (PEPCID) 20 MG tablet Take 1 tablet (20 mg total) by mouth 2 (two) times daily. Patient taking differently: Take 20 mg by mouth 2 (two) times daily. Take 1 tablet 30 minutes before a meal two times a day. 01/14/23  Yes Caro Laroche, DO  hydrochlorothiazide (HYDRODIURIL) 25 MG tablet Take 1 tablet (25 mg total) by mouth daily. Patient taking differently: Take 25 mg by mouth every 12 (twelve) hours. 03/09/23  Yes Glendale Chard, DO   sertraline (ZOLOFT) 25 MG tablet Take 1 tablet (25 mg total) by mouth daily. 03/09/23  Yes Glendale Chard, DO      Allergies    Patient has no known allergies.    Review of Systems   Review of Systems A 10 point review of systems was performed and is negative unless otherwise reported in HPI.  Physical Exam Updated Vital Signs BP (!) 152/94 (BP Location: Right Arm)   Pulse 67   Temp 98.6 F (37 C) (Oral)   Resp 18   Ht 5\' 2"  (1.575 m)   Wt 96.2 kg   LMP 02/18/2023 (Exact Date)   SpO2 100%   BMI 38.78 kg/m  Physical Exam General: Normal appearing female, lying in bed.  HEENT: PERRLA, Sclera anicteric, MMM, trachea midline.  Cardiology: RRR, with 3/6 holosystolic murmur. BL radial and DP pulses equal bilaterally. Reproducible pain in her Left anterior chest wall/left pectoralis muscle.  Resp: Normal respiratory rate and effort. CTAB, no wheezes, rhonchi, crackles.  Abd: Soft, non-tender, non-distended. No rebound tenderness or guarding.  GU: Deferred. MSK: No peripheral edema or signs of trauma. Extremities without deformity or TTP. No cyanosis or clubbing. Skin: warm, dry.  Neuro: A&Ox4, CNs II-XII grossly intact. 5/5 strength in UEs/LEs. Sensation grossly intact. Subjective paresthesia sensation grossly throughout UEs and right trapezius area, inconsistently.  Back: No midline C, T or L spine TTP. BL trapezius tightness and mild TTP.  Psych: Normal mood and affect.   ED Results / Procedures /  Treatments   Labs (all labs ordered are listed, but only abnormal results are displayed) Labs Reviewed  BASIC METABOLIC PANEL - Abnormal; Notable for the following components:      Result Value   Potassium 3.3 (*)    Glucose, Bld 140 (*)    All other components within normal limits  CBC WITH DIFFERENTIAL/PLATELET - Abnormal; Notable for the following components:   MCV 79.7 (*)    MCH 24.5 (*)    All other components within normal limits  RESP PANEL BY RT-PCR (RSV, FLU A&B, COVID)   RVPGX2  HCG, SERUM, QUALITATIVE  TROPONIN I (HIGH SENSITIVITY)  TROPONIN I (HIGH SENSITIVITY)    EKG None  Radiology DG Chest 2 View Result Date: 03/10/2023 CLINICAL DATA:  43 year old female with history of chest pain. Cough. EXAM: CHEST - 2 VIEW COMPARISON:  Chest x-ray 01/08/2023. FINDINGS: Lung volumes are normal. No consolidative airspace disease. No pleural effusions. No pneumothorax. No pulmonary nodule or mass noted. Pulmonary vasculature and the cardiomediastinal silhouette are within normal limits. IMPRESSION: No radiographic evidence of acute cardiopulmonary disease. Electronically Signed   By: Trudie Reed M.D.   On: 03/10/2023 06:00    Procedures Procedures    Medications Ordered in ED Medications  ondansetron (ZOFRAN-ODT) disintegrating tablet 4 mg (4 mg Oral Patient Refused/Not Given 03/10/23 1034)  alum & mag hydroxide-simeth (MAALOX/MYLANTA) 200-200-20 MG/5ML suspension 30 mL (30 mLs Oral Given 03/10/23 1025)    And  lidocaine (XYLOCAINE) 2 % viscous mouth solution 15 mL (15 mLs Oral Given 03/10/23 1025)  acetaminophen (TYLENOL) tablet 1,000 mg (1,000 mg Oral Given 03/10/23 1025)  ketorolac (TORADOL) 15 MG/ML injection 15 mg (15 mg Intramuscular Given 03/10/23 1025)    ED Course/ Medical Decision Making/ A&P                          Medical Decision Making Amount and/or Complexity of Data Reviewed Labs: ordered. Decision-making details documented in ED Course. Radiology: ordered.  Risk OTC drugs. Prescription drug management.    This patient presents to the ED for concern of CP, cough; this involves an extensive number of treatment options, and is a complaint that carries with it a high risk of complications and morbidity.  I considered the following differential and admission for this acute, potentially life threatening condition. However patient is overall very well-appearing.  MDM:    DDX for chest pain includes but is not limited to: EKG w/o signs  of ischemia and single troponin negative. CXR doesn't show PNA, pleural effusion, or PTX. CXR doesn't show widened mediastinum, and she has equal pulses in all extremities, she appears very comfortable, very low c/f aortic dissection. She does report intermittent tingling in BL UEs, as well as in R trapezius area, but this is not c/w CVA, and she has no back or neck pain in the midline, no trauma/falls to indicate cervical radiculopathy. Paresthesias do not follow a dermatomal distribution and she reports them intermittently throughout UEs. I suspect this is likely related to her muscle tightness/pain vs coughing/hyperventilation. Patient PERCs out for PE with no risk factors or e/o DVT on exam. No abd pain or RUQ TTP to indicate biliary disease. She does report h/o indigestion/acid reflux and will give GI cocktail. She has TTP to her anterior left chest wall which reproduces her pain, and with reported muscle tightness/pain as well as cough, she is likely having viral upper respiratory infection. Treated w/ tylenol/toradol, GI cocktail, and zofran.  Offered patient time with reevaluation after meds but she states that she needs to leave the department to relieve her babysitter for her children.   Clinical Course as of 03/10/23 1118  Thu Mar 10, 2023  1610 Resp panel by RT-PCR (RSV, Flu A&B, Covid) Anterior Nasal Swab neg [HN]  0944 CBC with Differential(!) Unremarkable in the context of this patient's presentation  [HN]  0944 Preg, Serum: NEGATIVE neg [HN]  0944 Basic metabolic panel(!) Unremarkable in the context of this patient's presentation  [HN]  0944 Troponin I (High Sensitivity): 3 neg [HN]    Clinical Course User Index [HN] Loetta Rough, MD    Labs: I Ordered, and personally interpreted labs.  The pertinent results include:  those listed above  Imaging Studies ordered: CXR ordered from triage I independently visualized and interpreted imaging. I agree with the radiologist  interpretation  Additional history obtained from chart review.    Cardiac Monitoring: The patient was maintained on a cardiac monitor.  I personally viewed and interpreted the cardiac monitored which showed an underlying rhythm of: NSR  Reevaluation: After the interventions noted above, I reevaluated the patient and found that they have :improved  Social Determinants of Health: Lives independently  Disposition:  DC w/ discharge instructions/return precautions. All questions answered to patient's satisfaction.    Co morbidities that complicate the patient evaluation  Past Medical History:  Diagnosis Date   DELAYED MENSES 02/11/2010   Qualifier: Diagnosis of  By: Wallene Huh  MD, Khary     Hypertension    MIGRAINE HEADACHE 12/13/2007   Urinary tract infection 03/04/2011     Medicines Meds ordered this encounter  Medications   AND Linked Order Group    alum & mag hydroxide-simeth (MAALOX/MYLANTA) 200-200-20 MG/5ML suspension 30 mL    lidocaine (XYLOCAINE) 2 % viscous mouth solution 15 mL   acetaminophen (TYLENOL) tablet 1,000 mg   ketorolac (TORADOL) 15 MG/ML injection 15 mg   ondansetron (ZOFRAN-ODT) disintegrating tablet 4 mg    I have reviewed the patients home medicines and have made adjustments as needed  Problem List / ED Course: Problem List Items Addressed This Visit   None Visit Diagnoses       Chest wall pain    -  Primary     Viral URI with cough              Loetta Rough, MD 03/10/23 1121

## 2023-03-10 NOTE — Patient Instructions (Incomplete)
It was great to see you! Thank you for allowing me to participate in your care!  I recommend that you always bring your medications to each appointment as this makes it easy to ensure we are on the correct medications and helps [place] not miss when refills are needed.  Our plans for today:  - Follow up from ED visit -   We are checking some labs today, I will call you if they are abnormal will send you a MyChart message or a letter if they are normal.  If you do not hear about your labs in the next 2 weeks please let us know.***  Take care and seek immediate care sooner if you develop any concerns.   Dr. Bess Kinds, MD [facility]

## 2023-03-10 NOTE — Discharge Instructions (Signed)
Thank you for coming to [facility] Emergency Department. You were seen for chest pain, cough. We did an exam, labs, and imaging, and these showed likely a viral infection and body aches with chest wall pain. You can alternate taking Tylenol and ibuprofen as needed for pain. You can take 650mg  tylenol (acetaminophen) every 4-6 hours, and 600 mg ibuprofen 3 times a day. You can also take pepcid 20 mg twice per day for acid reflux.  Please follow up with your primary care provider within 1 week.   Do not hesitate to return to the ED or call 911 if you experience: -Worsening symptoms -Asymmetric numbness, weakness, facial droop -Lightheadedness, passing out -Fevers/chills -Anything else that concerns you

## 2023-03-10 NOTE — ED Notes (Signed)
Pt. Complaining of new chest pain in SORT. Triage RN notified on Epic

## 2023-03-10 NOTE — ED Triage Notes (Signed)
Reports went to [facility] yesterday for chest pain and all they did was an EKG.  Patient complains of chest pain that is tight and a cough.

## 2023-03-10 NOTE — Progress Notes (Deleted)
  SUBJECTIVE:   CHIEF COMPLAINT / HPI:   F/u ED visit -03/10/23 for chest wall pain / URI, Neg Trops, BMP, CBC, U. Preg, EKG, CXR -1/28 for palpitations w/ Neg BMP, CBC, Trop, EKG > panic attack?  PERTINENT  PMH / PSH:    OBJECTIVE:  LMP 02/18/2023 (Exact Date)  Physical Exam   ASSESSMENT/PLAN:   Assessment & Plan  No follow-ups on file. Bess Kinds, MD 03/10/2023, 6:10 PM PGY-***, [facility] {    This will disappear when note is signed, click to select method of visit    :1}

## 2023-03-11 ENCOUNTER — Inpatient Hospital Stay: Payer: MEDICAID

## 2023-03-12 ENCOUNTER — Ambulatory Visit (HOSPITAL_COMMUNITY)
Admission: EM | Admit: 2023-03-12 | Discharge: 2023-03-12 | Disposition: A | Discharge status: Home / Self Care | Payer: MEDICAID | Attending: Emergency Medicine | Admitting: Emergency Medicine

## 2023-03-12 ENCOUNTER — Encounter (HOSPITAL_COMMUNITY): Payer: Self-pay

## 2023-03-12 DIAGNOSIS — M79605 Pain in left leg: Secondary | ICD-10-CM

## 2023-03-12 DIAGNOSIS — M79604 Pain in right leg: Secondary | ICD-10-CM

## 2023-03-12 DIAGNOSIS — M62830 Muscle spasm of back: Secondary | ICD-10-CM

## 2023-03-12 DIAGNOSIS — G44209 Tension-type headache, unspecified, not intractable: Secondary | ICD-10-CM | POA: Diagnosis not present

## 2023-03-12 MED ORDER — METHOCARBAMOL 500 MG PO TABS: 500.0000 mg | ORAL_TABLET | Freq: Two times a day (BID) | ORAL | 0 refills | Status: DC

## 2023-03-12 NOTE — ED Triage Notes (Signed)
Patient here today with c/o tightness in her thighs and numbness in her feet since this morning.   Patient also c/o headache X 2 days. She has been taking Tylenol with no relief.

## 2023-03-12 NOTE — Discharge Instructions (Addendum)
Take the muscle relaxer as needed.  This may cause sedation, do not drink alcohol or drive on this medication.  You can also heat and gently stretch your trapezius.  This may be what is causing your headache.  It is unclear what is causing your numbness in your lower legs, please follow-up with your primary care provider for further advanced evaluation.  Return to clinic for any new or urgent symptoms.  Seek immediate care at the nearest emergency department for any sudden severe thunderclap headache, vision loss, one-sided weakness, or new emergent symptoms.

## 2023-03-12 NOTE — ED Provider Notes (Signed)
[facility]    CSN: [id number] Arrival date & time: 03/12/23  1020      History   Chief Complaint Chief Complaint  Patient presents with   Numbness   Headache    HPI Kendra Harris is a 43 y.o. female.   Patient presents to clinic complaining of right sided trapezius pain, posterior tension headache, and waking up this morning and feeling like her thighs and lower legs were tight.  She has had some numbness in her feet since this morning as well.  Has been taking Tylenol for her symptoms without much relief.  Reports she was recently diagnosed with a trapezius muscle strain was not giving any muscle relaxers.  Reassuringly, she was recently seen (2 days ago, 03/09/22) in the emergency department with a benign workup for chest pain and diagnosed with a viral URI.  COVID and flu testing negative.  Chest x-ray unremarkable.  Basic labs reassuring.  Was seen the day prior to this, 03/08/2022, at her primary care provider.  Has not had any leg swelling.  She does not smoke.  No history of DVT or PE.  No chest pain or shortness of breath.  No recent travel, hospitalization or immobilization.  No trauma or falls.  The history is provided by the patient and medical records.  Headache   Past Medical History:  Diagnosis Date   DELAYED MENSES 02/11/2010   Qualifier: Diagnosis of  By: Wallene Huh  MD, Khary     Hypertension    MIGRAINE HEADACHE 12/13/2007   Urinary tract infection 03/04/2011    Patient Active Problem List   Diagnosis Date Noted   Systolic murmur 01/14/2023   Desire for pregnancy 01/14/2023   Epidermoid cyst of skin of chest 04/29/2022   Trapezius muscle strain 02/07/2022   GERD (gastroesophageal reflux disease) 05/07/2021   Tension headache 05/07/2021   Anemia 04/11/2019   Menorrhagia 04/27/2013   Anxiety and depression 07/24/2010   Back pain 02/11/2010   Essential hypertension, benign 08/31/2007   Morbid obesity with BMI of 45.0-49.9, adult (HCC)  04/07/2006    Past Surgical History:  Procedure Laterality Date   NO PAST SURGERIES      OB History     Gravida  5   Para  5   Term  5   Preterm  0   AB  0   Living  5      SAB  0   IAB  0   Ectopic  0   Multiple  0   Live Births  5            Home Medications    Prior to Admission medications   Medication Sig Start Date End Date Taking? Authorizing Provider  methocarbamol (ROBAXIN) 500 MG tablet Take 1 tablet (500 mg total) by mouth 2 (two) times daily. 03/12/23  Yes Rinaldo Ratel, [place] N, FNP  acetaminophen (TYLENOL) 500 MG tablet Take 500 mg by mouth every 6 (six) hours as needed for mild pain (pain score 1-3), moderate pain (pain score 4-6), fever or headache.    [provider]  amLODipine (NORVASC) 10 MG tablet TAKE 1 TABLET(10 MG) BY MOUTH DAILY 01/14/23   Caro Laroche, DO  hydrochlorothiazide (HYDRODIURIL) 25 MG tablet Take 1 tablet (25 mg total) by mouth daily. Patient taking differently: Take 25 mg by mouth every 12 (twelve) hours. 03/09/23   Glendale Chard, DO  sertraline (ZOLOFT) 25 MG tablet Take 1 tablet (25 mg total) by mouth daily.  03/09/23   Glendale Chard, DO    Family History Family History  Problem Relation Age of Onset   Healthy Mother    Sickle cell trait Sister     Social History Social History   Tobacco Use   Smoking status: Never    Passive exposure: Never   Smokeless tobacco: Never  Vaping Use   Vaping status: Never Used  Substance Use Topics   Alcohol use: No   Drug use: No     Allergies   Patient has no known allergies.   Review of Systems Review of Systems  Per HPI   Physical Exam Triage Vital Signs ED Triage Vitals  Encounter Vitals Group     BP 03/12/23 1139 (!) 141/98     Systolic BP Percentile --      Diastolic BP Percentile --      Pulse Rate 03/12/23 1139 76     Resp 03/12/23 1139 16     Temp 03/12/23 1139 98.5 F (36.9 C)     Temp Source 03/12/23 1139 Oral     SpO2 03/12/23 1139 97  %     Weight 03/12/23 1140 210 lb (95.3 kg)     Height 03/12/23 1140 5\' 2"  (1.575 m)     Head Circumference --      Peak Flow --      Pain Score 03/12/23 1142 4     Pain Loc --      Pain Education --      Exclude from Growth Chart --    No data found.  Updated Vital Signs BP (!) 141/98 (BP Location: Right Arm)   Pulse 76   Temp 98.5 F (36.9 C) (Oral)   Resp 16   Ht 5\' 2"  (1.575 m)   Wt 210 lb (95.3 kg)   LMP 03/10/2023 (Exact Date)   SpO2 97%   BMI 38.41 kg/m   Visual Acuity Right Eye Distance:   Left Eye Distance:   Bilateral Distance:    Right Eye Near:   Left Eye Near:    Bilateral Near:     Physical Exam Vitals and nursing note reviewed.  Constitutional:      Appearance: Normal appearance. She is well-developed.  HENT:     Head: Normocephalic and atraumatic.     Right Ear: External ear normal.     Left Ear: External ear normal.     Nose: Nose normal.     Mouth/Throat:     Mouth: Mucous membranes are moist.  Eyes:     Pupils: Pupils are equal, round, and reactive to light.  Cardiovascular:     Rate and Rhythm: Normal rate and regular rhythm.     Pulses: Normal pulses.     Heart sounds: Murmur heard.  Pulmonary:     Effort: Pulmonary effort is normal. No respiratory distress.     Breath sounds: Normal breath sounds.  Musculoskeletal:        General: Normal range of motion.     Cervical back: Normal range of motion. Tenderness present.     Right lower leg: No edema.     Left lower leg: No edema.  Skin:    General: Skin is warm and dry.  Neurological:     General: No focal deficit present.     Mental Status: She is alert and oriented to person, place, and time.     GCS: GCS eye subscore is 4. GCS verbal subscore is 5. GCS motor subscore is 6.  Psychiatric:        Mood and Affect: Mood normal.        Behavior: Behavior normal.      UC Treatments / Results  Labs (all labs ordered are listed, but only abnormal results are displayed) Labs  Reviewed - No data to display  EKG   Radiology No results found.  Procedures Procedures (including critical care time)  Medications Ordered in UC Medications - No data to display  Initial Impression / Assessment and Plan / UC Course  I have reviewed the triage vital signs and the nursing notes.  Pertinent labs & imaging results that were available during my care of the patient were reviewed by me and considered in my medical decision making (see chart for details).  Vitals and triage reviewed, lungs are vesicular, heart with regular rate and rhythm.  3 out of 6 systolic murmur noted.  No lower extremity edema, pedal pulses are 2+ bilaterally.  No pain with palpation throughout both legs.  Right-sided trapezius muscular tenderness to palpation and with range of motion.  Will trial muscle relaxer.  Unclear etiology to bilateral leg pain, could be manifestation of a viral illness.  Symptomatic management encouraged.  Primary care provider follow-up encouraged as well for some numbness and tingling in her feet as she appears to have good circulation and swelling.  Low concern for DVT at this time.  Recent reassuring emergency department visit.  Symptomatic management encouraged.  Emergency precautions given, no questions at this time.     Final Clinical Impressions(s) / UC Diagnoses   Final diagnoses:  Tension headache  Spasm of right trapezius muscle  Pain in both lower extremities     Discharge Instructions      Take the muscle relaxer as needed.  This may cause sedation, do not drink alcohol or drive on this medication.  You can also heat and gently stretch your trapezius.  This may be what is causing your headache.  It is unclear what is causing your numbness in your lower legs, please follow-up with your primary care provider for further advanced evaluation.  Return to clinic for any new or urgent symptoms.  Seek immediate care at the nearest emergency department for any sudden  severe thunderclap headache, vision loss, one-sided weakness, or new emergent symptoms.    ED Prescriptions     Medication Sig Dispense Auth. Provider   methocarbamol (ROBAXIN) 500 MG tablet Take 1 tablet (500 mg total) by mouth 2 (two) times daily. 20 tablet Creighton Longley, [place], [place]      PDMP not reviewed this encounter.   Aarin Sparkman, [place] N, [place] 03/12/23 1215

## 2023-03-15 ENCOUNTER — Ambulatory Visit (HOSPITAL_COMMUNITY)
Admission: EM | Admit: 2023-03-15 | Discharge: 2023-03-16 | Disposition: A | Discharge status: Other Acute Inpatient Hospital | Payer: MEDICAID | Attending: Psychiatry | Admitting: Psychiatry

## 2023-03-15 DIAGNOSIS — F419 Anxiety disorder, unspecified: Secondary | ICD-10-CM | POA: Diagnosis not present

## 2023-03-15 DIAGNOSIS — F333 Major depressive disorder, recurrent, severe with psychotic symptoms: Secondary | ICD-10-CM | POA: Diagnosis not present

## 2023-03-15 DIAGNOSIS — F322 Major depressive disorder, single episode, severe without psychotic features: Secondary | ICD-10-CM | POA: Insufficient documentation

## 2023-03-15 LAB — URINALYSIS, COMPLETE (UACMP) WITH MICROSCOPIC
Bacteria, UA: NONE SEEN
Bilirubin Urine: NEGATIVE
Glucose, UA: NEGATIVE mg/dL
Ketones, ur: NEGATIVE mg/dL
Leukocytes,Ua: NEGATIVE
Nitrite: NEGATIVE
Protein, ur: NEGATIVE mg/dL
Specific Gravity, Urine: 1.019 (ref 1.005–1.030)
pH: 6 (ref 5.0–8.0)

## 2023-03-15 LAB — CBC WITH DIFFERENTIAL/PLATELET
Abs Immature Granulocytes: 0.01 10*3/uL (ref 0.00–0.07)
Basophils Absolute: 0 10*3/uL (ref 0.0–0.1)
Basophils Relative: 1 %
Eosinophils Absolute: 0 10*3/uL (ref 0.0–0.5)
Eosinophils Relative: 0 %
HCT: 40.2 % (ref 36.0–46.0)
Hemoglobin: 12.1 g/dL (ref 12.0–15.0)
Immature Granulocytes: 0 %
Lymphocytes Relative: 17 %
Lymphs Abs: 1.2 10*3/uL (ref 0.7–4.0)
MCH: 24.5 pg — ABNORMAL LOW (ref 26.0–34.0)
MCHC: 30.1 g/dL (ref 30.0–36.0)
MCV: 81.5 fL (ref 80.0–100.0)
Monocytes Absolute: 0.5 10*3/uL (ref 0.1–1.0)
Monocytes Relative: 7 %
Neutro Abs: 5.2 10*3/uL (ref 1.7–7.7)
Neutrophils Relative %: 75 %
Platelets: 330 10*3/uL (ref 150–400)
RBC: 4.93 MIL/uL (ref 3.87–5.11)
RDW: 14.5 % (ref 11.5–15.5)
WBC: 7 10*3/uL (ref 4.0–10.5)
nRBC: 0 % (ref 0.0–0.2)

## 2023-03-15 LAB — COMPREHENSIVE METABOLIC PANEL
ALT: 15 U/L (ref 0–44)
AST: 17 U/L (ref 15–41)
Albumin: 4 g/dL (ref 3.5–5.0)
Alkaline Phosphatase: 73 U/L (ref 38–126)
Anion gap: 12 (ref 5–15)
BUN: 7 mg/dL (ref 6–20)
CO2: 27 mmol/L (ref 22–32)
Calcium: 9.5 mg/dL (ref 8.9–10.3)
Chloride: 99 mmol/L (ref 98–111)
Creatinine, Ser: 0.86 mg/dL (ref 0.44–1.00)
GFR, Estimated: >60 mL/min (ref >60)
Glucose, Bld: 117 mg/dL — ABNORMAL HIGH (ref 70–99)
Potassium: 2.6 mmol/L — CL (ref 3.5–5.1)
Sodium: 138 mmol/L (ref 135–145)
Total Bilirubin: 0.3 mg/dL (ref 0.0–1.2)
Total Protein: 9 g/dL — ABNORMAL HIGH (ref 6.5–8.1)

## 2023-03-15 LAB — POCT URINE DRUG SCREEN - MANUAL ENTRY (I-SCREEN)
POC Amphetamine UR: NOT DETECTED
POC Buprenorphine (BUP): NOT DETECTED
POC Cocaine UR: NOT DETECTED
POC Marijuana UR: NOT DETECTED
POC Methadone UR: NOT DETECTED
POC Methamphetamine UR: NOT DETECTED
POC Morphine: NOT DETECTED
POC Oxazepam (BZO): NOT DETECTED
POC Oxycodone UR: NOT DETECTED
POC Secobarbital (BAR): NOT DETECTED

## 2023-03-15 LAB — VITAMIN B12: Vitamin B-12: 535 pg/mL (ref 180–914)

## 2023-03-15 LAB — POC URINE PREG, ED: Preg Test, Ur: NEGATIVE

## 2023-03-15 LAB — ETHANOL: Alcohol, Ethyl (B): <10 mg/dL (ref <10)

## 2023-03-15 LAB — HEMOGLOBIN A1C
Hgb A1c MFr Bld: 6.1 % — ABNORMAL HIGH (ref 4.8–5.6)
Mean Plasma Glucose: 128.37 mg/dL

## 2023-03-15 LAB — MAGNESIUM: Magnesium: 2 mg/dL (ref 1.7–2.4)

## 2023-03-15 LAB — TSH: TSH: 0.803 u[IU]/mL (ref 0.350–4.500)

## 2023-03-15 LAB — POTASSIUM: Potassium: 3.4 mmol/L — ABNORMAL LOW (ref 3.5–5.1)

## 2023-03-15 MED ORDER — ALUM & MAG HYDROXIDE-SIMETH 200-200-20 MG/5ML PO SUSP
30.0000 mL | ORAL | Status: DC | PRN
  Administered 2023-03-15: 30 mL via ORAL
  Filled 2023-03-15: qty 30

## 2023-03-15 MED ORDER — HYDROCHLOROTHIAZIDE 25 MG PO TABS
25.0000 mg | ORAL_TABLET | Freq: Every day | ORAL | Status: DC
Start: 2023-03-15 — End: 2023-03-16
  Administered 2023-03-15: 25 mg via ORAL
  Filled 2023-03-15: qty 1

## 2023-03-15 MED ORDER — ARIPIPRAZOLE 5 MG PO TABS
5.0000 mg | ORAL_TABLET | Freq: Every day | ORAL | Status: DC
  Administered 2023-03-15: 5 mg via ORAL
  Filled 2023-03-15: qty 1

## 2023-03-15 MED ORDER — POTASSIUM CHLORIDE CRYS ER 20 MEQ PO TBCR
40.0000 meq | EXTENDED_RELEASE_TABLET | Freq: Once | ORAL | Status: AC
  Administered 2023-03-15: 40 meq via ORAL
  Filled 2023-03-15: qty 2

## 2023-03-15 MED ORDER — TRAZODONE HCL 50 MG PO TABS
50.0000 mg | ORAL_TABLET | Freq: Every evening | ORAL | Status: DC | PRN
  Administered 2023-03-15: 50 mg via ORAL
  Filled 2023-03-15: qty 1

## 2023-03-15 MED ORDER — SERTRALINE HCL 25 MG PO TABS
25.0000 mg | ORAL_TABLET | Freq: Every day | ORAL | Status: DC
Start: 2023-03-15 — End: 2023-03-16
  Administered 2023-03-15: 25 mg via ORAL
  Filled 2023-03-15: qty 1

## 2023-03-15 MED ORDER — MAGNESIUM HYDROXIDE 400 MG/5ML PO SUSP: 30.0000 mL | Freq: Every day | ORAL | Status: DC | PRN

## 2023-03-15 MED ORDER — METHOCARBAMOL 500 MG PO TABS
500.0000 mg | ORAL_TABLET | Freq: Two times a day (BID) | ORAL | Status: DC
  Administered 2023-03-15 (×2): 500 mg via ORAL
  Filled 2023-03-15 (×2): qty 1

## 2023-03-15 MED ORDER — ACETAMINOPHEN 325 MG PO TABS
650.0000 mg | ORAL_TABLET | Freq: Four times a day (QID) | ORAL | Status: DC | PRN
  Administered 2023-03-15: 650 mg via ORAL
  Filled 2023-03-15: qty 2

## 2023-03-15 MED ORDER — ARIPIPRAZOLE 5 MG PO TABS: 5.0000 mg | ORAL_TABLET | Freq: Every day | ORAL | Status: DC

## 2023-03-15 MED ORDER — AMLODIPINE BESYLATE 10 MG PO TABS
10.0000 mg | ORAL_TABLET | Freq: Every day | ORAL | Status: DC
  Administered 2023-03-15: 10 mg via ORAL
  Filled 2023-03-15: qty 1

## 2023-03-15 MED ORDER — HYDROXYZINE HCL 25 MG PO TABS
25.0000 mg | ORAL_TABLET | Freq: Three times a day (TID) | ORAL | Status: DC | PRN
  Administered 2023-03-15: 25 mg via ORAL
  Filled 2023-03-15: qty 1

## 2023-03-15 NOTE — Progress Notes (Signed)
 Pt was accepted to [facility] TODAY 03/15/2023; Bed Assignment [facility] BMU FAX Number [phone] Address: [address] [place], [place], [place] [address]  Pt meets inpatient criteria per Elveria Mardy PIETY  Attending Physician will be Dr. Allyn Donnelly COME  Report can be called to: [phone]  Pt can arrive after: BED IS READY  Care Team notified: Night CONE [facility] AC Luke Sprang, RN, Night [facility] Krista Jane OBIE Cordella Jessie CELESTINA Donnice Merriam OBIE, Kentoya Kelly-Savage,RN, Carolyn Coleman,NP, [address] [place] Bryant,LPN, Shirley Burton,RN, Demetria [place], Day CONE [facility] AC Danika Riley, RN   Mitzie GEANNIE Pinal, MSW, [facility] 03/15/2023 11:09 PM

## 2023-03-15 NOTE — ED Provider Notes (Addendum)
 [facility] Continuous Assessment Admission H&P  Date: 03/15/23 Patient Name: Kendra Harris MRN: [id number] Chief Complaint: I been so depressed for 2 weeks.  Diagnoses:  Final diagnoses:  Severe episode of recurrent major depressive disorder, with psychotic features (HCC)    HPI: patient presented to [facility] as a walk in unaccompanied  with complaints of I been so depressed for 2 weeks.  Kendra Harris, 43 y.o., female patient seen face to face by this provider, consulted with Dr. Elam; and chart reviewed on 03/15/23.  Per chart review patient has a past psychiatric history of MDD and anxiety.  She has had 1 inpatient psychiatric admission that she can recall at [facility].  She currently has no outpatient providers in place.  She does not take any psychiatric medications.  She lives in the home with her boyfriend and 5 children ages 59, 76, 65, 38, and 41.  She is currently unemployed.  She denies all substance use.  On evaluation patient is observed sitting in the assessment room in no acute distress.  She is alert/oriented x 4, cooperative, and attentive.  She has normal speech and behavior.  She is tearful and discusses her depression.  She first experienced depression when she lost her grandmother around 2016.  Per chart review her admission at [facility] was in 2020.  She experienced depression with some psychotic symptoms at that time.  She stopped taking medications shortly after discharge, and feels she has done well until 2 weeks ago.  She does not identify any specific stressor/trigger.  She began to notice an increase in her depression and anxiety.  She is endorsing feelings of hopelessness, helplessness, tearfulness, irritability, worthlessness, decreased focus, decreased motivation, decreased appetite and increased sleep.  She has difficulty getting out of bed during the day.  She has also lost roughly 14 pounds in the past 2 weeks due to decreased appetite.  She has  been walking outside often and states, I do not even know where I am going I am just walking.  She has a depressed affect and is tearful throughout assessment.  She has also been experiencing auditory hallucinations.  She hears a female voice that tells her I am worthless, I mean nothing, and to kill myself.  She has considered the possibility of being hit by car as her plan.  She cannot verbally contract for safety.  She denies access to firearms/weapons.  She denies homicidal ideations.  She denies visual hallucinations.  She does not appear to be responding to internal/external stimuli.  She does not appear manic, psychotic, paranoid or delusional.  She is able to answer questions appropriately.  Discussed inpatient psychiatric admission with patient and she is in agreement.  Of note patient was recently seen at [facility] ED related to right sided trapezius pain, posterior tension headache, thighs and leg tightness, and numbness in her feet.  She was prescribed methocarbamol  and Zoloft  at that time.  Reports she did not start taking the Zoloft  but has taken the methocarbamol  for pain and it has been somewhat helpful.  She is currently endorsing some upper right trapezius/shoulder pain.  States the pain at times does radiate up into the neck area.    Total Time spent with patient: 30 minutes  Musculoskeletal  Strength & Muscle Tone: within normal limits Gait & Station: normal Patient leans: N/A  Psychiatric Specialty Exam  Presentation General Appearance:  Casual  Eye Contact: Good  Speech: Clear and Coherent; Normal Rate  Speech Volume: Normal  Handedness: Right   Mood and Affect  Mood: Anxious; Depressed; Worthless  Affect: Solicitor Processes: Coherent  Descriptions of Associations:Intact  Orientation:Full (Time, Place and Person)  Thought Content:Logical    Hallucinations:Hallucinations: Auditory Description of Auditory Hallucinations:  Hears a female voice that tells her she is worthless and to kill herself  Ideas of Reference:None  Suicidal Thoughts:Suicidal Thoughts: Yes, Active SI Active Intent and/or Plan: With Intent; With Plan; With Means to Carry Out  Homicidal Thoughts:Homicidal Thoughts: No   Sensorium  Memory: Immediate Good; Recent Good; Remote Good  Judgment: Good  Insight: Good   Executive Functions  Concentration: Good  Attention Span: Good  Recall: Good  Fund of Knowledge: Good  Language: Good   Psychomotor Activity  Psychomotor Activity: Psychomotor Activity: Normal   Assets  Assets: Physical Health; Resilience; Social Support; Manufacturing Systems Engineer; Desire for Improvement; Financial Resources/Insurance; Housing; Intimacy; Leisure Time   Sleep  Sleep: Number of Hours of Sleep: 0 (Sleeps all day)   Nutritional Assessment (For OBS and FBC admissions only) Has the patient had a weight loss or gain of 10 pounds or more in the last 3 months?: Yes Has the patient had a decrease in food intake/or appetite?: Yes Does the patient have dental problems?: No Does the patient have eating habits or behaviors that may be indicators of an eating disorder including binging or inducing vomiting?: No Has the patient recently lost weight without trying?: 2    Physical Exam Vitals and nursing note reviewed.  Constitutional:      Appearance: Normal appearance.  Eyes:     General:        Right eye: No discharge.        Left eye: No discharge.  Cardiovascular:     Rate and Rhythm: Normal rate.  Pulmonary:     Effort: Pulmonary effort is normal. No respiratory distress.  Musculoskeletal:        General: Tenderness (Right upper  shoulder area) present.     Cervical back: Normal range of motion.  Skin:    Coloration: Skin is not jaundiced or pale.  Neurological:     Mental Status: She is alert and oriented to person, place, and time.  Psychiatric:        Attention and Perception:  Attention normal. She perceives auditory hallucinations.        Mood and Affect: Mood is anxious and depressed. Affect is tearful.        Speech: Speech normal.        Behavior: Behavior normal. Behavior is cooperative.        Thought Content: Thought content includes suicidal ideation. Thought content includes suicidal plan.        Cognition and Memory: Cognition normal.        Judgment: Judgment normal.    Review of Systems  Constitutional:  Negative for chills and fever.  HENT:  Negative for hearing loss and tinnitus.   Respiratory:  Negative for cough.   Gastrointestinal:  Negative for nausea and vomiting.  Musculoskeletal:  Positive for neck pain (Pain from right upper shoulder that sometimes radiates up into the neck when she has muscle spasm).  Psychiatric/Behavioral:  Positive for depression, hallucinations and suicidal ideas. The patient is nervous/anxious.     Blood pressure (!) 132/92, pulse 72, temperature 98.6 F (37 C), temperature source Oral, resp. rate 17, last menstrual period 03/10/2023, SpO2 100%. There is no height or weight on file  to calculate BMI.  Past Psychiatric History: MDD with psychotic behavior and anxiety, 1 inpatient psychiatric admission 2020 at: [facility]  Patient has had medication trials that include modafinil , Vraylar , Prozac , and Abilify .  Per chart review patient had good result with good results.  Is the patient at risk to self? Yes  Has the patient been a risk to self in the past 6 months? Yes .    Has the patient been a risk to self within the distant past? Yes   Is the patient a risk to others? No   Has the patient been a risk to others in the past 6 months? No   Has the patient been a risk to others within the distant past? No   Past Medical History:  Past Medical History:  Diagnosis Date   DELAYED MENSES 02/11/2010   Qualifier: Diagnosis of  By: Reynaldo  MD, Khary     Hypertension    MIGRAINE HEADACHE 12/13/2007   Urinary tract infection  03/04/2011     Family History:  Family History  Problem Relation Age of Onset   Healthy Mother    Sickle cell trait Sister      Social History:  Socioeconomic History   Marital status: Single in relationship lives with Boy friend    Spouse name: Not on file   Number of children: 5   Years of education:    Highest education level: Not on file  Occupational History   Not on file  Tobacco Use   Smoking status: Never    Passive exposure: Never   Smokeless tobacco: Never  Vaping Use   Vaping status: Never Used  Substance and Sexual Activity   Alcohol use: No   Drug use: No    Last Labs:  Admission on 03/10/2023, Discharged on 03/10/2023  Component Date Value Ref Range Status   Sodium 03/10/2023 135  135 - 145 mmol/L Final   Potassium 03/10/2023 3.3 (L)  3.5 - 5.1 mmol/L Final   Chloride 03/10/2023 98  98 - 111 mmol/L Final   CO2 03/10/2023 24  22 - 32 mmol/L Final   Glucose, Bld 03/10/2023 140 (H)  70 - 99 mg/dL Final   Glucose reference range applies only to samples taken after fasting for at least 8 hours.   BUN 03/10/2023 8  6 - 20 mg/dL Final   Creatinine, Ser 03/10/2023 0.91  0.44 - 1.00 mg/dL Final   Calcium 98/69/7974 9.1  8.9 - 10.3 mg/dL Final   GFR, Estimated 03/10/2023 >60  >60 mL/min Final   Comment: (NOTE) Calculated using the CKD-EPI Creatinine Equation (2021)    Anion gap 03/10/2023 13  5 - 15 Final   Performed at [facility], [address], [place], [place] [address]   Troponin I (High Sensitivity) 03/10/2023 4  <18 ng/L Final   Comment: (NOTE) Elevated high sensitivity troponin I (hsTnI) values and significant  changes across serial measurements may suggest ACS but many other  chronic and acute conditions are known to elevate hsTnI results.  Refer to the Links section for chest pain algorithms and additional  guidance. Performed at [facility], [address], [place], [place] [address]    Preg, Serum 03/10/2023 NEGATIVE  NEGATIVE  Final   Comment:        THE SENSITIVITY OF THIS METHODOLOGY IS >10 mIU/mL. Performed at [facility], [address], [place], [place] [address]    WBC 03/10/2023 7.4  4.0 - 10.5  K/uL Final   RBC 03/10/2023 4.98  3.87 - 5.11 MIL/uL Final   Hemoglobin 03/10/2023 12.2  12.0 - 15.0 g/dL Final   HCT 98/69/7974 39.7  36.0 - 46.0 % Final   MCV 03/10/2023 79.7 (L)  80.0 - 100.0 fL Final   MCH 03/10/2023 24.5 (L)  26.0 - 34.0 pg Final   MCHC 03/10/2023 30.7  30.0 - 36.0 g/dL Final   RDW 98/69/7974 14.4  11.5 - 15.5 % Final   Platelets 03/10/2023 320  150 - 400 K/uL Final   nRBC 03/10/2023 0.0  0.0 - 0.2 % Final   Neutrophils Relative % 03/10/2023 63  % Final   Neutro Abs 03/10/2023 4.7  1.7 - 7.7 K/uL Final   Lymphocytes Relative 03/10/2023 24  % Final   Lymphs Abs 03/10/2023 1.8  0.7 - 4.0 K/uL Final   Monocytes Relative 03/10/2023 10  % Final   Monocytes Absolute 03/10/2023 0.7  0.1 - 1.0 K/uL Final   Eosinophils Relative 03/10/2023 1  % Final   Eosinophils Absolute 03/10/2023 0.1  0.0 - 0.5 K/uL Final   Basophils Relative 03/10/2023 1  % Final   Basophils Absolute 03/10/2023 0.1  0.0 - 0.1 K/uL Final   Immature Granulocytes 03/10/2023 1  % Final   Abs Immature Granulocytes 03/10/2023 0.04  0.00 - 0.07 K/uL Final   Performed at [facility], [address] [place], [place] [address]   SARS Coronavirus 2 by RT PCR 03/10/2023 NEGATIVE  NEGATIVE Final   Influenza A by PCR 03/10/2023 NEGATIVE  NEGATIVE Final   Influenza B by PCR 03/10/2023 NEGATIVE  NEGATIVE Final   Comment: (NOTE) The Xpert Xpress SARS-CoV-2/FLU/RSV plus assay is intended as an aid in the diagnosis of influenza from Nasopharyngeal swab specimens and should not be used as a sole basis for treatment. Nasal washings and aspirates are unacceptable for Xpert Xpress SARS-CoV-2/FLU/RSV testing.  Fact Sheet for Patients: bloggercourse.com  Fact Sheet for Healthcare  Providers: seriousbroker.it  This test is not yet approved or cleared by the [place]  FDA and has been authorized for detection and/or diagnosis of SARS-CoV-2 by FDA under an Emergency Use Authorization (EUA). This EUA will remain in effect (meaning this test can be used) for the duration of the COVID-19 declaration under Section 564(b)(1) of the Act, 21 U.S.C. section 360bbb-3(b)(1), unless the authorization is terminated or revoked.     Resp Syncytial Virus by PCR 03/10/2023 NEGATIVE  NEGATIVE Final   Comment: (NOTE) Fact Sheet for Patients: bloggercourse.com  Fact Sheet for Healthcare Providers: seriousbroker.it  This test is not yet approved or cleared by the [place]  FDA and has been authorized for detection and/or diagnosis of SARS-CoV-2 by FDA under an Emergency Use Authorization (EUA). This EUA will remain in effect (meaning this test can be used) for the duration of the COVID-19 declaration under Section 564(b)(1) of the Act, 21 U.S.C. section 360bbb-3(b)(1), unless the authorization is terminated or revoked.  Performed at [facility], [address], [place], [place] [address]    Troponin I (High Sensitivity) 03/10/2023 3  <18 ng/L Final   Comment: (NOTE) Elevated high sensitivity troponin I (hsTnI) values and significant  changes across serial measurements may suggest ACS but many other  chronic and acute conditions are known to elevate hsTnI results.  Refer to the Links section for chest pain algorithms and additional  guidance. Performed at [facility], [address] [place], [place] [address]   Admission on  03/08/2023, Discharged on 03/08/2023  Component Date Value Ref Range Status   Sodium 03/08/2023 137  135 - 145 mmol/L Final   Potassium 03/08/2023 3.1 (L)  3.5 - 5.1 mmol/L Final   Chloride 03/08/2023 101  98 - 111 mmol/L Final   CO2 03/08/2023 24  22 -  32 mmol/L Final   Glucose, Bld 03/08/2023 127 (H)  70 - 99 mg/dL Final   Glucose reference range applies only to samples taken after fasting for at least 8 hours.   BUN 03/08/2023 14  6 - 20 mg/dL Final   Creatinine, Ser 03/08/2023 0.91  0.44 - 1.00 mg/dL Final   Calcium 98/71/7974 9.0  8.9 - 10.3 mg/dL Final   GFR, Estimated 03/08/2023 >60  >60 mL/min Final   Comment: (NOTE) Calculated using the CKD-EPI Creatinine Equation (2021)    Anion gap 03/08/2023 12  5 - 15 Final   Performed at [facility], [address] [place], [place] [address]   WBC 03/08/2023 7.6  4.0 - 10.5 K/uL Final   RBC 03/08/2023 4.98  3.87 - 5.11 MIL/uL Final   Hemoglobin 03/08/2023 12.2  12.0 - 15.0 g/dL Final   HCT 98/71/7974 40.0  36.0 - 46.0 % Final   MCV 03/08/2023 80.3  80.0 - 100.0 fL Final   MCH 03/08/2023 24.5 (L)  26.0 - 34.0 pg Final   MCHC 03/08/2023 30.5  30.0 - 36.0 g/dL Final   RDW 98/71/7974 14.6  11.5 - 15.5 % Final   Platelets 03/08/2023 319  150 - 400 K/uL Final   nRBC 03/08/2023 0.0  0.0 - 0.2 % Final   Performed at [facility], [address] [place], [place] [address]   Troponin I (High Sensitivity) 03/08/2023 3  <18 ng/L Final   Comment: (NOTE) Elevated high sensitivity troponin I (hsTnI) values and significant  changes across serial measurements may suggest ACS but many other  chronic and acute conditions are known to elevate hsTnI results.  Refer to the Links section for chest pain algorithms and additional  guidance. Performed at [facility], [address] [place], [place] [address]    Preg, Serum 03/08/2023 NEGATIVE  NEGATIVE Final   Comment:        THE SENSITIVITY OF THIS METHODOLOGY IS >10 mIU/mL. Performed at [facility], [address], [place], [place] [address]   Admission on 03/05/2023, Discharged on 03/05/2023  Component Date Value Ref Range Status   Color, UA 03/05/2023 yellow  yellow  Final   Clarity, UA 03/05/2023 clear  clear Final   Glucose, UA 03/05/2023 negative  negative mg/dL Final   Bilirubin, UA 98/74/7974 negative  negative Final   Ketones, POC UA 03/05/2023 negative  negative mg/dL Final   Spec Grav, UA 98/74/7974 1.020  1.010 - 1.025 Final   Blood, UA 03/05/2023 negative  negative Final   pH, UA 03/05/2023 6.0  5.0 - 8.0 Final   Protein Ur, POC 03/05/2023 negative  negative mg/dL Final   Urobilinogen, UA 03/05/2023 0.2  0.2 or 1.0 E.U./dL Final   Nitrite, UA 98/74/7974 Negative  Negative Final   Leukocytes, UA 03/05/2023 Negative  Negative Final  Office Visit on 02/07/2023  Component Date Value Ref Range Status   Glucose 02/07/2023 108 (H)  70 - 99 mg/dL Final   BUN 87/69/7975 13  6 - 24 mg/dL Final   Creatinine, Ser 02/07/2023 0.91  0.57 - 1.00 mg/dL Final   eGFR 87/69/7975 80  >  59 mL/min/1.73 Final   BUN/Creatinine Ratio 02/07/2023 14  9 - 23 Final   Sodium 02/07/2023 139  134 - 144 mmol/L Final   Potassium 02/07/2023 3.5  3.5 - 5.2 mmol/L Final   Chloride 02/07/2023 100  96 - 106 mmol/L Final   CO2 02/07/2023 22  20 - 29 mmol/L Final   Calcium 02/07/2023 9.7  8.7 - 10.2 mg/dL Final  Office Visit on 01/14/2023  Component Date Value Ref Range Status   Preg Test, Ur 01/14/2023 Negative  Negative Final  Admission on 01/08/2023, Discharged on 01/08/2023  Component Date Value Ref Range Status   WBC 01/08/2023 7.2  4.0 - 10.5 K/uL Final   RBC 01/08/2023 4.99  3.87 - 5.11 MIL/uL Final   Hemoglobin 01/08/2023 11.9 (L)  12.0 - 15.0 g/dL Final   HCT 88/69/7975 39.6  36.0 - 46.0 % Final   MCV 01/08/2023 79.4 (L)  80.0 - 100.0 fL Final   MCH 01/08/2023 23.8 (L)  26.0 - 34.0 pg Final   MCHC 01/08/2023 30.1  30.0 - 36.0 g/dL Final   RDW 88/69/7975 15.6 (H)  11.5 - 15.5 % Final   Platelets 01/08/2023 317  150 - 400 K/uL Final   nRBC 01/08/2023 0.0  0.0 - 0.2 % Final   Performed at [facility], [address] [place], [place] [address]   Sodium  01/08/2023 135  135 - 145 mmol/L Final   Potassium 01/08/2023 3.6  3.5 - 5.1 mmol/L Final   Chloride 01/08/2023 103  98 - 111 mmol/L Final   CO2 01/08/2023 24  22 - 32 mmol/L Final   Glucose, Bld 01/08/2023 111 (H)  70 - 99 mg/dL Final   Glucose reference range applies only to samples taken after fasting for at least 8 hours.   BUN 01/08/2023 8  6 - 20 mg/dL Final   Creatinine, Ser 01/08/2023 0.79  0.44 - 1.00 mg/dL Final   Calcium 88/69/7975 9.4  8.9 - 10.3 mg/dL Final   Total Protein 88/69/7975 8.0  6.5 - 8.1 g/dL Final   Albumin 88/69/7975 3.7  3.5 - 5.0 g/dL Final   AST 88/69/7975 14 (L)  15 - 41 U/L Final   ALT 01/08/2023 15  0 - 44 U/L Final   Alkaline Phosphatase 01/08/2023 80  38 - 126 U/L Final   Total Bilirubin 01/08/2023 1.0  <1.2 mg/dL Final   GFR, Estimated 01/08/2023 >60  >60 mL/min Final   Comment: (NOTE) Calculated using the CKD-EPI Creatinine Equation (2021)    Anion gap 01/08/2023 8  5 - 15 Final   Performed at [facility], [address], [place], [place] [address]   Lipase 01/08/2023 27  11 - 51 U/L Final   Performed at [facility], [address] [place], [place] [address]   Troponin I (High Sensitivity) 01/08/2023 3  <18 ng/L Final   Comment: (NOTE) Elevated high sensitivity troponin I (hsTnI) values and significant  changes across serial measurements may suggest ACS but many other  chronic and acute conditions are known to elevate hsTnI results.  Refer to the Links section for chest pain algorithms and additional  guidance. Performed at [facility], [address] [place], [place] [address]   Office Visit on 11/18/2022  Component Date Value Ref Range Status   Preg Test, Ur 11/18/2022 Negative  Negative Final   Source Wet Prep POC 11/18/2022 VAG   Final   WBC, Wet Prep HPF POC  11/18/2022 0-5   Final   Bacteria Wet Prep HPF POC 11/18/2022 Few  Few Final   Clue Cells Wet Prep HPF POC 11/18/2022 None  None Final   Clue Cells Wet Prep  Whiff POC 11/18/2022 Negative Whiff   Final   Yeast Wet Prep HPF POC 11/18/2022 None  None Final   KOH Wet Prep POC 11/18/2022 None  None Final   Trichomonas Wet Prep HPF POC 11/18/2022 Absent  Absent Final  Admission on 09/23/2022, Discharged on 09/23/2022  Component Date Value Ref Range Status   Color, UA 09/23/2022 yellow (A)  yellow Final   Clarity, UA 09/23/2022 clear  clear Final   Glucose, UA 09/23/2022 negative  negative mg/dL Final   Bilirubin, UA 91/84/7975 negative  negative Final   Ketones, POC UA 09/23/2022 negative  negative mg/dL Final   Spec Grav, UA 91/84/7975 >=1.030 (A)  1.010 - 1.025 Final   Blood, UA 09/23/2022 negative  negative Final   pH, UA 09/23/2022 5.5  5.0 - 8.0 Final   Protein Ur, POC 09/23/2022 negative  negative mg/dL Final   Urobilinogen, UA 09/23/2022 0.2  0.2 or 1.0 E.U./dL Final   Nitrite, UA 91/84/7975 Negative  Negative Final   Leukocytes, UA 09/23/2022 Trace (A)  Negative Final   Preg Test, Ur 09/23/2022 Negative  Negative Final   Specimen Description 09/23/2022 URINE, CLEAN CATCH   Final   Special Requests 09/23/2022 NONE   Final   Culture 09/23/2022  (A)   Final                   Value:<10,000 COLONIES/mL INSIGNIFICANT GROWTH Performed at [facility], [address] [place], [place] [address]    Report Status 09/23/2022 09/24/2022 FINAL   Final    Allergies: Patient has no known allergies.  Medications:  Facility Ordered Medications  Medication   alum & mag hydroxide-simeth (MAALOX/MYLANTA) 200-200-20 MG/5ML suspension 30 mL   magnesium  hydroxide (MILK OF MAGNESIA) suspension 30 mL   acetaminophen  (TYLENOL ) tablet 650 mg   hydrOXYzine  (ATARAX ) tablet 25 mg   traZODone  (DESYREL ) tablet 50 mg   PTA Medications  Medication Sig   amLODipine  (NORVASC ) 10 MG tablet TAKE 1 TABLET(10 MG) BY MOUTH DAILY   hydrochlorothiazide  (HYDRODIURIL ) 25 MG tablet Take 1 tablet (25 mg total) by mouth daily. (Patient taking differently: Take 25 mg  by mouth every 12 (twelve) hours.)   sertraline  (ZOLOFT ) 25 MG tablet Take 1 tablet (25 mg total) by mouth daily.   acetaminophen  (TYLENOL ) 500 MG tablet Take 500 mg by mouth every 6 (six) hours as needed for mild pain (pain score 1-3), moderate pain (pain score 4-6), fever or headache.   methocarbamol  (ROBAXIN ) 500 MG tablet Take 1 tablet (500 mg total) by mouth 2 (two) times daily.      Medical Decision Making  Patient presents to [facility] with increased depression, SI, and auditory hallucinations that tell her to kill herself.  She will be recommended for inpatient psychiatric admission.  She will be admitted to the continuous assessment unit while awaiting inpatient bed availability   Recommendations  Based on my evaluation the patient does not appear to have an emergency medical condition.  Patient recommended for inpatient psychiatric admission.  [facility] notified patient under review.   Medications:  Start Abilify  5 mg daily (patient has taken in the past and report it was helpful) Start Zoloft  25 mg daily  Restart home medications:  Amlodipine  10 mg daily  Hydrochlorothiazide  25 mg daily Methocarbamol  500 mg twice daily   Lab Orders         CBC with Differential/Platelet         Comprehensive metabolic panel         Hemoglobin A1c         Magnesium          Ethanol         Lipid panel         TSH         Urinalysis, Complete w Microscopic -Urine, Clean Catch         Vitamin B12         POC urine preg, ED         POCT Urine Drug Screen - (I-Screen)      EKG  Elveria VEAR Batter, NP 03/15/23  9:55 AM

## 2023-03-15 NOTE — ED Notes (Addendum)
 Patient A&O x 4, calm cooperative with clear speech and logical thought process. Patient reports SI with plan to get hit by a car, depression, sadness, anxiousness, and worthlessness.  Patient denies HI, AVH at this time. Patient does endorse that she has hear a female voice telling her she is worthless and kill herself recently. Patient states she has to get herself together because she is to be married in 2 weeks and has 5 children to care for at home. Supportive listening and medications administered. Patient does not appear to be responding to internal stimuli. Patient able to verbally contract for safety.

## 2023-03-15 NOTE — ED Provider Notes (Signed)
 Notified by Nursing staff that patients Potassium level resulted as 2.6.  Patient is not endorsing any nausea, vomiting, diarrhea,muscle cramping, confusion or irritability.   Will order Potassium 40 meq po now  @1830  Will receive another dose of Potassium 40 meq po  Will Recheck K level   If 3> may transfer to [facility] for IP admission.  If no improvement the patient will need transfer to ED for IV potassium replacement.   Nursing staff made aware.

## 2023-03-15 NOTE — Progress Notes (Addendum)
   03/15/23 0743  [facility]UC Triage Screening (Walk-ins at [facility] only)  How Did You Hear About Us ? Legal System  What Is the Reason for Your Visit/Call Today? Kendra Harris presents to [facility] voluntarily via [employer]. Pt states that she is having some crazy thoughts in her head. Pt admits to having SI thoughts this morning without a plan; however is unsure if she wants to harm herself at this time. Pt denies HI, AVH and alcohol/drug use at this time. Pt states that she isn't sure how we can help her at this time. Per [employer], pt shared that she has been depressed for the past 2 weeks. Pt has 2 adult children in the home and was on medication in the past.  How Long Has This Been Causing You Problems? 1 wk - 1 month  Have You Recently Had Any Thoughts About Hurting Yourself? Yes  How long ago did you have thoughts about hurting yourself? this morning - no plan  Are You Planning to Commit Suicide/Harm Yourself At This time?  (pt states that she doesn't know)  Have you Recently Had Thoughts About Hurting Someone [name]? No  Are You Planning To Harm Someone At This Time? No  Physical Abuse Denies  Verbal Abuse Denies  Sexual Abuse Denies  Exploitation of patient/patient's resources Denies  Self-Neglect Denies  Are you currently experiencing any auditory, visual or other hallucinations? No  Have You Used Any Alcohol or Drugs in the Past 24 Hours? No  Do you have any current medical co-morbidities that require immediate attention? Yes  Please describe current medical co-morbidities that require immediate attention: High blood pressure  Clinician description of patient physical appearance/behavior: casually dressed, calm, cooperative  What Do You Feel Would Help You the Most Today?  (pt states she doesn't know)  If access to [facility] was not available, would you have sought care in the Emergency Department? No  Determination of Need Urgent (48 hours)  Options For Referral Inpatient Hospitalization;Medication  Management;Outpatient Therapy  Determination of Need filed? Yes

## 2023-03-15 NOTE — ED Notes (Signed)
 Patient A&Ox4. Patient reports SI with a plan to get hit by a car. Patient denies SI/HI and AVH. Patient denies any physical complaints when asked. No acute distress noted. Support and encouragement provided. Routine safety checks conducted according to facility protocol. Encouraged patient to notify staff if thoughts of harm toward self or others arise. Patient verbalize understanding and agreement. Will continue to monitor for safety.

## 2023-03-15 NOTE — BH Assessment (Signed)
 Comprehensive Clinical Assessment (CCA) Note  03/15/2023 Kendra Harris [id number]  DISPOSITION: Per Kendra Batter NP pt is recommended for Inpatient psychiatric treatment.   The patient demonstrates the following risk factors for suicide: Chronic risk factors for suicide include: psychiatric disorder of MDD and GAD . Acute risk factors for suicide include: unemployment and loss (financial, interpersonal, professional). Protective factors for this patient include: positive social support, responsibility to others (children, family), and hope for the future. Considering these factors, the overall suicide risk at this point appears to be low. Patient is appropriate for outpatient follow up.    Per Triage assessment: "Kendra Harris presents to [facility] voluntarily via [employer]. Pt states that she is having some crazy thoughts in her head. Pt admits to having SI thoughts this morning without a plan; however is unsure if she wants to harm herself at this time. Pt denies HI, AVH and alcohol/drug use at this time. Pt states that she isn't sure how we can help her at this time. Per [employer], pt shared that she has been depressed for the past 2 weeks. Pt has 2 adult children in the home and was on medication in the past."  With further assessment: Pt is a 43 yo female who presented voluntarily and unaccompanied due to worsening depression. Pt stated that no one event occurred to trigger her depression. Pt stated that she was fired from her job at [employer] due to absenteeism related to family issues and within the last two weeks she has developed she has developed sharp pains in her head and shoulder. She stated when she went to the doctor she was told it has due to muscle spasms but she believes that something far worse is happening. Pt stated that these events together have made her suicidal. Pt stated that today she thought about intentionally getting hit by a car to kill herself but she did not attempt. Pt denied  any suicide attempts in the past. Pt stated that she has begun to hear a female voice telling her to kill herself which she says scares her. Pt stated that she has been psychiatrically hospitalized once in 2006 after her grandmother's death when she became severely depressed. At that time she was prescribed medications and was seeing an OP therapist. Since then, years ago, pt stated she stopped both treatments. Pt stated that she has had decreased appetite and difficulties sleeping for about 2 weeks.   Pt stated that she lives with her boyfriend and 5 children (18, 83, 47, 27 and 13 yo). Pt stated that "things at home are good." Pt is currently unemployed and does not receive disability income. Pt stated she stopped school in the 10th grade. Pt denied any current legal issues, access to firearms and any childhood or domestic abuse. Pt stated she is not married.   Pt was tearful, alert, cooperative, pleasant and seemed fully oriented. Pt's mood was depressed and her affect was flat and tearful. Pt's judgment and insight seemed impaired due to SI. Pt was casually dressed and seemed adequately groomed. Pt's speech and movement were within normal ranges.    Chief Complaint:  Chief Complaint  Patient presents with   Depression   Visit Diagnosis:  MDD, Recurrent, Severe GAD    CCA Screening, Triage and Referral (STR)  Patient Reported Information How did you hear about us ? Legal System  What Is the Reason for Your Visit/Call Today? Kendra Harris presents to [facility] voluntarily via [employer]. Pt states that she is having some crazy  thoughts in her head. Pt admits to having SI thoughts this morning without a plan; however is unsure if she wants to harm herself at this time. Pt denies HI, AVH and alcohol/drug use at this time. Pt states that she isn't sure how we can help her at this time. Per [employer], pt shared that she has been depressed for the past 2 weeks. Pt has 2 adult children in the home and was on  medication in the past.  How Long Has This Been Causing You Problems? 1 wk - 1 month  What Do You Feel Would Help You the Most Today? -- (pt states she doesn't know)   Have You Recently Had Any Thoughts About Hurting Yourself? Yes  Are You Planning to Commit Suicide/Harm Yourself At This time? -- (pt states that she doesn't know)   Flowsheet Row ED from 03/15/2023 in [facility] ED from 03/12/2023 in [facility] at [facility] ED from 03/10/2023 in [facility] Emergency Department at [facility]  C-SSRS RISK CATEGORY Low Risk No Risk No Risk       Have you Recently Had Thoughts About Hurting Someone Kendra Harris? No  Are You Planning to Harm Someone at This Time? No  Explanation: na  Have You Used Any Alcohol or Drugs in the Past 24 Hours? No  How Long Ago Did You Use Drugs or Alcohol? na What Did You Use and How Much? na  Do You Currently Have a Therapist/Psychiatrist? No  Name of Therapist/Psychiatrist:    Have You Been Recently Discharged From Any Office Practice or Programs? No  Explanation of Discharge From Practice/Program: na    CCA Screening Triage Referral Assessment Type of Contact: Face-to-Face  Telemedicine Service Delivery:   Is this Initial or Reassessment?   Date Telepsych consult ordered in [facility]:    Time Telepsych consult ordered in [facility]:    Location of Assessment: [facility] Services  Provider Location: [facility] Assessment Services   Collateral Involvement: none   Does Patient Have a Automotive Engineer Guardian? No  Legal Guardian Contact Information: adult  Copy of Legal Guardianship Form: No - copy requested  Legal Guardian Notified of Arrival: -- (na)  Legal Guardian Notified of Pending Discharge: -- (na)  If Minor and Not Living with Parent(s), Who has Custody? adult  Is CPS involved or ever been involved? -- (none reported)  Is APS involved or ever been involved? -- (none  reported)   Patient Determined To Be At Risk for Harm To Self or Others Based on Review of Patient Reported Information or Presenting Complaint? Yes, for Self-Harm  Method: Plan with intent and identified person  Availability of Means: Has close by  Intent: Clearly intends on inflicting harm that could cause death  Notification Required: No need or identified person  Additional Information for Danger to Others Potential: na Additional Comments for Danger to Others Potential: none  Are There Guns or Other Weapons in Your Home? No  Types of Guns/Weapons: na  Are These Weapons Safely Secured?                            -- (na)  Who Could Verify You Are Able To Have These Secured: na  Do You Have any Outstanding Charges, Pending Court Dates, Parole/Probation? none-denied  Contacted To Inform of Risk of Harm To Self or Others: Other: Comment (na)    Does Patient Present under  Involuntary Commitment? No    [place] of Residence: [place]   Patient Currently Receiving the Following Services: Not Receiving Services   Determination of Need: Emergent (2 hours) (Per Kendra Batter NP pt is recommended for Inpatient psychiatric treatment.)   Options For Referral: Inpatient Hospitalization     CCA Biopsychosocial Patient Reported Schizophrenia/Schizoaffective Diagnosis in Past: No   Strengths: ablew to ask for and accept help   Mental Health Symptoms Depression:  Change in energy/activity; Difficulty Concentrating; Fatigue; Hopelessness; Increase/decrease in appetite; Irritability; Sleep (too much or little); Tearfulness; Weight gain/loss; Worthlessness   Duration of Depressive symptoms: Duration of Depressive Symptoms: Greater than two weeks   Mania:  Change in energy/activity; Irritability   Anxiety:   Difficulty concentrating; Fatigue; Irritability; Restlessness; Sleep; Worrying   Psychosis:  None   Duration of Psychotic symptoms:    Trauma:  None    Obsessions:  None   Compulsions:  None   Inattention:  None   Hyperactivity/Impulsivity:  None   Oppositional/Defiant Behaviors:  None   Emotional Irregularity:  None   Other Mood/Personality Symptoms:  none observed    Mental Status Exam Appearance and self-care  Stature:  Average   Weight:  Obese   Clothing:  Casual; Neat/clean   Grooming:  Normal   Cosmetic use:  Age appropriate   Posture/gait:  Normal   Motor activity:  Not Remarkable   Sensorium  Attention:  Normal   Concentration:  Normal   Orientation:  X5   Recall/memory:  Normal   Affect and Mood  Affect:  Depressed; Flat   Mood:  Depressed; Dysphoric   Relating  Eye contact:  Normal   Facial expression:  Depressed   Attitude toward examiner:  Cooperative   Thought and Language  Speech flow: Clear and Coherent; Paucity   Thought content:  Appropriate to Mood and Circumstances   Preoccupation:  Other (Comment) (pain in her haed and shoulder)   Hallucinations:  Auditory; Command (Comment) (She stated a voice tells her to kill herself.)   Organization:  Coherent   [employer] of Knowledge:  Average   Intelligence:  Average   Abstraction:  Functional   Judgement:  Impaired   Reality Testing:  Distorted   Insight:  Lacking   Decision Making:  Vacilates   Social Functioning  Social Maturity:  Responsible   Social Judgement:  Normal   Stress  Stressors:  Surveyor, Quantity; Work   Coping Ability:  Deficient supports; Exhausted; Overwhelmed   Skill Deficits:  Self-care   Supports:  Family; Friends/Service system; Support needed     Religion: Religion/Spirituality Are You A Religious Person?: Yes What is Your Religious Affiliation?: Baptist How Might This Affect Treatment?: unknown  Leisure/Recreation: Leisure / Recreation Do You Have Hobbies?: No  Exercise/Diet: Exercise/Diet Do You Exercise?: Yes What Type of Exercise Do You Do?: Run/Walk How Many  Times a Week Do You Exercise?: 4-5 times a week Have You Gained or Lost A Significant Amount of Weight in the Past Six Months?: Yes-Lost Number of Pounds Lost?: 14 (in a few weeks) Do You Follow a Special Diet?: No Do You Have Any Trouble Sleeping?: Yes Explanation of Sleeping Difficulties: Pt stated that she has had decreased appetite and difficulties sleeping for about 2 weeks.   CCA Employment/Education Employment/Work Situation: Employment / Work Situation Employment Situation: Unemployed Patient's Job has Been Impacted by Current Illness: Yes Describe how Patient's Job has Been Impacted: Absenteeism Has Patient ever Been in Equities Trader?: No  Education:  Education Is Patient Currently Attending School?: No Last Grade Completed: 9 Did You Attend College?: No Did You Have An Individualized Education Program (IIEP): No Did You Have Any Difficulty At School?: Yes Were Any Medications Ever Prescribed For These Difficulties?: No Patient's Education Has Been Impacted by Current Illness: No   CCA Family/Childhood History Family and Relationship History: Family history Marital status: Single Does patient have children?: Yes How many children?: 5 How is patient's relationship with their children?: good relationships  Childhood History:  Childhood History By whom was/is the patient raised?: Grandparents Did patient suffer any verbal/emotional/physical/sexual abuse as a child?: No Has patient ever been sexually abused/assaulted/raped as an adolescent or adult?: No Witnessed domestic violence?: No Has patient been affected by domestic violence as an adult?: No       CCA Substance Use Alcohol/Drug Use: Alcohol / Drug Use Pain Medications: Denies use Prescriptions: Denies use Over the Counter: Denies use History of alcohol / drug use?: No history of alcohol / drug abuse Longest period of sobriety (when/how long): NA                         ASAM's:  Six  Dimensions of Multidimensional Assessment  Dimension 1:  Acute Intoxication and/or Withdrawal Potential:      Dimension 2:  Biomedical Conditions and Complications:      Dimension 3:  Emotional, Behavioral, or Cognitive Conditions and Complications:     Dimension 4:  Readiness to Change:     Dimension 5:  Relapse, Continued use, or Continued Problem Potential:     Dimension 6:  Recovery/Living Environment:     ASAM Severity Score:    ASAM Recommended Level of Treatment:     Substance use Disorder (SUD)    Recommendations for Services/Supports/Treatments:    Disposition Recommendation per psychiatric provider: We recommend inpatient psychiatric hospitalization when medically cleared. Patient is under voluntary admission status at this time; please IVC if attempts to leave hospital.   DSM5 Diagnoses: Patient Active Problem List   Diagnosis Date Noted   Systolic murmur 01/14/2023   Desire for pregnancy 01/14/2023   Epidermoid cyst of skin of chest 04/29/2022   Trapezius muscle strain 02/07/2022   GERD (gastroesophageal reflux disease) 05/07/2021   Tension headache 05/07/2021   Anemia 04/11/2019   Menorrhagia 04/27/2013   Anxiety and depression 07/24/2010   Back pain 02/11/2010   Essential hypertension, benign 08/31/2007   Morbid obesity with BMI of 45.0-49.9, adult (HCC) 04/07/2006     Referrals to Alternative Service(s): Referred to Alternative Service(s):   Place:   Date:   Time:    Referred to Alternative Service(s):   Place:   Date:   Time:    Referred to Alternative Service(s):   Place:   Date:   Time:    Referred to Alternative Service(s):   Place:   Date:   Time:     Miriah Maruyama T, Counselor

## 2023-03-16 ENCOUNTER — Encounter: Payer: Self-pay | Admitting: Psychiatry

## 2023-03-16 ENCOUNTER — Other Ambulatory Visit: Payer: Self-pay

## 2023-03-16 ENCOUNTER — Inpatient Hospital Stay
Admission: AD | Admit: 2023-03-16 | Discharge: 2023-03-20 | DRG: 885 | Disposition: A | Discharge status: Home / Self Care | Payer: MEDICAID | Source: Intra-hospital | Attending: Psychiatry | Admitting: Psychiatry

## 2023-03-16 DIAGNOSIS — Z6837 Body mass index (BMI) 37.0-37.9, adult: Secondary | ICD-10-CM | POA: Diagnosis not present

## 2023-03-16 DIAGNOSIS — G4700 Insomnia, unspecified: Secondary | ICD-10-CM | POA: Diagnosis present

## 2023-03-16 DIAGNOSIS — E876 Hypokalemia: Secondary | ICD-10-CM | POA: Diagnosis present

## 2023-03-16 DIAGNOSIS — Z832 Family history of diseases of the blood and blood-forming organs and certain disorders involving the immune mechanism: Secondary | ICD-10-CM

## 2023-03-16 DIAGNOSIS — I10 Essential (primary) hypertension: Secondary | ICD-10-CM | POA: Diagnosis present

## 2023-03-16 DIAGNOSIS — G894 Chronic pain syndrome: Secondary | ICD-10-CM | POA: Diagnosis present

## 2023-03-16 DIAGNOSIS — R45851 Suicidal ideations: Secondary | ICD-10-CM | POA: Diagnosis present

## 2023-03-16 DIAGNOSIS — F322 Major depressive disorder, single episode, severe without psychotic features: Secondary | ICD-10-CM | POA: Diagnosis present

## 2023-03-16 DIAGNOSIS — Z79899 Other long term (current) drug therapy: Secondary | ICD-10-CM

## 2023-03-16 DIAGNOSIS — R634 Abnormal weight loss: Secondary | ICD-10-CM | POA: Diagnosis present

## 2023-03-16 DIAGNOSIS — F333 Major depressive disorder, recurrent, severe with psychotic symptoms: Principal | ICD-10-CM

## 2023-03-16 DIAGNOSIS — F419 Anxiety disorder, unspecified: Secondary | ICD-10-CM | POA: Diagnosis present

## 2023-03-16 LAB — GLUCOSE, CAPILLARY: Glucose-Capillary: 118 mg/dL — ABNORMAL HIGH (ref 70–99)

## 2023-03-16 MED ORDER — HALOPERIDOL LACTATE 5 MG/ML IJ SOLN: 5.0000 mg | Freq: Three times a day (TID) | INTRAMUSCULAR | Status: DC | PRN

## 2023-03-16 MED ORDER — POTASSIUM CHLORIDE CRYS ER 20 MEQ PO TBCR
40.0000 meq | EXTENDED_RELEASE_TABLET | Freq: Once | ORAL | Status: AC
  Administered 2023-03-16: 40 meq via ORAL
  Filled 2023-03-16: qty 2

## 2023-03-16 MED ORDER — ALUM & MAG HYDROXIDE-SIMETH 200-200-20 MG/5ML PO SUSP: 30.0000 mL | ORAL | Status: DC | PRN

## 2023-03-16 MED ORDER — IBUPROFEN 200 MG PO TABS: 400.0000 mg | ORAL_TABLET | Freq: Four times a day (QID) | ORAL | Status: DC | PRN

## 2023-03-16 MED ORDER — BUTALBITAL-APAP-CAFFEINE 50-325-40 MG PO TABS: 1.0000 | ORAL_TABLET | Freq: Four times a day (QID) | ORAL | Status: DC | PRN

## 2023-03-16 MED ORDER — LORAZEPAM 2 MG/ML IJ SOLN: 2.0000 mg | Freq: Three times a day (TID) | INTRAMUSCULAR | Status: DC | PRN

## 2023-03-16 MED ORDER — HYDROXYZINE HCL 25 MG PO TABS
25.0000 mg | ORAL_TABLET | Freq: Three times a day (TID) | ORAL | Status: DC | PRN
  Administered 2023-03-16 – 2023-03-19 (×7): 25 mg via ORAL
  Filled 2023-03-16 (×7): qty 1

## 2023-03-16 MED ORDER — HYDROCHLOROTHIAZIDE 25 MG PO TABS
25.0000 mg | ORAL_TABLET | Freq: Every day | ORAL | Status: DC
Start: 2023-03-16 — End: 2023-03-20
  Administered 2023-03-16 – 2023-03-20 (×5): 25 mg via ORAL
  Filled 2023-03-16 (×5): qty 1

## 2023-03-16 MED ORDER — DIPHENHYDRAMINE HCL 25 MG PO CAPS
50.0000 mg | ORAL_CAPSULE | Freq: Three times a day (TID) | ORAL | Status: DC | PRN
  Administered 2023-03-16 – 2023-03-18 (×2): 50 mg via ORAL
  Filled 2023-03-16 (×2): qty 2

## 2023-03-16 MED ORDER — TRAZODONE HCL 50 MG PO TABS
50.0000 mg | ORAL_TABLET | Freq: Every evening | ORAL | Status: DC | PRN
  Administered 2023-03-16 – 2023-03-19 (×4): 50 mg via ORAL
  Filled 2023-03-16 (×4): qty 1

## 2023-03-16 MED ORDER — ARIPIPRAZOLE 5 MG PO TABS
5.0000 mg | ORAL_TABLET | Freq: Every day | ORAL | Status: DC
  Administered 2023-03-16 – 2023-03-20 (×5): 5 mg via ORAL
  Filled 2023-03-16 (×5): qty 1

## 2023-03-16 MED ORDER — DIPHENHYDRAMINE HCL 50 MG/ML IJ SOLN: 50.0000 mg | Freq: Three times a day (TID) | INTRAMUSCULAR | Status: DC | PRN

## 2023-03-16 MED ORDER — MAGNESIUM HYDROXIDE 400 MG/5ML PO SUSP: 30.0000 mL | Freq: Every day | ORAL | Status: DC | PRN

## 2023-03-16 MED ORDER — METHOCARBAMOL 500 MG PO TABS
500.0000 mg | ORAL_TABLET | Freq: Two times a day (BID) | ORAL | Status: DC
  Administered 2023-03-16 – 2023-03-20 (×8): 500 mg via ORAL
  Filled 2023-03-16 (×9): qty 1

## 2023-03-16 MED ORDER — HALOPERIDOL LACTATE 5 MG/ML IJ SOLN: 10.0000 mg | Freq: Three times a day (TID) | INTRAMUSCULAR | Status: DC | PRN

## 2023-03-16 MED ORDER — AMLODIPINE BESYLATE 5 MG PO TABS
10.0000 mg | ORAL_TABLET | Freq: Every day | ORAL | Status: DC
  Administered 2023-03-16 – 2023-03-20 (×5): 10 mg via ORAL
  Filled 2023-03-16 (×5): qty 2

## 2023-03-16 MED ORDER — DULOXETINE HCL 20 MG PO CPEP
20.0000 mg | ORAL_CAPSULE | Freq: Every day | ORAL | Status: DC
  Administered 2023-03-17 – 2023-03-20 (×4): 20 mg via ORAL
  Filled 2023-03-16 (×4): qty 1

## 2023-03-16 MED ORDER — SERTRALINE HCL 25 MG PO TABS
25.0000 mg | ORAL_TABLET | Freq: Every day | ORAL | Status: DC
  Administered 2023-03-16: 25 mg via ORAL
  Filled 2023-03-16: qty 1

## 2023-03-16 MED ORDER — LORAZEPAM 1 MG PO TABS
1.0000 mg | ORAL_TABLET | Freq: Once | ORAL | Status: AC
  Administered 2023-03-16: 1 mg via ORAL
  Filled 2023-03-16: qty 1

## 2023-03-16 MED ORDER — HALOPERIDOL 5 MG PO TABS
5.0000 mg | ORAL_TABLET | Freq: Three times a day (TID) | ORAL | Status: DC | PRN
  Administered 2023-03-16: 5 mg via ORAL
  Filled 2023-03-16: qty 1

## 2023-03-16 MED ORDER — ACETAMINOPHEN 325 MG PO TABS
650.0000 mg | ORAL_TABLET | Freq: Four times a day (QID) | ORAL | Status: DC | PRN
  Administered 2023-03-16 – 2023-03-20 (×4): 650 mg via ORAL
  Filled 2023-03-16 (×4): qty 2

## 2023-03-16 NOTE — Progress Notes (Signed)
 Pt admitted from [facility], report received from [place], LPN. Pt calm and pleasant during assessment. Pt stated that 2 weeks ago she was having some tightness in her neck and shoulders and pain that went up to her head. Pt stated ever since then she has thought something else is wrong with her causing her depression. Pt denies SI/HI/AVH. Pt given education, support, and encouragement to be active in her treatment plan. Pt being monitored Q 15 minutes for safety per unit protocol, remains safe on the unit

## 2023-03-16 NOTE — Progress Notes (Addendum)
   03/16/23 2045  Psych Admission Type (Psych Patients Only)  Admission Status Voluntary  Psychosocial Assessment  Patient Complaints Sleep disturbance;Anxiety  Eye Contact Fair  Facial Expression Anxious  Affect Anxious  Speech Logical/coherent  Interaction Assertive  Motor Activity Slow  Appearance/Hygiene Unremarkable  Behavior Characteristics Cooperative;Appropriate to situation  Mood Anxious;Pleasant  Thought Process  Coherency WDL  Content WDL  Delusions None reported or observed  Perception Hallucinations  Hallucination Auditory (voices tell her that she is going to die)  Judgment WDL  Confusion None  Danger to Self  Current suicidal ideation? Denies  Danger to Others  Danger to Others None reported or observed   Progress note   D: Pt seen in dayroom during group. Pt denies SI, HI, VH. Endorses AH that tell her she is going to die. Pt states she usually ignores the voices but is having a harder time doing that today. Denies any medical evidence that she is dying. Knows what the voices say are not true.  Pt rates pain  0/10. Pt endorses anxiety and trouble sleeping. No other concerns noted at this time.  A: Pt provided support and encouragement. Pt given scheduled medication as prescribed. PRNs as appropriate. Q15 min checks for safety.   R: Pt safe on the unit. Will continue to monitor.

## 2023-03-16 NOTE — H&P (Addendum)
 Psychiatric Admission Assessment Child/Adolescent  Patient Identification: Kendra Harris MRN:  [id number] Date of Evaluation:  03/16/2023 Chief Complaint:  MDD (major depressive disorder), severe (HCC) [F32.2] Principal Diagnosis: Severe recurrent depression with psychosis (HCC) Diagnosis:  Principal Problem:   Severe recurrent depression with psychosis (HCC) Active Problems:   Essential hypertension, benign  History of Present Illness: 43 year old African American female with a history of major depressive disorder, including a prior inpatient psychiatric admission at [facility] in 2020. She is currently not engaged with any outpatient mental health providers and is not taking any psychiatric medications. She resides with her boyfriend and five children, aged 7, 106, 99, 35, and 25, and is currently unemployed. The patient denies any substance use.Approximately two weeks ago, she began experiencing a resurgence of depressive symptoms without any identifiable stressor or trigger. These symptoms include.She reports engaging in aimless walking, stating, I do not even know where I am going; I am just walking. Additionally, she has been experiencing auditory hallucinations, hearing a female voice that tells her, I am worthless, I mean nothing, and to kill myself. She has considered the possibility of being hit by a car as a means to end her life and is unable to verbally contract for safety. She denies access to firearms or other weapons, denies homicidal ideations, and denies visual hallucinations.The patient agrees to an inpatient psychiatric admission for further evaluation and treatment.Notably, she was recently seen at [facility] for right-sided trapezius pain, posterior tension headache, thigh and leg tightness, and numbness in her feet. She was prescribed methocarbamol  and sertraline  at that time but reports that she did not start taking the sertraline . She  has taken the methocarbamol  for pain, which has been somewhat helpful. She continues to endorse some upper right trapezius/shoulder pain, which at times radiates into the neck area. Associated Signs/Symptoms: Depression Symptoms:  insomnia, fatigue, feelings of worthlessness/guilt, difficulty concentrating, hopelessness, impaired memory, suicidal thoughts with specific plan, anxiety, loss of energy/fatigue, disturbed sleep, weight loss, (Hypo) Manic Symptoms:  Impulsivity, Anxiety Symptoms:  Excessive Worry, Psychotic Symptoms:  Hallucinations: Auditory Duration of Psychotic Symptoms: Less than six months  PTSD Symptoms: Negative Total Time spent with patient: 2.5 hours  Past Psychiatric History: MDD  Is the patient at risk to self? Yes.    Has the patient been a risk to self in the past 6 months? Yes.    Has the patient been a risk to self within the distant past? No.  Is the patient a risk to others? No.  Has the patient been a risk to others in the past 6 months? No.  Has the patient been a risk to others within the distant past? No.   Columbia Scale:  Flowsheet Row Admission (Current) from 03/16/2023 in [facility]AL MEDICINE ED from 03/15/2023 in [facility] ED from 03/12/2023 in [facility] RISK CATEGORY No Risk Moderate Risk No Risk       Prior Inpatient Therapy: Yes.   If yes, describe 2020  Prior Outpatient Therapy: No. If yes, describe not since 2020   Alcohol Screening: 1. How often do you have a drink containing alcohol?: Never 2. How many drinks containing alcohol do you have on a typical day when you are drinking?: 1 or 2 3. How often do you have six or more drinks on one occasion?: Never AUDIT-C Score: 0 4. How often during the last year have you found that you  were not able to stop drinking once you had started?: Never 5. How often during the last year have you failed to do what was  normally expected from you because of drinking?: Never 6. How often during the last year have you needed a first drink in the morning to get yourself going after a heavy drinking session?: Never 7. How often during the last year have you had a feeling of guilt of remorse after drinking?: Never 8. How often during the last year have you been unable to remember what happened the night before because you had been drinking?: Never 9. Have you or someone else been injured as a result of your drinking?: No 10. Has a relative or friend or a doctor or another health worker been concerned about your drinking or suggested you cut down?: No Alcohol Use Disorder Identification Test Final Score (AUDIT): 0 Alcohol Brief Interventions/Follow-up: Alcohol education/Brief advice Substance Abuse History in the last 12 months:  No. Consequences of Substance Abuse: Negative Previous Psychotropic Medications: Yes  Psychological Evaluations: No  Past Medical History:  Past Medical History:  Diagnosis Date   DELAYED MENSES 02/11/2010   Qualifier: Diagnosis of  By: Reynaldo  MD, Khary     Hypertension    MIGRAINE HEADACHE 12/13/2007   Urinary tract infection 03/04/2011    Past Surgical History:  Procedure Laterality Date   NO PAST SURGERIES     Family History:  Family History  Problem Relation Age of Onset   Healthy Mother    Sickle cell trait Sister    Family Psychiatric  History: none reported Tobacco Screening:  Social History   Tobacco Use  Smoking Status Never   Passive exposure: Never  Smokeless Tobacco Never    BH Tobacco Counseling     Are you interested in Tobacco Cessation Medications?  N/A, patient does not use tobacco products Counseled patient on smoking cessation:  N/A, patient does not use tobacco products Reason Tobacco Screening Not Completed: No value filed.       Social History:  Social History   Substance and Sexual Activity  Alcohol Use No     Social History    Substance and Sexual Activity  Drug Use No    Social History   Socioeconomic History   Marital status: Single    Spouse name: Not on file   Number of children: Not on file   Years of education: Not on file   Highest education level: Not on file  Occupational History   Not on file  Tobacco Use   Smoking status: Never    Passive exposure: Never   Smokeless tobacco: Never  Vaping Use   Vaping status: Never Used  Substance and Sexual Activity   Alcohol use: No   Drug use: No   Sexual activity: Yes    Birth control/protection: Condom  Other Topics Concern   Not on file  Social History Narrative   Not on file   Social Drivers of Health   Financial Resource Strain: Not on file  Food Insecurity: No Food Insecurity (03/16/2023)   Hunger Vital Sign    Worried About Running Out of Food in the Last Year: Never true    Ran Out of Food in the Last Year: Never true  Transportation Needs: No Transportation Needs (03/16/2023)   PRAPARE - Administrator, Civil Service (Medical): No    Lack of Transportation (Non-Medical): No  Physical Activity: Not on file  Stress: Not on file  Social Connections: Not on file   Additional Social History:                          Developmental History: Prenatal History: Birth History: Postnatal Infancy: Developmental History: Milestones: Sit-Up: Crawl: Walk: Speech: School History:  Education Status Highest grade of school patient has completed: 10th. Legal History: Hobbies/Interests:Allergies:  No Known Allergies  Lab Results:  Results for orders placed or performed during the hospital encounter of 03/15/23 (from the past 48 hours)  Urinalysis, Complete w Microscopic -Urine, Clean Catch     Status: Abnormal   Collection Time: 03/15/23 10:00 AM  Result Value Ref Range   Color, Urine YELLOW YELLOW   APPearance HAZY (A) CLEAR   Specific Gravity, Urine 1.019 1.005 - 1.030   pH 6.0 5.0 - 8.0   Glucose, UA  NEGATIVE NEGATIVE mg/dL   Hgb urine dipstick LARGE (A) NEGATIVE   Bilirubin Urine NEGATIVE NEGATIVE   Ketones, ur NEGATIVE NEGATIVE mg/dL   Protein, ur NEGATIVE NEGATIVE mg/dL   Nitrite NEGATIVE NEGATIVE   Leukocytes,Ua NEGATIVE NEGATIVE   RBC / HPF 0-5 0 - 5 RBC/hpf   WBC, UA 0-5 0 - 5 WBC/hpf   Bacteria, UA NONE SEEN NONE SEEN   Squamous Epithelial / HPF 0-5 0 - 5 /HPF   Mucus PRESENT    Ca Oxalate Crys, UA PRESENT     Comment: Performed at [facility], [address] [place], [place] [address]  POC urine preg, ED     Status: Normal   Collection Time: 03/15/23 10:03 AM  Result Value Ref Range   Preg Test, Ur Negative Negative  POCT Urine Drug Screen - (I-Screen)     Status: Normal   Collection Time: 03/15/23 10:03 AM  Result Value Ref Range   POC Amphetamine UR None Detected NONE DETECTED (Cut Off Level 1000 ng/mL)   POC Secobarbital (BAR) None Detected NONE DETECTED (Cut Off Level 300 ng/mL)   POC Buprenorphine (BUP) None Detected NONE DETECTED (Cut Off Level 10 ng/mL)   POC Oxazepam (BZO) None Detected NONE DETECTED (Cut Off Level 300 ng/mL)   POC Cocaine UR None Detected NONE DETECTED (Cut Off Level 300 ng/mL)   POC Methamphetamine UR None Detected NONE DETECTED (Cut Off Level 1000 ng/mL)   POC Morphine None Detected NONE DETECTED (Cut Off Level 300 ng/mL)   POC Methadone UR None Detected NONE DETECTED (Cut Off Level 300 ng/mL)   POC Oxycodone  UR None Detected NONE DETECTED (Cut Off Level 100 ng/mL)   POC Marijuana UR None Detected NONE DETECTED (Cut Off Level 50 ng/mL)  CBC with Differential/Platelet     Status: Abnormal   Collection Time: 03/15/23 10:05 AM  Result Value Ref Range   WBC 7.0 4.0 - 10.5 K/uL   RBC 4.93 3.87 - 5.11 MIL/uL   Hemoglobin 12.1 12.0 - 15.0 g/dL   HCT 59.7 63.9 - 53.9 %   MCV 81.5 80.0 - 100.0 fL   MCH 24.5 (L) 26.0 - 34.0 pg   MCHC 30.1 30.0 - 36.0 g/dL   RDW 85.4 88.4 - 84.4 %   Platelets 330 150 - 400 K/uL   nRBC 0.0 0.0 - 0.2 %    Neutrophils Relative % 75 %   Neutro Abs 5.2 1.7 - 7.7 K/uL   Lymphocytes Relative 17 %   Lymphs Abs 1.2 0.7 - 4.0 K/uL   Monocytes Relative 7 %   Monocytes Absolute 0.5 0.1 -  1.0 K/uL   Eosinophils Relative 0 %   Eosinophils Absolute 0.0 0.0 - 0.5 K/uL   Basophils Relative 1 %   Basophils Absolute 0.0 0.0 - 0.1 K/uL   Immature Granulocytes 0 %   Abs Immature Granulocytes 0.01 0.00 - 0.07 K/uL    Comment: Performed at [facility], [address] [place], [place] [address]  Comprehensive metabolic panel     Status: Abnormal   Collection Time: 03/15/23 10:05 AM  Result Value Ref Range   Sodium 138 135 - 145 mmol/L   Potassium 2.6 (LL) 3.5 - 5.1 mmol/L    Comment: CRITICAL RESULT CALLED TO, READ BACK BY AND VERIFIED WITH APRIL COLEMAN RN @1330  02.04.2025 E.AHMED   Chloride 99 98 - 111 mmol/L   CO2 27 22 - 32 mmol/L   Glucose, Bld 117 (H) 70 - 99 mg/dL    Comment: Glucose reference range applies only to samples taken after fasting for at least 8 hours.   BUN 7 6 - 20 mg/dL   Creatinine, Ser 9.13 0.44 - 1.00 mg/dL   Calcium 9.5 8.9 - 89.6 mg/dL   Total Protein 9.0 (H) 6.5 - 8.1 g/dL   Albumin 4.0 3.5 - 5.0 g/dL   AST 17 15 - 41 U/L   ALT 15 0 - 44 U/L   Alkaline Phosphatase 73 38 - 126 U/L   Total Bilirubin 0.3 0.0 - 1.2 mg/dL   GFR, Estimated >39 >39 mL/min    Comment: (NOTE) Calculated using the CKD-EPI Creatinine Equation (2021)    Anion gap 12 5 - 15    Comment: Performed at [facility], [address] [place], [place] [address]  Hemoglobin A1c     Status: Abnormal   Collection Time: 03/15/23 10:05 AM  Result Value Ref Range   Hgb A1c MFr Bld 6.1 (H) 4.8 - 5.6 %    Comment: (NOTE) Pre diabetes:          5.7%-6.4%  Diabetes:              >6.4%  Glycemic control for   <7.0% adults with diabetes    Mean Plasma Glucose 128.37 mg/dL    Comment: Performed at [facility], [address] [place], [place] [address]  Magnesium      Status: None    Collection Time: 03/15/23 10:05 AM  Result Value Ref Range   Magnesium  2.0 1.7 - 2.4 mg/dL    Comment: Performed at [facility], [address], [place], [place] [address]  Ethanol     Status: None   Collection Time: 03/15/23 10:05 AM  Result Value Ref Range   Alcohol, Ethyl (B) <10 <10 mg/dL    Comment: (NOTE) Lowest detectable limit for serum alcohol is 10 mg/dL.  For medical purposes only. Performed at [facility], [address], [place], [place] [address]   TSH     Status: None   Collection Time: 03/15/23 10:05 AM  Result Value Ref Range   TSH 0.803 0.350 - 4.500 uIU/mL    Comment: Performed by a 3rd Generation assay with a functional sensitivity of <=0.01 uIU/mL. Performed at [facility], [address] [place], [place] [address]   Vitamin B12     Status: None   Collection Time: 03/15/23 10:05 AM  Result Value Ref Range   Vitamin B-12 535 180 - 914 pg/mL    Comment: (NOTE) This assay is not validated for testing  neonatal or myeloproliferative syndrome specimens for Vitamin B12 levels. Performed at [facility], [address], [place], [place] [address]   Potassium     Status: Abnormal   Collection Time: 03/15/23  6:26 PM  Result Value Ref Range   Potassium 3.4 (L) 3.5 - 5.1 mmol/L    Comment: Performed at [facility], [address], [place], [place] [address]    Blood Alcohol level:  Lab Results  Component Value Date   [facility] <10 03/15/2023   ETH <10 08/14/2018    Metabolic Disorder Labs:  Lab Results  Component Value Date   HGBA1C 6.1 (H) 03/15/2023   MPG 128.37 03/15/2023   No results found for: PROLACTIN Lab Results  Component Value Date   CHOL 197 08/05/2021   TRIG 50 08/05/2021   HDL 59 08/05/2021   CHOLHDL 3.3 08/05/2021   VLDL 17 11/09/2007   LDLCALC 129 (H) 08/05/2021   LDLCALC 165 (H) 11/09/2007    Current Medications: Current Facility-Administered Medications  Medication Dose Route Frequency Provider  Last Rate Last Admin   acetaminophen  (TYLENOL ) tablet 650 mg  650 mg Oral Q6H PRN Coleman, Carolyn H, NP   650 mg at 03/16/23 1821   alum & mag hydroxide-simeth (MAALOX/MYLANTA) 200-200-20 MG/5ML suspension 30 mL  30 mL Oral Q4H PRN Mardy Elveria DEL, NP       amLODipine  (NORVASC ) tablet 10 mg  10 mg Oral Daily Coleman, Carolyn H, NP   10 mg at 03/16/23 9357   ARIPiprazole  (ABILIFY ) tablet 5 mg  5 mg Oral Daily Coleman, Carolyn H, NP   5 mg at 03/16/23 9166   butalbital -acetaminophen -caffeine  (FIORICET) 50-325-40 MG per tablet 1 tablet  1 tablet Oral Q6H PRN Sreenath, Sudheer B, MD       haloperidol  (HALDOL ) tablet 5 mg  5 mg Oral TID PRN Mardy Elveria DEL, NP   5 mg at 03/16/23 1047   And   diphenhydrAMINE  (BENADRYL ) capsule 50 mg  50 mg Oral TID PRN Coleman, Carolyn H, NP   50 mg at 03/16/23 1047   haloperidol  lactate (HALDOL ) injection 5 mg  5 mg Intramuscular TID PRN Mardy Elveria DEL, NP       And   diphenhydrAMINE  (BENADRYL ) injection 50 mg  50 mg Intramuscular TID PRN Mardy Elveria DEL, NP       And   LORazepam  (ATIVAN ) injection 2 mg  2 mg Intramuscular TID PRN Coleman, Carolyn H, NP       haloperidol  lactate (HALDOL ) injection 10 mg  10 mg Intramuscular TID PRN Mardy Elveria DEL, NP       And   diphenhydrAMINE  (BENADRYL ) injection 50 mg  50 mg Intramuscular TID PRN Mardy Elveria DEL, NP       And   LORazepam  (ATIVAN ) injection 2 mg  2 mg Intramuscular TID PRN Mardy Elveria DEL, NP       hydrochlorothiazide  (HYDRODIURIL ) tablet 25 mg  25 mg Oral Daily Coleman, Carolyn H, NP   25 mg at 03/16/23 9357   hydrOXYzine  (ATARAX ) tablet 25 mg  25 mg Oral TID PRN Coleman, Carolyn H, NP   25 mg at 03/16/23 0625   ibuprofen  (ADVIL ) tablet 400 mg  400 mg Oral Q6H PRN Sreenath, Sudheer B, MD       magnesium  hydroxide (MILK OF MAGNESIA) suspension 30 mL  30 mL Oral Daily PRN Mardy Elveria DEL, NP       methocarbamol  (ROBAXIN ) tablet 500 mg  500 mg Oral  BID Coleman, Carolyn H, NP   500 mg at  03/16/23 1740   sertraline  (ZOLOFT ) tablet 25 mg  25 mg Oral Daily Coleman, Carolyn H, NP   25 mg at 03/16/23 9166   traZODone  (DESYREL ) tablet 50 mg  50 mg Oral QHS PRN Coleman, Carolyn H, NP       PTA Medications: Medications Prior to Admission  Medication Sig Dispense Refill Last Dose/Taking   acetaminophen  (TYLENOL ) 500 MG tablet Take 500 mg by mouth every 6 (six) hours as needed for mild pain (pain score 1-3), moderate pain (pain score 4-6), fever or headache.      amLODipine  (NORVASC ) 10 MG tablet TAKE 1 TABLET(10 MG) BY MOUTH DAILY 90 tablet 3    ARIPiprazole  (ABILIFY ) 5 MG tablet Take 1 tablet (5 mg total) by mouth daily.      hydrochlorothiazide  (HYDRODIURIL ) 25 MG tablet Take 1 tablet (25 mg total) by mouth daily. 90 tablet 3    methocarbamol  (ROBAXIN ) 500 MG tablet Take 1 tablet (500 mg total) by mouth 2 (two) times daily. 20 tablet 0    sertraline  (ZOLOFT ) 25 MG tablet Take 1 tablet (25 mg total) by mouth daily. (Patient not taking: Reported on 03/15/2023) 45 tablet 0     Musculoskeletal: Strength & Muscle Tone: within normal limits Gait & Station: normal Patient leans: N/A             Psychiatric Specialty Exam:  Presentation  General Appearance:  Appropriate for Environment; Fairly Groomed  Eye Contact: Minimal (She is cooperative and attentive during the interview, maintaining appropriate eye contact.)  Speech: Clear and Coherent; Normal Rate  Speech Volume: Normal  Handedness: Right   Mood and Affect  Mood: Depressed; Hopeless; Labile  Affect: Tearful   Thought Process  Thought Processes: Goal Directed; Coherent  Descriptions of Associations:Intact  Orientation:Full (Time, Place and Person)  Thought Content:WDL (with self-deprecating)  History of Schizophrenia/Schizoaffective disorder:No  Duration of Psychotic Symptoms: 2 weeks Hallucinations:Hallucinations: Auditory Description of Auditory Hallucinations: a man is talking to  me  Ideas of Reference:None  Suicidal Thoughts:Suicidal Thoughts: Yes, Passive SI Active Intent and/or Plan: Without Plan; Without Intent; Without Access to Means; Without Means to Carry Out SI Passive Intent and/or Plan: Without Intent; Without Means to Carry Out; Without Plan; Without Access to Means  Homicidal Thoughts:Homicidal Thoughts: No   Sensorium  Memory: Immediate Fair; Recent Fair; Remote Fair  Judgment: Poor (as evidenced by her inability to ensure personal safety and her consideration of suicidal actions.)  Insight: Lacking (into the severity of her condition, acknowledging her symptoms but not fully recognizing the need for consistent treatment.)   Executive Functions  Concentration: Fair  Attention Span: Fair  Recall: Fair  Fund of Knowledge: Good  Language: Good   Psychomotor Activity  Psychomotor Activity: Psychomotor Activity: Normal   Assets  Assets: Housing; Manufacturing Systems Engineer; Social Support   Sleep  Sleep: Sleep: Fair Number of Hours of Sleep: 6    Physical Exam: Physical Exam Vitals and nursing note reviewed.  Constitutional:      Appearance: Normal appearance.  HENT:     Head: Normocephalic and atraumatic.     Nose: Nose normal.  Pulmonary:     Effort: Pulmonary effort is normal.  Musculoskeletal:        General: Normal range of motion.     Cervical back: Normal range of motion.  Neurological:     General: No focal deficit present.     Mental Status: She is alert  and oriented to person, place, and time. Mental status is at baseline.  Psychiatric:        Attention and Perception: Attention normal. She perceives auditory hallucinations.        Mood and Affect: Mood is anxious and depressed. Affect is flat.        Speech: Speech normal.        Behavior: Behavior is withdrawn. Behavior is cooperative.        Thought Content: Thought content normal.        Cognition and Memory: Cognition and memory normal.         Judgment: Judgment is impulsive.     Comments: Pacing    Review of Systems  Musculoskeletal:  Positive for neck pain.  Neurological:  Positive for headaches.  Psychiatric/Behavioral:  Positive for depression, hallucinations and suicidal ideas. The patient is nervous/anxious and has insomnia.   All other systems reviewed and are negative.  Blood pressure (!) 144/91, pulse 77, temperature 97.9 F (36.6 C), resp. rate 17, height 5' 2 (1.575 m), weight 93.4 kg, last menstrual period 03/10/2023, SpO2 100%. Body mass index is 37.68 kg/m.   Treatment Plan Summary: Daily contact with patient to assess and evaluate symptoms and progress in treatment and Medication management Aripiprazole  5 mg orally daily to address psychotic symptoms and augment antidepressant therapy. Initiate sertraline  50 mg orally daily for depressive symptoms. Administer potassium chloride  40 mEq orally as a single dose to address any potential hypokalemia. Fioricet 1 tablet every 6 hours as needed. Engage the patient in individual psychotherapy sessions to explore underlying issues contributing to her depression and to develop coping strategies. Provide psychoeducation regarding her diagnosis, the importance of medication adherence, and the benefits of engaging in therapeutic activities. Medical Hospitalist consult for Headaches and Hypokalemia Observation Level/Precautions:  Continuous Observation 15 minute checks  Laboratory:   lipids  Psychotherapy:    Medications:    Consultations:    Discharge Concerns:    Estimated LOS:  Other:     Physician Treatment Plan for Primary Diagnosis: Severe recurrent depression with psychosis (HCC) Long Term Goal(s): Improvement in symptoms so as ready for discharge  Short Term Goals: Ability to identify changes in lifestyle to reduce recurrence of condition will improve, Ability to verbalize feelings will improve, Ability to disclose and discuss suicidal ideas, Ability to  demonstrate self-control will improve, Ability to identify and develop effective coping behaviors will improve, Ability to maintain clinical measurements within normal limits will improve, Compliance with prescribed medications will improve, and Ability to identify triggers associated with substance abuse/mental health issues will improve  Physician Treatment Plan for Secondary Diagnosis: Principal Problem:   Severe recurrent depression with psychosis (HCC) Active Problems:   Essential hypertension, benign  Long Term Goal(s): Improvement in symptoms so as ready for discharge  Short Term Goals: Ability to identify changes in lifestyle to reduce recurrence of condition will improve, Ability to verbalize feelings will improve, Ability to disclose and discuss suicidal ideas, Ability to demonstrate self-control will improve, Ability to identify and develop effective coping behaviors will improve, Ability to maintain clinical measurements within normal limits will improve, Compliance with prescribed medications will improve, and Ability to identify triggers associated with substance abuse/mental health issues will improve  I certify that inpatient services furnished can reasonably be expected to improve the patient's condition.    Brad GORMAN Moats, NP 2/5/20257:12 PM

## 2023-03-16 NOTE — Tx Team (Signed)
 Initial Treatment Plan 03/16/2023 2:55 AM Kendra Harris [id number]    PATIENT STRESSORS: Health problems     PATIENT STRENGTHS: Motivation for treatment/growth    PATIENT IDENTIFIED PROBLEMS: Depression                     DISCHARGE CRITERIA:  Verbal commitment to aftercare and medication compliance  PRELIMINARY DISCHARGE PLAN: Outpatient therapy  PATIENT/FAMILY INVOLVEMENT: This treatment plan has been presented to and reviewed with the patient, Kendra Harris.  The patient has been given the opportunity to ask questions and make suggestions.  Osie Raman, RN 03/16/2023, 2:55 AM

## 2023-03-16 NOTE — Group Note (Signed)
 Date:  03/16/2023 Time:  9:36 PM  Group Topic/Focus:  Wrap-Up Group:   The focus of this group is to help patients review their daily goal of treatment and discuss progress on daily workbooks.    Participation Level:  Did Not Attend  Participation Quality:   none  Affect:   none  Cognitive:   none  Insight: None  Engagement in Group:   none  Modes of Intervention:   none  Additional Comments:  none   Kerri Katz 03/16/2023, 9:36 PM

## 2023-03-16 NOTE — Progress Notes (Signed)
   03/16/23 1130  Psych Admission Type (Psych Patients Only)  Admission Status Voluntary  Psychosocial Assessment  Patient Complaints Sleep disturbance;Anxiety;Depression;Restlessness;Other (Comment) (auditory hallucinations; patient states I feel down, just feeling sad.)  Eye Contact Fair  Facial Expression Anxious;Worried  Affect Anxious;Sullen;Preoccupied  Speech Logical/coherent  Interaction Assertive  Motor Activity Restless;Slow (patient states my nerves are bad, I feel like I can't sit still.)  Appearance/Hygiene Unremarkable  Behavior Characteristics Cooperative;Appropriate to situation  Mood Anxious;Pleasant  Aggressive Behavior  Effect No apparent injury  Thought Process  Coherency WDL  Content WDL  Delusions None reported or observed  Perception Hallucinations  Hallucination Auditory  Judgment WDL  Confusion None  Danger to Self  Current suicidal ideation? Denies  Self-Injurious Behavior No self-injurious ideation or behavior indicators observed or expressed   Agreement Not to Harm Self Yes  Description of Agreement Verbal

## 2023-03-16 NOTE — Progress Notes (Signed)
 Patient came to this writer stating  something in my head is telling me I have a blood clot and that I'm gonna die. Patient was given PRN medication to help with agitation/anxiety. Patient tolerated medication well, without any issues. Patient remains safe on the unit and will continue to be monitored.   03/16/23 1047  Pain Assessment  Pain Scale 0-10  Pain Score 0  Complaints & Interventions  Complains of Agitation;Anxiety;Hallucinations  Interventions Medication (see MAR)

## 2023-03-16 NOTE — [facility] Counselor (Signed)
 Adult Comprehensive Assessment  Patient ID: Kendra Harris, female   DOB: November 21, 1980, 43 y.o.   MRN: [id number]  Information Source: Information source: Patient  Current Stressors:  Patient states their primary concerns and needs for treatment are:: I wanted to get hit by a car because I was in pain. Patient states their goals for this hospitilization and ongoing recovery are:: I want to get better. I have 5 kids. Educational / Learning stressors: Patient denies. Employment / Job issues: Patient denies. Family Relationships: Patient denies. Financial / Lack of resources (include bankruptcy): Patient denies. Housing / Lack of housing: Patient denies. Physical health (include injuries & life threatening diseases): Patient denies. Social relationships: Patient denies. Substance abuse: Patient denies. Bereavement / Loss: Patient reports her grandmother passed in 2006.  Living/Environment/Situation:  Living Arrangements: Spouse/significant other Living conditions (as described by patient or guardian): WNL Who else lives in the home?: Me, my 5 kids, and my boyfriend. They're at my house packing my things up to move in with my sister. How long has patient lived in current situation?: Patient reports living with her boyfriend for two years. What is atmosphere in current home: Comfortable, Loving, Supportive  Family History:  Marital status: Long term relationship Long term relationship, how long?: 5 years. What types of issues is patient dealing with in the relationship?: None reported. Are you sexually active?: Yes What is your sexual orientation?: heterosexual Has your sexual activity been affected by drugs, alcohol, medication, or emotional stress?: Patient denies. Does patient have children?: Yes How many children?: 5 How is patient's relationship with their children?: It's good.  Childhood History:  By whom was/is the patient raised?: Mother Description of patient's  relationship with caregiver when they were a child: It was good. Patient's description of current relationship with people who raised him/her: It's good. How were you disciplined when you got in trouble as a child/adolescent?: Time outs. Does patient have siblings?: Yes Number of Siblings: 3 Description of patient's current relationship with siblings: Wilbert very close. Did patient suffer any verbal/emotional/physical/sexual abuse as a child?: No Did patient suffer from severe childhood neglect?: No Has patient ever been sexually abused/assaulted/raped as an adolescent or adult?: No Was the patient ever a victim of a crime or a disaster?: No Witnessed domestic violence?: No Has patient been affected by domestic violence as an adult?: No  Education:  Highest grade of school patient has completed: 10th. Currently a student?: No Learning disability?: No  Employment/Work Situation:   Employment Situation: Unemployed Patient's Job has Been Impacted by Current Illness: Yes Describe how Patient's Job has Been Impacted: Absenteeism What is the Longest Time Patient has Held a Job?: 3 years Where was the Patient Employed at that Time?: [employer] Has Patient ever Been in the [employer]?: No  Financial Resources:   Surveyor, Quantity resources: [employer], [employer], Medicaid Does patient have a lawyer or guardian?: No  Alcohol/Substance Abuse:   What has been your use of drugs/alcohol within the last 12 months?: Patient denies. If attempted suicide, did drugs/alcohol play a role in this?: No Alcohol/Substance Abuse Treatment Hx: Denies past history Has alcohol/substance abuse ever caused legal problems?: No  Social Support System:   Patient's Community Support System: Good Describe Community Support System: My siblings, mother and boyfriend Type of faith/religion: Patient denies. How does patient's faith help to cope with current illness?: Patient  denies.  Leisure/Recreation:   Do You Have Hobbies?: No  Strengths/Needs:   What is the patient's perception of their  strengths?: Patient denies. Patient states they can use these personal strengths during their treatment to contribute to their recovery: Patient denies. Patient states these barriers may affect/interfere with their treatment: Patient denies. Patient states these barriers may affect their return to the community: Patient denies.  Discharge Plan:   Currently receiving community mental health services: No Patient states concerns and preferences for aftercare planning are: Patient denies. Patient states they will know when they are safe and ready for discharge when: When I don't have these thoughts. Does patient have access to transportation?: No Does patient have financial barriers related to discharge medications?: No Patient description of barriers related to discharge medications: Patient will need transportation. Plan for no access to transportation at discharge: CSW team to cooridinate transportation at discharge. Will patient be returning to same living situation after discharge?: Yes  Summary/Recommendations:   Summary and Recommendations (to be completed by the evaluator): Patient Is a 43 year old female who presented voluntarily due to worsening depressions. Patient reports that no one event triggered her depression, but that she has been in chronic pain and that this may have contribted. Patient endorsed employment and family issues. Patient reports being fired from her job at [employer] due to absenteeism related to family issues. Patient has five children and reports being overwhelmed at times. Patient currently resides with her children and boyfriend, but Patient reports that she will be moving herself and her children into her sister's home where her mother will reside as well. Patient reports that this is so have adequate supports put in place for her wellbeing.  Patient described her support system as "good" describing that her sister and mother are great supports. Patient denied previous suicide attempts. Pt stated that she has been psychiatrically hospitalized once in 2006 after her grandmother's death when she became severely depressed. Patient  denied any substance use. Patient is not currently seeing a therapist but is open to a referral pending discharge. Pt denies SI/HI as well as AVH at this time. Recommendations include: crisis stabilization, therapeutic milieu, encourage group attendance and participation, medication management for mood stabilization and development of comprehensive mental wellness/sobriety plan.  Mavi Un M Ashlei Chinchilla. 03/16/2023

## 2023-03-16 NOTE — ED Notes (Signed)
Safe transport contacted for transfer to BMU.

## 2023-03-16 NOTE — Group Note (Signed)
 Date:  03/16/2023 Time:  11:18 AM  Group Topic/Focus:  Dimensions of Wellness:   The focus of this group is to introduce the topic of wellness and discuss the role each dimension of wellness plays in total health. Goals Group:   The focus of this group is to help patients establish daily goals to achieve during treatment and discuss how the patient can incorporate goal setting into their daily lives to aide in recovery.    Participation Level:  Active  Participation Quality:  Appropriate and Attentive  Affect:  Appropriate  Cognitive:  Alert, Appropriate, and Oriented  Insight: Appropriate  Engagement in Group:  Developing/Improving and Engaged  Modes of Intervention:  Activity  Additional Comments:    Sevrin Sally 03/16/2023, 11:18 AM

## 2023-03-16 NOTE — ED Notes (Signed)
 Patient resting quietly in bed with eyes closed. Respirations equal and unlabored, skin warm and dry, NAD. Routine safety checks conducted according to facility protocol. Will continue to monitor for safety.

## 2023-03-16 NOTE — Group Note (Signed)
 Date:  03/16/2023 Time:  6:25 PM  Group Topic/Focus:  Outdoor recreation structured activity    Participation Level:  Did Not Attend   Kendra Harris 03/16/2023, 6:25 PM

## 2023-03-16 NOTE — Group Note (Signed)
 [facility] LCSW Group Therapy Note   Group Date: 03/16/2023 Start Time: 1300 End Time: 1345   Type of Therapy/Topic:  Group Therapy:  Emotion Regulation  Participation Level:  Minimal    Description of Group:    The purpose of this group is to assist patients in learning to regulate negative emotions and experience positive emotions. Patients will be guided to discuss ways in which they have been vulnerable to their negative emotions. These vulnerabilities will be juxtaposed with experiences of positive emotions or situations, and patients challenged to use positive emotions to combat negative ones. Special emphasis will be placed on coping with negative emotions in conflict situations, and patients will process healthy conflict resolution skills.  Therapeutic Goals: Patient will identify two positive emotions or experiences to reflect on in order to balance out negative emotions:  Patient will label two or more emotions that they find the most difficult to experience:  Patient will be able to demonstrate positive conflict resolution skills through discussion or role plays:   Summary of Patient Progress: Patient was present for the majority of the group discussion. She did not speak much during the discussion but her behavior was appropriate. She appeared to attend to the discussion. Pt appeared to be open and receptive to feedback/comments from both her peers and facilitator. However insight to topic and herself appears questionable.    Therapeutic Modalities:   Cognitive Behavioral Therapy Feelings Identification Dialectical Behavioral Therapy   Nadara JONELLE Fam, LCSW

## 2023-03-16 NOTE — [facility] Suicide Risk Assessment (Signed)
 [facility] Admission Suicide Risk Assessment   Nursing information obtained from:  Family Demographic factors:  NA Current Mental Status:  NA Loss Factors:  NA Historical Factors:  NA Risk Reduction Factors:  NA  Total Time spent with patient: 2 hours Principal Problem: Severe recurrent depression with psychosis (HCC) Diagnosis:  Principal Problem:   Severe recurrent depression with psychosis (HCC) Active Problems:   Essential hypertension, benign  Subjective Data: 43 year old African American female with a history of depression, including a prior inpatient psychiatric admission at [facility] in 2020. She currently has no outpatient mental health providers and is not on any psychiatric medications. She lives with her boyfriend and five children (ages 54, 79, 3, 67, and 39) and is currently unemployed. The patient denies any substance use.Approximately two weeks ago, she began experiencing a resurgence of depressive symptoms without any identifiable triggers. These symptoms include feelings of hopelessness, helplessness, tearfulness, irritability, worthlessness, decreased focus and motivation, decreased appetite (leading to a 14-pound weight loss), increased sleep, and difficulty getting out of bed during the day. She reports engaging in aimless walking, stating, I do not even know where I am going; I am just walking.Additionally, she has been experiencing auditory hallucinations, hearing a female voice that tells her, I am worthless, I mean nothing, and to kill myself. She has considered the possibility of being hit by a car as a means to end her life and is unable to verbally contract for safety. She denies access to firearms or other weapons, denies homicidal ideations, and denies visual hallucinations.The patient agrees to an inpatient psychiatric admission for further evaluation and treatment.Notably, she was recently seen at [facility] Emergency Department for right-sided  trapezius pain, posterior tension headache, thigh and leg tightness, and numbness in her feet. She was prescribed methocarbamol  and sertraline  at that time but reports that she did not start taking the sertraline . She has taken the methocarbamol  for pain, which has been somewhat helpful. She continues to endorse some upper right trapezius/shoulder pain, which at times radiates into the  Continued Clinical Symptoms:  Alcohol Use Disorder Identification Test Final Score (AUDIT): 0 The Alcohol Use Disorders Identification Test, Guidelines for Use in Primary Care, Second Edition.  World Science Writer [facility]). Score between 0-7:  no or low risk or alcohol related problems. Score between 8-15:  moderate risk of alcohol related problems. Score between 16-19:  high risk of alcohol related problems. Score 20 or above:  warrants further diagnostic evaluation for alcohol dependence and treatment.   CLINICAL FACTORS:   Depression:   Anhedonia Delusional Hopelessness Insomnia Dysthymia   Musculoskeletal: Strength & Muscle Tone: within normal limits Gait & Station: normal Patient leans: N/A  Psychiatric Specialty Exam:  Presentation  General Appearance:  Appropriate for Environment; Fairly Groomed  Eye Contact: Minimal (She is cooperative and attentive during the interview, maintaining appropriate eye contact.)  Speech: Clear and Coherent; Normal Rate  Speech Volume: Normal  Handedness: Right   Mood and Affect  Mood: Depressed; Hopeless; Labile  Affect: Tearful   Thought Process  Thought Processes: Goal Directed; Coherent  Descriptions of Associations:Intact  Orientation:Full (Time, Place and Person)  Thought Content:WDL (with self-deprecating)  History of Schizophrenia/Schizoaffective disorder:No  Duration of Psychotic Symptoms:Less than six months  Hallucinations:Hallucinations: Auditory Description of Auditory Hallucinations: a man is talking to  me  Ideas of Reference:None  Suicidal Thoughts:Suicidal Thoughts: Yes, Passive SI Active Intent and/or Plan: Without Plan; Without Intent; Without Access to Means; Without Means to Carry Out SI  Passive Intent and/or Plan: Without Intent; Without Means to Carry Out; Without Plan; Without Access to Means  Homicidal Thoughts:Homicidal Thoughts: No   Sensorium  Memory: Immediate Fair; Recent Fair; Remote Fair  Judgment: Poor (as evidenced by her inability to ensure personal safety and her consideration of suicidal actions.)  Insight: Lacking (into the severity of her condition, acknowledging her symptoms but not fully recognizing the need for consistent treatment.)   Executive Functions  Concentration: Fair  Attention Span: Fair  Recall: Fair  Fund of Knowledge: Good  Language: Good   Psychomotor Activity  Psychomotor Activity: Psychomotor Activity: Normal   Assets  Assets: Housing; Manufacturing Systems Engineer; Social Support   Sleep  Sleep: Sleep: Fair Number of Hours of Sleep: 6    Physical Exam: Physical Exam Vitals and nursing note reviewed.  Constitutional:      Appearance: Normal appearance.  HENT:     Head: Normocephalic and atraumatic.     Nose: Nose normal.  Pulmonary:     Effort: Pulmonary effort is normal.  Musculoskeletal:        General: Normal range of motion.     Cervical back: Normal range of motion.  Neurological:     General: No focal deficit present.     Mental Status: She is alert. Mental status is at baseline.  Psychiatric:        Attention and Perception: Attention normal. She perceives auditory hallucinations.        Mood and Affect: Mood is anxious. Affect is flat.        Speech: Speech normal.        Behavior: Behavior is withdrawn.        Thought Content: Thought content is delusional. Thought content includes suicidal ideation.        Cognition and Memory: Memory normal. Cognition is impaired.        Judgment: Judgment is  impulsive.     Comments: Noted pacing the floor    Review of Systems  Neurological:  Positive for dizziness and headaches.  Psychiatric/Behavioral:  Positive for depression, hallucinations and suicidal ideas. The patient is nervous/anxious and has insomnia.   All other systems reviewed and are negative.  Blood pressure (!) 144/91, pulse 77, temperature 97.9 F (36.6 C), resp. rate 17, height 5' 2 (1.575 m), weight 93.4 kg, last menstrual period 03/10/2023, SpO2 100%. Body mass index is 37.68 kg/m.   COGNITIVE FEATURES THAT CONTRIBUTE TO RISK:  None    SUICIDE RISK:   Minimal: No identifiable suicidal ideation.  Patients presenting with no risk factors but with morbid ruminations; may be classified as minimal risk based on the severity of the depressive symptoms  PLAN OF CARE:   Aripiprazole  5 mg orally daily to address psychotic symptoms and augment antidepressant therapy. Initiate sertraline  50 mg orally daily for depressive symptoms. Administer potassium chloride  40 mEq orally as a single dose to address any potential hypokalemia. Fioricet 1 tablet every 6 hours as needed. Engage the patient in individual psychotherapy sessions to explore underlying issues contributing to her depression and to develop coping strategies. Provide psychoeducation regarding her diagnosis, the importance of medication adherence, and the benefits of engaging in therapeutic activities. Medical Hospitalist consult for Headaches and Hypokalemia  I certify that inpatient services furnished can reasonably be expected to improve the patient's condition.   Brad GORMAN Moats, NP 03/16/2023, 7:07 PM

## 2023-03-16 NOTE — Plan of Care (Signed)

## 2023-03-16 NOTE — [facility] Counselor (Signed)
 The patient Kendra Harris has refused to provide written consent for family/significant other to be contacted. Patient has declined Family/Significant Other to be provided with Suicide Prevention Education during admission and/or prior to discharge.  Physician notified.  SPE completed with pt, as pt refused to consent to family contact. SPI pamphlet provided to pt and pt was encouraged to share information with support network, ask questions, and talk about any concerns relating to SPE. Pt denies access to guns/firearms and verbalized understanding of information provided. Mobile Crisis information also provided to pt.  SPE materials to be provided again at discharge.    Saloma Cadena, MSW, LCSWA 03/16/2023 4:30 PM

## 2023-03-16 NOTE — [facility] Suicide Risk Assessment (Signed)
 [facility] INPATIENT:  Family/Significant Other Suicide Prevention Education  Suicide Prevention Education:  Patient Refusal for Family/Significant Other Suicide Prevention Education: The patient Kendra Harris has refused to provide written consent for family/significant other to be provided Family/Significant Other Suicide Prevention Education during admission and/or prior to discharge.  Physician notified.  SPE completed with pt, as pt refused to consent to family contact. SPI pamphlet provided to pt and pt was encouraged to share information with support network, ask questions, and talk about any concerns relating to SPE. Pt denies access to guns/firearms and verbalized understanding of information provided. Mobile Crisis information also provided to pt.    Cameren Earnest M Reba Hulett 03/16/2023, 4:27 PM

## 2023-03-16 NOTE — Consult Note (Signed)
 Brief consultation note  Consult requested by Brad Moats, NP on patient Dr. Estelle.  Reason for consultation was intractable headache, hypokalemia, foggy headed feeling  Patient is admitted to the behavioral service and had recently had her home blood pressure medications restarted.  She was noted to have hypokalemia down to 2.6 which improved to 3.4.  Per my conversation with Ms. Moats no obvious concerning findings of facial droop to indicate some underlying disorder.  On my evaluation of the patient she is sitting comfortably.  There is no evidence of any facial droop or other acute neurologic findings to suggest CVA/TIA.  Patient does endorse that she is attempted Tylenol  with minimal effect.  Again no concerning neurologic findings.   Physical exam. General: Pleasant female sitting up in chair Neurologic: Cranial nerves intact.  Alert and oriented x 3  Recommendations:  1.  Replace potassium p.o. K-Dur 40 mEq x 1 (order entered) 2.  continue current antihypertensive regimen, no changes recommended at this time 3.  If Tylenol  ineffective for headache suggest ibuprofen  400 mg.  If neither Tylenol  nor ibuprofen  are effective such as Fioricet 1 tab every 6 hours as needed.  All orders have been entered.  Thank you for allowing us  to participate in the care of your patient.   [facility] hospitalist service to sign off at this time.  Calvin Robson MD

## 2023-03-16 NOTE — BH IP Treatment Plan (Signed)
 Interdisciplinary Treatment and Diagnostic Plan Update  03/16/2023 Time of Session: 09:33 Kendra Harris MRN: [id number]  Principal Diagnosis: MDD (major depressive disorder), severe (HCC)  Secondary Diagnoses: Principal Problem:   MDD (major depressive disorder), severe (HCC)   Current Medications:  Current Facility-Administered Medications  Medication Dose Route Frequency Provider Last Rate Last Admin   acetaminophen  (TYLENOL ) tablet 650 mg  650 mg Oral Q6H PRN Coleman, Carolyn H, NP       alum & mag hydroxide-simeth (MAALOX/MYLANTA) 200-200-20 MG/5ML suspension 30 mL  30 mL Oral Q4H PRN Coleman, Carolyn H, NP       amLODipine  (NORVASC ) tablet 10 mg  10 mg Oral Daily Coleman, Carolyn H, NP   10 mg at 03/16/23 0642   ARIPiprazole  (ABILIFY ) tablet 5 mg  5 mg Oral Daily Coleman, Carolyn H, NP   5 mg at 03/16/23 9166   haloperidol  (HALDOL ) tablet 5 mg  5 mg Oral TID PRN Mardy Elveria DEL, NP       And   diphenhydrAMINE  (BENADRYL ) capsule 50 mg  50 mg Oral TID PRN Mardy Elveria DEL, NP       haloperidol  lactate (HALDOL ) injection 5 mg  5 mg Intramuscular TID PRN Mardy Elveria DEL, NP       And   diphenhydrAMINE  (BENADRYL ) injection 50 mg  50 mg Intramuscular TID PRN Mardy Elveria DEL, NP       And   LORazepam  (ATIVAN ) injection 2 mg  2 mg Intramuscular TID PRN Coleman, Carolyn H, NP       haloperidol  lactate (HALDOL ) injection 10 mg  10 mg Intramuscular TID PRN Mardy Elveria DEL, NP       And   diphenhydrAMINE  (BENADRYL ) injection 50 mg  50 mg Intramuscular TID PRN Mardy Elveria DEL, NP       And   LORazepam  (ATIVAN ) injection 2 mg  2 mg Intramuscular TID PRN Coleman, Carolyn H, NP       hydrochlorothiazide  (HYDRODIURIL ) tablet 25 mg  25 mg Oral Daily Coleman, Carolyn H, NP   25 mg at 03/16/23 9357   hydrOXYzine  (ATARAX ) tablet 25 mg  25 mg Oral TID PRN Coleman, Carolyn H, NP   25 mg at 03/16/23 0625   magnesium  hydroxide (MILK OF MAGNESIA) suspension 30 mL  30 mL Oral Daily  PRN Mardy Elveria DEL, NP       methocarbamol  (ROBAXIN ) tablet 500 mg  500 mg Oral BID Coleman, Carolyn H, NP   500 mg at 03/16/23 9166   sertraline  (ZOLOFT ) tablet 25 mg  25 mg Oral Daily Coleman, Carolyn H, NP   25 mg at 03/16/23 9166   traZODone  (DESYREL ) tablet 50 mg  50 mg Oral QHS PRN Coleman, Carolyn H, NP       PTA Medications: Medications Prior to Admission  Medication Sig Dispense Refill Last Dose/Taking   acetaminophen  (TYLENOL ) 500 MG tablet Take 500 mg by mouth every 6 (six) hours as needed for mild pain (pain score 1-3), moderate pain (pain score 4-6), fever or headache.      amLODipine  (NORVASC ) 10 MG tablet TAKE 1 TABLET(10 MG) BY MOUTH DAILY 90 tablet 3    ARIPiprazole  (ABILIFY ) 5 MG tablet Take 1 tablet (5 mg total) by mouth daily.      hydrochlorothiazide  (HYDRODIURIL ) 25 MG tablet Take 1 tablet (25 mg total) by mouth daily. 90 tablet 3    methocarbamol  (ROBAXIN ) 500 MG tablet Take 1 tablet (500 mg total) by mouth 2 (two) times daily.  20 tablet 0    sertraline  (ZOLOFT ) 25 MG tablet Take 1 tablet (25 mg total) by mouth daily. (Patient not taking: Reported on 03/15/2023) 45 tablet 0     Patient Stressors: Health problems    Patient Strengths: Motivation for treatment/growth   Treatment Modalities: Medication Management, Group therapy, Case management,  1 to 1 session with clinician, Psychoeducation, Recreational therapy.   Physician Treatment Plan for Primary Diagnosis: MDD (major depressive disorder), severe (HCC) Long Term Goal(s):     Short Term Goals:    Medication Management: Evaluate patient's response, side effects, and tolerance of medication regimen.  Therapeutic Interventions: 1 to 1 sessions, Unit Group sessions and Medication administration.  Evaluation of Outcomes: Not Met  Physician Treatment Plan for Secondary Diagnosis: Principal Problem:   MDD (major depressive disorder), severe (HCC)  Long Term Goal(s):     Short Term Goals:       Medication  Management: Evaluate patient's response, side effects, and tolerance of medication regimen.  Therapeutic Interventions: 1 to 1 sessions, Unit Group sessions and Medication administration.  Evaluation of Outcomes: Not Met   RN Treatment Plan for Primary Diagnosis: MDD (major depressive disorder), severe (HCC) Long Term Goal(s): Knowledge of disease and therapeutic regimen to maintain health will improve  Short Term Goals: Ability to remain free from injury will improve, Ability to verbalize frustration and anger appropriately will improve, Ability to demonstrate self-control, Ability to participate in decision making will improve, Ability to verbalize feelings will improve, Ability to disclose and discuss suicidal ideas, Ability to identify and develop effective coping behaviors will improve, and Compliance with prescribed medications will improve  Medication Management: RN will administer medications as ordered by provider, will assess and evaluate patient's response and provide education to patient for prescribed medication. RN will report any adverse and/or side effects to prescribing provider.  Therapeutic Interventions: 1 on 1 counseling sessions, Psychoeducation, Medication administration, Evaluate responses to treatment, Monitor vital signs and CBGs as ordered, Perform/monitor CIWA, COWS, AIMS and Fall Risk screenings as ordered, Perform wound care treatments as ordered.  Evaluation of Outcomes: Not Met   LCSW Treatment Plan for Primary Diagnosis: MDD (major depressive disorder), severe (HCC) Long Term Goal(s): Safe transition to appropriate next level of care at discharge, Engage patient in therapeutic group addressing interpersonal concerns.  Short Term Goals: Engage patient in aftercare planning with referrals and resources, Increase social support, Increase ability to appropriately verbalize feelings, Increase emotional regulation, Facilitate acceptance of mental health diagnosis and  concerns, Identify triggers associated with mental health/substance abuse issues, and Increase skills for wellness and recovery  Therapeutic Interventions: Assess for all discharge needs, 1 to 1 time with Social worker, Explore available resources and support systems, Assess for adequacy in community support network, Educate family and significant other(s) on suicide prevention, Complete Psychosocial Assessment, Interpersonal group therapy.  Evaluation of Outcomes: Not Met   Progress in Treatment: Attending groups: No. Participating in groups: No. Taking medication as prescribed: Yes. Toleration medication: Yes. Family/Significant other contact made: No, will contact:  when given permission.  Patient understands diagnosis: Yes. Discussing patient identified problems/goals with staff: Yes. Medical problems stabilized or resolved: Yes. Denies suicidal/homicidal ideation: Yes. Issues/concerns per patient self-inventory: No. Other: none.   New problem(s) identified: No, Describe:  none identified.  New Short Term/Long Term Goal(s): medication management for mood stabilization; elimination of SI thoughts; development of comprehensive mental wellness plan.  Patient Goals: Trying to get better.   Discharge Plan or Barriers: CSW will assist pt  with development of an appropriate aftercare/discharge plan.   Reason for Continuation of Hospitalization: Depression Medication stabilization Suicidal ideation  Estimated Length of Stay: 1-7 days  Last 3 [place] Suicide Severity Risk Score: Flowsheet Row Admission (Current) from 03/16/2023 in [facility] ED from 03/15/2023 in [facility] ED from 03/12/2023 in [facility] RISK CATEGORY No Risk Moderate Risk No Risk       Last PHQ 2/9 Scores:    03/09/2023    9:31 AM 01/14/2023    8:32 AM 11/18/2022   10:46 AM  Depression screen PHQ 2/9  Decreased Interest 0  0 0  Down, Depressed, Hopeless 0 0 0  PHQ - 2 Score 0 0 0  Altered sleeping 0 0 0  Tired, decreased energy 0 0 0  Change in appetite 0 0 0  Feeling bad or failure about yourself  0 0 0  Trouble concentrating 0 0 0  Moving slowly or fidgety/restless 0 0 0  Suicidal thoughts 0 0 0  PHQ-9 Score 0 0 0    Scribe for Treatment Team: Nadara JONELLE Fam, LCSW 03/16/2023 10:23 AM

## 2023-03-17 DIAGNOSIS — F333 Major depressive disorder, recurrent, severe with psychotic symptoms: Secondary | ICD-10-CM | POA: Diagnosis not present

## 2023-03-17 LAB — GLUCOSE, CAPILLARY
Glucose-Capillary: 119 mg/dL — ABNORMAL HIGH (ref 70–99)
Glucose-Capillary: 206 mg/dL — ABNORMAL HIGH (ref 70–99)

## 2023-03-17 NOTE — Progress Notes (Addendum)
 [facility] MD Progress Note  03/18/2023 12:16 PM Kendra Harris  MRN:  [id number] Subjective:  43 year old African American female who reports, I think the medication is working, indicating perceived therapeutic benefits. She also expresses concern about experiencing leg cramps.The patient reports subjective improvement with her current medication regimen but is experiencing leg cramps. Leg cramps can be a side effect of certain psychiatric medications, including antipsychotics and antidepressants.   The patient's anxious affect and preoccupied mood may be related to her concern about these symptoms. Principal Problem: Severe recurrent depression with psychosis (HCC) Diagnosis: Principal Problem:   Severe recurrent depression with psychosis (HCC) Active Problems:   Essential hypertension, benign  Total Time spent with patient: 1 hour  Past Psychiatric History: MDD  Past Medical History:  Past Medical History:  Diagnosis Date   DELAYED MENSES 02/11/2010   Qualifier: Diagnosis of  By: Reynaldo  MD, Khary     Hypertension    MIGRAINE HEADACHE 12/13/2007   Urinary tract infection 03/04/2011    Past Surgical History:  Procedure Laterality Date   NO PAST SURGERIES     Family History:  Family History  Problem Relation Age of Onset   Healthy Mother    Sickle cell trait Sister    Family Psychiatric  History: none reported Social History:  Social History   Substance and Sexual Activity  Alcohol Use No     Social History   Substance and Sexual Activity  Drug Use No    Social History   Socioeconomic History   Marital status: Single    Spouse name: Not on file   Number of children: Not on file   Years of education: Not on file   Highest education level: Not on file  Occupational History   Not on file  Tobacco Use   Smoking status: Never    Passive exposure: Never   Smokeless tobacco: Never  Vaping Use   Vaping status: Never Used  Substance and Sexual Activity   Alcohol  use: No   Drug use: No   Sexual activity: Yes    Birth control/protection: Condom  Other Topics Concern   Not on file  Social History Narrative   Not on file   Social Drivers of Health   Financial Resource Strain: Not on file  Food Insecurity: No Food Insecurity (03/16/2023)   Hunger Vital Sign    Worried About Running Out of Food in the Last Year: Never true    Ran Out of Food in the Last Year: Never true  Transportation Needs: No Transportation Needs (03/16/2023)   PRAPARE - Administrator, Civil Service (Medical): No    Lack of Transportation (Non-Medical): No  Physical Activity: Not on file  Stress: Not on file  Social Connections: Not on file   Additional Social History:                         Sleep: Good  Appetite:  Good  Current Medications: Current Facility-Administered Medications  Medication Dose Route Frequency Provider Last Rate Last Admin   acetaminophen  (TYLENOL ) tablet 650 mg  650 mg Oral Q6H PRN Coleman, Carolyn H, NP   650 mg at 03/18/23 0631   alum & mag hydroxide-simeth (MAALOX/MYLANTA) 200-200-20 MG/5ML suspension 30 mL  30 mL Oral Q4H PRN Coleman, Carolyn H, NP       amLODipine  (NORVASC ) tablet 10 mg  10 mg Oral Daily Coleman, Carolyn H, NP   10 mg at  03/18/23 0917   ARIPiprazole  (ABILIFY ) tablet 5 mg  5 mg Oral Daily Coleman, Carolyn H, NP   5 mg at 03/18/23 1033   butalbital -acetaminophen -caffeine  (FIORICET) 50-325-40 MG per tablet 1 tablet  1 tablet Oral Q6H PRN Jhonny Calvin NOVAK, MD       haloperidol  (HALDOL ) tablet 5 mg  5 mg Oral TID PRN Mardy Elveria DEL, NP   5 mg at 03/16/23 1047   And   diphenhydrAMINE  (BENADRYL ) capsule 50 mg  50 mg Oral TID PRN Coleman, Carolyn H, NP   50 mg at 03/16/23 1047   haloperidol  lactate (HALDOL ) injection 5 mg  5 mg Intramuscular TID PRN Mardy Elveria DEL, NP       And   diphenhydrAMINE  (BENADRYL ) injection 50 mg  50 mg Intramuscular TID PRN Mardy Elveria DEL, NP       And   LORazepam   (ATIVAN ) injection 2 mg  2 mg Intramuscular TID PRN Coleman, Carolyn H, NP       haloperidol  lactate (HALDOL ) injection 10 mg  10 mg Intramuscular TID PRN Mardy Elveria DEL, NP       And   diphenhydrAMINE  (BENADRYL ) injection 50 mg  50 mg Intramuscular TID PRN Mardy Elveria DEL, NP       And   LORazepam  (ATIVAN ) injection 2 mg  2 mg Intramuscular TID PRN Coleman, Carolyn H, NP       DULoxetine  (CYMBALTA ) DR capsule 20 mg  20 mg Oral Daily Nicholaus Brad RAMAN, NP   20 mg at 03/18/23 0915   hydrochlorothiazide  (HYDRODIURIL ) tablet 25 mg  25 mg Oral Daily Coleman, Carolyn H, NP   25 mg at 03/18/23 9083   hydrOXYzine  (ATARAX ) tablet 25 mg  25 mg Oral TID PRN Coleman, Carolyn H, NP   25 mg at 03/18/23 0654   ibuprofen  (ADVIL ) tablet 400 mg  400 mg Oral Q6H PRN Sreenath, Sudheer B, MD       magnesium  hydroxide (MILK OF MAGNESIA) suspension 30 mL  30 mL Oral Daily PRN Coleman, Carolyn H, NP       melatonin tablet 5 mg  5 mg Oral QHS Shankar Silber S, NP       methocarbamol  (ROBAXIN ) tablet 500 mg  500 mg Oral BID Coleman, Carolyn H, NP   500 mg at 03/18/23 1724   traZODone  (DESYREL ) tablet 50 mg  50 mg Oral QHS PRN Coleman, Carolyn H, NP   50 mg at 03/17/23 2103    Lab Results:  Results for orders placed or performed during the hospital encounter of 03/16/23 (from the past 48 hours)  Glucose, capillary     Status: Abnormal   Collection Time: 03/16/23  8:45 PM  Result Value Ref Range   Glucose-Capillary 118 (H) 70 - 99 mg/dL    Comment: Glucose reference range applies only to samples taken after fasting for at least 8 hours.  Glucose, capillary     Status: Abnormal   Collection Time: 03/17/23  4:23 PM  Result Value Ref Range   Glucose-Capillary 119 (H) 70 - 99 mg/dL    Comment: Glucose reference range applies only to samples taken after fasting for at least 8 hours.   Comment 1 Notify RN   Glucose, capillary     Status: Abnormal   Collection Time: 03/17/23  9:04 PM  Result Value Ref Range    Glucose-Capillary 206 (H) 70 - 99 mg/dL    Comment: Glucose reference range applies only to samples taken after fasting for  at least 8 hours.    Blood Alcohol level:  Lab Results  Component Value Date   ETH <10 03/15/2023   ETH <10 08/14/2018    Metabolic Disorder Labs: Lab Results  Component Value Date   HGBA1C 6.1 (H) 03/15/2023   MPG 128.37 03/15/2023   No results found for: PROLACTIN Lab Results  Component Value Date   CHOL 197 08/05/2021   TRIG 50 08/05/2021   HDL 59 08/05/2021   CHOLHDL 3.3 08/05/2021   VLDL 17 11/09/2007   LDLCALC 129 (H) 08/05/2021   LDLCALC 165 (H) 11/09/2007    Physical Findings: AIMS:  , ,  ,  ,    CIWA:    COWS:     Musculoskeletal: Strength & Muscle Tone: within normal limits Gait & Station: normal Patient leans: N/A  Psychiatric Specialty Exam:  Presentation  General Appearance:  Appropriate for Environment; Fairly Groomed  Eye Contact: Good  Speech: Clear and Coherent; Normal Rate  Speech Volume: Normal  Handedness: Right   Mood and Affect  Mood: Anxious  Affect: Congruent; Flat   Thought Process  Thought Processes: Coherent; Goal Directed  Descriptions of Associations:Intact  Orientation:Full (Time, Place and Person)  Thought Content:Logical; WDL  History of Schizophrenia/Schizoaffective disorder:No  Duration of Psychotic Symptoms:N/A  Hallucinations:Hallucinations: None Description of Auditory Hallucinations: denies  Ideas of Reference:None  Suicidal Thoughts:Suicidal Thoughts: No SI Active Intent and/or Plan: -- (denies) SI Passive Intent and/or Plan: -- (denies)  Homicidal Thoughts:Homicidal Thoughts: No   Sensorium  Memory: Immediate Good; Recent Good; Remote Fair  Judgment: Fair  Insight: Fair   Art Therapist  Concentration: Fair  Attention Span: Fair  Recall: Good  Fund of Knowledge: Good  Language: Good   Psychomotor Activity  Psychomotor  Activity: Psychomotor Activity: Normal   Assets  Assets: Housing; Health And Safety Inspector; Manufacturing Systems Engineer; Social Support; Desire for Improvement   Sleep  Sleep: Sleep: Good Number of Hours of Sleep: 7    Physical Exam: Physical Exam Vitals and nursing note reviewed.  Constitutional:      Appearance: Normal appearance.  HENT:     Head: Normocephalic.  Pulmonary:     Effort: Pulmonary effort is normal.  Musculoskeletal:        General: Normal range of motion.     Cervical back: Normal range of motion.  Neurological:     General: No focal deficit present.     Mental Status: She is alert and oriented to person, place, and time. Mental status is at baseline.  Psychiatric:        Attention and Perception: Attention and perception normal.        Mood and Affect: Mood is anxious. Affect is flat.        Speech: Speech normal.        Behavior: Behavior normal. Behavior is cooperative.        Thought Content: Thought content normal.        Cognition and Memory: Cognition and memory normal.        Judgment: Judgment normal.    Review of Systems  Musculoskeletal:  Positive for joint pain.  Psychiatric/Behavioral:  The patient is nervous/anxious.   All other systems reviewed and are negative.  Blood pressure (!) 144/89, pulse 84, temperature 98.2 F (36.8 C), resp. rate 20, height 5' 2 (1.575 m), weight 93.4 kg, last menstrual period 03/10/2023, SpO2 100%. Body mass index is 37.68 kg/m.   Treatment Plan Summary: Daily contact with patient to assess and evaluate symptoms and  progress in treatment and Medication management   Expand All Collapse All   Depression Symptoms:  insomnia, fatigue, feelings of worthlessness/guilt, difficulty concentrating, hopelessness, impaired memory, suicidal thoughts with specific plan, anxiety, loss of energy/fatigue, disturbed sleep, weight loss, (Hypo) Manic Symptoms:  Impulsivity, Anxiety Symptoms:  Excessive  Worry, Psychotic Symptoms:  Hallucinations: Auditory Duration of Psychotic Symptoms: Less than six months   PTSD Symptoms: Negative Total Time spent with patient: 2.5 hours   Past Psychiatric History: MDD   Is the patient at risk to self? Yes.    Has the patient been a risk to self in the past 6 months? Yes.    Has the patient been a risk to self within the distant past? No.  Is the patient a risk to others? No.  Has the patient been a risk to others in the past 6 months? No.  Has the patient been a risk to others within the distant past? No.    Columbia Scale:  Flowsheet Row Admission (Current) from 03/16/2023 in [facility] ED from 03/15/2023 in [facility] ED from 03/12/2023 in [facility] RISK CATEGORY No Risk Moderate Risk No Risk           Prior Inpatient Therapy: Yes.   If yes, describe 2020  Prior Outpatient Therapy: No. If yes, describe not since 2020    Alcohol Screening: 1. How often do you have a drink containing alcohol?: Never 2. How many drinks containing alcohol do you have on a typical day when you are drinking?: 1 or 2 3. How often do you have six or more drinks on one occasion?: Never AUDIT-C Score: 0 4. How often during the last year have you found that you were not able to stop drinking once you had started?: Never 5. How often during the last year have you failed to do what was normally expected from you because of drinking?: Never 6. How often during the last year have you needed a first drink in the morning to get yourself going after a heavy drinking session?: Never 7. How often during the last year have you had a feeling of guilt of remorse after drinking?: Never 8. How often during the last year have you been unable to remember what happened the night before because you had been drinking?: Never 9. Have you or someone else been injured as a result of your drinking?: No 10.  Has a relative or friend or a doctor or another health worker been concerned about your drinking or suggested you cut down?: No Alcohol Use Disorder Identification Test Final Score (AUDIT): 0 Alcohol Brief Interventions/Follow-up: Alcohol education/Brief advice Substance Abuse History in the last 12 months:  No. Consequences of Substance Abuse: Negative Previous Psychotropic Medications: Yes  Psychological Evaluations: No  Past Medical History:      Past Medical History:  Diagnosis Date   DELAYED MENSES 02/11/2010    Qualifier: Diagnosis of  By: Reynaldo  MD, Khary     Hypertension     MIGRAINE HEADACHE 12/13/2007   Urinary tract infection 03/04/2011             Past Surgical History:  Procedure Laterality Date   NO PAST SURGERIES            Family History:       Family History  Problem Relation Age of Onset   Healthy Mother     Sickle cell trait Sister  Family Psychiatric  History: none reported Tobacco Screening:  Tobacco Use History  Social History        Tobacco Use  Smoking Status Never   Passive exposure: Never  Smokeless Tobacco Never      BH Tobacco Counseling       Are you interested in Tobacco Cessation Medications?  N/A, patient does not use tobacco products Counseled patient on smoking cessation:  N/A, patient does not use tobacco products Reason Tobacco Screening Not Completed: No value filed.           Social History:  Social History       Substance and Sexual Activity  Alcohol Use No     Social History       Substance and Sexual Activity  Drug Use No    Social History         Socioeconomic History   Marital status: Single      Spouse name: Not on file   Number of children: Not on file   Years of education: Not on file   Highest education level: Not on file  Occupational History   Not on file  Tobacco Use   Smoking status: Never      Passive exposure: Never   Smokeless tobacco: Never  Vaping Use   Vaping status:  Never Used  Substance and Sexual Activity   Alcohol use: No   Drug use: No   Sexual activity: Yes      Birth control/protection: Condom  Other Topics Concern   Not on file  Social History Narrative   Not on file    Social Drivers of Health        Financial Resource Strain: Not on file  Food Insecurity: No Food Insecurity (03/16/2023)    Hunger Vital Sign     Worried About Running Out of Food in the Last Year: Never true     Ran Out of Food in the Last Year: Never true  Transportation Needs: No Transportation Needs (03/16/2023)    PRAPARE - Therapist, Art (Medical): No     Lack of Transportation (Non-Medical): No  Physical Activity: Not on file  Stress: Not on file  Social Connections: Not on file    Additional Social History:        Developmental History: Prenatal History: Birth History: Postnatal Infancy: Developmental History: Milestones: Sit-Up: Crawl: Walk: Speech: School History:  Education Status Highest grade of school patient has completed: 10th. Legal History: Hobbies/Interests:Allergies:   Allergies  No Known Allergies     Lab Results:  Lab Results Last 48 Hours        Results for orders placed or performed during the hospital encounter of 03/15/23 (from the past 48 hours)  Urinalysis, Complete w Microscopic -Urine, Clean Catch     Status: Abnormal    Collection Time: 03/15/23 10:00 AM  Result Value Ref Range    Color, Urine YELLOW YELLOW    APPearance HAZY (A) CLEAR    Specific Gravity, Urine 1.019 1.005 - 1.030    pH 6.0 5.0 - 8.0    Glucose, UA NEGATIVE NEGATIVE mg/dL    Hgb urine dipstick LARGE (A) NEGATIVE    Bilirubin Urine NEGATIVE NEGATIVE    Ketones, ur NEGATIVE NEGATIVE mg/dL    Protein, ur NEGATIVE NEGATIVE mg/dL    Nitrite NEGATIVE NEGATIVE    Leukocytes,Ua NEGATIVE NEGATIVE    RBC / HPF 0-5 0 - 5 RBC/hpf    WBC, UA  0-5 0 - 5 WBC/hpf    Bacteria, UA NONE SEEN NONE SEEN    Squamous Epithelial / HPF 0-5  0 - 5 /HPF    Mucus PRESENT      Ca Oxalate Crys, UA PRESENT        Comment: Performed at [facility], [address] [place], [place] [address]  POC urine preg, ED     Status: Normal    Collection Time: 03/15/23 10:03 AM  Result Value Ref Range    Preg Test, Ur Negative Negative  POCT Urine Drug Screen - (I-Screen)     Status: Normal    Collection Time: 03/15/23 10:03 AM  Result Value Ref Range    POC Amphetamine UR None Detected NONE DETECTED (Cut Off Level 1000 ng/mL)    POC Secobarbital (BAR) None Detected NONE DETECTED (Cut Off Level 300 ng/mL)    POC Buprenorphine (BUP) None Detected NONE DETECTED (Cut Off Level 10 ng/mL)    POC Oxazepam (BZO) None Detected NONE DETECTED (Cut Off Level 300 ng/mL)    POC Cocaine UR None Detected NONE DETECTED (Cut Off Level 300 ng/mL)    POC Methamphetamine UR None Detected NONE DETECTED (Cut Off Level 1000 ng/mL)    POC Morphine None Detected NONE DETECTED (Cut Off Level 300 ng/mL)    POC Methadone UR None Detected NONE DETECTED (Cut Off Level 300 ng/mL)    POC Oxycodone  UR None Detected NONE DETECTED (Cut Off Level 100 ng/mL)    POC Marijuana UR None Detected NONE DETECTED (Cut Off Level 50 ng/mL)  CBC with Differential/Platelet     Status: Abnormal    Collection Time: 03/15/23 10:05 AM  Result Value Ref Range    WBC 7.0 4.0 - 10.5 K/uL    RBC 4.93 3.87 - 5.11 MIL/uL    Hemoglobin 12.1 12.0 - 15.0 g/dL    HCT 59.7 63.9 - 53.9 %    MCV 81.5 80.0 - 100.0 fL    MCH 24.5 (L) 26.0 - 34.0 pg    MCHC 30.1 30.0 - 36.0 g/dL    RDW 85.4 88.4 - 84.4 %    Platelets 330 150 - 400 K/uL    nRBC 0.0 0.0 - 0.2 %    Neutrophils Relative % 75 %    Neutro Abs 5.2 1.7 - 7.7 K/uL    Lymphocytes Relative 17 %    Lymphs Abs 1.2 0.7 - 4.0 K/uL    Monocytes Relative 7 %    Monocytes Absolute 0.5 0.1 - 1.0 K/uL    Eosinophils Relative 0 %    Eosinophils Absolute 0.0 0.0 - 0.5 K/uL    Basophils Relative 1 %    Basophils Absolute 0.0 0.0 - 0.1 K/uL     Immature Granulocytes 0 %    Abs Immature Granulocytes 0.01 0.00 - 0.07 K/uL      Comment: Performed at [facility], [address], [place], [place] [address]  Comprehensive metabolic panel     Status: Abnormal    Collection Time: 03/15/23 10:05 AM  Result Value Ref Range    Sodium 138 135 - 145 mmol/L    Potassium 2.6 (LL) 3.5 - 5.1 mmol/L      Comment: CRITICAL RESULT CALLED TO, READ BACK BY AND VERIFIED WITH APRIL COLEMAN RN @1330  02.04.2025 E.AHMED    Chloride 99 98 - 111 mmol/L    CO2 27 22 - 32 mmol/L    Glucose, Bld 117 (H) 70 - 99  mg/dL      Comment: Glucose reference range applies only to samples taken after fasting for at least 8 hours.    BUN 7 6 - 20 mg/dL    Creatinine, Ser 9.13 0.44 - 1.00 mg/dL    Calcium 9.5 8.9 - 89.6 mg/dL    Total Protein 9.0 (H) 6.5 - 8.1 g/dL    Albumin 4.0 3.5 - 5.0 g/dL    AST 17 15 - 41 U/L    ALT 15 0 - 44 U/L    Alkaline Phosphatase 73 38 - 126 U/L    Total Bilirubin 0.3 0.0 - 1.2 mg/dL    GFR, Estimated >39 >39 mL/min      Comment: (NOTE) Calculated using the CKD-EPI Creatinine Equation (2021)      Anion gap 12 5 - 15      Comment: Performed at [facility], [address], [place], [place] [address]  Hemoglobin A1c     Status: Abnormal    Collection Time: 03/15/23 10:05 AM  Result Value Ref Range    Hgb A1c MFr Bld 6.1 (H) 4.8 - 5.6 %      Comment: (NOTE) Pre diabetes:          5.7%-6.4%   Diabetes:              >6.4%   Glycemic control for   <7.0% adults with diabetes      Mean Plasma Glucose 128.37 mg/dL      Comment: Performed at [facility], [address], [place], [place] [address]  Magnesium      Status: None    Collection Time: 03/15/23 10:05 AM  Result Value Ref Range    Magnesium  2.0 1.7 - 2.4 mg/dL      Comment: Performed at [facility], [address], [place], [place] [address]  Ethanol     Status: None    Collection Time: 03/15/23 10:05 AM  Result Value Ref Range    Alcohol, Ethyl  (B) <10 <10 mg/dL      Comment: (NOTE) Lowest detectable limit for serum alcohol is 10 mg/dL.   For medical purposes only. Performed at [facility], [address], [place], [place] [address]    TSH     Status: None    Collection Time: 03/15/23 10:05 AM  Result Value Ref Range    TSH 0.803 0.350 - 4.500 uIU/mL      Comment: Performed by a 3rd Generation assay with a functional sensitivity of <=0.01 uIU/mL. Performed at [facility], [address], [place], [place] [address]    Vitamin B12     Status: None    Collection Time: 03/15/23 10:05 AM  Result Value Ref Range    Vitamin B-12 535 180 - 914 pg/mL      Comment: (NOTE) This assay is not validated for testing neonatal or myeloproliferative syndrome specimens for Vitamin B12 levels. Performed at [facility], [address], [place], [place] [address]    Potassium     Status: Abnormal    Collection Time: 03/15/23  6:26 PM  Result Value Ref Range    Potassium 3.4 (L) 3.5 - 5.1 mmol/L      Comment: Performed at [facility], [address] [place], [place] [address]        Blood Alcohol level:  Recent Labs       Lab Results  Component Value Date  ETH <10 03/15/2023    ETH <10 08/14/2018        Metabolic Disorder Labs:  Recent Labs       Lab Results  Component Value Date    HGBA1C 6.1 (H) 03/15/2023    MPG 128.37 03/15/2023      Recent Labs  No results found for: PROLACTIN   Recent Labs       Lab Results  Component Value Date    CHOL 197 08/05/2021    TRIG 50 08/05/2021    HDL 59 08/05/2021    CHOLHDL 3.3 08/05/2021    VLDL 17 11/09/2007    LDLCALC 129 (H) 08/05/2021    LDLCALC 165 (H) 11/09/2007        Current Medications:          Current Facility-Administered Medications  Medication Dose Route Frequency Provider Last Rate Last Admin   acetaminophen  (TYLENOL ) tablet 650 mg  650 mg Oral Q6H PRN Coleman, Carolyn H, NP  650 mg at 03/16/23 1821   alum & mag  hydroxide-simeth (MAALOX/MYLANTA) 200-200-20 MG/5ML suspension 30 mL  30 mL Oral Q4H PRN Mardy Elveria DEL, NP     amLODipine  (NORVASC ) tablet 10 mg  10 mg Oral Daily Coleman, Carolyn H, NP  10 mg at 03/16/23 0642   ARIPiprazole  (ABILIFY ) tablet 5 mg  5 mg Oral Daily Mardy Elveria DEL, NP  5 mg at 03/16/23 9166   butalbital -acetaminophen -caffeine  (FIORICET) 50-325-40 MG per tablet 1 tablet  1 tablet Oral Q6H PRN Jhonny Sahara B, MD     haloperidol  (HALDOL ) tablet 5 mg  5 mg Oral TID PRN Mardy Elveria DEL, NP  5 mg at 03/16/23 1047    And   diphenhydrAMINE  (BENADRYL ) capsule 50 mg  50 mg Oral TID PRN Coleman, Carolyn H, NP  50 mg at 03/16/23 1047   haloperidol  lactate (HALDOL ) injection 5 mg  5 mg Intramuscular TID PRN Mardy Elveria DEL, NP      And   diphenhydrAMINE  (BENADRYL ) injection 50 mg  50 mg Intramuscular TID PRN Mardy Elveria DEL, NP      And   LORazepam  (ATIVAN ) injection 2 mg  2 mg Intramuscular TID PRN Coleman, Carolyn H, NP     haloperidol  lactate (HALDOL ) injection 10 mg  10 mg Intramuscular TID PRN Mardy Elveria DEL, NP      And   diphenhydrAMINE  (BENADRYL ) injection 50 mg  50 mg Intramuscular TID PRN Mardy Elveria DEL, NP      And   LORazepam  (ATIVAN ) injection 2 mg  2 mg Intramuscular TID PRN Coleman, Carolyn H, NP     hydrochlorothiazide  (HYDRODIURIL ) tablet 25 mg  25 mg Oral Daily Coleman, Carolyn H, NP  25 mg at 03/16/23 9357   hydrOXYzine  (ATARAX ) tablet 25 mg  25 mg Oral TID PRN Coleman, Carolyn H, NP  25 mg at 03/16/23 9374   ibuprofen  (ADVIL ) tablet 400 mg  400 mg Oral Q6H PRN Sreenath, Sudheer B, MD     magnesium  hydroxide (MILK OF MAGNESIA) suspension 30 mL  30 mL Oral Daily PRN Mardy Elveria DEL, NP     methocarbamol  (ROBAXIN ) tablet 500 mg  500 mg Oral BID Coleman, Carolyn H, NP  500 mg at 03/16/23 1740   sertraline  (ZOLOFT ) tablet 25 mg  25 mg Oral Daily Coleman, Carolyn H, NP  25 mg at 03/16/23 9166   traZODone  (DESYREL ) tablet 50 mg  50 mg Oral QHS  PRN Mardy Elveria DEL, NP  PTA Medications:        Medications Prior to Admission  Medication Sig Dispense Refill Last Dose/Taking   acetaminophen  (TYLENOL ) 500 MG tablet Take 500 mg by mouth every 6 (six) hours as needed for mild pain (pain score 1-3), moderate pain (pain score 4-6), fever or headache.         amLODipine  (NORVASC ) 10 MG tablet TAKE 1 TABLET(10 MG) BY MOUTH DAILY 90 tablet 3     ARIPiprazole  (ABILIFY ) 5 MG tablet Take 1 tablet (5 mg total) by mouth daily.         hydrochlorothiazide  (HYDRODIURIL ) 25 MG tablet Take 1 tablet (25 mg total) by mouth daily. 90 tablet 3     methocarbamol  (ROBAXIN ) 500 MG tablet Take 1 tablet (500 mg total) by mouth 2 (two) times daily. 20 tablet 0     sertraline  (ZOLOFT ) 25 MG tablet Take 1 tablet (25 mg total) by mouth daily. (Patient not taking: Reported on 03/15/2023) 45 tablet 0            Musculoskeletal: Strength & Muscle Tone: within normal limits Gait & Station: normal Patient leans: N/A                         Psychiatric Specialty Exam:   Presentation  General Appearance:  Appropriate for Environment; Fairly Groomed   Eye Contact: Minimal (She is cooperative and attentive during the interview, maintaining appropriate eye contact.)   Speech: Clear and Coherent; Normal Rate   Speech Volume: Normal   Handedness: Right     Mood and Affect  Mood: Depressed; Hopeless; Labile   Affect: Tearful     Thought Process  Thought Processes: Goal Directed; Coherent   Descriptions of Associations:Intact   Orientation:Full (Time, Place and Person)   Thought Content:WDL (with self-deprecating)   History of Schizophrenia/Schizoaffective disorder:No   Duration of Psychotic Symptoms: 2 weeks Hallucinations:Hallucinations: Auditory Description of Auditory Hallucinations: a man is talking to me   Ideas of Reference:None   Suicidal Thoughts:Suicidal Thoughts: Yes, Passive SI Active Intent and/or  Plan: Without Plan; Without Intent; Without Access to Means; Without Means to Carry Out SI Passive Intent and/or Plan: Without Intent; Without Means to Carry Out; Without Plan; Without Access to Means   Homicidal Thoughts:Homicidal Thoughts: No     Sensorium  Memory: Immediate Fair; Recent Fair; Remote Fair   Judgment: Poor (as evidenced by her inability to ensure personal safety and her consideration of suicidal actions.)   Insight: Lacking (into the severity of her condition, acknowledging her symptoms but not fully recognizing the need for consistent treatment.)     Executive Functions  Concentration: Fair   Attention Span: Fair   Recall: Fair   Fund of Knowledge: Good   Language: Good     Psychomotor Activity  Psychomotor Activity: Psychomotor Activity: Normal     Assets  Assets: Housing; Manufacturing Systems Engineer; Social Support     Sleep  Sleep: Sleep: Fair Number of Hours of Sleep: 6       Physical Exam: Physical Exam Vitals and nursing note reviewed.  Constitutional:      Appearance: Normal appearance.  HENT:     Head: Normocephalic and atraumatic.     Nose: Nose normal.  Pulmonary:     Effort: Pulmonary effort is normal.  Musculoskeletal:        General: Normal range of motion.     Cervical back: Normal range of motion.  Neurological:     General:  No focal deficit present.     Mental Status: She is alert and oriented to person, place, and time. Mental status is at baseline.  Psychiatric:        Attention and Perception: Attention normal. She perceives auditory hallucinations.        Mood and Affect: Mood is anxious and depressed. Affect is flat.        Speech: Speech normal.        Behavior: Behavior is withdrawn. Behavior is cooperative.        Thought Content: Thought content normal.        Cognition and Memory: Cognition and memory normal.        Judgment: Judgment is impulsive.     Comments: Pacing      Review of Systems   Musculoskeletal:  Positive for neck pain.  Neurological:  Positive for headaches.  Psychiatric/Behavioral:  Positive for depression, hallucinations and suicidal ideas. The patient is nervous/anxious and has insomnia.   All other systems reviewed and are negative.   Blood pressure (!) 144/91, pulse 77, temperature 97.9 F (36.6 C), resp. rate 17, height 5' 2 (1.575 m), weight 93.4 kg, last menstrual period 03/10/2023, SpO2 100%. Body mass index is 37.68 kg/m.     Treatment Plan Summary: Daily contact with patient to assess and evaluate symptoms and progress in treatment and Medication management Aripiprazole  5 mg orally daily to address psychotic symptoms and augment antidepressant therapy. Initiate sertraline  50 mg orally daily for depressive symptoms. Administer potassium chloride  40 mEq orally as a single dose to address any potential hypokalemia. Fioricet 1 tablet every 6 hours as needed. Engage the patient in individual psychotherapy sessions to explore underlying issues contributing to her depression and to develop coping strategies. Provide psychoeducation regarding her diagnosis, the importance of medication adherence, and the benefits of engaging in therapeutic activities.     Brad GORMAN Moats, NP 03/18/2023, 12:26 PM

## 2023-03-17 NOTE — Group Note (Signed)
 Date:  03/17/2023 Time:  10:28 AM  Group Topic/Focus:   Goals Group:   The focus of this group is to help patients establish daily goals to achieve during treatment and discuss how the patient can incorporate goal setting into their daily lives to aide in recovery  Overcoming Stress:   The focus of this group is to define stress and help patients assess their triggers.   Participation Level:  Active  Participation Quality:  Appropriate and Attentive  Affect:  Appropriate  Cognitive:  Alert and Appropriate  Insight: Appropriate  Engagement in Group:  Engaged  Modes of Intervention:  Activity and Education  Additional Comments:    Cletus Mehlhoff A Calleigh Lafontant 03/17/2023, 10:28 AM

## 2023-03-17 NOTE — Progress Notes (Signed)
 Pt C/O muscle cramping in upper thighs.  Provider notified.

## 2023-03-17 NOTE — Plan of Care (Signed)
   Problem: Education: Goal: Verbalization of understanding the information provided will improve Outcome: Progressing

## 2023-03-17 NOTE — Group Note (Signed)
 Date:  03/17/2023 Time:  10:23 PM  Group Topic/Focus:  Personal Choices and Values:   The focus of this group is to help patients assess and explore the importance of values in their lives, how their values affect their decisions, how they express their values and what opposes their expression.    Participation Level:  Active  Participation Quality:  Appropriate  Affect:  Appropriate  Cognitive:  Appropriate  Insight: Appropriate  Engagement in Group:  Engaged  Modes of Intervention:  Education  Additional Comments:  Patient was able to identify and discuss triggers and found new ways of coping when triggered.   Chen Saadeh L 03/17/2023, 10:23 PM

## 2023-03-17 NOTE — Plan of Care (Signed)
  Problem: Education: Goal: Emotional status will improve Outcome: Progressing Goal: Mental status will improve Outcome: Progressing   Problem: Activity: Goal: Interest or engagement in activities will improve Outcome: Progressing Goal: Sleeping patterns will improve Outcome: Progressing   Problem: Coping: Goal: Ability to demonstrate self-control will improve Outcome: Progressing   Problem: Physical Regulation: Goal: Ability to maintain clinical measurements within normal limits will improve Outcome: Progressing

## 2023-03-17 NOTE — Group Note (Signed)
 LCSW Group Therapy Note  Group Date: 03/17/2023 Start Time: 1300 End Time: 1400   Type of Therapy and Topic:  Group Therapy: Anger Cues and Responses  Participation Level:  None   Description of Group:   In this group, patients learned how to recognize the physical, cognitive, emotional, and behavioral responses they have to anger-provoking situations.  They identified a recent time they became angry and how they reacted.  They analyzed how their reaction was possibly beneficial and how it was possibly unhelpful.  The group discussed a variety of healthier coping skills that could help with such a situation in the future.  Focus was placed on how helpful it is to recognize the underlying emotions to our anger, because working on those can lead to a more permanent solution as well as our ability to focus on the important rather than the urgent.  Therapeutic Goals: Patients will remember their last incident of anger and how they felt emotionally and physically, what their thoughts were at the time, and how they behaved. Patients will identify how their behavior at that time worked for them, as well as how it worked against them. Patients will explore possible new behaviors to use in future anger situations. Patients will learn that anger itself is normal and cannot be eliminated, and that healthier reactions can assist with resolving conflict rather than worsening situations.  Summary of Patient Progress:   Patient ws present in group and appeared attentive.  However, patient did not engage in group discussions.    Therapeutic Modalities:   Cognitive Behavioral Therapy    Sherryle JINNY Margo, LCSWA 03/17/2023  2:18 PM

## 2023-03-17 NOTE — Progress Notes (Signed)
 Pt calm and pleasant during assessment denying SI/HI/AVH. Pt observed by this Clinical research associate interacting appropriately with staff and peers on the unit. Pt compliant with medication administration per MD orders. Pt given education, support, and encouragement to be active in her treatment plan. Pt being monitored Q 15 minutes for safety per unit protocol, remains safe on the unit

## 2023-03-17 NOTE — Progress Notes (Signed)
   03/17/23 1500  Psych Admission Type (Psych Patients Only)  Admission Status Voluntary  Psychosocial Assessment  Patient Complaints Anxiety  Eye Contact Fair  Facial Expression Anxious  Affect Anxious  Speech Logical/coherent  Interaction Assertive  Motor Activity Slow  Appearance/Hygiene Unremarkable  Behavior Characteristics Cooperative  Mood Preoccupied  Thought Process  Coherency WDL  Content Preoccupation  Delusions None reported or observed  Perception WDL  Hallucination None reported or observed  Judgment WDL  Confusion None  Danger to Self  Current suicidal ideation? Denies  Danger to Others  Danger to Others None reported or observed

## 2023-03-17 NOTE — Group Note (Signed)
 Recreation Therapy Group Note   Group Topic:Self-Esteem  Group Date: 03/17/2023 Start Time: 1000 End Time: 1055 Facilitators: Celestia Jeoffrey BRAVO, LRT, CTRS Location:  [facility]  Group Description: Positive Affirmation Worksheet. Patients and LRT discussed the importance of self-love/self-esteem and things that cause it to fluctuate, including our mental health. Pts received a large index card and marker. Pts were prompted to write their first name on the card and then pass the card to their peer on the right. Pts were then encouraged to write a positive affirmation or give a compliment to that person. Pt's passed around cards until everyone was able to write one for each person. LRT then collected the index cards and handed them back to the original person. Pts then completed a worksheet that helps them identify 24 different strengths and qualities about themselves. Pt encouraged to read aloud at least 2 off their sheet to the group. LRT and pts discussed how this can be applied to daily life post-discharge.   Goal Area(s) Addressed: Patient will identify positive qualities about themselves. Patient will learn new positive affirmations.  Patient will recite positive qualities and affirmations aloud to the group.  Patient will practice positive self-talk.  Patient will increase communication.   Affect/Mood: Appropriate   Participation Level: Active and Engaged   Participation Quality: Independent   Behavior: Appropriate, Calm, and Cooperative   Speech/Thought Process: Coherent   Insight: Good   Judgement: Good   Modes of Intervention: Clarification, Education, Guided Discussion, Support, and Worksheet   Patient Response to Interventions:  Attentive, Engaged, and Receptive   Education Outcome:  Acknowledges education   Clinical Observations/Individualized Feedback: Chasey was active in their participation of session activities and group discussion. Pt identified I like myself  because I am strong. What I really enjoy the most are my kids. I like to be around other people. Pt spontaneously contributed to group discussion while interacting well with LRT and peers duration of session.    Plan: Continue to engage patient in RT group sessions 2-3x/week.   Jeoffrey BRAVO Celestia, LRT, CTRS 03/17/2023 1:16 PM

## 2023-03-18 DIAGNOSIS — F333 Major depressive disorder, recurrent, severe with psychotic symptoms: Secondary | ICD-10-CM | POA: Diagnosis not present

## 2023-03-18 LAB — GLUCOSE, CAPILLARY: Glucose-Capillary: 116 mg/dL — ABNORMAL HIGH (ref 70–99)

## 2023-03-18 MED ORDER — MELATONIN 5 MG PO TABS
5.0000 mg | ORAL_TABLET | Freq: Every day | ORAL | Status: DC
Start: 2023-03-18 — End: 2023-03-20
  Administered 2023-03-18 – 2023-03-19 (×2): 5 mg via ORAL
  Filled 2023-03-18 (×2): qty 1

## 2023-03-18 NOTE — Group Note (Signed)
 Recreation Therapy Group Note   Group Topic:Leisure Education  Group Date: 03/18/2023 Start Time: 1000 End Time: 1100 Facilitators: Celestia Jeoffrey BRAVO, LRT, CTRS Location:  C[facility] Room  Group Description: Leisure. Patients were given the option to choose from singing karaoke, coloring mandalas, using oil pastels, journaling, or playing with play-doh. LRT and pts discussed the meaning of leisure, the importance of participating in leisure during their free time/when they're outside of the hospital, as well as how our leisure interests can also serve as coping skills.   Goal Area(s) Addressed:  Patient will identify a current leisure interest.  Patient will learn the definition of "leisure". Patient will practice making a positive decision. Patient will have the opportunity to try a new leisure activity. Patient will communicate with peers and LRT.    Affect/Mood: Appropriate   Participation Level: Active and Engaged   Participation Quality: Independent   Behavior: Calm and Cooperative   Speech/Thought Process: Coherent   Insight: Fair   Judgement: Fair    Modes of Intervention: Activity, Education, Exploration, Music, and Rapport Building   Patient Response to Interventions:  Attentive, Engaged, and Receptive   Education Outcome:  Acknowledges education   Clinical Observations/Individualized Feedback: Kendra Harris was active in their participation of session activities and group discussion. Pt identified go for a walk and sing as things she does in her free time. Pt chose to listen to music while in group. Pt interacted well with LRT and peers duration of session.    Plan: Continue to engage patient in RT group sessions 2-3x/week.   Jeoffrey BRAVO Celestia, LRT, CTRS 03/18/2023 12:27 PM

## 2023-03-18 NOTE — Plan of Care (Signed)
   Problem: Education: Goal: Emotional status will improve Outcome: Progressing Goal: Mental status will improve Outcome: Progressing

## 2023-03-18 NOTE — Plan of Care (Signed)

## 2023-03-18 NOTE — Group Note (Signed)
 Date:  03/18/2023 Time:  9:16 PM  Group Topic/Focus:  Managing Feelings:   The focus of this group is to identify what feelings patients have difficulty handling and develop a plan to handle them in a healthier way upon discharge.    Participation Level:  Active  Participation Quality:  Appropriate  Affect:  Appropriate  Cognitive:  Appropriate  Insight: Appropriate  Engagement in Group:  Engaged  Modes of Intervention:  Education  Additional Comments:  Patient presented within normal limits and was an active support for peers during group. Patient was grateful for the support of the group while here.  Kazue Cerro L 03/18/2023, 9:16 PM

## 2023-03-18 NOTE — Progress Notes (Signed)
   03/18/23 1600  Psych Admission Type (Psych Patients Only)  Admission Status Voluntary  Psychosocial Assessment  Patient Complaints Anxiety  Eye Contact Fair  Facial Expression Anxious  Affect Anxious  Speech Logical/coherent  Interaction Assertive  Motor Activity Slow  Appearance/Hygiene Unremarkable  Behavior Characteristics Cooperative  Mood Preoccupied  Thought Process  Coherency WDL  Content Preoccupation  Delusions None reported or observed  Perception WDL  Hallucination None reported or observed  Judgment WDL  Confusion None  Danger to Self  Current suicidal ideation? Denies (Denies)  Description of Suicide Plan None  Agreement Not to Harm Self Yes  Description of Agreement Verbal  Danger to Others  Danger to Others None reported or observed

## 2023-03-18 NOTE — Group Note (Signed)
 Date:  03/18/2023 Time:  10:31 AM  Group Topic/Focus:  Goals Group:   The focus of this group is to help patients establish daily goals to achieve during treatment and discuss how the patient can incorporate goal setting into their daily lives to aide in recovery.    Participation Level:  Active  Participation Quality:  Appropriate  Affect:  Appropriate  Cognitive:  Appropriate  Insight: Appropriate  Engagement in Group:  Engaged  Modes of Intervention:  Discussion, Education, and Support  Additional Comments:    Deitra Caron Mainland 03/18/2023, 10:31 AM

## 2023-03-18 NOTE — Group Note (Signed)
 Date:  03/18/2023 Time:  4:49 PM  Group Topic/Focus:  Outdoor Recreation    Participation Level:  Active  Participation Quality:  Appropriate  Affect:  Appropriate  Cognitive:  Appropriate  Insight: Appropriate  Engagement in Group:  Engaged  Modes of Intervention:  Activity  Additional Comments:    Deitra Caron Mainland 03/18/2023, 4:49 PM

## 2023-03-18 NOTE — Progress Notes (Signed)
 [facility] MD Progress Note  03/19/2023 5:33 PM Kendra Harris  MRN:  [id number] Subjective:  43 year old African American female, inquired about her discharge timeline, asking, When can I leave? During the discussion, she acknowledged the progress made and the effectiveness of her current medication regimen. However, she expressed feelings of anxiety regarding her discharge.The patient is experiencing anxiety related to the uncertainty of her discharge date, despite acknowledging the effectiveness of her current medication and the progress made during treatment. Her anxiety may stem from concerns about transitioning from the structured environment of inpatient care to independent living. Principal Problem: Severe recurrent depression with psychosis (HCC) Diagnosis: Principal Problem:   Severe recurrent depression with psychosis (HCC) Active Problems:   Essential hypertension, benign  Total Time spent with patient: 1 hour  Past Psychiatric History: Depression  Past Medical History:  Past Medical History:  Diagnosis Date   DELAYED MENSES 02/11/2010   Qualifier: Diagnosis of  By: Reynaldo  MD, Khary     Hypertension    MIGRAINE HEADACHE 12/13/2007   Urinary tract infection 03/04/2011    Past Surgical History:  Procedure Laterality Date   NO PAST SURGERIES     Family History:  Family History  Problem Relation Age of Onset   Healthy Mother    Sickle cell trait Sister    Family Psychiatric  History: none reported Social History:  Social History   Substance and Sexual Activity  Alcohol Use No     Social History   Substance and Sexual Activity  Drug Use No    Social History   Socioeconomic History   Marital status: Single    Spouse name: Not on file   Number of children: Not on file   Years of education: Not on file   Highest education level: Not on file  Occupational History   Not on file  Tobacco Use   Smoking status: Never    Passive exposure: Never   Smokeless  tobacco: Never  Vaping Use   Vaping status: Never Used  Substance and Sexual Activity   Alcohol use: No   Drug use: No   Sexual activity: Yes    Birth control/protection: Condom  Other Topics Concern   Not on file  Social History Narrative   Not on file   Social Drivers of Health   Financial Resource Strain: Not on file  Food Insecurity: No Food Insecurity (03/16/2023)   Hunger Vital Sign    Worried About Running Out of Food in the Last Year: Never true    Ran Out of Food in the Last Year: Never true  Transportation Needs: No Transportation Needs (03/16/2023)   PRAPARE - Administrator, Civil Service (Medical): No    Lack of Transportation (Non-Medical): No  Physical Activity: Not on file  Stress: Not on file  Social Connections: Not on file   Additional Social History:                         Sleep: Good  Appetite:  Good  Current Medications: Current Facility-Administered Medications  Medication Dose Route Frequency Provider Last Rate Last Admin   acetaminophen  (TYLENOL ) tablet 650 mg  650 mg Oral Q6H PRN Coleman, Carolyn H, NP   650 mg at 03/19/23 1544   alum & mag hydroxide-simeth (MAALOX/MYLANTA) 200-200-20 MG/5ML suspension 30 mL  30 mL Oral Q4H PRN Coleman, Carolyn H, NP       amLODipine  (NORVASC ) tablet 10 mg  10  mg Oral Daily Coleman, Carolyn H, NP   10 mg at 03/19/23 0840   ARIPiprazole  (ABILIFY ) tablet 5 mg  5 mg Oral Daily Coleman, Carolyn H, NP   5 mg at 03/19/23 0840   butalbital -acetaminophen -caffeine  (FIORICET) 50-325-40 MG per tablet 1 tablet  1 tablet Oral Q6H PRN Jhonny Sahara B, MD       haloperidol  (HALDOL ) tablet 5 mg  5 mg Oral TID PRN Mardy Elveria DEL, NP   5 mg at 03/16/23 1047   And   diphenhydrAMINE  (BENADRYL ) capsule 50 mg  50 mg Oral TID PRN Coleman, Carolyn H, NP   50 mg at 03/18/23 2112   haloperidol  lactate (HALDOL ) injection 5 mg  5 mg Intramuscular TID PRN Mardy Elveria DEL, NP       And   diphenhydrAMINE   (BENADRYL ) injection 50 mg  50 mg Intramuscular TID PRN Mardy Elveria DEL, NP       And   LORazepam  (ATIVAN ) injection 2 mg  2 mg Intramuscular TID PRN Mardy Elveria DEL, NP       haloperidol  lactate (HALDOL ) injection 10 mg  10 mg Intramuscular TID PRN Mardy Elveria DEL, NP       And   diphenhydrAMINE  (BENADRYL ) injection 50 mg  50 mg Intramuscular TID PRN Mardy Elveria DEL, NP       And   LORazepam  (ATIVAN ) injection 2 mg  2 mg Intramuscular TID PRN Coleman, Carolyn H, NP       DULoxetine  (CYMBALTA ) DR capsule 20 mg  20 mg Oral Daily Nicholaus Brad RAMAN, NP   20 mg at 03/19/23 0840   hydrochlorothiazide  (HYDRODIURIL ) tablet 25 mg  25 mg Oral Daily Coleman, Carolyn H, NP   25 mg at 03/19/23 0840   hydrOXYzine  (ATARAX ) tablet 25 mg  25 mg Oral TID PRN Coleman, Carolyn H, NP   25 mg at 03/18/23 2112   ibuprofen  (ADVIL ) tablet 400 mg  400 mg Oral Q6H PRN Sreenath, Sudheer B, MD       magnesium  hydroxide (MILK OF MAGNESIA) suspension 30 mL  30 mL Oral Daily PRN Coleman, Carolyn H, NP       melatonin tablet 5 mg  5 mg Oral QHS Sobia Karger S, NP   5 mg at 03/18/23 2112   methocarbamol  (ROBAXIN ) tablet 500 mg  500 mg Oral BID Coleman, Carolyn H, NP   500 mg at 03/19/23 1619   traZODone  (DESYREL ) tablet 50 mg  50 mg Oral QHS PRN Coleman, Carolyn H, NP   50 mg at 03/18/23 2112    Lab Results:  Results for orders placed or performed during the hospital encounter of 03/16/23 (from the past 48 hours)  Glucose, capillary     Status: Abnormal   Collection Time: 03/17/23  9:04 PM  Result Value Ref Range   Glucose-Capillary 206 (H) 70 - 99 mg/dL    Comment: Glucose reference range applies only to samples taken after fasting for at least 8 hours.  Glucose, capillary     Status: Abnormal   Collection Time: 03/18/23  8:22 PM  Result Value Ref Range   Glucose-Capillary 116 (H) 70 - 99 mg/dL    Comment: Glucose reference range applies only to samples taken after fasting for at least 8 hours.  Glucose,  capillary     Status: Abnormal   Collection Time: 03/19/23 11:27 AM  Result Value Ref Range   Glucose-Capillary 104 (H) 70 - 99 mg/dL    Comment: Glucose reference range applies  only to samples taken after fasting for at least 8 hours.  Glucose, capillary     Status: Abnormal   Collection Time: 03/19/23  4:17 PM  Result Value Ref Range   Glucose-Capillary 118 (H) 70 - 99 mg/dL    Comment: Glucose reference range applies only to samples taken after fasting for at least 8 hours.    Blood Alcohol level:  Lab Results  Component Value Date   ETH <10 03/15/2023   ETH <10 08/14/2018    Metabolic Disorder Labs: Lab Results  Component Value Date   HGBA1C 6.1 (H) 03/15/2023   MPG 128.37 03/15/2023   No results found for: PROLACTIN Lab Results  Component Value Date   CHOL 197 08/05/2021   TRIG 50 08/05/2021   HDL 59 08/05/2021   CHOLHDL 3.3 08/05/2021   VLDL 17 11/09/2007   LDLCALC 129 (H) 08/05/2021   LDLCALC 165 (H) 11/09/2007    Physical Findings: AIMS:  , ,  ,  ,    CIWA:    COWS:     Musculoskeletal: Strength & Muscle Tone: within normal limits Gait & Station: normal Patient leans: N/A  Psychiatric Specialty Exam:  Presentation  General Appearance:  Appropriate for Environment; Neat  Eye Contact: Good  Speech: Clear and Coherent; Normal Rate  Speech Volume: Normal  Handedness: Right   Mood and Affect  Mood: Anxious  Affect: Congruent; Appropriate   Thought Process  Thought Processes: Coherent; Goal Directed  Descriptions of Associations:Intact  Orientation:Full (Time, Place and Person)  Thought Content:WDL  History of Schizophrenia/Schizoaffective disorder:No  Duration of Psychotic Symptoms:N/A  Hallucinations:Hallucinations: None Description of Auditory Hallucinations: denies  Ideas of Reference:None  Suicidal Thoughts:Suicidal Thoughts: No SI Active Intent and/or Plan: -- (denies) SI Passive Intent and/or Plan: --  (denies)  Homicidal Thoughts:Homicidal Thoughts: No   Sensorium  Memory: Immediate Good; Recent Good; Remote Good  Judgment: Fair (makes appropriate decisions regarding her care)  Insight: Fair (Demonstrates understanding of her treatment progress and the factors influencing her discharge.)   Executive Functions  Concentration: Fair  Attention Span: Fair  Recall: Good  Fund of Knowledge: Good  Language: Good   Psychomotor Activity  Psychomotor Activity: Psychomotor Activity: Normal   Assets  Assets: Housing; Social Support; Manufacturing Systems Engineer; Desire for Improvement   Sleep  Sleep: Sleep: Good Number of Hours of Sleep: 7    Physical Exam: Physical Exam Vitals and nursing note reviewed.  Constitutional:      Appearance: Normal appearance.  HENT:     Head: Normocephalic and atraumatic.     Nose: Nose normal.  Pulmonary:     Effort: Pulmonary effort is normal.  Musculoskeletal:        General: Normal range of motion.     Cervical back: Normal range of motion.  Neurological:     General: No focal deficit present.     Mental Status: She is alert and oriented to person, place, and time. Mental status is at baseline.  Psychiatric:        Attention and Perception: Attention and perception normal.        Mood and Affect: Mood is anxious. Affect is flat.        Speech: Speech normal.        Behavior: Behavior normal. Behavior is cooperative.        Thought Content: Thought content normal.        Cognition and Memory: Cognition and memory normal.        Judgment: Judgment  normal.    ROS Blood pressure (!) 138/97, pulse 78, temperature 97.9 F (36.6 C), resp. rate 20, height 5' 2 (1.575 m), weight 93.4 kg, last menstrual period 03/10/2023, SpO2 100%. Body mass index is 37.68 kg/m.   Treatment Plan Summary: Daily contact with patient to assess and evaluate symptoms and progress in treatment and Medication management Aripiprazole  5 mg orally  daily to address psychotic symptoms and augment antidepressant therapy. Sertraline  50 mg orally daily for depressive symptoms. potassium chloride  40 mEq orally as a single dose to address any potential hypokalemia. Fioricet 1 tablet every 6 hours as needed. Engage the patient in individual psychotherapy sessions to explore underlying issues contributing to her depression and to develop coping strategies. Provide psychoeducation regarding her diagnosis, the importance of medication adherence, and the benefits of engaging in therapeutic activities. Brad GORMAN Moats, NP 03/19/2023, 5:33 PM

## 2023-03-18 NOTE — Group Note (Signed)
 Date:  03/17/2023 Time:  3:30 pm  Group Topic/Focus:  Outdoor Recreation    Participation Level:  Active  Participation Quality:  Appropriate  Affect:  Appropriate  Cognitive:  Appropriate  Insight: Appropriate  Engagement in Group:  Engaged  Modes of Intervention:  Activity  Additional Comments:    [name] [name] 03/18/2023, 10:24 AM

## 2023-03-19 DIAGNOSIS — F333 Major depressive disorder, recurrent, severe with psychotic symptoms: Secondary | ICD-10-CM | POA: Diagnosis not present

## 2023-03-19 LAB — GLUCOSE, CAPILLARY
Glucose-Capillary: 104 mg/dL — ABNORMAL HIGH (ref 70–99)
Glucose-Capillary: 118 mg/dL — ABNORMAL HIGH (ref 70–99)
Glucose-Capillary: 125 mg/dL — ABNORMAL HIGH (ref 70–99)

## 2023-03-19 NOTE — Plan of Care (Signed)
   Problem: Education: Goal: Emotional status will improve Outcome: Progressing

## 2023-03-19 NOTE — Progress Notes (Signed)
   03/19/23 1700  Psych Admission Type (Psych Patients Only)  Admission Status Voluntary  Psychosocial Assessment  Patient Complaints Anxiety  Eye Contact Fair  Facial Expression Anxious  Affect Anxious  Speech Logical/coherent  Interaction Assertive  Motor Activity Slow  Appearance/Hygiene Unremarkable  Behavior Characteristics Cooperative  Mood Preoccupied  Thought Process  Coherency WDL  Content Preoccupation  Delusions None reported or observed  Perception WDL  Hallucination None reported or observed  Judgment WDL  Confusion None  Danger to Self  Current suicidal ideation? Denies  Danger to Others  Danger to Others None reported or observed

## 2023-03-19 NOTE — Plan of Care (Signed)

## 2023-03-19 NOTE — Progress Notes (Signed)
 [facility] MD Progress Note  03/19/2023 5:38 PM CORTASIA SCREWS  MRN:  [id number] Subjective:  : 43 year old African American female,During morning rounds, the patient reported, My son is on the run from the police. They are looking for him for armed robbery. She appeared upset and emotionally distressed initially but was able to calm herself with support and redirection.Patient continues to experience anxiety related to personal and family stressors, including concerns about her son, discharge planning, and transitioning to a new living arrangement. Despite these stressors, she remains engaged in treatment and denies suicidal or homicidal ideation (SI/HI). Principal Problem: Severe recurrent depression with psychosis (HCC) Diagnosis: Principal Problem:   Severe recurrent depression with psychosis (HCC) Active Problems:   Essential hypertension, benign  Total Time spent with patient: 1 hour  Past Psychiatric History: Depression  Past Medical History:  Past Medical History:  Diagnosis Date   DELAYED MENSES 02/11/2010   Qualifier: Diagnosis of  By: Reynaldo  MD, Khary     Hypertension    MIGRAINE HEADACHE 12/13/2007   Urinary tract infection 03/04/2011    Past Surgical History:  Procedure Laterality Date   NO PAST SURGERIES     Family History:  Family History  Problem Relation Age of Onset   Healthy Mother    Sickle cell trait Sister    Family Psychiatric  History: none reported Social History:  Social History   Substance and Sexual Activity  Alcohol Use No     Social History   Substance and Sexual Activity  Drug Use No    Social History   Socioeconomic History   Marital status: Single    Spouse name: Not on file   Number of children: Not on file   Years of education: Not on file   Highest education level: Not on file  Occupational History   Not on file  Tobacco Use   Smoking status: Never    Passive exposure: Never   Smokeless tobacco: Never  Vaping Use   Vaping  status: Never Used  Substance and Sexual Activity   Alcohol use: No   Drug use: No   Sexual activity: Yes    Birth control/protection: Condom  Other Topics Concern   Not on file  Social History Narrative   Not on file   Social Drivers of Health   Financial Resource Strain: Not on file  Food Insecurity: No Food Insecurity (03/16/2023)   Hunger Vital Sign    Worried About Running Out of Food in the Last Year: Never true    Ran Out of Food in the Last Year: Never true  Transportation Needs: No Transportation Needs (03/16/2023)   PRAPARE - Administrator, Civil Service (Medical): No    Lack of Transportation (Non-Medical): No  Physical Activity: Not on file  Stress: Not on file  Social Connections: Not on file   Additional Social History:                         Sleep: Good  Appetite:  Good  Current Medications: Current Facility-Administered Medications  Medication Dose Route Frequency Provider Last Rate Last Admin   acetaminophen  (TYLENOL ) tablet 650 mg  650 mg Oral Q6H PRN Coleman, Carolyn H, NP   650 mg at 03/19/23 1544   alum & mag hydroxide-simeth (MAALOX/MYLANTA) 200-200-20 MG/5ML suspension 30 mL  30 mL Oral Q4H PRN Coleman, Carolyn H, NP       amLODipine  (NORVASC ) tablet 10 mg  10  mg Oral Daily Coleman, Carolyn H, NP   10 mg at 03/19/23 0840   ARIPiprazole  (ABILIFY ) tablet 5 mg  5 mg Oral Daily Coleman, Carolyn H, NP   5 mg at 03/19/23 0840   butalbital -acetaminophen -caffeine  (FIORICET) 50-325-40 MG per tablet 1 tablet  1 tablet Oral Q6H PRN Jhonny Sahara B, MD       haloperidol  (HALDOL ) tablet 5 mg  5 mg Oral TID PRN Mardy Elveria DEL, NP   5 mg at 03/16/23 1047   And   diphenhydrAMINE  (BENADRYL ) capsule 50 mg  50 mg Oral TID PRN Coleman, Carolyn H, NP   50 mg at 03/18/23 2112   haloperidol  lactate (HALDOL ) injection 5 mg  5 mg Intramuscular TID PRN Mardy Elveria DEL, NP       And   diphenhydrAMINE  (BENADRYL ) injection 50 mg  50 mg  Intramuscular TID PRN Mardy Elveria DEL, NP       And   LORazepam  (ATIVAN ) injection 2 mg  2 mg Intramuscular TID PRN Coleman, Carolyn H, NP       haloperidol  lactate (HALDOL ) injection 10 mg  10 mg Intramuscular TID PRN Mardy Elveria DEL, NP       And   diphenhydrAMINE  (BENADRYL ) injection 50 mg  50 mg Intramuscular TID PRN Mardy Elveria DEL, NP       And   LORazepam  (ATIVAN ) injection 2 mg  2 mg Intramuscular TID PRN Coleman, Carolyn H, NP       DULoxetine  (CYMBALTA ) DR capsule 20 mg  20 mg Oral Daily Nicholaus Brad RAMAN, NP   20 mg at 03/19/23 0840   hydrochlorothiazide  (HYDRODIURIL ) tablet 25 mg  25 mg Oral Daily Coleman, Carolyn H, NP   25 mg at 03/19/23 0840   hydrOXYzine  (ATARAX ) tablet 25 mg  25 mg Oral TID PRN Coleman, Carolyn H, NP   25 mg at 03/18/23 2112   ibuprofen  (ADVIL ) tablet 400 mg  400 mg Oral Q6H PRN Sreenath, Sudheer B, MD       magnesium  hydroxide (MILK OF MAGNESIA) suspension 30 mL  30 mL Oral Daily PRN Coleman, Carolyn H, NP       melatonin tablet 5 mg  5 mg Oral QHS Anarely Nicholls S, NP   5 mg at 03/18/23 2112   methocarbamol  (ROBAXIN ) tablet 500 mg  500 mg Oral BID Coleman, Carolyn H, NP   500 mg at 03/19/23 1619   traZODone  (DESYREL ) tablet 50 mg  50 mg Oral QHS PRN Coleman, Carolyn H, NP   50 mg at 03/18/23 2112    Lab Results:  Results for orders placed or performed during the hospital encounter of 03/16/23 (from the past 48 hours)  Glucose, capillary     Status: Abnormal   Collection Time: 03/17/23  9:04 PM  Result Value Ref Range   Glucose-Capillary 206 (H) 70 - 99 mg/dL    Comment: Glucose reference range applies only to samples taken after fasting for at least 8 hours.  Glucose, capillary     Status: Abnormal   Collection Time: 03/18/23  8:22 PM  Result Value Ref Range   Glucose-Capillary 116 (H) 70 - 99 mg/dL    Comment: Glucose reference range applies only to samples taken after fasting for at least 8 hours.  Glucose, capillary     Status: Abnormal    Collection Time: 03/19/23 11:27 AM  Result Value Ref Range   Glucose-Capillary 104 (H) 70 - 99 mg/dL    Comment: Glucose reference range applies  only to samples taken after fasting for at least 8 hours.  Glucose, capillary     Status: Abnormal   Collection Time: 03/19/23  4:17 PM  Result Value Ref Range   Glucose-Capillary 118 (H) 70 - 99 mg/dL    Comment: Glucose reference range applies only to samples taken after fasting for at least 8 hours.    Blood Alcohol level:  Lab Results  Component Value Date   ETH <10 03/15/2023   ETH <10 08/14/2018    Metabolic Disorder Labs: Lab Results  Component Value Date   HGBA1C 6.1 (H) 03/15/2023   MPG 128.37 03/15/2023   No results found for: PROLACTIN Lab Results  Component Value Date   CHOL 197 08/05/2021   TRIG 50 08/05/2021   HDL 59 08/05/2021   CHOLHDL 3.3 08/05/2021   VLDL 17 11/09/2007   LDLCALC 129 (H) 08/05/2021   LDLCALC 165 (H) 11/09/2007    Physical Findings: AIMS:  , ,  ,  ,    CIWA:    COWS:     Musculoskeletal: Strength & Muscle Tone: within normal limits Gait & Station: normal Patient leans: N/A  Psychiatric Specialty Exam:  Presentation  General Appearance:  Appropriate for Environment; Neat  Eye Contact: Good  Speech: Clear and Coherent; Normal Rate  Speech Volume: Normal  Handedness: Right   Mood and Affect  Mood: Anxious  Affect: Congruent; Appropriate   Thought Process  Thought Processes: Coherent; Goal Directed  Descriptions of Associations:Intact  Orientation:Full (Time, Place and Person)  Thought Content:WDL  History of Schizophrenia/Schizoaffective disorder:No  Duration of Psychotic Symptoms:N/A  Hallucinations:Hallucinations: None Description of Auditory Hallucinations: denies  Ideas of Reference:None  Suicidal Thoughts:Suicidal Thoughts: No SI Active Intent and/or Plan: -- (denies) SI Passive Intent and/or Plan: -- (denies)  Homicidal Thoughts:Homicidal  Thoughts: No   Sensorium  Memory: Immediate Good; Recent Good; Remote Good  Judgment: Fair (makes appropriate decisions regarding her care)  Insight: Fair (Demonstrates understanding of her treatment progress and the factors influencing her discharge.)   Executive Functions  Concentration: Fair  Attention Span: Fair  Recall: Good  Fund of Knowledge: Good  Language: Good   Psychomotor Activity  Psychomotor Activity: Psychomotor Activity: Normal   Assets  Assets: Housing; Social Support; Manufacturing Systems Engineer; Desire for Improvement   Sleep  Sleep: Sleep: Good Number of Hours of Sleep: 7    Physical Exam: Physical Exam Vitals and nursing note reviewed.  Constitutional:      Appearance: Normal appearance.  HENT:     Head: Normocephalic and atraumatic.     Nose: Nose normal.  Musculoskeletal:        General: Normal range of motion.     Cervical back: Normal range of motion.  Neurological:     General: No focal deficit present.     Mental Status: She is alert. Mental status is at baseline.  Psychiatric:        Attention and Perception: Attention and perception normal.        Mood and Affect: Mood is anxious. Affect is flat.        Speech: Speech normal.        Behavior: Behavior normal. Behavior is cooperative.        Thought Content: Thought content normal.        Cognition and Memory: Cognition and memory normal.        Judgment: Judgment normal.    Review of Systems  Psychiatric/Behavioral:  The patient is nervous/anxious.   All other  systems reviewed and are negative.  Blood pressure (!) 138/97, pulse 78, temperature 97.9 F (36.6 C), resp. rate 20, height 5' 2 (1.575 m), weight 93.4 kg, last menstrual period 03/10/2023, SpO2 100%. Body mass index is 37.68 kg/m.   Treatment Plan Summary: Daily contact with patient to assess and evaluate symptoms and progress in treatment and Medication management Aripiprazole  5 mg orally daily to  address psychotic symptoms and augment antidepressant therapy. Sertraline  50 mg orally daily for depressive symptoms. potassium chloride  40 mEq orally as a single dose to address any potential hypokalemia. Fioricet 1 tablet every 6 hours as needed. Engage the patient in individual psychotherapy sessions to explore underlying issues contributing to her depression and to develop coping strategies. Provide psychoeducation regarding her diagnosis, the importance of medication adherence, and the benefits of engaging in therapeutic activities. Brad GORMAN Moats, NP 03/19/2023, 5:38 PM

## 2023-03-19 NOTE — Progress Notes (Signed)
 Pt calm and pleasant during assessment denying SI/HI/AVH. Pt observed by this Clinical research associate interacting appropriately with staff and peers on the unit. Pt compliant with medication administration per MD orders. Pt given education, support, and encouragement to be active in her treatment plan. Pt being monitored Q 15 minutes for safety per unit protocol, remains safe on the unit

## 2023-03-19 NOTE — Group Note (Signed)
 Date:  03/19/2023 Time:  2:36 PM  Group Topic/Focus:  Activity Group: The focus of this group is to promote activity for the patients and encourage them to go outside to the courtyard for some fresh air and some exercise.    Participation Level:  Active  Participation Quality:  Appropriate  Affect:  Appropriate  Cognitive:  Appropriate  Insight: Appropriate  Engagement in Group:  Engaged  Modes of Intervention:  Activity  Additional Comments:    Kendra Harris 03/19/2023, 2:36 PM

## 2023-03-19 NOTE — Group Note (Signed)
 Date:  03/19/2023 Time:  2:31 PM  Group Topic/Focus:  Healthy Communication:   The focus of this group is to discuss communication, barriers to communication, as well as healthy ways to communicate with others.    Participation Level:  Active  Participation Quality:  Appropriate  Affect:  Appropriate  Cognitive:  Appropriate  Insight: Appropriate  Engagement in Group:  Engaged  Modes of Intervention:  Activity  Additional Comments:    Kendra Harris 03/19/2023, 2:31 PM

## 2023-03-20 DIAGNOSIS — G894 Chronic pain syndrome: Secondary | ICD-10-CM | POA: Insufficient documentation

## 2023-03-20 DIAGNOSIS — F322 Major depressive disorder, single episode, severe without psychotic features: Secondary | ICD-10-CM

## 2023-03-20 LAB — GLUCOSE, CAPILLARY: Glucose-Capillary: 73 mg/dL (ref 70–99)

## 2023-03-20 MED ORDER — ARIPIPRAZOLE 5 MG PO TABS: 5.0000 mg | ORAL_TABLET | Freq: Every day | ORAL | Status: DC

## 2023-03-20 MED ORDER — MELATONIN 5 MG PO TABS: 5.0000 mg | ORAL_TABLET | Freq: Every day | ORAL | 0 refills | Status: AC

## 2023-03-20 MED ORDER — BUTALBITAL-APAP-CAFFEINE 50-325-40 MG PO TABS: 1.0000 | ORAL_TABLET | Freq: Four times a day (QID) | ORAL | 0 refills | Status: DC | PRN

## 2023-03-20 MED ORDER — DULOXETINE HCL 20 MG PO CPEP: 20.0000 mg | ORAL_CAPSULE | Freq: Every day | ORAL | 0 refills | Status: DC

## 2023-03-20 MED ORDER — POTASSIUM CITRATE ER 10 MEQ (1080 MG) PO TBCR: 10.0000 meq | EXTENDED_RELEASE_TABLET | Freq: Every day | ORAL | 0 refills | Status: AC

## 2023-03-20 MED ORDER — ORAL CARE MOUTH RINSE: 15.0000 mL | OROMUCOSAL | 0 refills | Status: AC | PRN

## 2023-03-20 MED ORDER — IBUPROFEN 400 MG PO TABS: 400.0000 mg | ORAL_TABLET | Freq: Three times a day (TID) | ORAL | 0 refills | Status: AC | PRN

## 2023-03-20 MED ORDER — ORAL CARE MOUTH RINSE: 15.0000 mL | OROMUCOSAL | Status: DC | PRN

## 2023-03-20 MED ORDER — AMLODIPINE BESYLATE 10 MG PO TABS: 10.0000 mg | ORAL_TABLET | Freq: Every day | ORAL | 0 refills | Status: DC

## 2023-03-20 MED ORDER — POTASSIUM CITRATE ER 10 MEQ (1080 MG) PO TBCR
10.0000 meq | EXTENDED_RELEASE_TABLET | Freq: Every day | ORAL | Status: DC
  Administered 2023-03-20: 10 meq via ORAL
  Filled 2023-03-20: qty 1

## 2023-03-20 MED ORDER — TRAZODONE HCL 50 MG PO TABS: 50.0000 mg | ORAL_TABLET | Freq: Every evening | ORAL | 0 refills | Status: AC | PRN

## 2023-03-20 NOTE — Plan of Care (Signed)
 Kendra Harris is a 43 y.o. female patient. No diagnosis found. Past Medical History:  Diagnosis Date   DELAYED MENSES 02/11/2010   Qualifier: Diagnosis of  By: Reynaldo  MD, Khary     Hypertension    MIGRAINE HEADACHE 12/13/2007   Urinary tract infection 03/04/2011   Current Facility-Administered Medications  Medication Dose Route Frequency Provider Last Rate Last Admin   acetaminophen  (TYLENOL ) tablet 650 mg  650 mg Oral Q6H PRN Coleman, Carolyn H, NP   650 mg at 03/19/23 1544   alum & mag hydroxide-simeth (MAALOX/MYLANTA) 200-200-20 MG/5ML suspension 30 mL  30 mL Oral Q4H PRN Coleman, Carolyn H, NP       amLODipine  (NORVASC ) tablet 10 mg  10 mg Oral Daily Coleman, Carolyn H, NP   10 mg at 03/19/23 0840   ARIPiprazole  (ABILIFY ) tablet 5 mg  5 mg Oral Daily Coleman, Carolyn H, NP   5 mg at 03/19/23 0840   butalbital -acetaminophen -caffeine  (FIORICET) 50-325-40 MG per tablet 1 tablet  1 tablet Oral Q6H PRN Jhonny Calvin NOVAK, MD       haloperidol  (HALDOL ) tablet 5 mg  5 mg Oral TID PRN Mardy Elveria DEL, NP   5 mg at 03/16/23 1047   And   diphenhydrAMINE  (BENADRYL ) capsule 50 mg  50 mg Oral TID PRN Coleman, Carolyn H, NP   50 mg at 03/18/23 2112   haloperidol  lactate (HALDOL ) injection 5 mg  5 mg Intramuscular TID PRN Mardy Elveria DEL, NP       And   diphenhydrAMINE  (BENADRYL ) injection 50 mg  50 mg Intramuscular TID PRN Mardy Elveria DEL, NP       And   LORazepam  (ATIVAN ) injection 2 mg  2 mg Intramuscular TID PRN Coleman, Carolyn H, NP       haloperidol  lactate (HALDOL ) injection 10 mg  10 mg Intramuscular TID PRN Mardy Elveria DEL, NP       And   diphenhydrAMINE  (BENADRYL ) injection 50 mg  50 mg Intramuscular TID PRN Mardy Elveria DEL, NP       And   LORazepam  (ATIVAN ) injection 2 mg  2 mg Intramuscular TID PRN Coleman, Carolyn H, NP       DULoxetine  (CYMBALTA ) DR capsule 20 mg  20 mg Oral Daily Nicholaus Brad RAMAN, NP   20 mg at 03/19/23 0840   hydrochlorothiazide  (HYDRODIURIL )  tablet 25 mg  25 mg Oral Daily Coleman, Carolyn H, NP   25 mg at 03/19/23 0840   hydrOXYzine  (ATARAX ) tablet 25 mg  25 mg Oral TID PRN Coleman, Carolyn H, NP   25 mg at 03/19/23 2115   ibuprofen  (ADVIL ) tablet 400 mg  400 mg Oral Q6H PRN Sreenath, Sudheer B, MD       magnesium  hydroxide (MILK OF MAGNESIA) suspension 30 mL  30 mL Oral Daily PRN Coleman, Carolyn H, NP       melatonin tablet 5 mg  5 mg Oral QHS Nicholaus Brad RAMAN, NP   5 mg at 03/19/23 2114   methocarbamol  (ROBAXIN ) tablet 500 mg  500 mg Oral BID Coleman, Carolyn H, NP   500 mg at 03/19/23 1619   Oral care mouth rinse  15 mL Mouth Rinse PRN Victoria Ruts, MD       traZODone  (DESYREL ) tablet 50 mg  50 mg Oral QHS PRN Coleman, Carolyn H, NP   50 mg at 03/19/23 2116   No Known Allergies Principal Problem:   Severe recurrent depression with psychosis (HCC) Active Problems:  Essential hypertension, benign  Blood pressure (!) 139/90, pulse 84, temperature 98.2 F (36.8 C), resp. rate 20, height 5' 2 (1.575 m), weight 93.4 kg, last menstrual period 03/10/2023, SpO2 99%.  Subjective Objective Assessment & Plan  Kendra Harris 03/20/2023

## 2023-03-20 NOTE — Group Note (Signed)
 Date:  03/20/2023 Time:  10:21 AM  Group Topic/Focus:  Goals Group:   The focus of this group is to help patients establish daily goals to achieve during treatment and discuss how the patient can incorporate goal setting into their daily lives to aide in recovery. Healthy Communication:   The focus of this group is to discuss communication, barriers to communication, as well as healthy ways to communicate with others. Making Healthy Choices:   The focus of this group is to help patients identify negative/unhealthy choices they were using prior to admission and identify positive/healthier coping strategies to replace them upon discharge.    Participation Level:  Active  Participation Quality:  Appropriate and Attentive  Affect:  Appropriate  Cognitive:  Alert, Appropriate, and Oriented  Insight: Appropriate  Engagement in Group:  Developing/Improving and Improving  Modes of Intervention:  Activity, Discussion, Education, and Socialization  Additional Comments:    Lurlean Niece 03/20/2023, 10:21 AM

## 2023-03-20 NOTE — Plan of Care (Signed)
  Problem: Education: Goal: Knowledge of [facility] Education information/materials will improve Outcome: Adequate for Discharge Goal: Emotional status will improve Outcome: Adequate for Discharge Goal: Mental status will improve Outcome: Adequate for Discharge Goal: Verbalization of understanding the information provided will improve Outcome: Adequate for Discharge   

## 2023-03-20 NOTE — Progress Notes (Addendum)
  [facility] Adult Case Management Discharge Plan :  Will you be returning to the same living situation after discharge:  Yes,  [address]  [place]  At discharge, do you have transportation home?: No. Do you have the ability to pay for your medications: Yes,  Patient stated that she will have her brother purchase the medication.  Release of information consent forms completed and in the chart;  Patient's signature needed at discharge.  Patient to Follow up at:  Follow-up Information     [facility]. Go to.   Why: Virtual appointment  03/25/23 at 9 AM. You will be contacted at [phone]. Contact information: [address]  [address] [place] [address] [phone]         [facility] Follow up.   Why: Your PCP appoitment is scheduled for 03/28/2023 at 10AM. Contact information: [address] [place]  [address] [phone]                Next level of care provider has access to [facility] Link:yes  Safety Planning and Suicide Prevention discussed: Yes,  SPE completed with pt, as pt refused to consent to family contact.      Has patient been referred to the Quitline?: Patient does not use tobacco/nicotine products  Patient has been referred for addiction treatment: Yes, the patient will follow up with an outpatient provider for substance use disorder. Psychiatrist/APP: appointment made and PCP: appointment made  Roselyn GORMAN Lento, LCSW 03/20/2023, 10:26 AM

## 2023-03-20 NOTE — Discharge Summary (Signed)
 Physician Discharge Summary Note  Patient:  Kendra Harris is an 43 y.o., female MRN:  [id number] DOB:  1980/10/06 Patient phone:  [phone] (home)  Patient address:   [address] [place] [address],  Total Time spent with patient: 2 hours  Date of Admission:  03/16/2023 Date of Discharge: 03/20/2023  Reason for Admission:  43 yo African American female was admitted due to worsening depression, anxiety, and recent onset of auditory hallucinations. She endorsed suicidal ideation with a plan involving walking into traffic. Given her inability to contract for safety and the severity of her symptoms, inpatient psychiatric admission was deemed necessary for stabilization and medication management.The patient presented to the Emergency Department with worsening depression, anxiety, and auditory hallucinations. She reported feelings of hopelessness, helplessness, tearfulness, irritability, worthlessness, decreased focus, decreased motivation, decreased appetite, and increased sleep. She also endorsed a 14-pound weight loss over two weeks and reported wandering aimlessly outside. The patient experienced a female voice telling her she was "worthless" and to "kill herself." She denied homicidal ideation, visual hallucinations, and appeared cooperative, without overt signs of mania, psychosis, paranoia, or delusions.  Principal Problem: MDD (major depressive disorder), severe (HCC) Discharge Diagnoses: Principal Problem:   MDD (major depressive disorder), severe (HCC) Active Problems:   Essential hypertension, benign   Chronic pain syndrome   Past Psychiatric History: Depression  Past Medical History:  Past Medical History:  Diagnosis Date   DELAYED MENSES 02/11/2010   Qualifier: Diagnosis of  By: Reynaldo  MD, Khary     Hypertension    MIGRAINE HEADACHE 12/13/2007   Urinary tract infection 03/04/2011    Past Surgical History:  Procedure Laterality Date   NO PAST SURGERIES     Family  History:  Family History  Problem Relation Age of Onset   Healthy Mother    Sickle cell trait Sister    Family Psychiatric  History: none reported Social History:  Social History   Substance and Sexual Activity  Alcohol Use No     Social History   Substance and Sexual Activity  Drug Use No    Social History   Socioeconomic History   Marital status: Single    Spouse name: Not on file   Number of children: Not on file   Years of education: Not on file   Highest education level: Not on file  Occupational History   Not on file  Tobacco Use   Smoking status: Never    Passive exposure: Never   Smokeless tobacco: Never  Vaping Use   Vaping status: Never Used  Substance and Sexual Activity   Alcohol use: No   Drug use: No   Sexual activity: Yes    Birth control/protection: Condom  Other Topics Concern   Not on file  Social History Narrative   Not on file   Social Drivers of Health   Financial Resource Strain: Not on file  Food Insecurity: No Food Insecurity (03/16/2023)   Hunger Vital Sign    Worried About Running Out of Food in the Last Year: Never true    Ran Out of Food in the Last Year: Never true  Transportation Needs: No Transportation Needs (03/16/2023)   PRAPARE - Administrator, Civil Service (Medical): No    Lack of Transportation (Non-Medical): No  Physical Activity: Not on file  Stress: Not on file  Social Connections: Not on file    Hospital Course:  Patient admitted for safety and inpatient psychiatric stabilization. She was started  on Aripiprazole  5 mg orally daily to address psychotic symptoms and augment antidepressant therapy, Sertraline  50 mg orally daily for depressive symptoms, Potassium chloride  40 mEq orally as a single dose to correct hypokalemia, Fioricet 1 tablet every 6 hours as needed for headache.Engaged in individual psychotherapy sessions to explore underlying issues contributing to depression and develop coping strategies.  She was monitored for any worsening psychotic symptoms or medication side effects.Patient demonstrated improved mood stabilization and adherence to treatment.No signs of acute neurologic deficits (CVA/TIA ruled out).Patient requested a 30-day medication supply but was unable to get the medication. Offered to send the prescription to another pharmacy. Patient stated, 'My daughter will get it for me.'Patient instructed to follow up with outpatient psychiatric provider within 7 days.Encouraged to engage in individual therapy for continued mental health support.Referral to an intensive outpatient program (IOP) or partial hospitalization program (PHP) recommended.Advised to establish care with a primary care provider (PCP) for medical follow-up and ongoing management.The patient was educated on the importance of medication adherence, withdrawal symptom monitoring, and engaging in mental health and substance use treatment. She verbalized understanding and agreed to follow up with outpatient services.The patient was discharged safely and stabilized, with outpatient follow-up arranged.   Musculoskeletal: Strength & Muscle Tone: within normal limits Gait & Station: normal Patient leans: N/A   Psychiatric Specialty Exam:  Presentation  General Appearance:  Appropriate for Environment; Neat  Eye Contact: Good (Engaged and cooperative)  Speech: Clear and Coherent; Garbled  Speech Volume: Normal  Handedness: Right   Mood and Affect  Mood: Euthymic  Affect: Appropriate; Flat; Congruent   Thought Process  Thought Processes: Coherent; Goal Directed  Descriptions of Associations:Intact  Orientation:Full (Time, Place and Person) (and situation)  Thought Content:Logical; WDL  History of Schizophrenia/Schizoaffective disorder:No  Duration of Psychotic Symptoms:Less than six months  Hallucinations:Hallucinations: None Description of Auditory Hallucinations: denies  Ideas of  Reference:None  Suicidal Thoughts:Suicidal Thoughts: No SI Active Intent and/or Plan: -- (denies) SI Passive Intent and/or Plan: -- (denies)  Homicidal Thoughts:Homicidal Thoughts: No   Sensorium  Memory: Immediate Good; Recent Good; Remote Good  Judgment: Fair  Insight: Fair (understand the need for medication adherence)   Executive Functions  Concentration: Fair  Attention Span: Good  Recall: Good  Fund of Knowledge: Good  Language: Good   Psychomotor Activity  Psychomotor Activity: Psychomotor Activity: Normal   Assets  Assets: Communication Skills; Housing; Social Support; Financial Resources/Insurance   Sleep  Sleep: Sleep: Good Number of Hours of Sleep: 7    Physical Exam: Physical Exam ROS Blood pressure 129/68, pulse 70, temperature 98.5 F (36.9 C), resp. rate 18, height 5' 2 (1.575 m), weight 93.4 kg, last menstrual period 03/10/2023, SpO2 100%. Body mass index is 37.68 kg/m.   Social History   Tobacco Use  Smoking Status Never   Passive exposure: Never  Smokeless Tobacco Never   Tobacco Cessation:  N/A, patient does not currently use tobacco products   Blood Alcohol level:  Lab Results  Component Value Date   ETH <10 03/15/2023   ETH <10 08/14/2018    Metabolic Disorder Labs:  Lab Results  Component Value Date   HGBA1C 6.1 (H) 03/15/2023   MPG 128.37 03/15/2023   No results found for: PROLACTIN Lab Results  Component Value Date   CHOL 197 08/05/2021   TRIG 50 08/05/2021   HDL 59 08/05/2021   CHOLHDL 3.3 08/05/2021   VLDL 17 11/09/2007   LDLCALC 129 (H) 08/05/2021   LDLCALC 165 (H) 11/09/2007  See Psychiatric Specialty Exam and Suicide Risk Assessment completed by Attending Physician prior to discharge.  Discharge destination:  Home  Is patient on multiple antipsychotic therapies at discharge:  No   Has Patient had three or more failed trials of antipsychotic monotherapy by history:  No  Recommended  Plan for Multiple Antipsychotic Therapies: NA   Allergies as of 03/20/2023   No Known Allergies      Medication List     STOP taking these medications    acetaminophen  500 MG tablet Commonly known as: TYLENOL    sertraline  25 MG tablet Commonly known as: Zoloft        TAKE these medications      Indication  amLODipine  10 MG tablet Commonly known as: NORVASC  Take 1 tablet (10 mg total) by mouth daily. Start taking on: March 21, 2023 What changed:  how much to take how to take this when to take this additional instructions  Indication: High Blood Pressure   ARIPiprazole  5 MG tablet Commonly known as: ABILIFY  Take 1 tablet (5 mg total) by mouth daily.  Indication: Major Depressive Disorder   butalbital -acetaminophen -caffeine  50-325-40 MG tablet Commonly known as: FIORICET Take 1 tablet by mouth every 6 (six) hours as needed for up to 14 days for headache or migraine (Non-response to tylenol  or ibuprofen ).  Indication: Tension Headache   DULoxetine  20 MG capsule Commonly known as: CYMBALTA  Take 1 capsule (20 mg total) by mouth daily. Start taking on: March 21, 2023  Indication: Major Depressive Disorder, Musculoskeletal Pain   hydrochlorothiazide  25 MG tablet Commonly known as: HYDRODIURIL  Take 1 tablet (25 mg total) by mouth daily.  Indication: High Blood Pressure   ibuprofen  400 MG tablet Commonly known as: ADVIL  Take 1 tablet (400 mg total) by mouth every 8 (eight) hours as needed for headache or mild pain (pain score 1-3) (Headache not responding to tylenol ).  Indication: Pain   melatonin 5 MG Tabs Take 1 tablet (5 mg total) by mouth at bedtime.  Indication: Depression   methocarbamol  500 MG tablet Commonly known as: ROBAXIN  Take 1 tablet (500 mg total) by mouth 2 (two) times daily.  Indication: Musculoskeletal Pain   mouth rinse Liqd solution 15 mLs by Mouth Rinse route as needed (for oral care).  Indication: oral care   potassium citrate   10 MEQ (1080 MG) SR tablet Commonly known as: UROCIT-K  Take 1 tablet (10 mEq total) by mouth daily.  Indication: Low Amount of Potassium in the Blood   traZODone  50 MG tablet Commonly known as: DESYREL  Take 1 tablet (50 mg total) by mouth at bedtime as needed for sleep.  Indication: Trouble Sleeping        Follow-up [employer] [facility]. Go to.   Why: Virtual appointment  03/25/23 at 9 AM. You will be contacted at [phone]. Contact information: [address]  [address] [place] [address] [phone]         [facility] Follow up.   Why: Your PCP appoitment is scheduled for 03/28/2023 at 10AM. Contact information: [address] [place]  [address] [phone]                Follow-up recommendations:  Activity:  as tolerated Diet:  heart healthy  Comments:   Aripiprazole  10 mg orally daily to address psychotic symptoms and augment antidepressant therapy. Sertraline  50 mg orally daily for depressive symptoms. Potassium chloride  10 mEq orally daily to correct hypokalemia. Fioricet 1 tablet every 6 hours as needed  for headache. Patient instructed to follow up with outpatient psychiatric provider within 7 days. Encouraged to engage in individual therapy for continued mental health support. Advised to establish care with a primary care provider (PCP) for medical follow-up and ongoing management within 7 days National Suicide Prevention Lifeline: [address] [employer] (Substance Use Support): 1-800-662-HELP (619)308-2633) Crisis Text Line: Text HOME to 316-548-0066  Signed: Brad GORMAN Moats, NP 03/20/2023, 12:56 PM

## 2023-03-20 NOTE — Progress Notes (Signed)
 Patient was discharged via taxi at 3:10 PM.  AVS, medication instructions, follow up appointments reviewed with Pt.  Pt denies SI Hi AVH.  Belongings returned to pt's satisfaction.    03/20/23 1300  Psych Admission Type (Psych Patients Only)  Admission Status Voluntary  Psychosocial Assessment  Patient Complaints Anxiety  Eye Contact Fair  Facial Expression Anxious  Affect Anxious  Speech Logical/coherent  Interaction Assertive  Motor Activity Slow  Appearance/Hygiene In scrubs  Behavior Characteristics Cooperative  Mood Preoccupied  Thought Process  Coherency WDL  Content Preoccupation  Delusions None reported or observed  Perception WDL  Hallucination None reported or observed  Judgment WDL  Confusion None  Danger to Self  Current suicidal ideation? Denies  Description of Suicide Plan none  Agreement Not to Harm Self Yes  Description of Agreement verbal  Danger to Others  Danger to Others None reported or observed
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
## 2023-03-21 ENCOUNTER — Telehealth: Payer: Self-pay

## 2023-03-21 NOTE — Transitions of Care (Post Inpatient/ED Visit) (Signed)
 03/21/2023  Name: Kendra Harris MRN: [id number] DOB: 08-21-1980  Today's TOC FU Call Status: Today's TOC FU Call Status:: Successful TOC FU Call Completed TOC FU Call Complete Date: 03/21/23 Patient's Name and Date of Birth confirmed.  Transition Care Management Follow-up Telephone Call Date of Discharge: 03/20/23 Discharge Facility: [facility]) Type of Discharge: Inpatient Admission Primary Inpatient Discharge Diagnosis:: depression How have you been since you were released from the hospital?: Better Any questions or concerns?: No  Items Reviewed: Did you receive and understand the discharge instructions provided?: Yes Medications obtained,verified, and reconciled?: Yes (Medications Reviewed) Any new allergies since your discharge?: No Dietary orders reviewed?: Yes Do you have support at home?: No  Medications Reviewed Today: Medications Reviewed Today     Reviewed by Darrall Ellison, LPN (Licensed Practical Nurse) on 03/21/23 at [phone]  Med List Status: <None>   Medication Order Taking? Sig Documenting Provider Last Dose Status Informant  amLODipine  (NORVASC ) 10 MG tablet [id number]  Take 1 tablet (10 mg total) by mouth daily. Rosalene Colon, NP  Active   ARIPiprazole  (ABILIFY ) 5 MG tablet [id number]  Take 1 tablet (5 mg total) by mouth daily. Rosalene Colon, NP  Active   butalbital -acetaminophen -caffeine  (FIORICET) 50-325-40 MG tablet [id number]  Take 1 tablet by mouth every 6 (six) hours as needed for up to 14 days for headache or migraine (Non-response to tylenol  or ibuprofen ). Rosalene Colon, NP  Active   DULoxetine  (CYMBALTA ) 20 MG capsule [id number]  Take 1 capsule (20 mg total) by mouth daily. Rosalene Colon, NP  Active   hydrochlorothiazide  (HYDRODIURIL ) 25 MG tablet [id number] No Take 1 tablet (25 mg total) by mouth daily. Clem Currier, DO 03/14/2023 Active Self, Pharmacy Records  ibuprofen  (ADVIL ) 400 MG tablet [id number]  Take 1 tablet (400 mg  total) by mouth every 8 (eight) hours as needed for headache or mild pain (pain score 1-3) (Headache not responding to tylenol ). Rosalene Colon, NP  Active   melatonin 5 MG TABS [id number]  Take 1 tablet (5 mg total) by mouth at bedtime. Rosalene Colon, NP  Active   methocarbamol  (ROBAXIN ) 500 MG tablet [id number] No Take 1 tablet (500 mg total) by mouth 2 (two) times daily. Harlow Lighter, [place]  N, FNP 03/14/2023 Active   Mouthwashes (MOUTH RINSE) LIQD solution [id number]  15 mLs by Mouth Rinse route as needed (for oral care). Rosalene Colon, NP  Active   potassium citrate  (UROCIT-K ) 10 MEQ (1080 MG) SR tablet [id number]  Take 1 tablet (10 mEq total) by mouth daily. Rosalene Colon, NP  Active   traZODone  (DESYREL ) 50 MG tablet [id number]  Take 1 tablet (50 mg total) by mouth at bedtime as needed for sleep. Rosalene Colon, NP  Active             Home Care and Equipment/Supplies: Were Home Health Services Ordered?: NA Any new equipment or medical supplies ordered?: NA  Functional Questionnaire: Do you need assistance with bathing/showering or dressing?: No Do you need assistance with meal preparation?: No Do you need assistance with eating?: No Do you have difficulty maintaining continence: No Do you need assistance with getting out of bed/getting out of a chair/moving?: No Do you have difficulty managing or taking your medications?: No  Follow up appointments reviewed: PCP Follow-up appointment confirmed?: Yes Date of PCP follow-up appointment?: 03/28/23 Follow-up Provider: [facility] Follow-up appointment confirmed?: NA Do you need transportation to your follow-up appointment?: No Do you  understand care options if your condition(s) worsen?: Yes-patient verbalized understanding    SIGNATURE Darrall Ellison, LPN [facility] Nurse Health Advisor Direct Dial [phone]

## 2023-03-28 ENCOUNTER — Ambulatory Visit (INDEPENDENT_AMBULATORY_CARE_PROVIDER_SITE_OTHER): Payer: MEDICAID | Admitting: Family Medicine

## 2023-03-28 ENCOUNTER — Encounter: Payer: Self-pay | Admitting: Family Medicine

## 2023-03-28 VITALS — BP 129/86 | HR 64 | Ht 62.0 in | Wt 218.2 lb

## 2023-03-28 DIAGNOSIS — E876 Hypokalemia: Secondary | ICD-10-CM | POA: Diagnosis not present

## 2023-03-28 DIAGNOSIS — I10 Essential (primary) hypertension: Secondary | ICD-10-CM | POA: Diagnosis not present

## 2023-03-28 DIAGNOSIS — R011 Cardiac murmur, unspecified: Secondary | ICD-10-CM | POA: Diagnosis not present

## 2023-03-28 MED ORDER — LOSARTAN POTASSIUM 25 MG PO TABS: 25.0000 mg | ORAL_TABLET | Freq: Every day | ORAL | 3 refills | Status: AC

## 2023-03-28 NOTE — Assessment & Plan Note (Signed)
Patient with very recent history of hypokalemia and currently taking 10 meq supplement of potassium daily.  Her HCTZ could be lowering her potassium, however adding losartan today which can increase it as well.  Will recheck BMP today and again in 1 week then reassess need for potassium supplementation.  Patient understands importance of following up in 1 week for repeat potassium lab draw.

## 2023-03-28 NOTE — Patient Instructions (Signed)
It was great to see you today! Thank you for choosing [facility] for your primary care. Almond Lint was seen for HTN follow up.  Today we addressed: We are starting losartan 25 mg for your blood pressure today.  Please continue to check it at home.  I would like to see you in 1 to 2 weeks for blood pressure follow-up. We are rechecking your potassium today.  Please follow-up in 1 week to check it again since we are starting a new blood pressure medicine that can affect her potassium. Be on the look out for a phone call regarding your echocardiogram of your heart.  We are checking some labs today. I will send you a MyChart message with your results, per your preference. If you do not hear about your labs in the next 2 weeks, please call the office.  PLEASE CALL 24 HOURS PRIOR TO YOUR APPOINTMENT IF YOU NEED TO CANCEL.  Call the clinic at [phone] if your symptoms worsen or you have any concerns.  Please arrive 15 minutes before your appointment to ensure smooth check in process.  We appreciate your efforts in making this happen.  Thank you for allowing me to participate in your care, Para March, DO 03/28/2023, 10:33 AM

## 2023-03-28 NOTE — Progress Notes (Signed)
PCP recommended dismissal from the practice due to multiple missed appointments. The dismissal letter was printed and handed over to Vea to process as a certified letter. I verified the names and addresses on the letters and envelopes for each patient and advised Vea to do the same before mailing the letters.

## 2023-03-28 NOTE — Assessment & Plan Note (Signed)
Stable, ordered echo today for evaluation as patient has never had one.  One was ordered previously but patient did not get it done.

## 2023-03-28 NOTE — Assessment & Plan Note (Signed)
Uncontrolled, initial blood pressure reading 141/88 today (although repeat 129/86).  Given patient has consistently elevated readings while checking at home we will go ahead and add losartan 25 mg on her blood pressure regimen.  Discussed the importance of following up in 2 weeks to recheck her blood pressure and adjust her medication regimen and she agrees. - BMP today - Advised to continue keeping blood pressure log at home

## 2023-03-28 NOTE — Progress Notes (Signed)
    SUBJECTIVE:   CHIEF COMPLAINT / HPI:   Hypertension follow-up -Seen in December 2024 and started on HCTZ 12.5 mg in addition to amlodipine 10 mg. - Hydrochlorothiazide increased to 25 mg last month due to blood pressure still being elevated - Patient states that she has been checking her blood pressure at home and it has been consistently 140s over 90s  [facility] Started on aripiprazole 5mg , sertraline 50mg , fioricet q6h at discharge but pt only tqaking aripiprazole She is currently in intensive outpatient psychotherapy with [facility] and doing well Patient was started on potassium 10 mill equivalents daily due to hypokalemia while hospitalized, her potassium was 2.6 on admission A1c 6.1 on 03/15/23  Of note, had planned to dismiss this patient from the practice due to multiple missed appointments.  Discussed with her today why she is missing appointments and she notes that she has 5 kids and no car, with unreliable transportation.  Discussed that if we keep her in the practice she will need to call 24 hours in advance to cancel her appointments otherwise she will be dismissed immediately if she misses another appointment.  Patient understands and agrees with this plan.  PERTINENT  PMH / PSH: HTN, GERD, anemia, mehorrhagia, MDD with psychosis  OBJECTIVE:   BP 129/86   Pulse 64   Ht 5\' 2"  (1.575 m)   Wt 218 lb 3.2 oz (99 kg)   LMP 03/10/2023 (Exact Date)   SpO2 100%   BMI 39.91 kg/m   Gen: well appearing, pleasant, no acute distress CV: RRR, systolic murmur best heard at left upper sternal border  ASSESSMENT/PLAN:   Essential hypertension, benign Uncontrolled, initial blood pressure reading 141/88 today (although repeat 129/86).  Given patient has consistently elevated readings while checking at home we will go ahead and add losartan 25 mg on her blood pressure regimen.  Discussed the importance of following up in 2 weeks to recheck  her blood pressure and adjust her medication regimen and she agrees. - BMP today - Advised to continue keeping blood pressure log at home  Hypokalemia Patient with very recent history of hypokalemia and currently taking 10 meq supplement of potassium daily.  Her HCTZ could be lowering her potassium, however adding losartan today which can increase it as well.  Will recheck BMP today and again in 1 week then reassess need for potassium supplementation.  Patient understands importance of following up in 1 week for repeat potassium lab draw.  Systolic murmur Stable, ordered echo today for evaluation as patient has never had one.  One was ordered previously but patient did not get it done.     Para March, DO [facility]

## 2023-03-28 NOTE — Progress Notes (Signed)
PCP requested dismissal for multiple no-shows to appointments.

## 2023-03-29 ENCOUNTER — Encounter: Payer: Self-pay | Admitting: Family Medicine

## 2023-03-29 LAB — BASIC METABOLIC PANEL
BUN/Creatinine Ratio: 14 (ref 9–23)
BUN: 12 mg/dL (ref 6–24)
CO2: 25 mmol/L (ref 20–29)
Calcium: 9.3 mg/dL (ref 8.7–10.2)
Chloride: 101 mmol/L (ref 96–106)
Creatinine, Ser: 0.85 mg/dL (ref 0.57–1.00)
Glucose: 100 mg/dL — ABNORMAL HIGH (ref 70–99)
Potassium: 3.7 mmol/L (ref 3.5–5.2)
Sodium: 139 mmol/L (ref 134–144)
eGFR: 87 mL/min/{1.73_m2} (ref >59)

## 2023-03-29 NOTE — Progress Notes (Signed)
PCP discussed dismissal with the patient and decided to cancel dismissal for now. Future no-show to her appointment will lead to dismissal from the practice.

## 2023-03-29 NOTE — Progress Notes (Signed)
Labs normal, will recheck K in a week

## 2023-04-04 ENCOUNTER — Other Ambulatory Visit: Payer: MEDICAID

## 2023-04-04 DIAGNOSIS — E876 Hypokalemia: Secondary | ICD-10-CM

## 2023-04-05 ENCOUNTER — Encounter: Payer: Self-pay | Admitting: Family Medicine

## 2023-04-05 LAB — BASIC METABOLIC PANEL
BUN/Creatinine Ratio: 11 (ref 9–23)
BUN: 9 mg/dL (ref 6–24)
CO2: 22 mmol/L (ref 20–29)
Calcium: 9.4 mg/dL (ref 8.7–10.2)
Chloride: 100 mmol/L (ref 96–106)
Creatinine, Ser: 0.83 mg/dL (ref 0.57–1.00)
Glucose: 99 mg/dL (ref 70–99)
Potassium: 3.9 mmol/L (ref 3.5–5.2)
Sodium: 139 mmol/L (ref 134–144)
eGFR: 89 mL/min/{1.73_m2} (ref >59)

## 2023-04-07 ENCOUNTER — Ambulatory Visit (INDEPENDENT_AMBULATORY_CARE_PROVIDER_SITE_OTHER): Payer: MEDICAID | Admitting: Student

## 2023-04-07 VITALS — BP 125/80 | HR 84 | Temp 98.4°F | Wt 211.6 lb

## 2023-04-07 DIAGNOSIS — G44209 Tension-type headache, unspecified, not intractable: Secondary | ICD-10-CM

## 2023-04-07 MED ORDER — PROCHLORPERAZINE MALEATE 10 MG PO TABS: 10.0000 mg | ORAL_TABLET | Freq: Four times a day (QID) | ORAL | 0 refills | Status: DC | PRN

## 2023-04-07 NOTE — Patient Instructions (Signed)
 Pleasure to meet you today.  Suspect your headache is possibly tension headache or migraine.  I have sent in prescription for Compazine which you will take as needed every 6 hours.  Once your headache improves please make sure to return so that we can start you on a medication to prevent further headache.

## 2023-04-07 NOTE — Assessment & Plan Note (Addendum)
 Suspect tension type headache vs Migraine.  Neurologically intact on exam and no other red flag symptoms.  Head CT from few weeks ago was unremarkable. Given poor response to Aleve and Tylenol, will try compazine and once headache improves patient will return to start propranolol for prophylactic management for possible migraine.  Patient declined Toradol.

## 2023-04-07 NOTE — Progress Notes (Signed)
    SUBJECTIVE:   CHIEF COMPLAINT / HPI:   43 year old female with history of headache presenting today due to persistent headaches.  She says she has had worsening headache for the past 2 days that have been on and off.  Headache starts in the back of the head and progressively moved to the front.  Denies any vision changes, hearing, extremity weakness.  He has tried ibuprofen and Tylenol with no significant relief.  Headaches sometimes wake her up from sleep as she has been having difficulty staying asleep.  PERTINENT  PMH / PSH: Reviewed  OBJECTIVE:   BP 125/80   Pulse 84   Temp 98.4 F (36.9 C)   Wt 211 lb 9.6 oz (96 kg)   LMP 03/10/2023 (Exact Date)   SpO2 98%   BMI 38.70 kg/m    Physical Exam General: Alert, well appearing, NAD Cardiovascular: RRR, No Murmurs, Normal S2/S2 Respiratory: CTAB, No wheezing or Rales Abdomen: No distension or tenderness Extremities: No edema on extremities   Neuro: ANO x 3, cranial nerve II to XII intact, no focal deficits  ASSESSMENT/PLAN:   Tension headache Suspect tension type headache vs Migraine.  Neurologically intact on exam and no other red flag symptoms.  Head CT from few weeks ago was unremarkable. Given poor response to Aleve and Tylenol, will try compazine and once headache improves patient will return to start propranolol for prophylactic management for possible migraine.  Patient declined Toradol.     Jerre Simon, MD [facility]

## 2023-04-08 ENCOUNTER — Ambulatory Visit: Payer: MEDICAID

## 2023-04-08 VITALS — BP 138/86 | HR 90

## 2023-04-08 DIAGNOSIS — I10 Essential (primary) hypertension: Secondary | ICD-10-CM

## 2023-04-08 NOTE — Progress Notes (Signed)
 Patient walks into to [facility] requesting to see a nurse.   She reports she has had a headache (see Elliot Gurney note from 2/27 OV) all day and reports her blood pressure cuff broke. She reports she is on the way to purchase one now, however she asks I check it here. She denies any chest pains, vision changes or shortness of breath.   BP yesterday in office 125/80 with a pulse of 84.  BP today 138/86 with a pulse of 90. She reports she took all 3 blood pressure medications this morning. Amlodipine, hydrochlorothiazide and Losartan.   Patient has a FU visit next week for 3/6.  Patient advised to monitor BP over the weekend and encouraged fluids.   Precautions discussed.   Will forward to PCP.

## 2023-04-10 ENCOUNTER — Encounter (HOSPITAL_COMMUNITY): Payer: Self-pay

## 2023-04-10 ENCOUNTER — Other Ambulatory Visit: Payer: Self-pay

## 2023-04-10 ENCOUNTER — Emergency Department (HOSPITAL_COMMUNITY)
Admission: EM | Admit: 2023-04-10 | Discharge: 2023-04-10 | Disposition: A | Discharge status: Home / Self Care | Payer: MEDICAID | Attending: Emergency Medicine | Admitting: Emergency Medicine

## 2023-04-10 DIAGNOSIS — G44209 Tension-type headache, unspecified, not intractable: Secondary | ICD-10-CM | POA: Diagnosis not present

## 2023-04-10 DIAGNOSIS — R519 Headache, unspecified: Secondary | ICD-10-CM | POA: Diagnosis present

## 2023-04-10 MED ORDER — DIPHENHYDRAMINE HCL 25 MG PO CAPS: 25.0000 mg | ORAL_CAPSULE | Freq: Once | ORAL | Status: DC

## 2023-04-10 MED ORDER — METOCLOPRAMIDE HCL 10 MG PO TABS: 10.0000 mg | ORAL_TABLET | Freq: Once | ORAL | Status: DC

## 2023-04-10 MED ORDER — KETOROLAC TROMETHAMINE 60 MG/2ML IM SOLN
30.0000 mg | Freq: Once | INTRAMUSCULAR | Status: AC
  Administered 2023-04-10: 30 mg via INTRAMUSCULAR
  Filled 2023-04-10: qty 2

## 2023-04-10 NOTE — ED Triage Notes (Signed)
 Pt c/o headache x 4 days. Recently saw PCP for same. Pt denies nausea, vomiting, or photophobia. Pt taking compazine w/o relief.

## 2023-04-10 NOTE — ED Provider Notes (Signed)
 [place] EMERGENCY DEPARTMENT AT [facility] Provider Note   CSN: [id number] Arrival date & time: 04/10/23  0534     History  Chief Complaint  Patient presents with   Headache    Kendra Harris is a 43 y.o. female.  The history is provided by the patient.  Headache Pain location:  Occipital Quality:  Dull Radiates to:  Does not radiate Severity currently:  4/10 Onset quality:  Gradual Duration:  4 days Timing:  Constant Progression:  Waxing and waning Chronicity:  Recurrent Relieved by:  Nothing Worsened by:  Nothing Ineffective treatments:  Prescription medications Associated symptoms: no blurred vision, no facial pain, no fatigue, no fever, no nausea, no seizures, no swollen glands, no syncope, no tingling, no URI, no visual change and no vomiting   Patient with migraines also diagnosed with tension HA presents with 4 days of HA.  Recent normal head CT Past Medical History:  Diagnosis Date   DELAYED MENSES 02/11/2010   Qualifier: Diagnosis of  By: Wallene Huh  MD, Khary     Hypertension    MIGRAINE HEADACHE 12/13/2007   Urinary tract infection 03/04/2011       Home Medications Prior to Admission medications   Medication Sig Start Date End Date Taking? Authorizing Provider  amLODipine (NORVASC) 10 MG tablet Take 1 tablet (10 mg total) by mouth daily. 03/21/23 04/20/23  Myriam Forehand, NP  ARIPiprazole (ABILIFY) 5 MG tablet Take 1 tablet (5 mg total) by mouth daily. 03/20/23 04/19/23  Myriam Forehand, NP  DULoxetine (CYMBALTA) 20 MG capsule Take 1 capsule (20 mg total) by mouth daily. 03/21/23 04/20/23  Myriam Forehand, NP  hydrochlorothiazide (HYDRODIURIL) 25 MG tablet Take 1 tablet (25 mg total) by mouth daily. 03/09/23   Glendale Chard, DO  ibuprofen (ADVIL) 400 MG tablet Take 1 tablet (400 mg total) by mouth every 8 (eight) hours as needed for headache or mild pain (pain score 1-3) (Headache not responding to tylenol). 03/20/23 04/19/23  Myriam Forehand, NP  losartan  (COZAAR) 25 MG tablet Take 1 tablet (25 mg total) by mouth at bedtime. 03/28/23   Everhart, Kirstie, DO  melatonin 5 MG TABS Take 1 tablet (5 mg total) by mouth at bedtime. 03/20/23 04/19/23  Myriam Forehand, NP  Mouthwashes (MOUTH RINSE) LIQD solution 15 mLs by Mouth Rinse route as needed (for oral care). 03/20/23 04/19/23  Myriam Forehand, NP  potassium citrate (UROCIT-K) 10 MEQ (1080 MG) SR tablet Take 1 tablet (10 mEq total) by mouth daily. 03/20/23 04/19/23  Myriam Forehand, NP  prochlorperazine (COMPAZINE) 10 MG tablet Take 1 tablet (10 mg total) by mouth every 6 (six) hours as needed for nausea or vomiting. 04/07/23   Jerre Simon, MD  traZODone (DESYREL) 50 MG tablet Take 1 tablet (50 mg total) by mouth at bedtime as needed for sleep. 03/20/23 04/19/23  Myriam Forehand, NP      Allergies    Patient has no known allergies.    Review of Systems   Review of Systems  Constitutional:  Negative for fatigue and fever.  HENT:  Negative for facial swelling.   Eyes:  Negative for blurred vision.  Respiratory:  Negative for wheezing and stridor.   Cardiovascular:  Negative for syncope.  Gastrointestinal:  Negative for nausea and vomiting.  Neurological:  Positive for headaches. Negative for seizures and speech difficulty.  All other systems reviewed and are negative.   Physical Exam Updated Vital Signs BP (!) 153/98  Pulse 87   Temp 98.4 F (36.9 C) (Oral)   Resp 18   Ht 5\' 2"  (1.575 m)   Wt 95.7 kg   LMP 03/18/2023 (Exact Date)   SpO2 100%   BMI 38.59 kg/m  Physical Exam Vitals and nursing note reviewed.  Constitutional:      General: She is not in acute distress.    Appearance: Normal appearance. She is well-developed.  HENT:     Head: Normocephalic and atraumatic.     Nose: Nose normal.     Mouth/Throat:     Mouth: Mucous membranes are moist.     Pharynx: Oropharynx is clear.  Eyes:     Extraocular Movements: Extraocular movements intact.     Pupils: Pupils are equal, round, and reactive  to light.     Comments: No proptosis, intact cognition.  Disk margins sharp.    Cardiovascular:     Rate and Rhythm: Normal rate and regular rhythm.     Pulses: Normal pulses.     Heart sounds: Normal heart sounds.  Pulmonary:     Effort: Pulmonary effort is normal. No respiratory distress.     Breath sounds: Normal breath sounds.  Abdominal:     General: Bowel sounds are normal. There is no distension.     Palpations: Abdomen is soft.     Tenderness: There is no abdominal tenderness. There is no guarding or rebound.  Musculoskeletal:        General: Normal range of motion.     Cervical back: Neck supple.  Skin:    General: Skin is dry.     Capillary Refill: Capillary refill takes less than 2 seconds.     Findings: No erythema or rash.  Neurological:     General: No focal deficit present.     Mental Status: She is alert and oriented to person, place, and time.     Deep Tendon Reflexes: Reflexes normal.  Psychiatric:        Mood and Affect: Mood normal.     ED Results / Procedures / Treatments   Labs (all labs ordered are listed, but only abnormal results are displayed) Labs Reviewed - No data to display  EKG None  Radiology No results found.  Procedures Procedures    Medications Ordered in ED Medications  ketorolac (TORADOL) injection 30 mg (has no administration in time range)    ED Course/ Medical Decision Making/ A&P                                 Medical Decision Making Patient with occipital headache   Amount and/or Complexity of Data Reviewed External Data Reviewed: notes.    Details: Previous notes reviewed   Risk Prescription drug management. Risk Details: Very well appearing HA 4/10.  No signs of ICH or meningitis.  Highly doubt dural sinus thrombosis.  Disks are sharp, no proptosis intact cognition.  Stable for discharge with close follow up.  Strict returns    Final Clinical Impression(s) / ED Diagnoses Final diagnoses:  Tension headache    No signs of systemic illness or infection. The patient is nontoxic-appearing on exam and vital signs are within normal limits.  I have reviewed the triage vital signs and the nursing notes. Pertinent labs & imaging results that were available during my care of the patient were reviewed by me and considered in my medical decision making (see chart for details). After history,  exam, and medical workup I feel the patient has been appropriately medically screened and is safe for discharge home. Pertinent diagnoses were discussed with the patient. Patient was given return precautions.  Rx / DC Orders ED Discharge Orders     None         Azeem Poorman, MD 04/10/23 [phone]

## 2023-04-11 ENCOUNTER — Encounter (HOSPITAL_BASED_OUTPATIENT_CLINIC_OR_DEPARTMENT_OTHER): Payer: Self-pay

## 2023-04-11 ENCOUNTER — Other Ambulatory Visit: Payer: Self-pay

## 2023-04-11 ENCOUNTER — Emergency Department (HOSPITAL_COMMUNITY): Payer: MEDICAID

## 2023-04-11 ENCOUNTER — Encounter (HOSPITAL_COMMUNITY): Payer: Self-pay | Admitting: Emergency Medicine

## 2023-04-11 ENCOUNTER — Emergency Department (HOSPITAL_COMMUNITY)
Admission: EM | Admit: 2023-04-11 | Discharge: 2023-04-11 | Discharge status: Against Medical Advice | Payer: MEDICAID | Source: Home / Self Care

## 2023-04-11 ENCOUNTER — Emergency Department (HOSPITAL_COMMUNITY)
Admission: EM | Admit: 2023-04-11 | Discharge: 2023-04-11 | Disposition: A | Discharge status: Home / Self Care | Payer: MEDICAID | Attending: Emergency Medicine | Admitting: Emergency Medicine

## 2023-04-11 DIAGNOSIS — R112 Nausea with vomiting, unspecified: Secondary | ICD-10-CM | POA: Insufficient documentation

## 2023-04-11 DIAGNOSIS — R519 Headache, unspecified: Secondary | ICD-10-CM | POA: Diagnosis present

## 2023-04-11 DIAGNOSIS — Z79899 Other long term (current) drug therapy: Secondary | ICD-10-CM | POA: Diagnosis not present

## 2023-04-11 DIAGNOSIS — I10 Essential (primary) hypertension: Secondary | ICD-10-CM | POA: Insufficient documentation

## 2023-04-11 DIAGNOSIS — R079 Chest pain, unspecified: Secondary | ICD-10-CM | POA: Diagnosis present

## 2023-04-11 DIAGNOSIS — M62838 Other muscle spasm: Secondary | ICD-10-CM | POA: Diagnosis not present

## 2023-04-11 LAB — CBC
HCT: 38.6 % (ref 36.0–46.0)
Hemoglobin: 11.8 g/dL — ABNORMAL LOW (ref 12.0–15.0)
MCH: 24.5 pg — ABNORMAL LOW (ref 26.0–34.0)
MCHC: 30.6 g/dL (ref 30.0–36.0)
MCV: 80.1 fL (ref 80.0–100.0)
Platelets: 340 10*3/uL (ref 150–400)
RBC: 4.82 MIL/uL (ref 3.87–5.11)
RDW: 14.3 % (ref 11.5–15.5)
WBC: 7.2 10*3/uL (ref 4.0–10.5)
nRBC: 0 % (ref 0.0–0.2)

## 2023-04-11 LAB — BASIC METABOLIC PANEL
Anion gap: 12 (ref 5–15)
BUN: 6 mg/dL (ref 6–20)
CO2: 29 mmol/L (ref 22–32)
Calcium: 9.3 mg/dL (ref 8.9–10.3)
Chloride: 96 mmol/L — ABNORMAL LOW (ref 98–111)
Creatinine, Ser: 0.84 mg/dL (ref 0.44–1.00)
GFR, Estimated: >60 mL/min (ref >60)
Glucose, Bld: 131 mg/dL — ABNORMAL HIGH (ref 70–99)
Potassium: 2.9 mmol/L — ABNORMAL LOW (ref 3.5–5.1)
Sodium: 137 mmol/L (ref 135–145)

## 2023-04-11 LAB — TROPONIN I (HIGH SENSITIVITY)
Troponin I (High Sensitivity): 6 ng/L (ref <18)
Troponin I (High Sensitivity): 5 ng/L (ref <18)

## 2023-04-11 LAB — HCG, SERUM, QUALITATIVE: Preg, Serum: NEGATIVE

## 2023-04-11 MED ORDER — POTASSIUM CHLORIDE CRYS ER 20 MEQ PO TBCR
40.0000 meq | EXTENDED_RELEASE_TABLET | Freq: Once | ORAL | Status: AC
  Administered 2023-04-11: 40 meq via ORAL
  Filled 2023-04-11: qty 2

## 2023-04-11 MED ORDER — METHOCARBAMOL 500 MG PO TABS: 500.0000 mg | ORAL_TABLET | Freq: Two times a day (BID) | ORAL | 0 refills | Status: DC

## 2023-04-11 MED ORDER — METHOCARBAMOL 500 MG PO TABS
500.0000 mg | ORAL_TABLET | Freq: Once | ORAL | Status: AC
  Administered 2023-04-11: 500 mg via ORAL
  Filled 2023-04-11: qty 1

## 2023-04-11 MED ORDER — IBUPROFEN 800 MG PO TABS
800.0000 mg | ORAL_TABLET | Freq: Once | ORAL | Status: AC
  Administered 2023-04-11: 800 mg via ORAL
  Filled 2023-04-11: qty 1

## 2023-04-11 NOTE — Discharge Instructions (Signed)
 Take the prescribed medication as directed.  Can try using warm compress to area as well. Follow-up with your primary care doctor. Return to the ED for new or worsening symptoms.

## 2023-04-11 NOTE — ED Triage Notes (Signed)
 Pt BIB EMS from home for CP and back pain x 1 hour. Pain increases with movement. Stopped taking muscle relaxer 1 week ago. Hx of HTN, reports medication compliance.  EMS VS 180/100 HR 80 100% RA

## 2023-04-11 NOTE — ED Provider Triage Note (Signed)
 Emergency Medicine Provider Triage Evaluation Note  Kendra Harris , a 43 y.o. female  was evaluated in triage.  Pt complains of chest and left shoulder pain.  States she feels like she has muscle spasms in left shoulder that is transitioning towards the chest.  No SOB, fever, cough, chills.  Seen here yesterday for headache and given Toradol.  No prior cardiac hx..  Review of Systems  Positive: Chest pain, shoulder pain Negative: fever  Physical Exam  BP (!) 149/106   Pulse 75   Temp 98.6 F (37 C) (Oral)   Resp 17   Ht 5\' 2"  (1.575 m)   Wt 95.7 kg   LMP 03/18/2023 (Exact Date)   SpO2 98%   BMI 38.59 kg/m  Gen:   Awake, no distress   Resp:  Normal effort  MSK:   Moves extremities without difficulty  Other:    Medical Decision Making  Medically screening exam initiated at 3:38 AM.  Appropriate orders placed.  Almond Lint was informed that the remainder of the evaluation will be completed by another provider, this initial triage assessment does not replace that evaluation, and the importance of remaining in the ED until their evaluation is complete.  Shoulder/chest pain.  States feels like muscle spasms.  Does have some tenderness along left trapezius.  EKG, labs, CXR.  Ordered robaxin.   Garlon Hatchet, PA-C 04/11/23 [phone]

## 2023-04-11 NOTE — ED Triage Notes (Signed)
 Pt presents via POV c/o headache. Reports had headache x2 days ago and had negative MRI and dx with tension headache however headache returned today. A&O x3. Ambulatory to triage.

## 2023-04-11 NOTE — ED Provider Notes (Signed)
 [place] EMERGENCY DEPARTMENT AT [facility] Provider Note   CSN: [id number] Arrival date & time: 04/11/23  0245     History  Chief Complaint  Patient presents with   Chest Pain    Kendra Harris is a 43 y.o. female.  The history is provided by the patient and medical records.  Chest Pain  43 year old female with history of anemia, anxiety, depression, chronic pain, GERD, presenting to the ED with chest and left shoulder pain.  Patient states she feels like she is having muscle spasms in her left posterior shoulder.  About an hour ago it seemed to radiate into the chest.  She denies any shortness of breath, palpitations, dizziness, weakness, or diaphoresis.  No nausea or vomiting.  She has no prior cardiac history.  She was seen in the ED yesterday for tension type headache and was given Toradol.  She denies any new injury or trauma to the left shoulder.  Home Medications Prior to Admission medications   Medication Sig Start Date End Date Taking? Authorizing Provider  amLODipine (NORVASC) 10 MG tablet Take 1 tablet (10 mg total) by mouth daily. 03/21/23 04/20/23  Myriam Forehand, NP  ARIPiprazole (ABILIFY) 5 MG tablet Take 1 tablet (5 mg total) by mouth daily. 03/20/23 04/19/23  Myriam Forehand, NP  DULoxetine (CYMBALTA) 20 MG capsule Take 1 capsule (20 mg total) by mouth daily. 03/21/23 04/20/23  Myriam Forehand, NP  hydrochlorothiazide (HYDRODIURIL) 25 MG tablet Take 1 tablet (25 mg total) by mouth daily. 03/09/23   Glendale Chard, DO  ibuprofen (ADVIL) 400 MG tablet Take 1 tablet (400 mg total) by mouth every 8 (eight) hours as needed for headache or mild pain (pain score 1-3) (Headache not responding to tylenol). 03/20/23 04/19/23  Myriam Forehand, NP  losartan (COZAAR) 25 MG tablet Take 1 tablet (25 mg total) by mouth at bedtime. 03/28/23   Everhart, Kirstie, DO  melatonin 5 MG TABS Take 1 tablet (5 mg total) by mouth at bedtime. 03/20/23 04/19/23  Myriam Forehand, NP  Mouthwashes (MOUTH  RINSE) LIQD solution 15 mLs by Mouth Rinse route as needed (for oral care). 03/20/23 04/19/23  Myriam Forehand, NP  potassium citrate (UROCIT-K) 10 MEQ (1080 MG) SR tablet Take 1 tablet (10 mEq total) by mouth daily. 03/20/23 04/19/23  Myriam Forehand, NP  prochlorperazine (COMPAZINE) 10 MG tablet Take 1 tablet (10 mg total) by mouth every 6 (six) hours as needed for nausea or vomiting. 04/07/23   Jerre Simon, MD  traZODone (DESYREL) 50 MG tablet Take 1 tablet (50 mg total) by mouth at bedtime as needed for sleep. 03/20/23 04/19/23  Myriam Forehand, NP      Allergies    Patient has no known allergies.    Review of Systems   Review of Systems  Cardiovascular:  Positive for chest pain.  All other systems reviewed and are negative.   Physical Exam Updated Vital Signs BP (!) 149/106   Pulse 75   Temp 98.6 F (37 C) (Oral)   Resp 17   Ht 5\' 2"  (1.575 m)   Wt 95.7 kg   LMP 03/18/2023 (Exact Date)   SpO2 98%   BMI 38.59 kg/m   Physical Exam Vitals and nursing note reviewed.  Constitutional:      Appearance: She is well-developed.  HENT:     Head: Normocephalic and atraumatic.  Eyes:     Conjunctiva/sclera: Conjunctivae normal.     Pupils: Pupils are  equal, round, and reactive to light.  Cardiovascular:     Rate and Rhythm: Normal rate and regular rhythm.     Heart sounds: Normal heart sounds.  Pulmonary:     Effort: Pulmonary effort is normal.     Breath sounds: Normal breath sounds. No wheezing or rhonchi.     Comments: Lungs CTAB Chest:     Comments: Chest wall is nontender, no rash or overlying skin changes Abdominal:     General: Bowel sounds are normal.     Palpations: Abdomen is soft.  Musculoskeletal:        General: Normal range of motion.     Cervical back: Normal range of motion.     Comments: Seems to have some tension/spasm in the left trapezius, this is locally tender to palpation, no midline deformity or step-off  Skin:    General: Skin is warm and dry.  Neurological:      Mental Status: She is alert and oriented to person, place, and time.     ED Results / Procedures / Treatments   Labs (all labs ordered are listed, but only abnormal results are displayed) Labs Reviewed  BASIC METABOLIC PANEL - Abnormal; Notable for the following components:      Result Value   Potassium 2.9 (*)    Chloride 96 (*)    Glucose, Bld 131 (*)    All other components within normal limits  CBC - Abnormal; Notable for the following components:   Hemoglobin 11.8 (*)    MCH 24.5 (*)    All other components within normal limits  HCG, SERUM, QUALITATIVE  TROPONIN I (HIGH SENSITIVITY)  TROPONIN I (HIGH SENSITIVITY)    EKG EKG Interpretation Date/Time:  Monday April 11 2023 02:59:39 EST Ventricular Rate:  80 PR Interval:  196 QRS Duration:  86 QT Interval:  376 QTC Calculation: 433 R Axis:   -4  Text Interpretation: Normal sinus rhythm Septal infarct , age undetermined Abnormal ECG When compared with ECG of 15-Mar-2023 10:11, PREVIOUS ECG IS PRESENT No significant change was found Confirmed by Glynn Octave [id number]) on 04/11/2023 4:02:19 AM  Radiology DG Chest 2 View Result Date: 04/11/2023 CLINICAL DATA:  Chest pain EXAM: CHEST - 2 VIEW COMPARISON:  03/10/2023 FINDINGS: The heart size and mediastinal contours are within normal limits. Both lungs are clear. The visualized skeletal structures are unremarkable. IMPRESSION: No active cardiopulmonary disease. Electronically Signed   By: Minerva Fester M.D.   On: 04/11/2023 03:18    Procedures Procedures    Medications Ordered in ED Medications  methocarbamol (ROBAXIN) tablet 500 mg (500 mg Oral Given 04/11/23 0411)  potassium chloride SA (KLOR-CON M) CR tablet 40 mEq (40 mEq Oral Given 04/11/23 0439)    ED Course/ Medical Decision Making/ A&P                                 Medical Decision Making Amount and/or Complexity of Data Reviewed Labs: ordered. Radiology: ordered and independent interpretation  performed. ECG/medicine tests: ordered and independent interpretation performed.  Risk Prescription drug management.   43 y.o. F here with chest and left shoulder pain.  Feels like spasms in her left posterior shoulder and radiated into the chest about 1 hour ago.  Hx of similar in the past.  Seen yesterday for headache as well.    Afebrile, non-toxic in appearance.  NAD.  Does seem to have some tension/spasm of left trap,  tender to palpation.  No tenderness, deformity, or skin changes to chest wall.  Labs as above-- no leukocytosis, stable anemia.  K+ 2.8, given oral replacement.  First trop is negative. She does have some risk factors (age, smoking, HTN).  Will obtain delta trop.  Also given muscle relaxer here to see if this helps.  Delta troponin remains negative.  Patient requesting to be discharged.  I suspect this is likely muscular in origin.  She has a history of same.  Will prescribe short course of muscle relaxers to see if this helps, also encouraged warm compresses.  She can follow-up with PCP.  Return here for new concerns.  Final Clinical Impression(s) / ED Diagnoses Final diagnoses:  Chest pain, unspecified type  Muscle spasm of left shoulder    Rx / DC Orders ED Discharge Orders          Ordered    methocarbamol (ROBAXIN) 500 MG tablet  2 times daily        04/11/23 0600              Garlon Hatchet, PA-C 04/11/23 1610    Glynn Octave, MD 04/11/23 0700

## 2023-04-12 ENCOUNTER — Ambulatory Visit (INDEPENDENT_AMBULATORY_CARE_PROVIDER_SITE_OTHER): Payer: MEDICAID | Admitting: Student

## 2023-04-12 ENCOUNTER — Encounter: Payer: Self-pay | Admitting: Student

## 2023-04-12 ENCOUNTER — Emergency Department (HOSPITAL_BASED_OUTPATIENT_CLINIC_OR_DEPARTMENT_OTHER)
Admission: EM | Admit: 2023-04-12 | Discharge: 2023-04-12 | Discharge status: Against Medical Advice | Payer: MEDICAID | Source: Home / Self Care | Attending: Emergency Medicine | Admitting: Emergency Medicine

## 2023-04-12 ENCOUNTER — Emergency Department (HOSPITAL_BASED_OUTPATIENT_CLINIC_OR_DEPARTMENT_OTHER): Payer: MEDICAID

## 2023-04-12 VITALS — BP 144/90 | HR 102 | Ht 62.0 in | Wt 211.0 lb

## 2023-04-12 DIAGNOSIS — R519 Headache, unspecified: Secondary | ICD-10-CM | POA: Diagnosis not present

## 2023-04-12 DIAGNOSIS — G44209 Tension-type headache, unspecified, not intractable: Secondary | ICD-10-CM

## 2023-04-12 MED ORDER — TIZANIDINE HCL 4 MG PO TABS: 4.0000 mg | ORAL_TABLET | Freq: Three times a day (TID) | ORAL | 0 refills | Status: DC | PRN

## 2023-04-12 MED ORDER — DEXAMETHASONE SODIUM PHOSPHATE 10 MG/ML IJ SOLN
10.0000 mg | Freq: Once | INTRAMUSCULAR | Status: AC
  Administered 2023-04-12: 10 mg via INTRAMUSCULAR
  Filled 2023-04-12: qty 1

## 2023-04-12 MED ORDER — DIPHENHYDRAMINE HCL 50 MG/ML IJ SOLN
25.0000 mg | Freq: Once | INTRAMUSCULAR | Status: AC
  Administered 2023-04-12: 25 mg via INTRAMUSCULAR
  Filled 2023-04-12: qty 1

## 2023-04-12 MED ORDER — METOCLOPRAMIDE HCL 5 MG/ML IJ SOLN
10.0000 mg | Freq: Once | INTRAMUSCULAR | Status: AC
  Administered 2023-04-12: 10 mg via INTRAMUSCULAR
  Filled 2023-04-12: qty 2

## 2023-04-12 MED ORDER — HYDROCODONE-ACETAMINOPHEN 5-325 MG PO TABS
1.0000 | ORAL_TABLET | Freq: Once | ORAL | Status: AC
  Administered 2023-04-12: 1 via ORAL
  Filled 2023-04-12: qty 1

## 2023-04-12 MED ORDER — SUMATRIPTAN SUCCINATE 6 MG/0.5ML ~~LOC~~ SOLN
6.0000 mg | Freq: Once | SUBCUTANEOUS | Status: AC
  Administered 2023-04-12: 6 mg via SUBCUTANEOUS

## 2023-04-12 MED ORDER — KETOROLAC TROMETHAMINE 30 MG/ML IJ SOLN
30.0000 mg | Freq: Once | INTRAMUSCULAR | Status: AC
  Administered 2023-04-12: 30 mg via INTRAMUSCULAR
  Filled 2023-04-12: qty 1

## 2023-04-12 MED ORDER — HYDROMORPHONE HCL 1 MG/ML IJ SOLN
1.0000 mg | Freq: Once | INTRAMUSCULAR | Status: AC
  Administered 2023-04-12: 1 mg via INTRAMUSCULAR
  Filled 2023-04-12: qty 1

## 2023-04-12 NOTE — ED Provider Notes (Signed)
 [place] EMERGENCY DEPARTMENT AT [facility] Provider Note   CSN: [id number] Arrival date & time: 04/11/23  2150     History  Chief Complaint  Patient presents with   Headache    Kendra Harris is a 43 y.o. female.  Patient reports right-sided headache intermittently for the past 1 week.  Headache comes and goes.  Headache is gradual onset no thunderclap onset.  She is concerned she has migraines.  Did have recent head CT contrary to triage note is not MRI.  She was diagnosed with tension headache.  Has nausea 1 episode of vomiting today.  No photophobia or phonophobia.  No fever.  No focal weakness, numbness or tingling.  No blood thinner use.  Seen yesterday for chest pain which is now improved. Headache feels similar to previous.  States over-the-counter medications are not working.  Has not seen neurology.  Denies thunderclap onset.  Denies any neck pain or back pain.  Denies any focal weakness, numbness or tingling.  No visual changes.  The history is provided by the patient.  Headache Associated symptoms: nausea and vomiting   Associated symptoms: no abdominal pain, no congestion, no cough, no dizziness, no fever, no myalgias, no photophobia and no weakness        Home Medications Prior to Admission medications   Medication Sig Start Date End Date Taking? Authorizing Provider  amLODipine (NORVASC) 10 MG tablet Take 1 tablet (10 mg total) by mouth daily. 03/21/23 04/20/23  Myriam Forehand, NP  ARIPiprazole (ABILIFY) 5 MG tablet Take 1 tablet (5 mg total) by mouth daily. 03/20/23 04/19/23  Myriam Forehand, NP  DULoxetine (CYMBALTA) 20 MG capsule Take 1 capsule (20 mg total) by mouth daily. 03/21/23 04/20/23  Myriam Forehand, NP  hydrochlorothiazide (HYDRODIURIL) 25 MG tablet Take 1 tablet (25 mg total) by mouth daily. 03/09/23   Glendale Chard, DO  ibuprofen (ADVIL) 400 MG tablet Take 1 tablet (400 mg total) by mouth every 8 (eight) hours as needed for headache or mild pain  (pain score 1-3) (Headache not responding to tylenol). 03/20/23 04/19/23  Myriam Forehand, NP  losartan (COZAAR) 25 MG tablet Take 1 tablet (25 mg total) by mouth at bedtime. 03/28/23   Everhart, Kirstie, DO  melatonin 5 MG TABS Take 1 tablet (5 mg total) by mouth at bedtime. 03/20/23 04/19/23  Myriam Forehand, NP  methocarbamol (ROBAXIN) 500 MG tablet Take 1 tablet (500 mg total) by mouth 2 (two) times daily. 04/11/23   Garlon Hatchet, PA-C  Mouthwashes (MOUTH RINSE) LIQD solution 15 mLs by Mouth Rinse route as needed (for oral care). 03/20/23 04/19/23  Myriam Forehand, NP  potassium citrate (UROCIT-K) 10 MEQ (1080 MG) SR tablet Take 1 tablet (10 mEq total) by mouth daily. 03/20/23 04/19/23  Myriam Forehand, NP  prochlorperazine (COMPAZINE) 10 MG tablet Take 1 tablet (10 mg total) by mouth every 6 (six) hours as needed for nausea or vomiting. 04/07/23   Jerre Simon, MD  traZODone (DESYREL) 50 MG tablet Take 1 tablet (50 mg total) by mouth at bedtime as needed for sleep. 03/20/23 04/19/23  Myriam Forehand, NP      Allergies    Patient has no known allergies.    Review of Systems   Review of Systems  Constitutional:  Negative for activity change, appetite change and fever.  HENT:  Negative for congestion and sinus pain.   Eyes:  Negative for photophobia and visual disturbance.  Respiratory:  Negative  for cough, chest tightness and shortness of breath.   Cardiovascular:  Negative for chest pain.  Gastrointestinal:  Positive for nausea and vomiting. Negative for abdominal pain.  Genitourinary:  Negative for dysuria and hematuria.  Musculoskeletal:  Negative for arthralgias and myalgias.  Skin:  Negative for rash.  Neurological:  Positive for headaches. Negative for dizziness, weakness and light-headedness.   all other systems are negative except as noted in the HPI and PMH.    Physical Exam Updated Vital Signs BP 115/89 (BP Location: Left Arm)   Pulse 73   Temp 98.1 F (36.7 C) (Oral)   Resp 17   LMP  03/18/2023 (Exact Date)   SpO2 100%  Physical Exam Vitals and nursing note reviewed.  Constitutional:      General: She is not in acute distress.    Appearance: She is well-developed.  HENT:     Head: Normocephalic and atraumatic.     Mouth/Throat:     Pharynx: No oropharyngeal exudate.  Eyes:     Conjunctiva/sclera: Conjunctivae normal.     Pupils: Pupils are equal, round, and reactive to light.  Neck:     Comments: No meningismus. Cardiovascular:     Rate and Rhythm: Normal rate and regular rhythm.     Heart sounds: Normal heart sounds. No murmur heard. Pulmonary:     Effort: Pulmonary effort is normal. No respiratory distress.     Breath sounds: Normal breath sounds.  Abdominal:     Palpations: Abdomen is soft.     Tenderness: There is no abdominal tenderness. There is no guarding or rebound.  Musculoskeletal:        General: No tenderness. Normal range of motion.     Cervical back: Normal range of motion and neck supple.  Skin:    General: Skin is warm.  Neurological:     Mental Status: She is alert and oriented to person, place, and time.     Cranial Nerves: No cranial nerve deficit.     Motor: No abnormal muscle tone.     Coordination: Coordination normal.     Comments: CN 2-12 intact, no ataxia on finger to nose, no nystagmus, 5/5 strength throughout, no pronator drift, Romberg negative, normal gait.   Psychiatric:        Behavior: Behavior normal.     ED Results / Procedures / Treatments   Labs (all labs ordered are listed, but only abnormal results are displayed) Labs Reviewed - No data to display  EKG None  Radiology CT Head Wo Contrast Result Date: 04/12/2023 CLINICAL DATA:  43 year old female with headache for 2 days. EXAM: CT HEAD WITHOUT CONTRAST TECHNIQUE: Contiguous axial images were obtained from the base of the skull through the vertex without intravenous contrast. RADIATION DOSE REDUCTION: This exam was performed according to the departmental  dose-optimization program which includes automated exposure control, adjustment of the mA and/or kV according to patient size and/or use of iterative reconstruction technique. COMPARISON:  Head CT 02/18/2023. FINDINGS: Brain: Normal cerebral volume. No midline shift, ventriculomegaly, mass effect, evidence of mass lesion, intracranial hemorrhage or evidence of cortically based acute infarction. Gray-white matter differentiation is within normal limits throughout the brain. Empty sella appearance redemonstrated. Vascular: No suspicious intracranial vascular hyperdensity. Skull: Stable, intact. Sinuses/Orbits: Visualized paranasal sinuses and mastoids are stable and well aerated. Other: Visualized scalp soft tissues are within normal limits. IMPRESSION: Stable noncontrast Head CT, negative aside from partially empty sella which is often a normal anatomic variant but can be associated  with idiopathic intracranial hypertension (pseudotumor cerebri). Electronically Signed   By: Odessa Fleming M.D.   On: 04/12/2023 06:07   DG Chest 2 View Result Date: 04/11/2023 CLINICAL DATA:  Chest pain EXAM: CHEST - 2 VIEW COMPARISON:  03/10/2023 FINDINGS: The heart size and mediastinal contours are within normal limits. Both lungs are clear. The visualized skeletal structures are unremarkable. IMPRESSION: No active cardiopulmonary disease. Electronically Signed   By: Minerva Fester M.D.   On: 04/11/2023 03:18    Procedures Procedures    Medications Ordered in ED Medications  ketorolac (TORADOL) 30 MG/ML injection 30 mg (has no administration in time range)  metoCLOPramide (REGLAN) injection 10 mg (has no administration in time range)  diphenhydrAMINE (BENADRYL) injection 25 mg (has no administration in time range)  ibuprofen (ADVIL) tablet 800 mg (800 mg Oral Given 04/11/23 2321)    ED Course/ Medical Decision Making/ A&P                                 Medical Decision Making Amount and/or Complexity of Data  Reviewed Labs: ordered. Decision-making details documented in ED Course. Radiology: ordered and independent interpretation performed. Decision-making details documented in ED Course. ECG/medicine tests: ordered and independent interpretation performed. Decision-making details documented in ED Course.  Risk Prescription drug management.   Intermittent headache for the past 1 week.  Denies thunderclap onset.  Neurological exam is nonfocal.  No difficulty speaking difficulty swallowing.  No fever.  No visual changes.  Low suspicion for subarachnoid hemorrhage, meningitis, temporal arteritis.  Given IM Toradol, Reglan and Benadryl.  Her neurological exam is nonfocal.  Reports no improvement with these medications.  CT head was obtained which is reassuring and stable.  Negative for hemorrhage.  Does show stable empty sella turcica.  Low suspicion for idiopathic intracranial hypertension as she has no visual changes or visual loss. Will need refer to neurology.  Patient eloped from the ED before she be reassessed.        Final Clinical Impression(s) / ED Diagnoses Final diagnoses:  Bad headache    Rx / DC Orders ED Discharge Orders     None         Suzana Sohail, Jeannett Senior, MD 04/12/23 [phone]

## 2023-04-12 NOTE — Patient Instructions (Signed)
 Kendra Harris to see you. SO sorry you're still having these headaches. I am reassured by your imaging from last night. I do think it is important to get you in with neurology.  We are giving you a shot of sumatriptan today. This should help. I am also sending some muscle relaxers to your pharmacy as I think these may be related to tension in your neck. Obviously if things get worse and certainly if you develop vision changes or ringing in your ears, please go to the ER.  I will have our referral coordinator assist in getting you to neurology.  Eliezer Mccoy, MD

## 2023-04-12 NOTE — ED Notes (Signed)
 Pt left before rcving discharge paperwork

## 2023-04-13 ENCOUNTER — Telehealth: Payer: Self-pay

## 2023-04-13 MED ORDER — PROCHLORPERAZINE MALEATE 10 MG PO TABS: 10.0000 mg | ORAL_TABLET | Freq: Three times a day (TID) | ORAL | 0 refills | Status: AC | PRN

## 2023-04-13 MED ORDER — SUMATRIPTAN SUCCINATE 50 MG PO TABS: 50.0000 mg | ORAL_TABLET | ORAL | 0 refills | Status: DC | PRN

## 2023-04-13 NOTE — Telephone Encounter (Signed)
 Patient Kendra Harris on nurse line requesting returned call regarding continued headaches.   Returned call to patient. She reports that headache is worse than yesterday. She states that medication she was given yesterday has not helped at all.   Visual changes- no Vomiting, Nausea: vomiting No ringing in ears.  Denies lightheadedness or dizziness.   She reports that she has Excedrin at home that has not been working.   Dr. Marisue Humble came and spoke with patient on the phone.   Will forward to provider for sending in medications that was discussed with patient.   Veronda Prude, RN

## 2023-04-13 NOTE — Progress Notes (Signed)
 SUBJECTIVE:   CHIEF COMPLAINT / HPI:   Patient seen as a same-day walk in.   Headache Patient is a 43yo with a long-standing history of chronic, episodic headaches.  I saw her for the same and June 2023.  She tells me that the current episode has been going on for about 5 days.  She was actually seen in the ER for the same overnight last night.  She had a CT of the head that was benign.  It does show an empty sella, but this is stable from prior studies.  Lab work was significant only for mild anemia to 11.8 and redemonstrated hypokalemia to 2.9.  Her hypokalemia it is chronic and she is on supplementation, though it is clear if she has been taking this or not.  She received Toradol, Reglan, Benadryl in the ER and then eloped. She tells me that this headache is similar to what I saw her for in 2023 and that it is bilateral and actually worst in the posterior regions.  She denies any phono/photophobia.  No vision changes.  No tinnitus.  No changes in her balance or sensation.  She denies any associated nausea or vomiting.  It is only the headache that is bothering her.  She has been taking Excedrin without relief. No fevers.  She does have a documented history of migraines in the chart, but is not on any migraine-specific medications.  She does acknowledge being under a lot of stress at home and at work. She has five children at home and also works a busy job at [employer]. She has not been able to work over the past few days 2/2 this headache.    OBJECTIVE:   BP (!) 144/90   Pulse (!) 102   Ht 5\' 2"  (1.575 m)   Wt 211 lb (95.7 kg)   LMP 03/18/2023 (Exact Date)   SpO2 96%   BMI 38.59 kg/m   Gen: Tearful on initial assessment but non-toxic and generally well appearing Eyes: PERRLA, EOMs intact, Fundoscopic exam reveals crisp cup/disc margins on the right without papilledema, she did not tolerate non-dilated fundoscopy on the left.  Neck: Supple, no meningismus  Neuro: CN II-XII intact.  Speech is fluent and cognition is normal. Gait is normal as is balance. No dysmetria.   ASSESSMENT/PLAN:   Assessment & Plan Intractable episodic headache, unspecified headache type A bit of an usual presentation. Headache has features of tension HA, but has been refractory to usual treatment. She does have a history of migraine documented in the chart, so it may be worthwhile to trial more directed migraine therapies. IIH is also on the differential given elevated BMI, empty sella on CT, and chronic, episodic headache, though no papilledema on my limited fundoscopic exam and no vision changes or tinnitus to suggest this otherwise. The recurrent and episodic nature of this headache over several years argues against something acute like a dural venous sinus thrombus. CT obtained overnight is helpful in ruling out [facility] or underlying structural abnormality. I strongly suspect there is at least a component of medication overuse at play here as it sounds like she has been using her Excedrin heavily.  - Subcutaneous sumatriptan administered in clinic. Home supply sent to pharmacy as well.  - Have referred to neurology. Defer decision regarding LP for possible IIH to them.  - Can try Zanaflex PRN in case there is a cervicogenic component at play - Refilled her home Compazine as well  Eliezer Mccoy, MD [facility]

## 2023-04-13 NOTE — Assessment & Plan Note (Signed)
 A bit of an usual presentation. Headache has features of tension HA, but has been refractory to usual treatment. She does have a history of migraine documented in the chart, so it may be worthwhile to trial more directed migraine therapies. IIH is also on the differential given elevated BMI, empty sella on CT, and chronic, episodic headache, though no papilledema on my limited fundoscopic exam and no vision changes or tinnitus to suggest this otherwise. The recurrent and episodic nature of this headache over several years argues against something acute like a dural venous sinus thrombus. CT obtained overnight is helpful in ruling out [facility] or underlying structural abnormality. I strongly suspect there is at least a component of medication overuse at play here as it sounds like she has been using her Excedrin heavily.  - Subcutaneous sumatriptan administered in clinic. Home supply sent to pharmacy as well.  - Have referred to neurology. Defer decision regarding LP for possible IIH to them.  - Can try Zanaflex PRN in case there is a cervicogenic component at play - Refilled her home Compazine as well

## 2023-04-14 ENCOUNTER — Ambulatory Visit (INDEPENDENT_AMBULATORY_CARE_PROVIDER_SITE_OTHER): Payer: MEDICAID | Admitting: Student

## 2023-04-14 VITALS — BP 116/70 | HR 79 | Temp 98.1°F | Wt 210.8 lb

## 2023-04-14 DIAGNOSIS — G44209 Tension-type headache, unspecified, not intractable: Secondary | ICD-10-CM

## 2023-04-14 DIAGNOSIS — F322 Major depressive disorder, single episode, severe without psychotic features: Secondary | ICD-10-CM | POA: Diagnosis not present

## 2023-04-14 DIAGNOSIS — E876 Hypokalemia: Secondary | ICD-10-CM

## 2023-04-14 DIAGNOSIS — I10 Essential (primary) hypertension: Secondary | ICD-10-CM | POA: Diagnosis not present

## 2023-04-14 DIAGNOSIS — G894 Chronic pain syndrome: Secondary | ICD-10-CM | POA: Diagnosis not present

## 2023-04-14 DIAGNOSIS — F32A Depression, unspecified: Secondary | ICD-10-CM

## 2023-04-14 DIAGNOSIS — F419 Anxiety disorder, unspecified: Secondary | ICD-10-CM

## 2023-04-14 MED ORDER — HYDROCHLOROTHIAZIDE 25 MG PO TABS: 25.0000 mg | ORAL_TABLET | Freq: Every day | ORAL | 3 refills | Status: DC

## 2023-04-14 MED ORDER — DULOXETINE HCL 40 MG PO CPEP: 40.0000 mg | ORAL_CAPSULE | Freq: Every day | ORAL | 0 refills | Status: DC

## 2023-04-14 NOTE — Assessment & Plan Note (Signed)
 Anxiety is not well-controlled on current regimen.  Will increase Cymbalta to 40 mg daily.  Patient will follow-up in at least 2 to 4 weeks to determine next steps.

## 2023-04-14 NOTE — Assessment & Plan Note (Signed)
 Presentation is consistent with a tension type headache.  Suspect that underlying psychiatric diagnosis of anxiety is contributing to the headache.  Instructed patient to continue Imitrex, Compazine, tizanidine as needed and to give the headache time to resolve.  Patient will return in 2 weeks if she has had no improvement.

## 2023-04-14 NOTE — Progress Notes (Signed)
    SUBJECTIVE:   CHIEF COMPLAINT / HPI:   Kendra Harris is a 43 y.o. female  presenting for follow-up of tension headache and anxiety depression.  Tension headache: Recently started 1 week ago had an ER visit on 04/12/2023 with a CT his head scan that was negative for acute intracranial abnormality.  Patient was given muscle relaxer, Compazine, Imitrex.  She has tried all these medications and has not had significant relief.  She reports she keeps thinking in her head that she is going to die.   Anxiety/depression: Patient is on Abilify 5 mg and duloxetine 20 mg.  She reports good compliance to the medications however she reports her anxiety is still very much out of control and she becomes very anxious when she has a headache.  She denies thoughts of wishing she has better off dead or hurting herself.  PERTINENT  PMH / PSH: Reviewed and updated   OBJECTIVE:   BP 116/70   Pulse 79   Temp 98.1 F (36.7 C)   Wt 210 lb 12.8 oz (95.6 kg)   LMP 03/18/2023 (Exact Date)   SpO2 99%   BMI 38.56 kg/m   Well-appearing, no acute distress Cardio: Regular rate, regular rhythm, no murmurs on exam. Pulm: Clear, no wheezing, no crackles. No increased work of breathing Abdominal: bowel sounds present, soft, non-tender, non-distended Extremities: no peripheral edema  Neuro: alert and oriented x3, speech normal in content, no facial asymmetry, strength intact and equal bilaterally in UE and LE, pupils equal and reactive to light.      04/14/2023    9:08 AM 03/28/2023   10:43 AM 03/09/2023    9:31 AM  PHQ9 SCORE ONLY  PHQ-9 Total Score 0 0 0      ASSESSMENT/PLAN:   Tension headache Presentation is consistent with a tension type headache.  Suspect that underlying psychiatric diagnosis of anxiety is contributing to the headache.  Instructed patient to continue Imitrex, Compazine, tizanidine as needed and to give the headache time to resolve.  Patient will return in 2 weeks if she has had no  improvement.  Anxiety and depression Anxiety is not well-controlled on current regimen.  Will increase Cymbalta to 40 mg daily.  Patient will follow-up in at least 2 to 4 weeks to determine next steps.     Glendale Chard, DO [facility]

## 2023-04-14 NOTE — Patient Instructions (Signed)
 I am increasing your Cymbalta to 40 mg tablets. You can double up your current 20 mg tablets until you get the new prescription.   Please follow up in 4 weeks.

## 2023-04-15 ENCOUNTER — Emergency Department (HOSPITAL_COMMUNITY)
Admission: EM | Admit: 2023-04-15 | Discharge: 2023-04-15 | Disposition: A | Discharge status: Home / Self Care | Payer: MEDICAID | Attending: Emergency Medicine | Admitting: Emergency Medicine

## 2023-04-15 ENCOUNTER — Encounter (HOSPITAL_COMMUNITY): Payer: Self-pay

## 2023-04-15 ENCOUNTER — Ambulatory Visit: Payer: MEDICAID | Admitting: Student

## 2023-04-15 ENCOUNTER — Emergency Department (HOSPITAL_COMMUNITY): Payer: MEDICAID

## 2023-04-15 ENCOUNTER — Ambulatory Visit (HOSPITAL_COMMUNITY)
Admission: EM | Admit: 2023-04-15 | Discharge: 2023-04-15 | Disposition: A | Discharge status: Home / Self Care | Payer: MEDICAID | Attending: Family Medicine | Admitting: Family Medicine

## 2023-04-15 ENCOUNTER — Encounter: Payer: Self-pay | Admitting: Student

## 2023-04-15 ENCOUNTER — Other Ambulatory Visit: Payer: Self-pay

## 2023-04-15 VITALS — BP 133/91 | HR 92 | Ht 62.0 in | Wt 209.8 lb

## 2023-04-15 DIAGNOSIS — Z79899 Other long term (current) drug therapy: Secondary | ICD-10-CM | POA: Insufficient documentation

## 2023-04-15 DIAGNOSIS — R519 Headache, unspecified: Secondary | ICD-10-CM | POA: Diagnosis present

## 2023-04-15 DIAGNOSIS — Z6838 Body mass index (BMI) 38.0-38.9, adult: Secondary | ICD-10-CM | POA: Diagnosis not present

## 2023-04-15 DIAGNOSIS — I10 Essential (primary) hypertension: Secondary | ICD-10-CM | POA: Insufficient documentation

## 2023-04-15 DIAGNOSIS — E669 Obesity, unspecified: Secondary | ICD-10-CM | POA: Insufficient documentation

## 2023-04-15 LAB — CBC WITH DIFFERENTIAL/PLATELET
Abs Immature Granulocytes: 0.02 10*3/uL (ref 0.00–0.07)
Basophils Absolute: 0.1 10*3/uL (ref 0.0–0.1)
Basophils Relative: 1 %
Eosinophils Absolute: 0 10*3/uL (ref 0.0–0.5)
Eosinophils Relative: 0 %
HCT: 36.2 % (ref 36.0–46.0)
Hemoglobin: 11.1 g/dL — ABNORMAL LOW (ref 12.0–15.0)
Immature Granulocytes: 0 %
Lymphocytes Relative: 16 %
Lymphs Abs: 1.5 10*3/uL (ref 0.7–4.0)
MCH: 24.7 pg — ABNORMAL LOW (ref 26.0–34.0)
MCHC: 30.7 g/dL (ref 30.0–36.0)
MCV: 80.6 fL (ref 80.0–100.0)
Monocytes Absolute: 0.7 10*3/uL (ref 0.1–1.0)
Monocytes Relative: 8 %
Neutro Abs: 7.4 10*3/uL (ref 1.7–7.7)
Neutrophils Relative %: 75 %
Platelets: 345 10*3/uL (ref 150–400)
RBC: 4.49 MIL/uL (ref 3.87–5.11)
RDW: 14.6 % (ref 11.5–15.5)
WBC: 9.7 10*3/uL (ref 4.0–10.5)
nRBC: 0 % (ref 0.0–0.2)

## 2023-04-15 LAB — COMPREHENSIVE METABOLIC PANEL
ALT: 21 U/L (ref 0–44)
AST: 19 U/L (ref 15–41)
Albumin: 3.7 g/dL (ref 3.5–5.0)
Alkaline Phosphatase: 63 U/L (ref 38–126)
Anion gap: 12 (ref 5–15)
BUN: 14 mg/dL (ref 6–20)
CO2: 25 mmol/L (ref 22–32)
Calcium: 9.2 mg/dL (ref 8.9–10.3)
Chloride: 102 mmol/L (ref 98–111)
Creatinine, Ser: 0.93 mg/dL (ref 0.44–1.00)
GFR, Estimated: >60 mL/min (ref >60)
Glucose, Bld: 109 mg/dL — ABNORMAL HIGH (ref 70–99)
Potassium: 3.2 mmol/L — ABNORMAL LOW (ref 3.5–5.1)
Sodium: 139 mmol/L (ref 135–145)
Total Bilirubin: 0.6 mg/dL (ref 0.0–1.2)
Total Protein: 7.5 g/dL (ref 6.5–8.1)

## 2023-04-15 LAB — BASIC METABOLIC PANEL
BUN/Creatinine Ratio: 18 (ref 9–23)
BUN: 17 mg/dL (ref 6–24)
CO2: 23 mmol/L (ref 20–29)
Calcium: 9.4 mg/dL (ref 8.7–10.2)
Chloride: 99 mmol/L (ref 96–106)
Creatinine, Ser: 0.96 mg/dL (ref 0.57–1.00)
Glucose: 134 mg/dL — ABNORMAL HIGH (ref 70–99)
Potassium: 3.8 mmol/L (ref 3.5–5.2)
Sodium: 138 mmol/L (ref 134–144)
eGFR: 75 mL/min/{1.73_m2} (ref >59)

## 2023-04-15 LAB — HCG, SERUM, QUALITATIVE: Preg, Serum: NEGATIVE

## 2023-04-15 MED ORDER — LIDOCAINE 5 % EX PTCH
1.0000 | MEDICATED_PATCH | Freq: Once | CUTANEOUS | Status: DC
  Administered 2023-04-15: 1 via TRANSDERMAL
  Filled 2023-04-15: qty 1

## 2023-04-15 MED ORDER — ACETAMINOPHEN 500 MG PO TABS
1000.0000 mg | ORAL_TABLET | Freq: Once | ORAL | Status: AC
  Administered 2023-04-15: 1000 mg via ORAL
  Filled 2023-04-15: qty 2

## 2023-04-15 MED ORDER — KETOROLAC TROMETHAMINE 60 MG/2ML IM SOLN
INTRAMUSCULAR | Status: AC
  Filled 2023-04-15: qty 2

## 2023-04-15 MED ORDER — POTASSIUM CHLORIDE CRYS ER 20 MEQ PO TBCR
40.0000 meq | EXTENDED_RELEASE_TABLET | Freq: Once | ORAL | Status: DC
Start: 2023-04-15 — End: 2023-04-15

## 2023-04-15 MED ORDER — DEXAMETHASONE 4 MG PO TABS: 8.0000 mg | ORAL_TABLET | Freq: Once | ORAL | 0 refills | Status: AC

## 2023-04-15 MED ORDER — BUTALBITAL-APAP-CAFFEINE 50-325-40 MG PO TABS
1.0000 | ORAL_TABLET | Freq: Four times a day (QID) | ORAL | 0 refills | Status: DC | PRN
Start: 2023-04-15 — End: 2023-06-10

## 2023-04-15 MED ORDER — DIPHENHYDRAMINE HCL 25 MG PO CAPS
25.0000 mg | ORAL_CAPSULE | Freq: Once | ORAL | Status: AC
  Administered 2023-04-15: 25 mg via ORAL
  Filled 2023-04-15: qty 1

## 2023-04-15 MED ORDER — KETOROLAC TROMETHAMINE 60 MG/2ML IM SOLN
60.0000 mg | Freq: Once | INTRAMUSCULAR | Status: AC
Start: 2023-04-15 — End: 2023-04-15
  Administered 2023-04-15: 60 mg via INTRAMUSCULAR

## 2023-04-15 MED ORDER — PROCHLORPERAZINE MALEATE 5 MG PO TABS
10.0000 mg | ORAL_TABLET | Freq: Once | ORAL | Status: AC
  Administered 2023-04-15: 10 mg via ORAL
  Filled 2023-04-15: qty 2

## 2023-04-15 NOTE — Discharge Instructions (Signed)
 It was a pleasure caring for you today in the emergency department.  Be sure to get plenty of rest over the next few days, drink plenty of fluids, avoid stressors or headache triggers. Avoid excessive caffeine, avoid alcohol/tobacco. Follow up with your pcp for recheck in the next week.   Please return to the emergency department for any worsening or worrisome symptoms.

## 2023-04-15 NOTE — Discharge Instructions (Signed)
 You have been given a shot of Toradol 60 mg today.  Is treatment does not help your headache improved, please go to the emergency room for further evaluation

## 2023-04-15 NOTE — ED Triage Notes (Signed)
 Pt c/o headache x1wk. States seen and tx'd in ED 3 days ago and seen and tx'd by PCP today. States went to [facility] doctor today and it was clear. States her CT was clear. States has an appt with neurologist in July. States pain is worse and can't sleep.

## 2023-04-15 NOTE — Progress Notes (Signed)
    SUBJECTIVE:   CHIEF COMPLAINT / HPI: Headache  Has been seen multiple times for persistent headache  Seen initially on 2/27 for tension type headache 3/4 had CT head in ED which showed the empty sella ?  IIH Seen in clinic 3/5 for intractable headache and was given sumatriptan hand in clinic and referred to neurology as well as Zanaflex and Compazine Seen in clinic again 3/6 thought to be mostly tension headache/uncontrolled anxiety/depression and Cymbalta was increased  Here again today due to severe headache that has been continuing The patient presents with severe headaches, described as a sensation of pressure  The onset of the headaches is 1 week ago.  She denies any associated vision changes or blurry vision.  She has been taking prescribed medication for the headaches, but not helping.  The patient also reports shoulder pain, but it is unclear if this is related to the headaches.  The patient has not experienced any vomiting since last week. She has a scheduled neurology appointment in July and has not been contacted by ophthalmology. Has not slept in 4 days.  PERTINENT  PMH / PSH: reviewed  OBJECTIVE:   BP (!) 133/91   Pulse 92   Ht 5\' 2"  (1.575 m)   Wt 209 lb 12.8 oz (95.2 kg)   LMP 03/18/2023 (Exact Date)   SpO2 99%   BMI 38.37 kg/m   General: tearful, awake, alert, responsive to questions Head: Normocephalic atraumatic Respiratory: chest rises symmetrically,  no increased work of breathing Extremities: Moves upper and lower extremities freely Neuro: CN II: PERRL CN III, IV,VI: EOMI CV V: Normal sensation in V1, V2, V3 CVII: Symmetric smile and brow raise CN VIII: Normal hearing CN IX,X: Symmetric palate raise  CN XI: 5/5 shoulder shrug CN XII: Symmetric tongue protrusion  UE and LE strength 5/5 Normal sensation in UE and LE bilaterally  No ataxia with finger to nose  ASSESSMENT/PLAN:   Assessment & Plan Intractable episodic headache, unspecified  headache type Continues to have severe headache, possible concern for IIH on CT. Benign neurologic examination and no red flags (no vomiting, vision changes). I called and spoke with on-call ophthalmologist Dr. Dione Booze who would be able to see patient today before 1 PM.  Patient instructed of address and how to get there. -Patient to drive to see ophthalmology for fundal examination -ED/return precautions discussed-if unable to get in with ophthalmology today recommended ED follow-up for lumbar puncture for diagnosis -Continue with medications previously prescribed for anxiety/headaches   Levin Erp, MD [facility]

## 2023-04-15 NOTE — ED Provider Triage Note (Signed)
 Emergency Medicine Provider Triage Evaluation Note  Kendra Harris , a 43 y.o. female  was evaluated in triage.  Pt complains of headache. Patient reports persistent headache for the past week. History of headaches in the past but this feels worse. Has tried extra strength tylenol, a toradol shot, and sumatriptan without improvement.  Review of Systems  Positive: N/V, headache at back of head Negative: Vision changes, weakness, head injuries, blood thinners  Physical Exam  BP (!) 137/91 (BP Location: Right Arm)   Pulse 67   Temp 98.1 F (36.7 C) (Oral)   Resp 15   Ht 5\' 2"  (1.575 m)   Wt 95.7 kg   LMP 04/13/2023 (Exact Date)   SpO2 98%   BMI 38.59 kg/m  Gen:   Awake, no distress   Resp:  Normal effort  MSK:   Moves extremities without difficulty  Other:    Medical Decision Making  Medically screening exam initiated at 6:33 PM.  Appropriate orders placed.  Almond Lint was informed that the remainder of the evaluation will be completed by another provider, this initial triage assessment does not replace that evaluation, and the importance of remaining in the ED until their evaluation is complete.    Maxwell Marion, PA-C 04/15/23 1836

## 2023-04-15 NOTE — ED Provider Notes (Signed)
 [facility]    CSN: [id number] Arrival date & time: 04/15/23  1622      History   Chief Complaint Chief Complaint  Patient presents with   Headache    HPI Kendra Harris is a 43 y.o. female.    Headache Here for severe bilateral headache.  This began about 1 week ago.  She was seen in the ER and then is seen her primary care office twice this week.  She also was evaluated by an eye doctor and her eye exam was good.  Apparently there was no sign of papilledema on his exam.  She did have a CT in the ER that was clear except for possible empty sella, though that brought up the question of possible intracranial hypertension.  Toradol 30 mg has not been effective and she did get Decadron with Toradol a couple days ago.  Past Medical History:  Diagnosis Date   DELAYED MENSES 02/11/2010   Qualifier: Diagnosis of  By: Wallene Huh  MD, Khary     Hypertension    MIGRAINE HEADACHE 12/13/2007   Urinary tract infection 03/04/2011    Patient Active Problem List   Diagnosis Date Noted   Chronic pain syndrome 03/20/2023   Severe recurrent depression with psychosis (HCC) 03/16/2023   MDD (major depressive disorder), severe (HCC) 03/15/2023   Systolic murmur 01/14/2023   Desire for pregnancy 01/14/2023   Epidermoid cyst of skin of chest 04/29/2022   Trapezius muscle strain 02/07/2022   GERD (gastroesophageal reflux disease) 05/07/2021   Tension headache 05/07/2021   Hypokalemia 03/04/2021   Anemia 04/11/2019   Menorrhagia 04/27/2013   Anxiety and depression 07/24/2010   Back pain 02/11/2010   Essential hypertension, benign 08/31/2007   Morbid obesity with BMI of 45.0-49.9, adult (HCC) 04/07/2006    Past Surgical History:  Procedure Laterality Date   NO PAST SURGERIES      OB History     Gravida  5   Para  5   Term  5   Preterm  0   AB  0   Living  5      SAB  0   IAB  0   Ectopic  0   Multiple  0   Live Births  5            Home  Medications    Prior to Admission medications   Medication Sig Start Date End Date Taking? Authorizing Provider  amLODipine (NORVASC) 10 MG tablet Take 1 tablet (10 mg total) by mouth daily. 03/21/23 04/20/23  Myriam Forehand, NP  ARIPiprazole (ABILIFY) 5 MG tablet Take 1 tablet (5 mg total) by mouth daily. 03/20/23 04/19/23  Myriam Forehand, NP  DULoxetine 40 MG CPEP Take 1 capsule (40 mg total) by mouth daily. 04/14/23 05/14/23  Glendale Chard, DO  hydrochlorothiazide (HYDRODIURIL) 25 MG tablet Take 1 tablet (25 mg total) by mouth daily. 04/14/23   Glendale Chard, DO  ibuprofen (ADVIL) 400 MG tablet Take 1 tablet (400 mg total) by mouth every 8 (eight) hours as needed for headache or mild pain (pain score 1-3) (Headache not responding to tylenol). 03/20/23 04/19/23  Myriam Forehand, NP  losartan (COZAAR) 25 MG tablet Take 1 tablet (25 mg total) by mouth at bedtime. 03/28/23   Everhart, Kirstie, DO  melatonin 5 MG TABS Take 1 tablet (5 mg total) by mouth at bedtime. 03/20/23 04/19/23  Myriam Forehand, NP  Mouthwashes (MOUTH RINSE) LIQD solution 15 mLs by Mouth  Rinse route as needed (for oral care). 03/20/23 04/19/23  Myriam Forehand, NP  potassium citrate (UROCIT-K) 10 MEQ (1080 MG) SR tablet Take 1 tablet (10 mEq total) by mouth daily. 03/20/23 04/19/23  Myriam Forehand, NP  prochlorperazine (COMPAZINE) 10 MG tablet Take 1 tablet (10 mg total) by mouth every 8 (eight) hours as needed (for headache). 04/13/23   Alicia Amel, MD  SUMAtriptan (IMITREX) 50 MG tablet Take 1 tablet (50 mg total) by mouth every 2 (two) hours as needed for migraine. May repeat in 2 hours if headache persists or recurs. 04/13/23   Alicia Amel, MD  tiZANidine (ZANAFLEX) 4 MG tablet Take 1 tablet (4 mg total) by mouth every 8 (eight) hours as needed for muscle spasms. 04/12/23   Alicia Amel, MD  traZODone (DESYREL) 50 MG tablet Take 1 tablet (50 mg total) by mouth at bedtime as needed for sleep. 03/20/23 04/19/23  Myriam Forehand, NP    Family  History Family History  Problem Relation Age of Onset   Healthy Mother    Sickle cell trait Sister     Social History Social History   Tobacco Use   Smoking status: Never    Passive exposure: Never   Smokeless tobacco: Never  Vaping Use   Vaping status: Never Used  Substance Use Topics   Alcohol use: No   Drug use: No     Allergies   Patient has no known allergies.   Review of Systems Review of Systems  Neurological:  Positive for headaches.     Physical Exam Triage Vital Signs ED Triage Vitals [04/15/23 1649]  Encounter Vitals Group     BP (!) 141/100     Systolic BP Percentile      Diastolic BP Percentile      Pulse Rate 87     Resp 18     Temp (!) 97.4 F (36.3 C)     Temp Source Oral     SpO2 97 %     Weight      Height      Head Circumference      Peak Flow      Pain Score 7     Pain Loc      Pain Education      Exclude from Growth Chart    No data found.  Updated Vital Signs BP (!) 141/100 (BP Location: Left Arm)   Pulse 87   Temp (!) 97.4 F (36.3 C) (Oral)   Resp 18   LMP 04/13/2023 (Exact Date)   SpO2 97%   Visual Acuity Right Eye Distance:   Left Eye Distance:   Bilateral Distance:    Right Eye Near:   Left Eye Near:    Bilateral Near:     Physical Exam Vitals reviewed.  Constitutional:      General: She is not in acute distress.    Appearance: She is not toxic-appearing.  HENT:     Mouth/Throat:     Mouth: Mucous membranes are moist.  Eyes:     Extraocular Movements: Extraocular movements intact.     Conjunctiva/sclera: Conjunctivae normal.     Pupils: Pupils are equal, round, and reactive to light.  Cardiovascular:     Rate and Rhythm: Normal rate and regular rhythm.     Heart sounds: No murmur heard. Pulmonary:     Effort: Pulmonary effort is normal. No respiratory distress.     Breath sounds: No stridor. No wheezing, rhonchi  or rales.  Musculoskeletal:     Cervical back: Neck supple.  Lymphadenopathy:      Cervical: No cervical adenopathy.  Skin:    Capillary Refill: Capillary refill takes less than 2 seconds.     Coloration: Skin is not jaundiced or pale.  Neurological:     General: No focal deficit present.     Mental Status: She is alert and oriented to person, place, and time.     Cranial Nerves: No cranial nerve deficit.     Motor: No weakness.  Psychiatric:        Behavior: Behavior normal.      UC Treatments / Results  Labs (all labs ordered are listed, but only abnormal results are displayed) Labs Reviewed - No data to display  EKG   Radiology No results found.  Procedures Procedures (including critical care time)  Medications Ordered in UC Medications  ketorolac (TORADOL) injection 60 mg (60 mg Intramuscular Given 04/15/23 1724)    Initial Impression / Assessment and Plan / UC Course  I have reviewed the triage vital signs and the nursing notes.  Pertinent labs & imaging results that were available during my care of the patient were reviewed by me and considered in my medical decision making (see chart for details).      Toradol 60 mg was given here in the office.  If this does not relieve her pain, I asked her to go to the emergency room for further evaluation.  There has been mention of considering an LP if she is not improving. Final Clinical Impressions(s) / UC Diagnoses   Final diagnoses:  Headache disorder     Discharge Instructions      You have been given a shot of Toradol 60 mg today.  Is treatment does not help your headache improved, please go to the emergency room for further evaluation     ED Prescriptions   None    PDMP not reviewed this encounter.   Zenia Resides, MD 04/15/23 2012

## 2023-04-15 NOTE — ED Notes (Signed)
 PT declined food or drink and states that her head is still hurting badly.Physician notified.

## 2023-04-15 NOTE — ED Provider Notes (Signed)
 [place] [facility]PARTMENT AT [facility] Provider Note  CSN: [id number] Arrival date & time: 04/15/23 1753  Chief Complaint(s) Headache  HPI Kendra Harris is a 43 y.o. female with past medical history as below, significant for migraine headache, hypertension, depression, chronic pain syndrome, obesity who presents to the ED with complaint of headache  She has been having headache over the past week, she was seen in the ED on 3/2, given Toradol and symptoms improved.  She was seen again on 3/3 with chest pain, muscle spasm to her shoulder.  Discharged in stable condition. was seen again on 3/4 with headache, had negative CT at that time, symptoms improved with analgesic and was discharged in stable condition.  She went to urgent care earlier today and was sent to the ER for further evaluation.  She is here complaining of ongoing headache over the past week, worse that she saw eye doctor in the last week and was told her eye exam was normal there was some's concern for possible IIH on her prior CT.  She has been taking Imitrex without much relief.  She has ongoing headache today, throbbing sensation temporal and occipital.  Headache was not sudden onset or maximal in onset.  She is has no neurologic deficits, no vision or hearing changes, no gait disturbance or report of recent head injuries.  No fevers or IV drug use  Past Medical History Past Medical History:  Diagnosis Date   DELAYED MENSES 02/11/2010   Qualifier: Diagnosis of  By: Wallene Huh  MD, Khary     Hypertension    MIGRAINE HEADACHE 12/13/2007   Urinary tract infection 03/04/2011   Patient Active Problem List   Diagnosis Date Noted   Chronic pain syndrome 03/20/2023   Severe recurrent depression with psychosis (HCC) 03/16/2023   MDD (major depressive disorder), severe (HCC) 03/15/2023   Systolic murmur 01/14/2023   Desire for pregnancy 01/14/2023   Epidermoid cyst of skin of chest 04/29/2022   Trapezius muscle  strain 02/07/2022   GERD (gastroesophageal reflux disease) 05/07/2021   Tension headache 05/07/2021   Hypokalemia 03/04/2021   Anemia 04/11/2019   Menorrhagia 04/27/2013   Anxiety and depression 07/24/2010   Back pain 02/11/2010   Essential hypertension, benign 08/31/2007   Morbid obesity with BMI of 45.0-49.9, adult (HCC) 04/07/2006   Home Medication(s) Prior to Admission medications   Medication Sig Start Date End Date Taking? Authorizing Provider  butalbital-acetaminophen-caffeine (FIORICET) 50-325-40 MG tablet Take 1 tablet by mouth every 6 (six) hours as needed for headache. 04/15/23  Yes Tanda Rockers A, DO  dexamethasone (DECADRON) 4 MG tablet Take 2 tablets (8 mg total) by mouth once for 1 dose. 04/16/23 04/16/23 Yes Tanda Rockers A, DO  amLODipine (NORVASC) 10 MG tablet Take 1 tablet (10 mg total) by mouth daily. 03/21/23 04/20/23  Myriam Forehand, NP  ARIPiprazole (ABILIFY) 5 MG tablet Take 1 tablet (5 mg total) by mouth daily. 03/20/23 04/19/23  Myriam Forehand, NP  DULoxetine 40 MG CPEP Take 1 capsule (40 mg total) by mouth daily. 04/14/23 05/14/23  Glendale Chard, DO  hydrochlorothiazide (HYDRODIURIL) 25 MG tablet Take 1 tablet (25 mg total) by mouth daily. 04/14/23   Glendale Chard, DO  ibuprofen (ADVIL) 400 MG tablet Take 1 tablet (400 mg total) by mouth every 8 (eight) hours as needed for headache or mild pain (pain score 1-3) (Headache not responding to tylenol). 03/20/23 04/19/23  Myriam Forehand, NP  losartan (COZAAR) 25 MG tablet Take 1  tablet (25 mg total) by mouth at bedtime. 03/28/23   Everhart, Kirstie, DO  melatonin 5 MG TABS Take 1 tablet (5 mg total) by mouth at bedtime. 03/20/23 04/19/23  Myriam Forehand, NP  Mouthwashes (MOUTH RINSE) LIQD solution 15 mLs by Mouth Rinse route as needed (for oral care). 03/20/23 04/19/23  Myriam Forehand, NP  potassium citrate (UROCIT-K) 10 MEQ (1080 MG) SR tablet Take 1 tablet (10 mEq total) by mouth daily. 03/20/23 04/19/23  Myriam Forehand, NP  prochlorperazine (COMPAZINE)  10 MG tablet Take 1 tablet (10 mg total) by mouth every 8 (eight) hours as needed (for headache). 04/13/23   Alicia Amel, MD  SUMAtriptan (IMITREX) 50 MG tablet Take 1 tablet (50 mg total) by mouth every 2 (two) hours as needed for migraine. May repeat in 2 hours if headache persists or recurs. 04/13/23   Alicia Amel, MD  tiZANidine (ZANAFLEX) 4 MG tablet Take 1 tablet (4 mg total) by mouth every 8 (eight) hours as needed for muscle spasms. 04/12/23   Alicia Amel, MD  traZODone (DESYREL) 50 MG tablet Take 1 tablet (50 mg total) by mouth at bedtime as needed for sleep. 03/20/23 04/19/23  Myriam Forehand, NP                                                                                                                                    Past Surgical History Past Surgical History:  Procedure Laterality Date   NO PAST SURGERIES     Family History Family History  Problem Relation Age of Onset   Healthy Mother    Sickle cell trait Sister     Social History Social History   Tobacco Use   Smoking status: Never    Passive exposure: Never   Smokeless tobacco: Never  Vaping Use   Vaping status: Never Used  Substance Use Topics   Alcohol use: No   Drug use: No   Allergies Patient has no known allergies.  Review of Systems A thorough review of systems was obtained and all systems are negative except as noted in the HPI and PMH.   Physical Exam Vital Signs  I have reviewed the triage vital signs BP 133/85 (BP Location: Right Arm)   Pulse 72   Temp 98.5 F (36.9 C) (Oral)   Resp 19   Ht 5\' 2"  (1.575 m)   Wt 95.7 kg   LMP 04/13/2023 (Exact Date)   SpO2 100%   BMI 38.59 kg/m  Physical Exam Vitals and nursing note reviewed.  Constitutional:      General: She is not in acute distress.    Appearance: Normal appearance. She is well-developed. She is obese. She is not ill-appearing.  HENT:     Head: Normocephalic and atraumatic.     Right Ear: External ear normal.     Left  Ear: External ear normal.  Nose: Nose normal.     Mouth/Throat:     Mouth: Mucous membranes are moist.  Eyes:     General: No scleral icterus.       Right eye: No discharge.        Left eye: No discharge.     Extraocular Movements: Extraocular movements intact.     Pupils: Pupils are equal, round, and reactive to light.  Cardiovascular:     Rate and Rhythm: Normal rate.  Pulmonary:     Effort: Pulmonary effort is normal. No respiratory distress.     Breath sounds: No stridor.  Abdominal:     General: Abdomen is flat. There is no distension.     Tenderness: There is no guarding.  Musculoskeletal:        General: No deformity.     Cervical back: Normal range of motion. No rigidity.  Skin:    General: Skin is warm and dry.     Coloration: Skin is not cyanotic, jaundiced or pale.  Neurological:     General: No focal deficit present.     Mental Status: She is alert and oriented to person, place, and time.     GCS: GCS eye subscore is 4. GCS verbal subscore is 5. GCS motor subscore is 6.     Cranial Nerves: Cranial nerves 2-12 are intact. No dysarthria.     Sensory: Sensation is intact.     Motor: Motor function is intact. No weakness or tremor.     Coordination: Coordination is intact. Coordination normal.     Gait: Gait is intact.     Comments: Strength 5/5 to BLUE/BLLE, equal and symmetric    Psychiatric:        Mood and Affect: Affect is tearful.        Speech: Speech normal.        Behavior: Behavior normal. Behavior is cooperative.     ED Results and Treatments Labs (all labs ordered are listed, but only abnormal results are displayed) Labs Reviewed  COMPREHENSIVE METABOLIC PANEL - Abnormal; Notable for the following components:      Result Value   Potassium 3.2 (*)    Glucose, Bld 109 (*)    All other components within normal limits  CBC WITH DIFFERENTIAL/PLATELET - Abnormal; Notable for the following components:   Hemoglobin 11.1 (*)    MCH 24.7 (*)    All  other components within normal limits  HCG, SERUM, QUALITATIVE                                                                                                                          Radiology CT Head Wo Contrast Result Date: 04/15/2023 CLINICAL DATA:  Headache, increasing frequency or severity EXAM: CT HEAD WITHOUT CONTRAST TECHNIQUE: Contiguous axial images were obtained from the base of the skull through the vertex without intravenous contrast. RADIATION DOSE REDUCTION: This exam was performed according to the departmental dose-optimization program which includes automated exposure control, adjustment  of the mA and/or kV according to patient size and/or use of iterative reconstruction technique. COMPARISON:  CT head April 12, 2023. FINDINGS: Brain: No evidence of acute infarction, hemorrhage, hydrocephalus, extra-axial collection or mass lesion/mass effect. Partially empty sella. Vascular: No hyperdense vessel. Skull: No acute fracture. Sinuses/Orbits: Clear sinuses.  No acute orbital findings. Other: No mastoid effusions. IMPRESSION: 1. No evidence of acute intracranial abnormality. 2. Partially empty sella, which is often a normal anatomic variant but can be associated with idiopathic intracranial hypertension. Electronically Signed   By: Feliberto Harts M.D.   On: 04/15/2023 22:28    Pertinent labs & imaging results that were available during my care of the patient were reviewed by me and considered in my medical decision making (see MDM for details).  Medications Ordered in ED Medications  lidocaine (LIDODERM) 5 % 1 patch (1 patch Transdermal Patch Applied 04/15/23 2130)  acetaminophen (TYLENOL) tablet 1,000 mg (1,000 mg Oral Given 04/15/23 2127)  prochlorperazine (COMPAZINE) tablet 10 mg (10 mg Oral Given 04/15/23 2127)  diphenhydrAMINE (BENADRYL) capsule 25 mg (25 mg Oral Given 04/15/23 2127)                                                                                                                                      Procedures Procedures  (including critical care time)  Medical Decision Making / ED Course    Medical Decision Making:    ZHOE CATANIA is a 43 y.o. female with past medical history as below, significant for migraine headache, hypertension, depression, chronic pain syndrome, obesity who presents to the ED with complaint of headache. The complaint involves an extensive differential diagnosis and also carries with it a high risk of complications and morbidity.  Serious etiology was considered. Ddx includes but is not limited to: Differential diagnosis includes but is not exclusive to subarachnoid hemorrhage, meningitis, encephalitis, previous head trauma, cavernous venous thrombosis, muscle tension headache, glaucoma, temporal arteritis, migraine or migraine equivalent, etc.   Complete initial physical exam performed, notably the patient was in no acute distress, she is tearful, neuroexam is nonfocal.    Reviewed and confirmed nursing documentation for past medical history, family history, social history.  Vital signs reviewed.   Clinical Course as of 04/15/23 2308  Fri Apr 15, 2023  2125 Hemoglobin(!): 11.1  Her baseline is around 12 [SG]  2306 She is feeling much better, her symptoms have resolved.  Ambulatory with a steady gait [SG]    Clinical Course User Index [SG] Sloan Leiter, DO    Brief summary: 43 year old female here with ongoing headache over the past week, multiple ER visits for the same.  Labs are obtained in triage, potassium is mildly reduced.  Head CT was also ordered in triage.  Will give migraine cocktail and reassess. Symptoms are not c/w IIH at this time. She was referred to neurology earlier this month by her pcp office.  Patient presents with headache. Based on the patient's history and physical there is very low clinical suspicion for significant intracranial pathology. The headache was not sudden onset, not maximal at onset,  there are no neurologic findings on exam, the patient does not have a fever, the patient does not have any jaw claudication, the patient does not endorse a clotting disorder, patient denies any trauma or eye pain and the headache is not associated with dizziness, weakness on one side of the body, diplopia, vertigo, slurred speech, or ataxia. Given the extremely low risk of these diagnoses further testing and evaluation for these possibilities does not appear to be indicated at this time.   The patient improved significantly and was discharged in stable condition. Detailed discussions were had with the patient regarding current findings, and need for close f/u with PCP or on call doctor. The patient has been instructed to return immediately if the symptoms worsen in any way for re-evaluation. Patient verbalized understanding and is in agreement with current care plan. All questions answered prior to discharge.          Additional history obtained: -Additional history obtained from na -External records from outside source obtained and reviewed including: Chart review including previous notes, labs, imaging, consultation notes including  Recent ER evaluations, recent urgent care documentation, prior imaging, home medications   Lab Tests: -I ordered, reviewed, and interpreted labs.   The pertinent results include:   Labs Reviewed  COMPREHENSIVE METABOLIC PANEL - Abnormal; Notable for the following components:      Result Value   Potassium 3.2 (*)    Glucose, Bld 109 (*)    All other components within normal limits  CBC WITH DIFFERENTIAL/PLATELET - Abnormal; Notable for the following components:   Hemoglobin 11.1 (*)    MCH 24.7 (*)    All other components within normal limits  HCG, SERUM, QUALITATIVE    Notable for potassium is mildly low, hemoglobin mildly low,  EKG   EKG Interpretation Date/Time:    Ventricular Rate:    PR Interval:    QRS Duration:    QT Interval:    QTC  Calculation:   R Axis:      Text Interpretation:           Imaging Studies ordered: CT of the head ordered in triage I independently visualized the following imaging with scope of interpretation limited to determining acute life threatening conditions related to emergency care; findings noted above I independently visualized and interpreted imaging. I agree with the radiologist interpretation   Medicines ordered and prescription drug management: Meds ordered this encounter  Medications   acetaminophen (TYLENOL) tablet 1,000 mg   lidocaine (LIDODERM) 5 % 1 patch   prochlorperazine (COMPAZINE) tablet 10 mg   diphenhydrAMINE (BENADRYL) capsule 25 mg   DISCONTD: potassium chloride SA (KLOR-CON M) CR tablet 40 mEq   dexamethasone (DECADRON) 4 MG tablet    Sig: Take 2 tablets (8 mg total) by mouth once for 1 dose.    Dispense:  2 tablet    Refill:  0   butalbital-acetaminophen-caffeine (FIORICET) 50-325-40 MG tablet    Sig: Take 1 tablet by mouth every 6 (six) hours as needed for headache.    Dispense:  20 tablet    Refill:  0    -I have reviewed the patients home medicines and have made adjustments as needed   Consultations Obtained: na   Cardiac Monitoring: Continuous pulse oximetry interpreted by myself, 98% on RA.  Social Determinants of Health:  Diagnosis or treatment significantly limited by social determinants of health: obese   Reevaluation: After the interventions noted above, I reevaluated the patient and found that they have improved  Co morbidities that complicate the patient evaluation  Past Medical History:  Diagnosis Date   DELAYED MENSES 02/11/2010   Qualifier: Diagnosis of  By: Wallene Huh  MD, Khary     Hypertension    MIGRAINE HEADACHE 12/13/2007   Urinary tract infection 03/04/2011      Dispostion: Disposition decision including need for hospitalization was considered, and patient discharged from emergency department.    Final Clinical  Impression(s) / ED Diagnoses Final diagnoses:  Bad headache        Sloan Leiter, DO 04/15/23 2308

## 2023-04-15 NOTE — Patient Instructions (Addendum)
 It was great to see you! Thank you for allowing me to participate in your care!   Our plans for today:  - Please go to Dr. Laruth Bouchard Office [facility] Doctor) before 1 PM to be seen today [address] [place], [place], [place] [address] -You cannot make it there in time but have to be seen on Monday but since you have been having such severe headaches I would recommend ER evaluation if you are not able to make it into his office for a lumbar puncture  Take care and seek immediate care sooner if you develop any concerns.  Levin Erp, MD

## 2023-04-15 NOTE — ED Triage Notes (Signed)
 Pt here for a HA that started a week ago. HA is back middle. Denies blurred vision. Axox4.

## 2023-04-15 NOTE — ED Notes (Signed)
 PT is c/o sharp pains in the back  of her head and on the right side. PT declined pain medication.

## 2023-04-18 ENCOUNTER — Telehealth: Payer: Self-pay

## 2023-04-18 ENCOUNTER — Ambulatory Visit (HOSPITAL_COMMUNITY): Payer: MEDICAID | Attending: Family Medicine

## 2023-04-18 NOTE — Telephone Encounter (Signed)
 Spoke with patient. Made follow up appt for 4/3 at 8:45a. Aquilla Solian, CMA

## 2023-04-20 ENCOUNTER — Ambulatory Visit: Payer: MEDICAID | Admitting: Neurology

## 2023-04-20 NOTE — Progress Notes (Deleted)
 PRECHARTING. NO SHOWED NEW-PATIENT APPOINTMENT  CC:  headaches  HPI:  Kendra Harris is a 43 y.o. female here as requested by Glynn Octave, MD for headache. has Morbid obesity with BMI of 45.0-49.9, adult (HCC); Essential hypertension, benign; Back pain; Anxiety and depression; Menorrhagia; Anemia; Hypokalemia; GERD (gastroesophageal reflux disease); Tension headache; Trapezius muscle strain; Epidermoid cyst of skin of chest; Systolic murmur; Desire for pregnancy; MDD (major depressive disorder), severe (HCC); Severe recurrent depression with psychosis (HCC); and Chronic pain syndrome on their problem list.  She has been seen at the emergency room including last visit there April 15, 2023 her CT of the head showed partially empty sella.  Per review of Dr. Gwenette Greet note in the emergency room that day she has been having headache, was also seen in the emergency room March 2, given Toradol with symptom improvement, seen again on March 3 with chest pain muscle spasms to her shoulder, seen again March 4 with headache symptoms improved with analgesic then seen again March 7 for headache she had seen an eye doctor who told her exam was normal but concern for intracranial idiopathic hypertension on her prior CAT scan.  Imitrex without relief.  No other neurologic deficits or concerning symptoms.  From a thorough review of records and patient report, medications tried greater than 3 months that can be used in migraine or headache management include: Tylenol, amlodipine (calcium channel blocker similar to verapamil), Abilify, Fioricet, Celexa, clonidine, Flexeril, Benadryl, Cymbalta, Lexapro, Prozac, hydroxyzine, ibuprofen, ketorolac injections, labetalol, losartan, magnesium hydroxide, magnesium oxide, meloxicam, Robaxin, Reglan, naproxen, Zofran, Compazine, propranolol, Zoloft, Imitrex, rizatriptan, Zanaflex, trazodone.  Reviewed notes, labs and imaging from outside physicians, which showed:  04/15/2023:  CLINICAL DATA:  Headache, increasing frequency or severity   EXAM: CT HEAD WITHOUT CONTRAST   TECHNIQUE: Contiguous axial images were obtained from the base of the skull through the vertex without intravenous contrast.   RADIATION DOSE REDUCTION: This exam was performed according to the departmental dose-optimization program which includes automated exposure control, adjustment of the mA and/or kV according to patient size and/or use of iterative reconstruction technique.   COMPARISON:  CT head April 12, 2023.   FINDINGS: Brain: No evidence of acute infarction, hemorrhage, hydrocephalus, extra-axial collection or mass lesion/mass effect. Partially empty sella.   Vascular: No hyperdense vessel.   Skull: No acute fracture.   Sinuses/Orbits: Clear sinuses.  No acute orbital findings.   Other: No mastoid effusions.   IMPRESSION: 1. No evidence of acute intracranial abnormality. 2. Partially empty sella, which is often a normal anatomic variant but can be associated with idiopathic intracranial hypertension.   Recent Results (from the past 2160 hours)  Basic Metabolic Panel     Status: Abnormal   Collection Time: 02/07/23  4:29 PM  Result Value Ref Range   Glucose 108 (H) 70 - 99 mg/dL   BUN 13 6 - 24 mg/dL   Creatinine, Ser 1.61 0.57 - 1.00 mg/dL   eGFR 80 >09 UE/AVW/0.98   BUN/Creatinine Ratio 14 9 - 23   Sodium 139 134 - 144 mmol/L   Potassium 3.5 3.5 - 5.2 mmol/L   Chloride 100 96 - 106 mmol/L   CO2 22 20 - 29 mmol/L   Calcium 9.7 8.7 - 10.2 mg/dL  POC urinalysis dipstick     Status: None   Collection Time: 03/05/23 10:42 AM  Result Value Ref Range   Color, UA yellow yellow   Clarity, UA clear clear   Glucose, UA negative negative mg/dL  Bilirubin, UA negative negative   Ketones, POC UA negative negative mg/dL   Spec Grav, UA 7.425 9.563 - 1.025   Blood, UA negative negative   pH, UA 6.0 5.0 - 8.0   Protein Ur, POC negative negative mg/dL   Urobilinogen, UA  0.2 0.2 or 1.0 E.U./dL   Nitrite, UA Negative Negative   Leukocytes, UA Negative Negative  Basic metabolic panel     Status: Abnormal   Collection Time: 03/08/23  5:30 AM  Result Value Ref Range   Sodium 137 135 - 145 mmol/L   Potassium 3.1 (L) 3.5 - 5.1 mmol/L   Chloride 101 98 - 111 mmol/L   CO2 24 22 - 32 mmol/L   Glucose, Bld 127 (H) 70 - 99 mg/dL    Comment: Glucose reference range applies only to samples taken after fasting for at least 8 hours.   BUN 14 6 - 20 mg/dL   Creatinine, Ser 8.75 0.44 - 1.00 mg/dL   Calcium 9.0 8.9 - 64.3 mg/dL   GFR, Estimated >32 >95 mL/min    Comment: (NOTE) Calculated using the CKD-EPI Creatinine Equation (2021)    Anion gap 12 5 - 15    Comment: Performed at [facility], [address], [place], [place] [address]  CBC     Status: Abnormal   Collection Time: 03/08/23  5:30 AM  Result Value Ref Range   WBC 7.6 4.0 - 10.5 K/uL   RBC 4.98 3.87 - 5.11 MIL/uL   Hemoglobin 12.2 12.0 - 15.0 g/dL   HCT 66.0 63.0 - 16.0 %   MCV 80.3 80.0 - 100.0 fL   MCH 24.5 (L) 26.0 - 34.0 pg   MCHC 30.5 30.0 - 36.0 g/dL   RDW 10.9 32.3 - 55.7 %   Platelets 319 150 - 400 K/uL   nRBC 0.0 0.0 - 0.2 %    Comment: Performed at [facility], [address], [place], [place] [address]  Troponin I (High Sensitivity)     Status: None   Collection Time: 03/08/23  5:30 AM  Result Value Ref Range   Troponin I (High Sensitivity) 3 <18 ng/L    Comment: (NOTE) Elevated high sensitivity troponin I (hsTnI) values and significant  changes across serial measurements may suggest ACS but many other  chronic and acute conditions are known to elevate hsTnI results.  Refer to the "Links" section for chest pain algorithms and additional  guidance. Performed at [facility], [address] [place], [place] [address]   hCG, serum, qualitative     Status: None   Collection Time: 03/08/23  5:30 AM  Result Value Ref Range    Preg, Serum NEGATIVE NEGATIVE    Comment:        THE SENSITIVITY OF THIS METHODOLOGY IS >10 mIU/mL. Performed at [facility], [address] [place], [place] [address]   Resp panel by RT-PCR (RSV, Flu A&B, Covid) Anterior Nasal Swab     Status: None   Collection Time: 03/10/23  4:48 AM   Specimen: Anterior Nasal Swab  Result Value Ref Range   SARS Coronavirus 2 by RT PCR NEGATIVE NEGATIVE   Influenza A by PCR NEGATIVE NEGATIVE   Influenza B by PCR NEGATIVE NEGATIVE    Comment: (NOTE) The Xpert Xpress SARS-CoV-2/FLU/RSV plus assay is intended as an aid in the diagnosis of influenza from Nasopharyngeal swab specimens and should not be used as a sole basis for treatment. Nasal washings  and aspirates are unacceptable for Xpert Xpress SARS-CoV-2/FLU/RSV testing.  Fact Sheet for Patients: BloggerCourse.com  Fact Sheet for Healthcare Providers: SeriousBroker.it  This test is not yet approved or cleared by the [place] FDA and has been authorized for detection and/or diagnosis of SARS-CoV-2 by FDA under an Emergency Use Authorization (EUA). This EUA will remain in effect (meaning this test can be used) for the duration of the COVID-19 declaration under Section 564(b)(1) of the Act, 21 U.S.C. section 360bbb-3(b)(1), unless the authorization is terminated or revoked.     Resp Syncytial Virus by PCR NEGATIVE NEGATIVE    Comment: (NOTE) Fact Sheet for Patients: BloggerCourse.com  Fact Sheet for Healthcare Providers: SeriousBroker.it  This test is not yet approved or cleared by the [place] FDA and has been authorized for detection and/or diagnosis of SARS-CoV-2 by FDA under an Emergency Use Authorization (EUA). This EUA will remain in effect (meaning this test can be used) for the duration of the COVID-19 declaration under Section 564(b)(1) of the Act, 21  U.S.C. section 360bbb-3(b)(1), unless the authorization is terminated or revoked.  Performed at [facility], [address] [place], [place] [address]   Basic metabolic panel     Status: Abnormal   Collection Time: 03/10/23  5:02 AM  Result Value Ref Range   Sodium 135 135 - 145 mmol/L   Potassium 3.3 (L) 3.5 - 5.1 mmol/L   Chloride 98 98 - 111 mmol/L   CO2 24 22 - 32 mmol/L   Glucose, Bld 140 (H) 70 - 99 mg/dL    Comment: Glucose reference range applies only to samples taken after fasting for at least 8 hours.   BUN 8 6 - 20 mg/dL   Creatinine, Ser 6.21 0.44 - 1.00 mg/dL   Calcium 9.1 8.9 - 30.8 mg/dL   GFR, Estimated >65 >78 mL/min    Comment: (NOTE) Calculated using the CKD-EPI Creatinine Equation (2021)    Anion gap 13 5 - 15    Comment: Performed at [facility], [address] [place], [place] [address]  Troponin I (High Sensitivity)     Status: None   Collection Time: 03/10/23  5:02 AM  Result Value Ref Range   Troponin I (High Sensitivity) 4 <18 ng/L    Comment: (NOTE) Elevated high sensitivity troponin I (hsTnI) values and significant  changes across serial measurements may suggest ACS but many other  chronic and acute conditions are known to elevate hsTnI results.  Refer to the "Links" section for chest pain algorithms and additional  guidance. Performed at [facility], [address], [place], [place] [address]   hCG, serum, qualitative     Status: None   Collection Time: 03/10/23  5:02 AM  Result Value Ref Range   Preg, Serum NEGATIVE NEGATIVE    Comment:        THE SENSITIVITY OF THIS METHODOLOGY IS >10 mIU/mL. Performed at [facility], [address], [place], [place] [address]   CBC with Differential     Status: Abnormal   Collection Time: 03/10/23  5:02 AM  Result Value Ref Range   WBC 7.4 4.0 - 10.5 K/uL   RBC 4.98 3.87 - 5.11 MIL/uL   Hemoglobin 12.2 12.0 - 15.0 g/dL   HCT 01.0 27.2 - 53.6 %   MCV 79.7 (L) 80.0 -  100.0 fL   MCH 24.5 (L) 26.0 - 34.0 pg   MCHC 30.7 30.0 - 36.0 g/dL  RDW 14.4 11.5 - 15.5 %   Platelets 320 150 - 400 K/uL   nRBC 0.0 0.0 - 0.2 %   Neutrophils Relative % 63 %   Neutro Abs 4.7 1.7 - 7.7 K/uL   Lymphocytes Relative 24 %   Lymphs Abs 1.8 0.7 - 4.0 K/uL   Monocytes Relative 10 %   Monocytes Absolute 0.7 0.1 - 1.0 K/uL   Eosinophils Relative 1 %   Eosinophils Absolute 0.1 0.0 - 0.5 K/uL   Basophils Relative 1 %   Basophils Absolute 0.1 0.0 - 0.1 K/uL   Immature Granulocytes 1 %   Abs Immature Granulocytes 0.04 0.00 - 0.07 K/uL    Comment: Performed at [facility], [address] [place], [place] [address]  Troponin I (High Sensitivity)     Status: None   Collection Time: 03/10/23  6:52 AM  Result Value Ref Range   Troponin I (High Sensitivity) 3 <18 ng/L    Comment: (NOTE) Elevated high sensitivity troponin I (hsTnI) values and significant  changes across serial measurements may suggest ACS but many other  chronic and acute conditions are known to elevate hsTnI results.  Refer to the "Links" section for chest pain algorithms and additional  guidance. Performed at [facility], [address], [place], [place] [address]   Urinalysis, Complete w Microscopic -Urine, Clean Catch     Status: Abnormal   Collection Time: 03/15/23 10:00 AM  Result Value Ref Range   Color, Urine YELLOW YELLOW   APPearance HAZY (A) CLEAR   Specific Gravity, Urine 1.019 1.005 - 1.030   pH 6.0 5.0 - 8.0   Glucose, UA NEGATIVE NEGATIVE mg/dL   Hgb urine dipstick LARGE (A) NEGATIVE   Bilirubin Urine NEGATIVE NEGATIVE   Ketones, ur NEGATIVE NEGATIVE mg/dL   Protein, ur NEGATIVE NEGATIVE mg/dL   Nitrite NEGATIVE NEGATIVE   Leukocytes,Ua NEGATIVE NEGATIVE   RBC / HPF 0-5 0 - 5 RBC/hpf   WBC, UA 0-5 0 - 5 WBC/hpf   Bacteria, UA NONE SEEN NONE SEEN   Squamous Epithelial / HPF 0-5 0 - 5 /HPF   Mucus PRESENT    Ca Oxalate Crys, UA PRESENT     Comment: Performed at [facility], [address] [place], [place] [address]  POC urine preg, ED     Status: Normal   Collection Time: 03/15/23 10:03 AM  Result Value Ref Range   Preg Test, Ur Negative Negative  POCT Urine Drug Screen - (I-Screen)     Status: Normal   Collection Time: 03/15/23 10:03 AM  Result Value Ref Range   POC Amphetamine UR None Detected NONE DETECTED (Cut Off Level 1000 ng/mL)   POC Secobarbital (BAR) None Detected NONE DETECTED (Cut Off Level 300 ng/mL)   POC Buprenorphine (BUP) None Detected NONE DETECTED (Cut Off Level 10 ng/mL)   POC Oxazepam (BZO) None Detected NONE DETECTED (Cut Off Level 300 ng/mL)   POC Cocaine UR None Detected NONE DETECTED (Cut Off Level 300 ng/mL)   POC Methamphetamine UR None Detected NONE DETECTED (Cut Off Level 1000 ng/mL)   POC Morphine None Detected NONE DETECTED (Cut Off Level 300 ng/mL)   POC Methadone UR None Detected NONE DETECTED (Cut Off Level 300 ng/mL)   POC Oxycodone UR None Detected NONE DETECTED (Cut Off Level 100 ng/mL)   POC Marijuana UR None Detected NONE DETECTED (Cut Off Level 50 ng/mL)  CBC with Differential/Platelet     Status: Abnormal  Collection Time: 03/15/23 10:05 AM  Result Value Ref Range   WBC 7.0 4.0 - 10.5 K/uL   RBC 4.93 3.87 - 5.11 MIL/uL   Hemoglobin 12.1 12.0 - 15.0 g/dL   HCT 78.2 95.6 - 21.3 %   MCV 81.5 80.0 - 100.0 fL   MCH 24.5 (L) 26.0 - 34.0 pg   MCHC 30.1 30.0 - 36.0 g/dL   RDW 08.6 57.8 - 46.9 %   Platelets 330 150 - 400 K/uL   nRBC 0.0 0.0 - 0.2 %   Neutrophils Relative % 75 %   Neutro Abs 5.2 1.7 - 7.7 K/uL   Lymphocytes Relative 17 %   Lymphs Abs 1.2 0.7 - 4.0 K/uL   Monocytes Relative 7 %   Monocytes Absolute 0.5 0.1 - 1.0 K/uL   Eosinophils Relative 0 %   Eosinophils Absolute 0.0 0.0 - 0.5 K/uL   Basophils Relative 1 %   Basophils Absolute 0.0 0.0 - 0.1 K/uL   Immature Granulocytes 0 %   Abs Immature Granulocytes 0.01 0.00 - 0.07 K/uL    Comment: Performed at [facility], [address], [place], [place] [address]  Comprehensive metabolic panel     Status: Abnormal   Collection Time: 03/15/23 10:05 AM  Result Value Ref Range   Sodium 138 135 - 145 mmol/L   Potassium 2.6 (LL) 3.5 - 5.1 mmol/L    Comment: CRITICAL RESULT CALLED TO, READ BACK BY AND VERIFIED WITH APRIL COLEMAN RN @1330  02.04.2025 E.AHMED   Chloride 99 98 - 111 mmol/L   CO2 27 22 - 32 mmol/L   Glucose, Bld 117 (H) 70 - 99 mg/dL    Comment: Glucose reference range applies only to samples taken after fasting for at least 8 hours.   BUN 7 6 - 20 mg/dL   Creatinine, Ser 8.41 0.44 - 1.00 mg/dL   Calcium 9.5 8.9 - 32.4 mg/dL   Total Protein 9.0 (H) 6.5 - 8.1 g/dL   Albumin 4.0 3.5 - 5.0 g/dL   AST 17 15 - 41 U/L   ALT 15 0 - 44 U/L   Alkaline Phosphatase 73 38 - 126 U/L   Total Bilirubin 0.3 0.0 - 1.2 mg/dL   GFR, Estimated >40 >10 mL/min    Comment: (NOTE) Calculated using the CKD-EPI Creatinine Equation (2021)    Anion gap 12 5 - 15    Comment: Performed at [facility], [address], [place], [place] [address]  Hemoglobin A1c     Status: Abnormal   Collection Time: 03/15/23 10:05 AM  Result Value Ref Range   Hgb A1c MFr Bld 6.1 (H) 4.8 - 5.6 %    Comment: (NOTE) Pre diabetes:          5.7%-6.4%  Diabetes:              >6.4%  Glycemic control for   <7.0% adults with diabetes    Mean Plasma Glucose 128.37 mg/dL    Comment: Performed at [facility], [address], [place], [place] [address]  Magnesium     Status: None   Collection Time: 03/15/23 10:05 AM  Result Value Ref Range   Magnesium 2.0 1.7 - 2.4 mg/dL    Comment: Performed at [facility], [address] [place], [place] [address]  Ethanol     Status: None   Collection Time: 03/15/23 10:05 AM  Result Value Ref Range   Alcohol, Ethyl (B) <10 <10  mg/dL    Comment: (NOTE) Lowest detectable limit for serum alcohol is 10 mg/dL.  For medical purposes only. Performed at [facility], [address],  [place], [place] [address]   TSH     Status: None   Collection Time: 03/15/23 10:05 AM  Result Value Ref Range   TSH 0.803 0.350 - 4.500 uIU/mL    Comment: Performed by a 3rd Generation assay with a functional sensitivity of <=0.01 uIU/mL. Performed at [facility], [address] [place], [place] [address]   Vitamin B12     Status: None   Collection Time: 03/15/23 10:05 AM  Result Value Ref Range   Vitamin B-12 535 180 - 914 pg/mL    Comment: (NOTE) This assay is not validated for testing neonatal or myeloproliferative syndrome specimens for Vitamin B12 levels. Performed at [facility], [address], [place], [place] [address]   Potassium     Status: Abnormal   Collection Time: 03/15/23  6:26 PM  Result Value Ref Range   Potassium 3.4 (L) 3.5 - 5.1 mmol/L    Comment: Performed at [facility], [address], [place], [place] [address]  Glucose, capillary     Status: Abnormal   Collection Time: 03/16/23  8:45 PM  Result Value Ref Range   Glucose-Capillary 118 (H) 70 - 99 mg/dL    Comment: Glucose reference range applies only to samples taken after fasting for at least 8 hours.  Glucose, capillary     Status: Abnormal   Collection Time: 03/17/23  4:23 PM  Result Value Ref Range   Glucose-Capillary 119 (H) 70 - 99 mg/dL    Comment: Glucose reference range applies only to samples taken after fasting for at least 8 hours.   Comment 1 Notify RN   Glucose, capillary     Status: Abnormal   Collection Time: 03/17/23  9:04 PM  Result Value Ref Range   Glucose-Capillary 206 (H) 70 - 99 mg/dL    Comment: Glucose reference range applies only to samples taken after fasting for at least 8 hours.  Glucose, capillary     Status: Abnormal   Collection Time: 03/18/23  8:22 PM  Result Value Ref Range   Glucose-Capillary 116 (H) 70 - 99 mg/dL    Comment: Glucose reference range applies only to samples taken after fasting for at least 8 hours.  Glucose, capillary     Status:  Abnormal   Collection Time: 03/19/23 11:27 AM  Result Value Ref Range   Glucose-Capillary 104 (H) 70 - 99 mg/dL    Comment: Glucose reference range applies only to samples taken after fasting for at least 8 hours.  Glucose, capillary     Status: Abnormal   Collection Time: 03/19/23  4:17 PM  Result Value Ref Range   Glucose-Capillary 118 (H) 70 - 99 mg/dL    Comment: Glucose reference range applies only to samples taken after fasting for at least 8 hours.  Glucose, capillary     Status: Abnormal   Collection Time: 03/19/23  7:52 PM  Result Value Ref Range   Glucose-Capillary 125 (H) 70 - 99 mg/dL    Comment: Glucose reference range applies only to samples taken after fasting for at least 8 hours.  Glucose, capillary     Status: None   Collection Time: 03/20/23 11:27 AM  Result Value Ref Range   Glucose-Capillary 73 70 - 99 mg/dL    Comment: Glucose reference range applies  only to samples taken after fasting for at least 8 hours.  Basic Metabolic Panel     Status: Abnormal   Collection Time: 03/28/23 10:41 AM  Result Value Ref Range   Glucose 100 (H) 70 - 99 mg/dL   BUN 12 6 - 24 mg/dL   Creatinine, Ser 2.84 0.57 - 1.00 mg/dL   eGFR 87 >13 KG/MWN/0.27   BUN/Creatinine Ratio 14 9 - 23   Sodium 139 134 - 144 mmol/L   Potassium 3.7 3.5 - 5.2 mmol/L   Chloride 101 96 - 106 mmol/L   CO2 25 20 - 29 mmol/L   Calcium 9.3 8.7 - 10.2 mg/dL  Basic Metabolic Panel     Status: None   Collection Time: 04/04/23  9:02 AM  Result Value Ref Range   Glucose 99 70 - 99 mg/dL   BUN 9 6 - 24 mg/dL   Creatinine, Ser 2.53 0.57 - 1.00 mg/dL   eGFR 89 >66 YQ/IHK/7.42   BUN/Creatinine Ratio 11 9 - 23   Sodium 139 134 - 144 mmol/L   Potassium 3.9 3.5 - 5.2 mmol/L   Chloride 100 96 - 106 mmol/L   CO2 22 20 - 29 mmol/L   Calcium 9.4 8.7 - 10.2 mg/dL  Basic metabolic panel     Status: Abnormal   Collection Time: 04/11/23  3:15 AM  Result Value Ref Range   Sodium 137 135 - 145 mmol/L   Potassium 2.9  (L) 3.5 - 5.1 mmol/L   Chloride 96 (L) 98 - 111 mmol/L   CO2 29 22 - 32 mmol/L   Glucose, Bld 131 (H) 70 - 99 mg/dL    Comment: Glucose reference range applies only to samples taken after fasting for at least 8 hours.   BUN 6 6 - 20 mg/dL   Creatinine, Ser 5.95 0.44 - 1.00 mg/dL   Calcium 9.3 8.9 - 63.8 mg/dL   GFR, Estimated >75 >64 mL/min    Comment: (NOTE) Calculated using the CKD-EPI Creatinine Equation (2021)    Anion gap 12 5 - 15    Comment: Performed at [facility], [address] [place], [place] [address]  CBC     Status: Abnormal   Collection Time: 04/11/23  3:15 AM  Result Value Ref Range   WBC 7.2 4.0 - 10.5 K/uL   RBC 4.82 3.87 - 5.11 MIL/uL   Hemoglobin 11.8 (L) 12.0 - 15.0 g/dL   HCT 18.8 41.6 - 60.6 %   MCV 80.1 80.0 - 100.0 fL   MCH 24.5 (L) 26.0 - 34.0 pg   MCHC 30.6 30.0 - 36.0 g/dL   RDW 30.1 60.1 - 09.3 %   Platelets 340 150 - 400 K/uL   nRBC 0.0 0.0 - 0.2 %    Comment: Performed at [facility], [address], [place], [place] [address]  Troponin I (High Sensitivity)     Status: None   Collection Time: 04/11/23  3:15 AM  Result Value Ref Range   Troponin I (High Sensitivity) 6 <18 ng/L    Comment: (NOTE) Elevated high sensitivity troponin I (hsTnI) values and significant  changes across serial measurements may suggest ACS but many other  chronic and acute conditions are known to elevate hsTnI results.  Refer to the "Links" section for chest pain algorithms and additional  guidance. Performed at [facility], [address], [place], [place] [address]   hCG, serum, qualitative     Status: None  Collection Time: 04/11/23  3:15 AM  Result Value Ref Range   Preg, Serum NEGATIVE NEGATIVE    Comment:        THE SENSITIVITY OF THIS METHODOLOGY IS >10 mIU/mL. Performed at [facility], [address], [place], [place] [address]   Troponin I (High Sensitivity)     Status: None   Collection Time: 04/11/23  4:51 AM  Result  Value Ref Range   Troponin I (High Sensitivity) 5 <18 ng/L    Comment: (NOTE) Elevated high sensitivity troponin I (hsTnI) values and significant  changes across serial measurements may suggest ACS but many other  chronic and acute conditions are known to elevate hsTnI results.  Refer to the "Links" section for chest pain algorithms and additional  guidance. Performed at [facility], [address], [place], [place] [address]   Basic Metabolic Panel     Status: Abnormal   Collection Time: 04/14/23  9:40 AM  Result Value Ref Range   Glucose 134 (H) 70 - 99 mg/dL   BUN 17 6 - 24 mg/dL   Creatinine, Ser 4.40 0.57 - 1.00 mg/dL   eGFR 75 >10 UV/OZD/6.64   BUN/Creatinine Ratio 18 9 - 23   Sodium 138 134 - 144 mmol/L   Potassium 3.8 3.5 - 5.2 mmol/L   Chloride 99 96 - 106 mmol/L   CO2 23 20 - 29 mmol/L   Calcium 9.4 8.7 - 10.2 mg/dL  Comprehensive metabolic panel     Status: Abnormal   Collection Time: 04/15/23  6:38 PM  Result Value Ref Range   Sodium 139 135 - 145 mmol/L   Potassium 3.2 (L) 3.5 - 5.1 mmol/L   Chloride 102 98 - 111 mmol/L   CO2 25 22 - 32 mmol/L   Glucose, Bld 109 (H) 70 - 99 mg/dL    Comment: Glucose reference range applies only to samples taken after fasting for at least 8 hours.   BUN 14 6 - 20 mg/dL   Creatinine, Ser 4.03 0.44 - 1.00 mg/dL   Calcium 9.2 8.9 - 47.4 mg/dL   Total Protein 7.5 6.5 - 8.1 g/dL   Albumin 3.7 3.5 - 5.0 g/dL   AST 19 15 - 41 U/L   ALT 21 0 - 44 U/L   Alkaline Phosphatase 63 38 - 126 U/L   Total Bilirubin 0.6 0.0 - 1.2 mg/dL   GFR, Estimated >25 >95 mL/min    Comment: (NOTE) Calculated using the CKD-EPI Creatinine Equation (2021)    Anion gap 12 5 - 15    Comment: Performed at [facility], [address], [place], [place] [address]  CBC with Differential     Status: Abnormal   Collection Time: 04/15/23  6:38 PM  Result Value Ref Range   WBC 9.7 4.0 - 10.5 K/uL   RBC 4.49 3.87 - 5.11 MIL/uL   Hemoglobin 11.1 (L)  12.0 - 15.0 g/dL   HCT 64.3 32.9 - 51.8 %   MCV 80.6 80.0 - 100.0 fL   MCH 24.7 (L) 26.0 - 34.0 pg   MCHC 30.7 30.0 - 36.0 g/dL   RDW 84.1 66.0 - 63.0 %   Platelets 345 150 - 400 K/uL   nRBC 0.0 0.0 - 0.2 %   Neutrophils Relative % 75 %   Neutro Abs 7.4 1.7 - 7.7 K/uL   Lymphocytes Relative 16 %   Lymphs Abs 1.5 0.7 - 4.0 K/uL   Monocytes Relative 8 %  Monocytes Absolute 0.7 0.1 - 1.0 K/uL   Eosinophils Relative 0 %   Eosinophils Absolute 0.0 0.0 - 0.5 K/uL   Basophils Relative 1 %   Basophils Absolute 0.1 0.0 - 0.1 K/uL   Immature Granulocytes 0 %   Abs Immature Granulocytes 0.02 0.00 - 0.07 K/uL    Comment: Performed at [facility], [address], [place], [place] [address]  hCG, serum, qualitative     Status: None   Collection Time: 04/15/23  6:38 PM  Result Value Ref Range   Preg, Serum NEGATIVE NEGATIVE    Comment:        THE SENSITIVITY OF THIS METHODOLOGY IS >10 mIU/mL. Performed at [facility], [address], [place], [place] [address]      Assessment/Plan:  NO SHOWED  No orders of the defined types were placed in this encounter.  No orders of the defined types were placed in this encounter.   Cc: Glynn Octave, MD,  Everhart, Kirstie, DO  Naomie Dean, MD  [facility] [address] [place], [place] [address]  Phone [phone] Fax [phone]

## 2023-04-25 ENCOUNTER — Ambulatory Visit (HOSPITAL_COMMUNITY)
Admission: EM | Admit: 2023-04-25 | Discharge: 2023-04-25 | Disposition: A | Discharge status: Home / Self Care | Payer: MEDICAID | Attending: Family Medicine | Admitting: Family Medicine

## 2023-04-25 ENCOUNTER — Encounter (HOSPITAL_COMMUNITY): Payer: Self-pay | Admitting: Emergency Medicine

## 2023-04-25 ENCOUNTER — Other Ambulatory Visit: Payer: Self-pay

## 2023-04-25 DIAGNOSIS — R0789 Other chest pain: Secondary | ICD-10-CM

## 2023-04-25 DIAGNOSIS — R519 Headache, unspecified: Secondary | ICD-10-CM

## 2023-04-25 MED ORDER — TIZANIDINE HCL 4 MG PO TABS: 4.0000 mg | ORAL_TABLET | Freq: Three times a day (TID) | ORAL | 0 refills | Status: DC | PRN

## 2023-04-25 MED ORDER — OMEPRAZOLE 40 MG PO CPDR: 40.0000 mg | DELAYED_RELEASE_CAPSULE | Freq: Every day | ORAL | 1 refills | Status: AC

## 2023-04-25 NOTE — ED Provider Notes (Signed)
 [facility]    CSN: [id number] Arrival date & time: 04/25/23  0845      History   Chief Complaint Chief Complaint  Patient presents with   Chest Pain    HPI Kendra Harris is a 43 y.o. female.    Chest Pain Here for chest pain and left posterior shoulder pain.  These symptoms began last evening. Chest pain is sharp and comes and goes, lasting up to 1 minute. She is also having pain and tightness in her posterior left neck and shoulder.  No trauma or fall. No cough/fever/chills. No n/v. Has been burping some, and sometimes the cp is a burning.  Has had a recent death in the family 2 days ago. No SI. Is out of her abilify and another medicine for mood. Will get refills today.  Takes pepcid ac.  No diaphoresis or palpitations.  NKDA  Past Medical History:  Diagnosis Date   DELAYED MENSES 02/11/2010   Qualifier: Diagnosis of  By: Wallene Huh  MD, Khary     Hypertension    MIGRAINE HEADACHE 12/13/2007   Urinary tract infection 03/04/2011    Patient Active Problem List   Diagnosis Date Noted   Chronic pain syndrome 03/20/2023   Severe recurrent depression with psychosis (HCC) 03/16/2023   MDD (major depressive disorder), severe (HCC) 03/15/2023   Systolic murmur 01/14/2023   Desire for pregnancy 01/14/2023   Epidermoid cyst of skin of chest 04/29/2022   Trapezius muscle strain 02/07/2022   GERD (gastroesophageal reflux disease) 05/07/2021   Tension headache 05/07/2021   Hypokalemia 03/04/2021   Anemia 04/11/2019   Menorrhagia 04/27/2013   Anxiety and depression 07/24/2010   Back pain 02/11/2010   Essential hypertension, benign 08/31/2007   Morbid obesity with BMI of 45.0-49.9, adult (HCC) 04/07/2006    Past Surgical History:  Procedure Laterality Date   NO PAST SURGERIES      OB History     Gravida  5   Para  5   Term  5   Preterm  0   AB  0   Living  5      SAB  0   IAB  0   Ectopic  0   Multiple  0   Live Births  5             Home Medications    Prior to Admission medications   Medication Sig Start Date End Date Taking? Authorizing Provider  omeprazole (PRILOSEC) 40 MG capsule Take 1 capsule (40 mg total) by mouth daily. 04/25/23  Yes Zenia Resides, MD  amLODipine (NORVASC) 10 MG tablet Take 1 tablet (10 mg total) by mouth daily. Patient taking differently: Take 10 mg by mouth daily. PATIENT IS TAKING THIS MEDICINE 03/21/23 04/20/23  Myriam Forehand, NP  ARIPiprazole (ABILIFY) 5 MG tablet Take 1 tablet (5 mg total) by mouth daily. 03/20/23 04/19/23  Myriam Forehand, NP  butalbital-acetaminophen-caffeine (FIORICET) [phone] MG tablet Take 1 tablet by mouth every 6 (six) hours as needed for headache. 04/15/23   Tanda Rockers A, DO  DULoxetine 40 MG CPEP Take 1 capsule (40 mg total) by mouth daily. 04/14/23 05/14/23  Glendale Chard, DO  hydrochlorothiazide (HYDRODIURIL) 25 MG tablet Take 1 tablet (25 mg total) by mouth daily. 04/14/23   Glendale Chard, DO  losartan (COZAAR) 25 MG tablet Take 1 tablet (25 mg total) by mouth at bedtime. 03/28/23   Everhart, Kirstie, DO  prochlorperazine (COMPAZINE) 10 MG tablet Take 1 tablet (  10 mg total) by mouth every 8 (eight) hours as needed (for headache). 04/13/23   Alicia Amel, MD  SUMAtriptan (IMITREX) 50 MG tablet Take 1 tablet (50 mg total) by mouth every 2 (two) hours as needed for migraine. May repeat in 2 hours if headache persists or recurs. 04/13/23   Alicia Amel, MD  tiZANidine (ZANAFLEX) 4 MG tablet Take 1 tablet (4 mg total) by mouth every 8 (eight) hours as needed for muscle spasms. 04/25/23   Zenia Resides, MD  traZODone (DESYREL) 50 MG tablet Take 1 tablet (50 mg total) by mouth at bedtime as needed for sleep. 03/20/23 04/19/23  Myriam Forehand, NP    Family History Family History  Problem Relation Age of Onset   Healthy Mother    Sickle cell trait Sister     Social History Social History   Tobacco Use   Smoking status: Never    Passive exposure: Never    Smokeless tobacco: Never  Vaping Use   Vaping status: Never Used  Substance Use Topics   Alcohol use: No   Drug use: No     Allergies   Patient has no known allergies.   Review of Systems Review of Systems  Cardiovascular:  Positive for chest pain.     Physical Exam Triage Vital Signs ED Triage Vitals  Encounter Vitals Group     BP 04/25/23 0855 130/86     Systolic BP Percentile --      Diastolic BP Percentile --      Pulse Rate 04/25/23 0855 74     Resp 04/25/23 0855 20     Temp 04/25/23 0855 98.1 F (36.7 C)     Temp Source 04/25/23 0855 Oral     SpO2 04/25/23 0855 99 %     Weight --      Height --      Head Circumference --      Peak Flow --      Pain Score 04/25/23 0850 4     Pain Loc --      Pain Education --      Exclude from Growth Chart --    No data found.  Updated Vital Signs BP 130/86 (BP Location: Right Arm) Comment: has not had blood pressure medicine today Comment (BP Location): large cuff  Pulse 74   Temp 98.1 F (36.7 C) (Oral)   Resp 20   LMP 04/13/2023 (Exact Date)   SpO2 99%   Visual Acuity Right Eye Distance:   Left Eye Distance:   Bilateral Distance:    Right Eye Near:   Left Eye Near:    Bilateral Near:     Physical Exam Vitals reviewed.  Constitutional:      General: She is not in acute distress.    Appearance: She is not ill-appearing, toxic-appearing or diaphoretic.  HENT:     Nose: Nose normal.     Mouth/Throat:     Mouth: Mucous membranes are moist.  Eyes:     Extraocular Movements: Extraocular movements intact.     Conjunctiva/sclera: Conjunctivae normal.     Pupils: Pupils are equal, round, and reactive to light.  Neck:     Comments: There is tenderness and spasm of the trapezius at her neck and upper left shoulder. Abduction of the left arm causes increase in pain Cardiovascular:     Rate and Rhythm: Normal rate and regular rhythm.     Heart sounds: No murmur heard. Pulmonary:  Effort: Pulmonary effort is  normal. No respiratory distress.     Breath sounds: Normal breath sounds. No stridor. No wheezing, rhonchi or rales.  Musculoskeletal:     Cervical back: Neck supple.  Lymphadenopathy:     Cervical: No cervical adenopathy.  Skin:    Capillary Refill: Capillary refill takes less than 2 seconds.     Coloration: Skin is not jaundiced or pale.  Neurological:     General: No focal deficit present.     Mental Status: She is alert and oriented to person, place, and time.  Psychiatric:        Behavior: Behavior normal.      UC Treatments / Results  Labs (all labs ordered are listed, but only abnormal results are displayed) Labs Reviewed - No data to display  EKG   Radiology No results found.  Procedures Procedures (including critical care time)  Medications Ordered in UC Medications - No data to display  Initial Impression / Assessment and Plan / UC Course  I have reviewed the triage vital signs and the nursing notes.  Pertinent labs & imaging results that were available during my care of the patient were reviewed by me and considered in my medical decision making (see chart for details).     EKG is normal, without any worrisome ST changes. I do think emotional upset is playing a part, plus possible acid reflux with the burping and burning pain. She has been out of meds that help her rest. Low likelihood of cardiac origin.   Prilosec sent in for reflux, and tizanidine sent in for the muscle spasm of her trapezius.3   To the ER if worse. Final Clinical Impressions(s) / UC Diagnoses   Final diagnoses:  Atypical chest pain     Discharge Instructions      Take omeprazole 40 mg--1 capsule daily for stomach acid.   Take tizanidine 4 mg--1 every 8 hours as needed for muscle spasms; this medication can cause dizziness and sleepiness   I am glad you are going to restart your other medicines you were out of    ED Prescriptions     Medication Sig Dispense Auth.  Provider   tiZANidine (ZANAFLEX) 4 MG tablet Take 1 tablet (4 mg total) by mouth every 8 (eight) hours as needed for muscle spasms. 21 tablet Zenia Resides, MD   omeprazole (PRILOSEC) 40 MG capsule Take 1 capsule (40 mg total) by mouth daily. 30 capsule Zenia Resides, MD      PDMP not reviewed this encounter.   Zenia Resides, MD 04/25/23 [phone]

## 2023-04-25 NOTE — ED Triage Notes (Signed)
 Intermittent pain in chest that started last night.  No pain currently.  Patient does not relate pain to anything.  Pain is sharp when occurs.  Denies sob.  Muscle spasms in left shoulder- muscle spasms for a year.  Complains of shoulders and neck feeling tight  Patient is tearful and speaks about a family member that died 2 days ago and is very emotional  OUT OF TRAZODONE, ABILIFY AND COMPAZINE FOR DAYS

## 2023-04-25 NOTE — Discharge Instructions (Signed)
 Take omeprazole 40 mg--1 capsule daily for stomach acid.   Take tizanidine 4 mg--1 every 8 hours as needed for muscle spasms; this medication can cause dizziness and sleepiness   I am glad you are going to restart your other medicines you were out of

## 2023-04-26 ENCOUNTER — Ambulatory Visit: Payer: MEDICAID | Admitting: Student

## 2023-04-26 NOTE — Progress Notes (Deleted)
    SUBJECTIVE:   CHIEF COMPLAINT / HPI: shoulder/neck pain  ***  PERTINENT  PMH / PSH: ***  OBJECTIVE:   LMP 04/13/2023 (Exact Date)   ***  ASSESSMENT/PLAN:   No problem-specific Assessment & Plan notes found for this encounter.     Levin Erp, MD [facility]

## 2023-04-27 ENCOUNTER — Ambulatory Visit (HOSPITAL_COMMUNITY): Admission: RE | Admit: 2023-04-27 | Payer: MEDICAID | Source: Ambulatory Visit

## 2023-04-28 ENCOUNTER — Emergency Department (HOSPITAL_COMMUNITY): Payer: MEDICAID

## 2023-04-28 ENCOUNTER — Emergency Department (HOSPITAL_COMMUNITY)
Admission: EM | Admit: 2023-04-28 | Discharge: 2023-04-28 | Disposition: A | Discharge status: Home / Self Care | Payer: MEDICAID | Attending: Emergency Medicine | Admitting: Emergency Medicine

## 2023-04-28 ENCOUNTER — Telehealth: Payer: Self-pay

## 2023-04-28 ENCOUNTER — Other Ambulatory Visit: Payer: Self-pay

## 2023-04-28 ENCOUNTER — Encounter (HOSPITAL_COMMUNITY): Payer: Self-pay

## 2023-04-28 DIAGNOSIS — R079 Chest pain, unspecified: Secondary | ICD-10-CM | POA: Diagnosis present

## 2023-04-28 DIAGNOSIS — Z79899 Other long term (current) drug therapy: Secondary | ICD-10-CM | POA: Diagnosis not present

## 2023-04-28 DIAGNOSIS — E876 Hypokalemia: Secondary | ICD-10-CM | POA: Diagnosis not present

## 2023-04-28 DIAGNOSIS — I10 Essential (primary) hypertension: Secondary | ICD-10-CM | POA: Insufficient documentation

## 2023-04-28 LAB — HCG, SERUM, QUALITATIVE: Preg, Serum: NEGATIVE

## 2023-04-28 LAB — CBC WITH DIFFERENTIAL/PLATELET
Abs Immature Granulocytes: 0.04 10*3/uL (ref 0.00–0.07)
Basophils Absolute: 0 10*3/uL (ref 0.0–0.1)
Basophils Relative: 0 %
Eosinophils Absolute: 0 10*3/uL (ref 0.0–0.5)
Eosinophils Relative: 0 %
HCT: 39.1 % (ref 36.0–46.0)
Hemoglobin: 12.1 g/dL (ref 12.0–15.0)
Immature Granulocytes: 1 %
Lymphocytes Relative: 14 %
Lymphs Abs: 1.2 10*3/uL (ref 0.7–4.0)
MCH: 25.4 pg — ABNORMAL LOW (ref 26.0–34.0)
MCHC: 30.9 g/dL (ref 30.0–36.0)
MCV: 82 fL (ref 80.0–100.0)
Monocytes Absolute: 0.7 10*3/uL (ref 0.1–1.0)
Monocytes Relative: 8 %
Neutro Abs: 6.4 10*3/uL (ref 1.7–7.7)
Neutrophils Relative %: 77 %
Platelets: 361 10*3/uL (ref 150–400)
RBC: 4.77 MIL/uL (ref 3.87–5.11)
RDW: 14.8 % (ref 11.5–15.5)
WBC: 8.3 10*3/uL (ref 4.0–10.5)
nRBC: 0 % (ref 0.0–0.2)

## 2023-04-28 LAB — COMPREHENSIVE METABOLIC PANEL
ALT: 14 U/L (ref 0–44)
AST: 15 U/L (ref 15–41)
Albumin: 3.6 g/dL (ref 3.5–5.0)
Alkaline Phosphatase: 70 U/L (ref 38–126)
Anion gap: 11 (ref 5–15)
BUN: 9 mg/dL (ref 6–20)
CO2: 27 mmol/L (ref 22–32)
Calcium: 8.7 mg/dL — ABNORMAL LOW (ref 8.9–10.3)
Chloride: 99 mmol/L (ref 98–111)
Creatinine, Ser: 0.87 mg/dL (ref 0.44–1.00)
GFR, Estimated: >60 mL/min (ref >60)
Glucose, Bld: 122 mg/dL — ABNORMAL HIGH (ref 70–99)
Potassium: 2.7 mmol/L — CL (ref 3.5–5.1)
Sodium: 137 mmol/L (ref 135–145)
Total Bilirubin: 0.6 mg/dL (ref 0.0–1.2)
Total Protein: 7.7 g/dL (ref 6.5–8.1)

## 2023-04-28 LAB — TROPONIN I (HIGH SENSITIVITY)
Troponin I (High Sensitivity): 4 ng/L (ref <18)
Troponin I (High Sensitivity): 4 ng/L (ref <18)

## 2023-04-28 LAB — MAGNESIUM: Magnesium: 2.1 mg/dL (ref 1.7–2.4)

## 2023-04-28 LAB — TSH: TSH: 1.164 u[IU]/mL (ref 0.350–4.500)

## 2023-04-28 MED ORDER — LIDOCAINE 5 % EX PTCH
2.0000 | MEDICATED_PATCH | CUTANEOUS | Status: DC
  Filled 2023-04-28: qty 2

## 2023-04-28 MED ORDER — NAPROXEN 500 MG PO TABS: 500.0000 mg | ORAL_TABLET | Freq: Two times a day (BID) | ORAL | 0 refills | Status: DC | PRN

## 2023-04-28 MED ORDER — ACETAMINOPHEN 325 MG PO TABS
650.0000 mg | ORAL_TABLET | Freq: Once | ORAL | Status: AC
  Administered 2023-04-28: 650 mg via ORAL
  Filled 2023-04-28: qty 2

## 2023-04-28 MED ORDER — POTASSIUM CHLORIDE CRYS ER 20 MEQ PO TBCR: EXTENDED_RELEASE_TABLET | ORAL | 0 refills | Status: DC

## 2023-04-28 MED ORDER — POTASSIUM CHLORIDE CRYS ER 20 MEQ PO TBCR
40.0000 meq | EXTENDED_RELEASE_TABLET | Freq: Once | ORAL | Status: AC
  Administered 2023-04-28: 40 meq via ORAL
  Filled 2023-04-28: qty 2

## 2023-04-28 NOTE — Telephone Encounter (Signed)
 Patient presents to clinic requesting same day appointment from Urgent Care follow up on 04/25/23.  She reports that she has been having left sided trapezius pain that radiates into left upper chest. She feels that pain is worse with movement.   EKG on 04/25/23 was normal. Patient denies shortness of breath or difficulty breathing.   She also reports experiencing numbness and cramps in both feet and legs.   She was prescribed tizanidine at Urgent Care visit, that has not really helped.   Patient became tearful when I advised that we do not have any available appointments for today. Patient states that she is worried that symptoms indicate "something serious." Advised patient that she could be evaluated in the ED for concerns. She states that she does not want to go to the ED due to long wait times.   Patient has follow up scheduled for tomorrow. ED precautions discussed with patient.   Veronda Prude, RN

## 2023-04-28 NOTE — ED Provider Triage Note (Signed)
 Emergency Medicine Provider Triage Evaluation Note  Kendra Harris , a 43 y.o. female  was evaluated in triage.  Pt complains of several days of chest pain.  Review of Systems  Positive: Left-sided chest pain going to left shoulder.  Palpitations, nausea, tingling in legs and numbness in the legs that was transient and resolved.  Again stress and anxiety after recent family number loss Negative: Shortness of breath, congestion, cough, constipation, diarrhea, urinary changes.  Denies leg pain or leg swelling.  No family or personal history of blood clots or cardiac disease.  Physical Exam  BP (!) 132/92 (BP Location: Right Arm)   Pulse 86   Temp 98.5 F (36.9 C)   Resp 15   Ht 5\' 2"  (1.575 m)   Wt 95.7 kg   LMP 04/13/2023 (Exact Date)   SpO2 97%   BMI 38.59 kg/m  Gen:   Awake, tearful and very anxious on exam Resp:  Normal effort , lungs clear bilaterally.  She does have a systolic murmur MSK:   Moves extremities without difficulty , no numbness or weakness and the tingling and numbness she reported yesterday has resolved Other:  Patient very tearful and anxious.  She does have a murmur.  Chest is nontender to produce any discomfort.  She did have some palpable muscle spasm in her left trapezius area and left upper chest wall.  Back nontender.  Legs nontender nonedematous with intact sensation and strength in extremities.  Good pulses throughout.  Medical Decision Making  Medically screening exam initiated at 9:24 AM.  Appropriate orders placed.  Almond Lint was informed that the remainder of the evaluation will be completed by another provider, this initial triage assessment does not replace that evaluation, and the importance of remaining in the ED until their evaluation is complete.  Kendra Harris is a 43 y.o. female with a past medical history significant for hypertension, anxiety, depression, GERD, known systolic murmur, and a documented chronic pain syndrome who  presents with persistent chest pain.  According to patient, for the last 3 days she has had pain in her left chest that goes towards her left shoulder.  She reports he is very stressed and anxious and thinks that could be contributing to things.  She reports some palpitations and feels that her heart rate has been going fast on and off but it is not persistently so.  She reports no trauma to her chest.  She denies any shortness of breath.  She says that she saw her PCP who thought it was more of a musculoskeletal spasm and pain.  Patient is worried she is having heart attack and reports she just lost somebody in her family.  She said that yesterday she felt her legs were slightly numb and some tingling in her toes but that has resolved.  She is very anxious.  She reports the pain is up to a 4 out of 10 in severity.  On exam, she does have a murmur.  Lungs otherwise clear.  Chest is nontender.  She did have some mild tenderness near her trapezius and shoulder area where there was some clear spasm.  Legs nontender nonedematous.  Abdomen nontender.  Good bowel sounds.  Patient otherwise answering questions appropriately.  No focal neurologic deficits.  Clinically I suspect this is more musculoskeletal versus stress and anxiety manifesting as chest discomfort.  Will however get troponin, chest x-ray, and labs.  Given her reported palpitations and fast heart rate sensation we will get  a magnesium level and TSH as well.  She is PERC negative, have low suspicion for pulmonary embolism.  Will defer further workup to team when they see her in exam room but will start her workup now.         Alaira Level, Canary Brim, MD 04/28/23 [phone]

## 2023-04-28 NOTE — Discharge Instructions (Addendum)
 You were seen in the emergency department for chest pain.  Your labs showed that your potassium was low, we provided a potassium supplementation for you to take.  Please take 2 tablets (40 mEq) tonight, then take 1 tablet (20 mEq) daily for the next 1 week.  It is very important that you follow-up with your primary care provider to have your potassium level rechecked.  Your chest xray was normal Your hemoglobin and thyroid test were normal.   We are sending you home with naproxen to help with pain.   - Naproxen- this is a nonsteroidal anti-inflammatory medication that will help with pain and swelling. Be sure to take this medication as prescribed with food, 1 pill every 12 hours,  It should be taken with food, as it can cause stomach upset, and more seriously, stomach bleeding. Do not take other nonsteroidal anti-inflammatory medications with this such as Advil, Motrin, Aleve, Mobic, Goodie Powder, or Motrin etc..    You make take Tylenol per over the counter dosing with this medication.   We have prescribed you new medication(s) today. Discuss the medications prescribed today with your pharmacist as they can have adverse effects and interactions with your other medicines including over the counter and prescribed medications. Seek medical evaluation if you start to experience new or abnormal symptoms after taking one of these medicines, seek care immediately if you start to experience difficulty breathing, feeling of your throat closing, facial swelling, or rash as these could be indications of a more serious allergic reaction  Please follow-up with your primary care provider within 1 week.  Return to the emergency department for new or worsening symptoms including but not limited to new or worsening pain, trouble breathing, passing out, dizziness, or any other concerns.

## 2023-04-28 NOTE — ED Triage Notes (Signed)
 Pt came in via POV d/t generalized CP for the past few days, endorses it worsened last night & had been told by her PCP it was a muscle spasm. A/Ox4, rates her pain 3/10. Also endorses yesterday her feet felt numb & her legs were "tight."

## 2023-04-28 NOTE — ED Provider Notes (Signed)
 [place] EMERGENCY DEPARTMENT AT [facility] Provider Note   CSN: [id number] Arrival date & time: 04/28/23  5284     History  Chief Complaint  Patient presents with   Chest Pain    Kendra Harris is a 43 y.o. female with a hx of hypertension, anxiety, depression, GERD, migraines, tension headaches and chronic pain syndrome who presents to the ED with complaints of chest pain for the past 3-4 days. Patient reports pain to the bilateral chest/upper back region, L>R, occurs intermittently lasting several minutes at a time then is more of a soreness. Pain is currently a 3/10 in severity. Aggravated when she lifted her LUE above her head this AM. No specific alleviating factors. Having associated palpitations as if heart is beating fast. Notes that yesterday her toes felt somewhat numb but this resolved and has not re-occurred. She reports she saw her PCP for this problem who prescribed tizanidine, she just picked this up this morning and has not tried taking it yet.  She reports some increased anxiety/stress, recently lost her aunt and she is very concerned about her mother's stress related to this. Denies emesis, syncope, dizziness, leg pain/swelling, hemoptysis, recent surgery/trauma, recent long travel, hormone use, personal hx of cancer, or hx of DVT/PE.      HPI     Home Medications Prior to Admission medications   Medication Sig Start Date End Date Taking? Authorizing Provider  amLODipine (NORVASC) 10 MG tablet Take 1 tablet (10 mg total) by mouth daily. Patient taking differently: Take 10 mg by mouth daily. PATIENT IS TAKING THIS MEDICINE 03/21/23 04/20/23  Myriam Forehand, NP  ARIPiprazole (ABILIFY) 5 MG tablet Take 1 tablet (5 mg total) by mouth daily. 03/20/23 04/19/23  Myriam Forehand, NP  butalbital-acetaminophen-caffeine (FIORICET) [phone] MG tablet Take 1 tablet by mouth every 6 (six) hours as needed for headache. 04/15/23   Tanda Rockers A, DO  DULoxetine 40 MG CPEP  Take 1 capsule (40 mg total) by mouth daily. 04/14/23 05/14/23  Glendale Chard, DO  hydrochlorothiazide (HYDRODIURIL) 25 MG tablet Take 1 tablet (25 mg total) by mouth daily. 04/14/23   Glendale Chard, DO  losartan (COZAAR) 25 MG tablet Take 1 tablet (25 mg total) by mouth at bedtime. 03/28/23   Everhart, Kirstie, DO  omeprazole (PRILOSEC) 40 MG capsule Take 1 capsule (40 mg total) by mouth daily. 04/25/23   Zenia Resides, MD  prochlorperazine (COMPAZINE) 10 MG tablet Take 1 tablet (10 mg total) by mouth every 8 (eight) hours as needed (for headache). 04/13/23   Alicia Amel, MD  SUMAtriptan (IMITREX) 50 MG tablet Take 1 tablet (50 mg total) by mouth every 2 (two) hours as needed for migraine. May repeat in 2 hours if headache persists or recurs. 04/13/23   Alicia Amel, MD  tiZANidine (ZANAFLEX) 4 MG tablet Take 1 tablet (4 mg total) by mouth every 8 (eight) hours as needed for muscle spasms. 04/25/23   Zenia Resides, MD  traZODone (DESYREL) 50 MG tablet Take 1 tablet (50 mg total) by mouth at bedtime as needed for sleep. 03/20/23 04/19/23  Myriam Forehand, NP      Allergies    Patient has no known allergies.    Review of Systems   Review of Systems  Constitutional:  Negative for chills and fever.  Respiratory:  Negative for shortness of breath.   Cardiovascular:  Positive for chest pain and palpitations. Negative for leg swelling.  Gastrointestinal:  Negative for  abdominal distention and vomiting.  Neurological:  Positive for numbness (to toes, resolved). Negative for dizziness and syncope.  Psychiatric/Behavioral:  The patient is nervous/anxious.   All other systems reviewed and are negative.   Physical Exam Updated Vital Signs BP (!) 132/92 (BP Location: Right Arm)   Pulse 86   Temp 98.5 F (36.9 C)   Resp 15   Ht 5\' 2"  (1.575 m)   Wt 95.7 kg   LMP 04/13/2023 (Exact Date)   SpO2 97%   BMI 38.59 kg/m  Physical Exam Vitals and nursing note reviewed.  Constitutional:       General: She is not in acute distress.    Appearance: She is well-developed. She is not toxic-appearing.  HENT:     Head: Normocephalic and atraumatic.  Eyes:     General:        Right eye: No discharge.        Left eye: No discharge.     Conjunctiva/sclera: Conjunctivae normal.  Cardiovascular:     Rate and Rhythm: Normal rate and regular rhythm.     Pulses:          Radial pulses are 2+ on the right side and 2+ on the left side.     Heart sounds: Murmur heard.  Pulmonary:     Effort: No respiratory distress.     Breath sounds: Normal breath sounds. No wheezing or rales.  Abdominal:     General: There is no distension.     Palpations: Abdomen is soft.     Tenderness: There is no abdominal tenderness. There is no guarding or rebound.  Musculoskeletal:     Cervical back: Neck supple.     Comments: Back: No point/focal vertebral tenderness.  Palpation to bilateral trapezius muscles. Lower extremities: No peripheral edema or calf tenderness.  Skin:    General: Skin is warm and dry.  Neurological:     Mental Status: She is alert.     Comments: Clear speech.  Strength and sensation grossly intact x 4.  Psychiatric:        Thought Content: Thought content does not include suicidal ideation.     Comments: Briefly tearful, resolved.       ED Results / Procedures / Treatments   Labs (all labs ordered are listed, but only abnormal results are displayed) Labs Reviewed  CBC WITH DIFFERENTIAL/PLATELET - Abnormal; Notable for the following components:      Result Value   MCH 25.4 (*)    All other components within normal limits  COMPREHENSIVE METABOLIC PANEL  TSH  MAGNESIUM  HCG, SERUM, QUALITATIVE  TROPONIN I (HIGH SENSITIVITY)    EKG EKG Interpretation Date/Time:  Thursday April 28 2023 09:12:42 EDT Ventricular Rate:  83 PR Interval:  176 QRS Duration:  94 QT Interval:  398 QTC Calculation: 467 R Axis:   61  Text Interpretation: Normal sinus rhythm T wave  abnormality, consider inferior ischemia Confirmed by Pricilla Loveless [phone]) on 04/28/2023 11:47:07 AM  Radiology No results found.  Procedures Procedures    Medications Ordered in ED Medications - No data to display  ED Course/ Medical Decision Making/ A&P                                 Medical Decision Making Risk OTC drugs. Prescription drug management.   Patient presents to the emergency department with chest pain. Patient nontoxic appearing, in no apparent distress, vitals  without significant abnormality.. Fairly benign physical exam, some reproducibility of pain with palpation of bilateral trapezius muscles.   DDX including but not limited to: ACS, pulmonary embolism, dissection, pneumothorax, pneumonia, arrhythmia, severe anemia, MSK, GERD, anxiety, abdominal process.   Additional history obtained:  Chart & nursing note reviewed.   EKG: Normal sinus rhythm T wave abnormality  Lab Tests:  I reviewed & interpreted labs including:  CBC: Unremarkable CMP: Hypokalemia with a potassium of 2.7, mild hypocalcemia at 8.7. Mg: Within normal limits TSH: WNL Pregnancy test: Negative Troponin: 4,  Imaging Studies ordered:  I viewed the following imaging, agree with radiologist impression:  CXR: No active cardiopulmonary disease.   ED Course:  I ordered medications including Tylenol and Lidoderm patches for pain and potassium tablets for hypokalemia.   12:30:: RE-EVAL: Patient feeling better asking to be discharged, advised to wait for 2nd troponin.   Heart Pathway Score low risk- EKG without STEMI, delta troponin negative, low suspicion for ACS. Patient is low risk wells, PERC negative, doubt pulmonary embolism. Pain is not a tearing sensation, symmetric pulses, no widening of mediastinum on CXR, doubt dissection. Abdominal exam benign.   Based on patient's chief complaint, I considered admission might be necessary, however after reassuring ED workup feel patient is  reasonable for discharge.   I discussed results, treatment plan, need for PCP follow-up, and return precautions with the patient. Provided opportunity for questions, patient confirmed understanding and is in agreement with plan.    Portions of this note were generated with Scientist, clinical (histocompatibility and immunogenetics). Dictation errors may occur despite best attempts at proofreading.   Final Clinical Impression(s) / ED Diagnoses Final diagnoses:  Chest pain, unspecified type  Hypokalemia    Rx / DC Orders ED Discharge Orders          Ordered    potassium chloride SA (KLOR-CON M) 20 MEQ tablet        04/28/23 1151    naproxen (NAPROSYN) 500 MG tablet  2 times daily PRN        04/28/23 [address] [place] R, PA-C 04/28/23 1335    Pricilla Loveless, MD 04/28/23 1513

## 2023-04-29 ENCOUNTER — Ambulatory Visit (INDEPENDENT_AMBULATORY_CARE_PROVIDER_SITE_OTHER): Payer: MEDICAID | Admitting: Student

## 2023-04-29 VITALS — BP 117/77 | HR 73 | Ht 62.0 in | Wt 211.1 lb

## 2023-04-29 DIAGNOSIS — R079 Chest pain, unspecified: Secondary | ICD-10-CM

## 2023-04-29 DIAGNOSIS — F419 Anxiety disorder, unspecified: Secondary | ICD-10-CM

## 2023-04-29 DIAGNOSIS — E876 Hypokalemia: Secondary | ICD-10-CM

## 2023-04-29 DIAGNOSIS — F322 Major depressive disorder, single episode, severe without psychotic features: Secondary | ICD-10-CM

## 2023-04-29 DIAGNOSIS — R200 Anesthesia of skin: Secondary | ICD-10-CM

## 2023-04-29 DIAGNOSIS — G894 Chronic pain syndrome: Secondary | ICD-10-CM

## 2023-04-29 DIAGNOSIS — F32A Depression, unspecified: Secondary | ICD-10-CM

## 2023-04-29 MED ORDER — ARIPIPRAZOLE 5 MG PO TABS: 5.0000 mg | ORAL_TABLET | Freq: Every day | ORAL | 0 refills | Status: DC

## 2023-04-29 MED ORDER — DULOXETINE HCL 40 MG PO CPEP: 40.0000 mg | ORAL_CAPSULE | Freq: Every day | ORAL | 0 refills | Status: DC

## 2023-04-29 NOTE — Patient Instructions (Addendum)
 It was great to see you! Thank you for allowing me to participate in your care!   Our plans for today:  - I have refilled duloxetine and abilify - We will check potassium again today, please start taking potassium pills  Take care and seek immediate care sooner if you develop any concerns.  Levin Erp, MD

## 2023-04-29 NOTE — Assessment & Plan Note (Signed)
 Low potassium levels likely contributing to numbness and tingling. Potassium prescription not picked up. Consider medication adjustment due to persistent low potassium. - Ensure she picks up potassium prescription at 5 PM. - Consider discontinuing hydrochlorothiazide if potassium remains low. - Increase losartan if hydrochlorothiazide is discontinued. - Follow up early next week for blood pressure check and further evaluation.

## 2023-04-29 NOTE — Assessment & Plan Note (Signed)
 Depression exacerbated by lack of medication and recent stressor. Off duloxetine and Abilify for three weeks. - Refill duloxetine 40 mg and Abilify 5 mg.

## 2023-04-29 NOTE — Progress Notes (Signed)
 SUBJECTIVE:   CHIEF COMPLAINT / HPI: F/u Chest Pain + Numbness  Discussed the use of AI scribe software for clinical note transcription with the patient, who gave verbal consent to proceed.  Urgent care 3/17 EKG within normal limits, versus muscle spasm left trapezius.  Discharged with tizanidine resolved.  Patient Advice 3/20 Patient presents to clinic requesting same day appointment from Urgent Care follow up on 04/25/23. She reports that she has been having left sided trapezius pain that radiates into left upper chest. She feels that pain is worse with movement.  EKG on 04/25/23 was normal. Patient denies shortness of breath or difficulty breathing.  She also reports experiencing numbness and cramps in both feet and legs.  She was prescribed tizanidine at Urgent Care visit, that has not really helped.   ED Visit 3/20 Went to ED for chest pain for past 3 to 4 days.  Bilateral chest and upper back left greater than right.  Worse with movement.  Hypokalemia 2.7 repleted -d/c'd with  Please take 40 MEQ (2 tablets) x 1 then take 20 MEQ (1 tablet) daily for the next week. EKG w/o STEMI, delta troponin negative, LR Well's, CXR wnl. Given tylenol and lidoderm patches with resolution of pain. History of Present Illness Today patient, with a history of depression, presents with a three-day history of upper chest pain, which is positional and likely musculoskeletal in origin. The patient also reports intermittent numbness in the second fingers of both hands and the big toes, which is particularly noticeable upon waking. The patient denies any weakness in the limbs, episodes of passing out, or speech difficulties. The patient's potassium levels were found to be low. The patient has been off her prescribed duloxetine and Abilify for depression for the past three weeks. Recent death in the family 3 days ago. CP has improved with lidocaine patch.     04/15/2023   11:35 AM 04/14/2023    9:08 AM 03/28/2023    10:43 AM  PHQ9 SCORE ONLY  PHQ-9 Total Score 0 0 0    PERTINENT  PMH / PSH: HTN, anxiety, depression, GERD, migraines, tension headaches, chronic pain syndrome  OBJECTIVE:   BP 117/77   Pulse 73   Ht 5\' 2"  (1.575 m)   Wt 211 lb 2 oz (95.8 kg)   LMP 04/13/2023 (Exact Date)   SpO2 100%   BMI 38.62 kg/m    General: Well appearing, NAD, awake, alert, responsive to questions Head: Normocephalic atraumatic CV: Regular rate and rhythm no murmurs rubs or gallops Respiratory: Clear to ausculation bilaterally, no wheezes rales or crackles, chest rises symmetrically,  no increased work of breathing Abdomen: Soft, non-tender, non-distended, normoactive bowel sounds  Extremities: Moves upper and lower extremities freely Neuro: CN II: PERRL CN III, IV,VI: EOMI CV V: Normal sensation in V1, V2, V3 CVII: Symmetric smile and brow raise CN VIII: Normal hearing CN IX,X: Symmetric palate raise  CN XI: 5/5 shoulder shrug CN XII: Symmetric tongue protrusion  UE and LE strength 5/5 Normal sensation in UE and LE bilaterally  No ataxia with finger to nose  ASSESSMENT/PLAN:   Assessment & Plan Hypokalemia Low potassium levels likely contributing to numbness and tingling. Potassium prescription not picked up. Consider medication adjustment due to persistent low potassium. - Ensure she picks up potassium prescription at 5 PM. - Consider discontinuing hydrochlorothiazide if potassium remains low. - Increase losartan if hydrochlorothiazide is discontinued. - Follow up early next week for blood pressure check and further evaluation.  Anxiety and depression Depression exacerbated by lack of medication and recent stressor. Off duloxetine and Abilify for three weeks. - Refill duloxetine 40 mg and Abilify 5 mg. Numbness Intermittent, bilateral numbness and tingling in fingers and toes possibly related to low potassium. Normal neurological exam. Recent B12 wnl. - Order BMP to check electrolyte  levels. - Ensure she picks up potassium prescription at 5 PM. Chest pain, unspecified type Intermittent, positional chest pain likely musculoskeletal due to trapezius strain. Cardiac workup negative for myocardial infarction. Pain improved with lidocaine patches. Likely component of recent stressors as well - Continue lidocaine patches for pain management - Restart mood medications   Levin Erp, MD [facility]

## 2023-04-30 ENCOUNTER — Encounter: Payer: Self-pay | Admitting: Student

## 2023-04-30 LAB — BASIC METABOLIC PANEL
BUN/Creatinine Ratio: 14 (ref 9–23)
BUN: 11 mg/dL (ref 6–24)
CO2: 26 mmol/L (ref 20–29)
Calcium: 9.7 mg/dL (ref 8.7–10.2)
Chloride: 98 mmol/L (ref 96–106)
Creatinine, Ser: 0.77 mg/dL (ref 0.57–1.00)
Glucose: 94 mg/dL (ref 70–99)
Potassium: 3.3 mmol/L — ABNORMAL LOW (ref 3.5–5.2)
Sodium: 139 mmol/L (ref 134–144)
eGFR: 97 mL/min/{1.73_m2} (ref >59)

## 2023-05-02 ENCOUNTER — Telehealth: Payer: Self-pay

## 2023-05-02 NOTE — Telephone Encounter (Signed)
 Pharmacy Patient Advocate Encounter   Received notification from CoverMyMeds that prior authorization for ARIPIPRAZOLE 5MG  is required/requested.   Insurance verification completed.   The patient is insured through [facility] .   PA required; PA submitted to above mentioned insurance via CoverMyMeds Key/confirmation #/EOC [employer]. Status is pending

## 2023-05-02 NOTE — Telephone Encounter (Signed)
 Pharmacy Patient Advocate Encounter  Received notification from [facility] that Prior Authorization for ARIPIPRAZOLE has been APPROVED from 05/02/23 to 05/01/24   PA #/Case ID/Reference #: [id number]

## 2023-05-06 ENCOUNTER — Other Ambulatory Visit (HOSPITAL_COMMUNITY): Payer: Self-pay

## 2023-05-06 ENCOUNTER — Ambulatory Visit (INDEPENDENT_AMBULATORY_CARE_PROVIDER_SITE_OTHER): Payer: MEDICAID | Admitting: Student

## 2023-05-06 ENCOUNTER — Telehealth: Payer: Self-pay

## 2023-05-06 VITALS — BP 120/82 | HR 63 | Wt 216.6 lb

## 2023-05-06 DIAGNOSIS — N926 Irregular menstruation, unspecified: Secondary | ICD-10-CM

## 2023-05-06 DIAGNOSIS — F322 Major depressive disorder, single episode, severe without psychotic features: Secondary | ICD-10-CM

## 2023-05-06 DIAGNOSIS — F419 Anxiety disorder, unspecified: Secondary | ICD-10-CM

## 2023-05-06 DIAGNOSIS — G894 Chronic pain syndrome: Secondary | ICD-10-CM

## 2023-05-06 DIAGNOSIS — F32A Depression, unspecified: Secondary | ICD-10-CM

## 2023-05-06 LAB — POCT URINE PREGNANCY: Preg Test, Ur: NEGATIVE

## 2023-05-06 NOTE — Telephone Encounter (Signed)
 Pharmacy Patient Advocate Encounter   Received notification from Patient Pharmacy that prior authorization for Duloxetine is required/requested.   Insurance verification completed.   The patient is insured through [facility] .   PA required; PA submitted to above mentioned insurance via CoverMyMeds Key/confirmation #/EOC [id number]. Status is pending

## 2023-05-06 NOTE — Progress Notes (Addendum)
    SUBJECTIVE:   CHIEF COMPLAINT / HPI:   Kendra Harris is a 43 y.o.  presenting for missed menses.  Last menstrual period: 03/19/23  She has been nauseous, vomiting in the mornings.  She feels like she could be pregnant.  She is not currently on birth control and has been having unprotected sex.  She does not desire any future pregnancies.  Of note kids at home have been sick with viral gastroenteritis.  PERTINENT  PMH / PSH: reviewed and updated.  OBJECTIVE:   BP 120/82   Pulse 63   Wt 216 lb 9.6 oz (98.2 kg)   LMP 04/13/2023 (Exact Date)   SpO2 100%   BMI 39.62 kg/m   Well-appearing, no acute distress Cardio: Regular rate, regular rhythm, no murmurs on exam. Pulm: Clear, no wheezing, no crackles. No increased work of breathing Abdominal: bowel sounds present, soft, non-tender, non-distended Extremities: no peripheral edema  Neuro: alert and oriented x3, speech normal in content, no facial asymmetry, strength intact and equal bilaterally in UE and LE, pupils equal and reactive to light.  Psych:  Cognition and judgment appear intact. Alert, communicative  and cooperative with normal attention span and concentration. No apparent delusions, illusions, hallucinations    ASSESSMENT/PLAN:   Assessment & Plan Missed period Urine pregnancy: Negative.  Discussed options of birth control with patient in the room.  She reports she will think about birth control options and discuss this further at the next visit.  Discussed that she is on medications that are teratogenic including losartan.  If patient is desiring pregnancy in the future, will need to adjust medications.  Most likely nausea and vomiting are secondary to viral illness at home.      Glendale Chard, DO [place] [facility]

## 2023-05-09 NOTE — Telephone Encounter (Signed)
 Pharmacy Patient Advocate Encounter  Received notification from [facility] that Prior Authorization for DULOXETINE 40MG  has been DENIED.  Full denial letter will be uploaded to the media tab. See denial reason below.    Please send RX for Duloxetine capsules 20MG , 1 tablet twice daily.   PA #/Case ID/Reference #: [id number]

## 2023-05-11 NOTE — Progress Notes (Deleted)
    SUBJECTIVE:   CHIEF COMPLAINT / HPI:   BP check - Issues with hypokalemia recently  Taking duloxetine and abilify?  PERTINENT  PMH / PSH: HTN, GERD, headache, MDD  OBJECTIVE:   LMP 04/13/2023 (Exact Date)   ***  ASSESSMENT/PLAN:   Assessment & Plan      Para March, DO [facility]

## 2023-05-12 ENCOUNTER — Ambulatory Visit: Payer: MEDICAID | Admitting: Family Medicine

## 2023-05-17 ENCOUNTER — Ambulatory Visit: Payer: MEDICAID | Admitting: Student

## 2023-05-17 VITALS — BP 132/80 | HR 65 | Wt 216.6 lb

## 2023-05-17 DIAGNOSIS — N926 Irregular menstruation, unspecified: Secondary | ICD-10-CM

## 2023-05-17 DIAGNOSIS — G44209 Tension-type headache, unspecified, not intractable: Secondary | ICD-10-CM

## 2023-05-17 DIAGNOSIS — F322 Major depressive disorder, single episode, severe without psychotic features: Secondary | ICD-10-CM | POA: Diagnosis not present

## 2023-05-17 DIAGNOSIS — M549 Dorsalgia, unspecified: Secondary | ICD-10-CM

## 2023-05-17 LAB — POCT URINALYSIS DIP (MANUAL ENTRY)
Bilirubin, UA: NEGATIVE
Blood, UA: NEGATIVE
Glucose, UA: NEGATIVE mg/dL
Leukocytes, UA: NEGATIVE
Nitrite, UA: NEGATIVE
Protein Ur, POC: 30 mg/dL — AB
Spec Grav, UA: 1.025 (ref 1.010–1.025)
Urobilinogen, UA: 1 U/dL
pH, UA: 6.5 (ref 5.0–8.0)

## 2023-05-17 LAB — POCT URINE PREGNANCY: Preg Test, Ur: NEGATIVE

## 2023-05-17 LAB — POCT UA - MICROSCOPIC ONLY
RBC, Urine, Miroscopic: NONE SEEN (ref 0–2)
WBC, Ur, HPF, POC: NONE SEEN (ref 0–5)

## 2023-05-17 NOTE — Assessment & Plan Note (Addendum)
 Continue duloxetine 40 mg.  Patient was referred to psychiatry at the last visit.  She has not heard back.  She is not currently suicidal.  She has a safety plan in place.

## 2023-05-17 NOTE — Patient Instructions (Signed)
 I would like for you to try taking magnesium oxide 400 to 600 mg daily.  This can cause diarrhea and gastrointestinal discomfort.  If you have side effects to this medication you can stop taking it.  I am sending a referral to neurology and make sure that you get an appointment soon.

## 2023-05-17 NOTE — Assessment & Plan Note (Signed)
 Clinically does not meet criteria for UTI.  Urine dipstick was negative for infection.  Continue to monitor clinically

## 2023-05-17 NOTE — Assessment & Plan Note (Addendum)
 Suspect element of medication overuse syndrome.  Instructed patient to reduce the amount of time she takes Imitrex and NSAIDs.  Prescribed 400 to 600 mg magnesium to be taken daily.  Caution patient on GI side effects.  Patient will need to follow-up with neurology for additional workup and management.  Reassuringly exam normal and head CT normal.

## 2023-05-17 NOTE — Progress Notes (Signed)
    SUBJECTIVE:   CHIEF COMPLAINT / HPI:   Kendra Harris is a 43 y.o. female presenting for follow-up for tension headaches/medication overuse headaches and depression/anxiety.  Tension headaches: Have worsened intermittently.  Most recent worsening was 1 week ago.  She has tried multiple medications without significant relief.  She is currently taking Imitrex, Zanaflex, Fioricet, duloxetine 40 mg.  She reports this has not caused any relief for her.  The headaches are located in the same spot as usual which is across the forehead.  It is a constant pain that will sometimes exacerbate.  Depression/anxiety: Increasing the duloxetine to 40 mg at last visit did not seem to improve her anxiety and depression.  She still struggles with both of these.  She has some thoughts of hurting herself but she reports she would never act on it.  She does not currently have a plan for suicide.  Missed period:  She is still not had a menstrual cycle since January.  She like a pregnancy test today.  She is not currently on birth control.  Back pain: Patient reports she is having muscle spasm/back pain bilaterally.  She is concerned that this is a urinary tract infection.  She is afebrile, no urinary frequency, no hematuria, no dysuria, no suprapubic tenderness.  PERTINENT  PMH / PSH: reviewed and updated.  OBJECTIVE:   BP 132/80   Pulse 65   Wt 216 lb 9.6 oz (98.2 kg)   LMP 03/19/2023 (Exact Date)   SpO2 100%   BMI 39.62 kg/m   Well-appearing, no acute distress Cardio: Regular rate, regular rhythm, no murmurs on exam. Pulm: Clear, no wheezing, no crackles. No increased work of breathing Abdominal: bowel sounds present, soft, non-tender, non-distended Extremities: no peripheral edema   Neuro: CN II: PERRL CN III, IV,VI: EOMI CV V: Normal sensation in V1, V2, V3 CVII: Symmetric smile and brow raise CN VIII: Normal hearing CN IX,X: Symmetric palate raise  CN XI: 5/5 shoulder shrug CN XII:  Symmetric tongue protrusion  UE and LE strength 5/5 2+ UE and LE reflexes  Normal sensation in UE and LE bilaterally  No ataxia with finger to nose, normal heel to shin  Negative Rhomberg    ASSESSMENT/PLAN:   Assessment & Plan Back pain, unspecified back location, unspecified back pain laterality, unspecified chronicity Clinically does not meet criteria for UTI.  Urine dipstick was negative for infection.  Continue to monitor clinically Missed period UA negative for pregnancy.  Continue to discussed birth control with patient Tension headache Suspect element of medication overuse syndrome.  Instructed patient to reduce the amount of time she takes Imitrex and NSAIDs.  Prescribed 400 to 600 mg magnesium to be taken daily.  Caution patient on GI side effects.  Patient will need to follow-up with neurology for additional workup and management.  Reassuringly exam normal and head CT normal. MDD (major depressive disorder), severe (HCC) Continue duloxetine 40 mg.  Patient was referred to psychiatry at the last visit.  She has not heard back.  She is not currently suicidal.  She has a safety plan in place.     Glendale Chard, DO [place] [facility]

## 2023-05-19 ENCOUNTER — Encounter: Payer: Self-pay | Admitting: Student

## 2023-05-19 ENCOUNTER — Ambulatory Visit (INDEPENDENT_AMBULATORY_CARE_PROVIDER_SITE_OTHER): Payer: MEDICAID | Admitting: Student

## 2023-05-19 VITALS — BP 134/89 | HR 69 | Ht 62.0 in | Wt 216.5 lb

## 2023-05-19 DIAGNOSIS — K5904 Chronic idiopathic constipation: Secondary | ICD-10-CM | POA: Diagnosis not present

## 2023-05-19 MED ORDER — SENNA 8.6 MG PO TABS: 1.0000 | ORAL_TABLET | Freq: Every day | ORAL | 0 refills | Status: DC | PRN

## 2023-05-19 MED ORDER — POLYETHYLENE GLYCOL 3350 17 GM/SCOOP PO POWD: 17.0000 g | Freq: Every day | ORAL | 1 refills | Status: DC | PRN

## 2023-05-19 NOTE — Patient Instructions (Signed)
 Pleasure to meet you today.  Suspect your abdominal pain could be due to constipation.  I have sent in prescription for MiraLAX which you should use daily until you have a bowel movement.  And afterwards you can do it as needed if you have not had a bowel movement in a day or 2.  If pain continue after bowel movement  please return and we could consider imagining/ xray

## 2023-05-19 NOTE — Assessment & Plan Note (Signed)
 Chronic idiopathic constipation with recent exacerbation. Pain likely related to constipation, possibly contributing to back pain. Differential includes menstrual-related pain. Pregnancy test and UA were negative from 2 days ago. - Prescribed Miralax daily until bowel movement, then as needed if no bowel movement for 1-2 days. - Prescribed Senna to aid in bowel movement. - Advised to return if pain persists for potential imaging.

## 2023-05-19 NOTE — Assessment & Plan Note (Signed)
 Chronic with recent exacerbation. Pain may be related to constipation and abdominal pressure. Exam unremarkable and no red flag symptoms of saddle paresthesia or incontinence  - Monitor back pain in relation to bowel movements. - Advised to return if back pain persists after addressing constipation.

## 2023-05-19 NOTE — Progress Notes (Signed)
    SUBJECTIVE:   CHIEF COMPLAINT / HPI:   Kendra Harris, a patient with a history of back pain, presents with a new episode of back pain that is different from her usual back pain. This episode lasted a couple of hours, went away, and then returned. The pain is located in the lower back and does not radiate down the legs. There is no associated numbness or tingling sensation. The patient denies any recent injury to the back or any heavy lifting.  In addition to the back pain, the patient also reports lower abdominal pain that has been present for two days. The pain has eased up significantly today. The patient also mentions constipation, with her last bowel movement being several days ago. She has been able to pass gas but has not had a bowel movement. The patient denies any nausea or vomiting. She also denies any blood in her stool.   PERTINENT  PMH / PSH: Reviewed   OBJECTIVE:   BP 134/89   Pulse 69   Ht 5\' 2"  (1.575 m)   Wt 216 lb 8 oz (98.2 kg)   LMP 03/19/2023 (Exact Date)   SpO2 98%   BMI 39.60 kg/m    Physical Exam General: Alert, well appearing, NAD Cardiovascular: RRR, No Murmurs, Normal S2/S2 Respiratory: CTAB, No wheezing or Rales Abdomen: No distension, no tenderness or guarding Extremities: No edema on extremities, 5/5 strength on all extremities with sensation intact. Normal range of motion.   ASSESSMENT/PLAN:   Chronic idiopathic constipation Chronic idiopathic constipation with recent exacerbation. Pain likely related to constipation, possibly contributing to back pain. Differential includes menstrual-related pain. Pregnancy test and UA were negative from 2 days ago. - Prescribed Miralax daily until bowel movement, then as needed if no bowel movement for 1-2 days. - Prescribed Senna to aid in bowel movement. - Advised to return if pain persists for potential imaging.  Back pain Chronic with recent exacerbation. Pain may be related to constipation and  abdominal pressure. Exam unremarkable and no red flag symptoms of saddle paresthesia or incontinence  - Monitor back pain in relation to bowel movements. - Advised to return if back pain persists after addressing constipation.    Jerre Simon, MD [facility]

## 2023-05-26 ENCOUNTER — Ambulatory Visit (HOSPITAL_COMMUNITY): Admission: RE | Admit: 2023-05-26 | Payer: MEDICAID | Source: Ambulatory Visit

## 2023-05-26 ENCOUNTER — Ambulatory Visit (INDEPENDENT_AMBULATORY_CARE_PROVIDER_SITE_OTHER): Payer: MEDICAID | Admitting: Student

## 2023-05-26 VITALS — BP 133/84 | HR 60 | Ht 62.0 in | Wt 218.6 lb

## 2023-05-26 DIAGNOSIS — K089 Disorder of teeth and supporting structures, unspecified: Secondary | ICD-10-CM

## 2023-05-26 DIAGNOSIS — E876 Hypokalemia: Secondary | ICD-10-CM

## 2023-05-26 DIAGNOSIS — K0889 Other specified disorders of teeth and supporting structures: Secondary | ICD-10-CM

## 2023-05-26 DIAGNOSIS — I10 Essential (primary) hypertension: Secondary | ICD-10-CM

## 2023-05-26 MED ORDER — DULOXETINE HCL 20 MG PO CPEP: 20.0000 mg | ORAL_CAPSULE | Freq: Two times a day (BID) | ORAL | 0 refills | Status: AC

## 2023-05-26 MED ORDER — POTASSIUM CHLORIDE CRYS ER 20 MEQ PO TBCR
EXTENDED_RELEASE_TABLET | ORAL | 0 refills | Status: DC
Start: 2023-05-26 — End: 2023-08-11

## 2023-05-26 MED ORDER — CHLORHEXIDINE GLUCONATE 0.12 % MT SOLN: 15.0000 mL | Freq: Two times a day (BID) | OROMUCOSAL | 1 refills | Status: DC

## 2023-05-26 NOTE — Telephone Encounter (Signed)
 Informed patient while in clinic that her rx was sent to the pharmacy.

## 2023-05-26 NOTE — Patient Instructions (Signed)
 It was great to see you! Thank you for allowing me to participate in your care!  I recommend that you always bring your medications to each appointment as this makes it easy to ensure you are on the correct medications and helps us  not miss when refills are needed.  Our plans for today:  - Can take tylenol 1000 mg every 6 hours and ibuprofen 600 mg every 6 hours as needed for pain. Do not take ibuprofen with other NSAIDs like aleve, advil or naproxen - Use mouth wash sent to pharmacy twice daily after brushing - see if you can get sooner apt with dentist  - Start potassium daily, I will call you if you need to stop  - Make apt with PCP in next 2 weeks to discuss BP  We are checking some labs today, I will call you if they are abnormal will send you a MyChart message or a letter if they are normal.  If you do not hear about your labs in the next 2 weeks please let us  know.  Take care and seek immediate care sooner if you develop any concerns.   Dr. Glenn Lange, DO [facility]

## 2023-05-26 NOTE — Assessment & Plan Note (Signed)
 Previous labs showed low potassium. Reports muscle weakness and fatigue.  She ran out of previous potassium pills.  Informed about rechecking potassium levels and adjusting supplementation. - Order basic metabolic panel to recheck potassium levels. - Prescribe potassium supplementation to be taken once daily. - Will need to be further evaluated if potassium continues to be low

## 2023-05-26 NOTE — Assessment & Plan Note (Signed)
 Dental pain for five days, exacerbated by brushing and chewing. No abscess or significant swelling. Gingivitis present. Antibiotics not recommended.  Informed about monitoring for systemic infection signs. - Patient advised to schedule appointment with dentist ASAP for evaluation and management. - Prescribe chlorhexidine mouthwash: swish with 15 mL twice daily after brushing. - Recommend acetaminophen or ibuprofen for pain management. - Advise to monitor for signs of systemic infection such as fever, lightheadedness, or tachycardia and seek medical attention if these occur.

## 2023-05-26 NOTE — Progress Notes (Signed)
    SUBJECTIVE:   CHIEF COMPLAINT / HPI:   The patient, with a history of hypertension, presents with dental pain for the past five days. The pain is exacerbated by brushing and chewing. She denies any discharge from the affected area. She also reports general weakness and fatigue, which she attributes to a possible need for potassium supplementation which she has not taken in weeks. No fevers. She has been taking amlodipine, hydrochlorothiazide, and losartan for hypertension, but did not take them on today. Despite this, her blood pressure is within acceptable limits.  PERTINENT  PMH / PSH: HTN  OBJECTIVE:   BP 133/84   Pulse 60   Ht 5\' 2"  (1.575 m)   Wt 218 lb 9.6 oz (99.2 kg)   LMP 03/19/2023 (Exact Date)   SpO2 100%   BMI 39.98 kg/m    General: NAD, pleasant, well-appearing HEENT: White sclera, clear conjunctiva, MMM, very poor dentition, no abscess or drainage of gums noted, gums surrounding teeth are inflamed, no obvious swelling erythema or warmth of skin overlying mandible Cardiac: Well-perfused Respiratory: Breathing comfortably on room air Skin: warm and dry Neuro: alert, no obvious focal deficits Psych: Normal affect and mood   ASSESSMENT/PLAN:   Pain, dental Dental pain for five days, exacerbated by brushing and chewing. No abscess or significant swelling. Gingivitis present. Antibiotics not recommended.  Informed about monitoring for systemic infection signs. - Patient advised to schedule appointment with dentist ASAP for evaluation and management. - Prescribe chlorhexidine mouthwash: swish with 15 mL twice daily after brushing. - Recommend acetaminophen or ibuprofen for pain management. - Advise to monitor for signs of systemic infection such as fever, lightheadedness, or tachycardia and seek medical attention if these occur.  Hypokalemia Previous labs showed low potassium. Reports muscle weakness and fatigue.  She ran out of previous potassium pills.  Informed  about rechecking potassium levels and adjusting supplementation. - Order basic metabolic panel to recheck potassium levels. - Prescribe potassium supplementation to be taken once daily. - Will need to be further evaluated if potassium continues to be low  Essential hypertension, benign On multiple antihypertensives but did not take them today. Blood pressure 133/84 mmHg, suggesting good control. - Advise continuation of current antihypertensive regimen. - Schedule follow-up appointment with primary care provider in 2-4 weeks to reassess blood pressure management and consider medication adjustment as she may be able to come off HCTZ which could potentially help with hypokalemia     Dr. Glenn Lange, DO [facility]

## 2023-05-26 NOTE — Assessment & Plan Note (Addendum)
 On multiple antihypertensives but did not take them today. Blood pressure 133/84 mmHg, suggesting good control. - Advise continuation of current antihypertensive regimen. - Schedule follow-up appointment with primary care provider in 2-4 weeks to reassess blood pressure management and consider medication adjustment as she may be able to come off HCTZ which could potentially help with hypokalemia

## 2023-05-27 ENCOUNTER — Encounter: Payer: Self-pay | Admitting: Student

## 2023-05-27 ENCOUNTER — Ambulatory Visit (HOSPITAL_COMMUNITY): Payer: Self-pay

## 2023-05-27 LAB — BASIC METABOLIC PANEL WITH GFR
BUN/Creatinine Ratio: 10 (ref 9–23)
BUN: 10 mg/dL (ref 6–24)
CO2: 24 mmol/L (ref 20–29)
Calcium: 9.2 mg/dL (ref 8.7–10.2)
Chloride: 102 mmol/L (ref 96–106)
Creatinine, Ser: 0.99 mg/dL (ref 0.57–1.00)
Glucose: 90 mg/dL (ref 70–99)
Potassium: 4.3 mmol/L (ref 3.5–5.2)
Sodium: 139 mmol/L (ref 134–144)
eGFR: 72 mL/min/{1.73_m2} (ref >59)

## 2023-06-03 ENCOUNTER — Ambulatory Visit (HOSPITAL_COMMUNITY): Payer: Self-pay

## 2023-06-10 ENCOUNTER — Ambulatory Visit (INDEPENDENT_AMBULATORY_CARE_PROVIDER_SITE_OTHER): Payer: MEDICAID | Admitting: Student

## 2023-06-10 VITALS — BP 128/78 | HR 98 | Wt 215.0 lb

## 2023-06-10 DIAGNOSIS — G44209 Tension-type headache, unspecified, not intractable: Secondary | ICD-10-CM

## 2023-06-10 MED ORDER — PROPRANOLOL HCL 40 MG PO TABS: 40.0000 mg | ORAL_TABLET | Freq: Two times a day (BID) | ORAL | 0 refills | Status: DC

## 2023-06-10 NOTE — Progress Notes (Addendum)
  SUBJECTIVE:   CHIEF COMPLAINT / HPI:   Headaches: Last seen for this on 4/8 by Dr. Annabell Key.  Advised at that time to reduce the amount of time she takes Imitrex  and NSAIDs and she was prescribed magnesium  to be taken daily which has improved some.  She was advised to follow-up with neurology for additional management.  Head CT normal. She experiences daily tension headaches lasting about an hour, occurring at any time of the day. These are accompanied by nausea and phonophobia, but not photophobia. There are no visual disturbances. She takes Tylenol  and magnesium , with slight improvement from magnesium . Imitrex  and NSAIDs have been discontinued.   Positive PHQ-9 questionnaire 9 score of 1.  Patient notes this was a mistake and she was distracted filling out the form because of her children in the room.  She denies thoughts of SI.  PERTINENT  PMH / PSH: HTN, GERD, MDD, obesity, anxiety/depression  OBJECTIVE:  BP 128/78   Pulse 98   Wt 215 lb (97.5 kg)   LMP 06/03/2023 (Exact Date)   SpO2 98%   BMI 39.32 kg/m  General: Well-appearing, NAD Neck: No C-spine tenderness, normal ROM CV: RRR, no murmurs appreciable MSK: Normal gait Neuro: No focal neurological deficits appreciable, normal speech  ASSESSMENT/PLAN:   Assessment & Plan Tension headache Previous head CT was normal. No red flag symptoms. Likely tension-type headaches with possible migraine component. Imitrex  and NSAIDs were discontinued due to inefficacy and potential overuse. Stress and lifestyle factors may contribute to headache frequency and severity. Neurology referral pending scheduling. - Neurology referral - Continue acetaminophen  and magnesium , initiate propranolol  for prophylaxis - Encourage lifestyle modifications, including diet and exercise   Return if symptoms worsen or fail to improve.  Veronia Goon, DO 06/10/2023, 11:46 AM PGY-3, [facility]

## 2023-06-10 NOTE — Patient Instructions (Addendum)
 It was great to see you today! Thank you for choosing [facility] for your primary care.  Today we addressed: It looks like the referral was denied but we will have our referral person look into this.  I will start you on a preventative migraine medication called propranolol .  You may take this daily.  If you haven't already, sign up for My Chart to have easy access to your labs results, and communication with your primary care physician.  Return if symptoms worsen or fail to improve. Please arrive 15 minutes before your appointment to ensure smooth check in process.  We appreciate your efforts in making this happen.  Thank you for allowing me to participate in your care, Kendra Goon, DO 06/10/2023, 11:17 AM PGY-3, [facility]

## 2023-06-10 NOTE — Assessment & Plan Note (Signed)
 Previous head CT was normal. No red flag symptoms. Likely tension-type headaches with possible migraine component. Imitrex  and NSAIDs were discontinued due to inefficacy and potential overuse. Stress and lifestyle factors may contribute to headache frequency and severity. Neurology referral pending scheduling. - Neurology referral - Continue acetaminophen  and magnesium , initiate propranolol  for prophylaxis - Encourage lifestyle modifications, including diet and exercise

## 2023-06-22 ENCOUNTER — Other Ambulatory Visit: Payer: Self-pay

## 2023-06-22 ENCOUNTER — Encounter (HOSPITAL_COMMUNITY): Payer: Self-pay | Admitting: Emergency Medicine

## 2023-06-22 ENCOUNTER — Ambulatory Visit (HOSPITAL_COMMUNITY)
Admission: EM | Admit: 2023-06-22 | Discharge: 2023-06-22 | Disposition: A | Discharge status: Home / Self Care | Payer: MEDICAID | Attending: Sports Medicine | Admitting: Sports Medicine

## 2023-06-22 DIAGNOSIS — K219 Gastro-esophageal reflux disease without esophagitis: Secondary | ICD-10-CM | POA: Diagnosis not present

## 2023-06-22 DIAGNOSIS — K0889 Other specified disorders of teeth and supporting structures: Secondary | ICD-10-CM | POA: Insufficient documentation

## 2023-06-22 LAB — CBC
HCT: 33.7 % — ABNORMAL LOW (ref 36.0–46.0)
Hemoglobin: 10.2 g/dL — ABNORMAL LOW (ref 12.0–15.0)
MCH: 24.4 pg — ABNORMAL LOW (ref 26.0–34.0)
MCHC: 30.3 g/dL (ref 30.0–36.0)
MCV: 80.6 fL (ref 80.0–100.0)
Platelets: 319 10*3/uL (ref 150–400)
RBC: 4.18 MIL/uL (ref 3.87–5.11)
RDW: 14 % (ref 11.5–15.5)
WBC: 8.4 10*3/uL (ref 4.0–10.5)
nRBC: 0 % (ref 0.0–0.2)

## 2023-06-22 NOTE — ED Provider Notes (Signed)
 [facility]    CSN: [id number] Arrival date & time: 06/22/23  8469      History   Chief Complaint Chief Complaint  Patient presents with   Abdominal Pain    HPI Kendra Harris is a 43 y.o. female here with 4 days of bloating abdominal pain with reflux and associated nausea. States this feels like her typical reflux. Reports a dose increase of her Omeprazole  back in March from 20mg  every day to 40mg  every day. Denies having ever had an upper endoscopy. Previously was on famotidine  and reports this wasn't helpful either. Denies weight loss, dysphagia, odynophagia, or melena/BRBPR.  She has also been dealing with dental pain for the past month. She is concerned she has a dental infection causing her abdominal pain and is requesting blood work for this. Has tried to get in with her dentist, but cannot get an appointment until the end of the month. Denies fevers/chills.   Abdominal Pain   Past Medical History:  Diagnosis Date   DELAYED MENSES 02/11/2010   Qualifier: Diagnosis of  By: Leilani Punter  MD, Khary     Hypertension    MIGRAINE HEADACHE 12/13/2007   Urinary tract infection 03/04/2011    Patient Active Problem List   Diagnosis Date Noted   Chronic pain syndrome 03/20/2023   MDD (major depressive disorder), severe (HCC) 03/15/2023   Systolic murmur 01/14/2023   Epidermoid cyst of skin of chest 04/29/2022   Trapezius muscle strain 02/07/2022   GERD (gastroesophageal reflux disease) 05/07/2021   Tension headache 05/07/2021   Hypokalemia 03/04/2021   Anemia 04/11/2019   Menorrhagia 04/27/2013   Chronic idiopathic constipation 07/30/2011   Pain, dental 12/15/2010   Anxiety and depression 07/24/2010   Back pain 02/11/2010   Essential hypertension, benign 08/31/2007   Morbid obesity with BMI of 45.0-49.9, adult (HCC) 04/07/2006    Past Surgical History:  Procedure Laterality Date   NO PAST SURGERIES      OB History     Gravida  5   Para  5   Term  5    Preterm  0   AB  0   Living  5      SAB  0   IAB  0   Ectopic  0   Multiple  0   Live Births  5            Home Medications    Prior to Admission medications   Medication Sig Start Date End Date Taking? Authorizing Provider  amLODipine  (NORVASC ) 10 MG tablet Take 1 tablet (10 mg total) by mouth daily. Patient taking differently: Take 10 mg by mouth daily. PATIENT IS TAKING THIS MEDICINE 03/21/23 04/20/23  Rosalene Colon, NP  ARIPiprazole  (ABILIFY ) 5 MG tablet Take 1 tablet (5 mg total) by mouth daily. 04/29/23   Genora Kidd, MD  chlorhexidine  (PERIDEX ) 0.12 % solution 15 mLs by Mouth Rinse route 2 (two) times daily. Use after brushing, swish and spit. 05/26/23   Glenn Lange, DO  DULoxetine  (CYMBALTA ) 20 MG capsule Take 1 capsule (20 mg total) by mouth 2 (two) times daily. 05/26/23   Genora Kidd, MD  hydrochlorothiazide  (HYDRODIURIL ) 25 MG tablet Take 1 tablet (25 mg total) by mouth daily. 04/14/23   Clem Currier, DO  losartan  (COZAAR ) 25 MG tablet Take 1 tablet (25 mg total) by mouth at bedtime. 03/28/23   Everhart, Kirstie, DO  omeprazole  (PRILOSEC) 40 MG capsule Take 1 capsule (40 mg total) by mouth daily. 04/25/23  Banister, Pamela K, MD  polyethylene glycol powder (GLYCOLAX /MIRALAX ) 17 GM/SCOOP powder Take 17 g by mouth daily as needed for moderate constipation or mild constipation. 05/19/23   Goble Last, MD  potassium chloride  SA (KLOR-CON  M) 20 MEQ tablet Take once daily 05/26/23   Glenn Lange, DO  prochlorperazine  (COMPAZINE ) 10 MG tablet Take 1 tablet (10 mg total) by mouth every 8 (eight) hours as needed (for headache). 04/13/23   Limmie Ren, MD  propranolol  (INDERAL ) 40 MG tablet Take 1 tablet (40 mg total) by mouth 2 (two) times daily. 06/10/23   Veronia Goon, DO  senna (SENOKOT) 8.6 MG TABS tablet Take 1 tablet (8.6 mg total) by mouth daily as needed for mild constipation. 05/19/23   Goble Last, MD  traZODone  (DESYREL ) 50 MG tablet Take 1 tablet (50  mg total) by mouth at bedtime as needed for sleep. 03/20/23 04/19/23  Rosalene Colon, NP    Family History Family History  Problem Relation Age of Onset   Healthy Mother    Sickle cell trait Sister     Social History Social History   Tobacco Use   Smoking status: Never    Passive exposure: Never   Smokeless tobacco: Never  Vaping Use   Vaping status: Never Used  Substance Use Topics   Alcohol use: No   Drug use: No     Allergies   Patient has no known allergies.   Review of Systems Review of Systems  Gastrointestinal:  Positive for abdominal pain.     Physical Exam Triage Vital Signs ED Triage Vitals  Encounter Vitals Group     BP 06/22/23 0853 (!) 135/92     Systolic BP Percentile --      Diastolic BP Percentile --      Pulse Rate 06/22/23 0853 69     Resp 06/22/23 0853 18     Temp 06/22/23 0853 98.4 F (36.9 C)     Temp Source 06/22/23 0853 Oral     SpO2 06/22/23 0853 98 %     Weight --      Height --      Head Circumference --      Peak Flow --      Pain Score 06/22/23 0854 3     Pain Loc --      Pain Education --      Exclude from Growth Chart --    No data found.  Updated Vital Signs BP (!) 135/92 (BP Location: Right Arm)   Pulse 69   Temp 98.4 F (36.9 C) (Oral)   Resp 18   LMP 06/03/2023 (Exact Date)   SpO2 98%   Physical Exam Constitutional:      General: She is not in acute distress.    Appearance: She is obese. She is not ill-appearing or toxic-appearing.  HENT:     Head: Normocephalic and atraumatic.     Mouth/Throat:     Mouth: Mucous membranes are moist.     Dentition: Abnormal dentition. Dental tenderness and dental caries present. No gingival swelling or dental abscesses.     Pharynx: No pharyngeal swelling or oropharyngeal exudate.  Cardiovascular:     Rate and Rhythm: Normal rate.     Pulses: Normal pulses.     Heart sounds: Normal heart sounds.  Pulmonary:     Effort: Pulmonary effort is normal.     Breath sounds:  Normal breath sounds.  Abdominal:     Tenderness: There is abdominal tenderness. There  is no right CVA tenderness, left CVA tenderness, guarding or rebound.     Comments: Protuberant abdomen. TTP in epigastric region, remainder non-tender.  Musculoskeletal:        General: No swelling. Normal range of motion.     Cervical back: Normal range of motion and neck supple. No tenderness.  Skin:    General: Skin is warm.     Capillary Refill: Capillary refill takes less than 2 seconds.  Neurological:     General: No focal deficit present.     Mental Status: She is alert and oriented to person, place, and time.  Psychiatric:        Mood and Affect: Mood normal.        Behavior: Behavior normal.      UC Treatments / Results  Labs (all labs ordered are listed, but only abnormal results are displayed) Labs Reviewed  CBC    EKG   Radiology No results found.  Procedures Procedures (including critical care time)  Medications Ordered in UC Medications - No data to display  Initial Impression / Assessment and Plan / UC Course  I have reviewed the triage vital signs and the nursing notes.  Pertinent labs & imaging results that were available during my care of the patient were reviewed by me and considered in my medical decision making (see chart for details).    Patient is well-appearing, hemodynamically stable, afebrile, not tachycardic, not tachypneic, oxygenating well on room air.   Gastroesophageal reflux disease, unspecified whether esophagitis present  Pain, dental Overall, vitals and exam are reassuring. Suspect abdominal pain is more related her her GERD. Recommended increasing her Omeprazole  40mg  to BID and following up with her PCP in the next few weeks to discuss referral to GI for endoscopy given her difficult to control symptoms. No red flags Supportive care discussed for her tooth, recommended dental follow-up. CBC pending to evaluate for Leukocytosis, though low  suspicion for dental infection. Return and ER precautions discussed Patient's questions were answered and they are in agreement with this plan  Final Clinical Impressions(s) / UC Diagnoses   Final diagnoses:  Gastroesophageal reflux disease, unspecified whether esophagitis present  Pain, dental   Discharge Instructions      I recommend you increase your reflux medication (Omeprazole  40mg ) to twice daily. I'd also recommend evaluation with GI for consideration of upper endoscopy - follow-up with your primary care provider for this.  We did collect some blood work today and will contact you with the results. I recommend calling your dentist to be evaluated as soon as they can work you in as well.  ED Prescriptions   None    PDMP not reviewed this encounter.   Marliss Simple, MD 06/22/23 [phone]

## 2023-06-22 NOTE — Discharge Instructions (Addendum)
 I recommend you increase your reflux medication (Omeprazole  40mg ) to twice daily. I'd also recommend evaluation with GI for consideration of upper endoscopy - follow-up with your primary care provider for this.  We did collect some blood work today and will contact you with the results. I recommend calling your dentist to be evaluated as soon as they can work you in as well.

## 2023-06-22 NOTE — ED Triage Notes (Signed)
 Pt reports abd pain and bloated for the past 4 days with some nausea not vomiting.

## 2023-06-23 ENCOUNTER — Ambulatory Visit (HOSPITAL_COMMUNITY): Payer: Self-pay

## 2023-06-23 ENCOUNTER — Telehealth (HOSPITAL_COMMUNITY): Payer: Self-pay | Admitting: *Deleted

## 2023-06-23 NOTE — Telephone Encounter (Signed)
 PT wants to talk to Provider about lab results

## 2023-06-29 ENCOUNTER — Ambulatory Visit: Payer: MEDICAID | Admitting: Student

## 2023-06-29 NOTE — Progress Notes (Deleted)
  SUBJECTIVE:   CHIEF COMPLAINT / HPI:   Urgent care follow-up from 5/14 when she presented with bloating abdominal pain with reflux and nausea and separately dental pain.  Her omeprazole  was increased to 40 mg twice daily and she was advised to follow-up with PCP to discuss referral to GI for endoscopy.  She was advised to have dentistry follow-up regarding her teeth.  PERTINENT  PMH / PSH: HTN, GERD, MDD  OBJECTIVE:  LMP 06/03/2023 (Exact Date)  ***  ASSESSMENT/PLAN:   Assessment & Plan  No follow-ups on file. Kendra Goon, DO 06/29/2023, 8:03 AM PGY-3, [facility] {    This will disappear when note is signed, click to select method of visit    :1}

## 2023-06-30 ENCOUNTER — Ambulatory Visit: Payer: MEDICAID | Admitting: Student

## 2023-07-01 ENCOUNTER — Ambulatory Visit: Payer: MEDICAID | Admitting: Family Medicine

## 2023-07-01 NOTE — Progress Notes (Deleted)
    SUBJECTIVE:   CHIEF COMPLAINT / HPI:   UC f/u Seen 5/14 for reflux and nausea, diagnosed with GERD. Given Omeprazole  40mg  daily  PERTINENT  PMH / PSH: ***  OBJECTIVE:   LMP 06/03/2023 (Exact Date)  ***  General: NAD, pleasant, able to participate in exam Cardiac: RRR, no murmurs. Respiratory: CTAB, normal effort, No wheezes, rales or rhonchi Abdomen: Bowel sounds present, nontender, nondistended Extremities: no edema or cyanosis. Skin: warm and dry, no rashes noted Neuro: alert, no obvious focal deficits Psych: Normal affect and mood  ASSESSMENT/PLAN:   No problem-specific Assessment & Plan notes found for this encounter.     Dr. Jonne Netters, DO [place] [facility]    {    This will disappear when note is signed, click to select method of visit    :1}

## 2023-07-05 ENCOUNTER — Ambulatory Visit: Payer: MEDICAID

## 2023-07-06 ENCOUNTER — Ambulatory Visit: Payer: MEDICAID

## 2023-07-07 NOTE — Progress Notes (Deleted)
    SUBJECTIVE:   CHIEF COMPLAINT / HPI:   Urgent care follow-up from 5/14 when she presented with bloating abdominal pain with reflux and nausea and separately dental pain.  Her omeprazole  was increased to 40 mg twice daily and she was advised to follow-up with PCP to discuss referral to GI for endoscopy.  She was advised to have dentistry follow-up regarding her teeth.  Hemoglobin 10.2, needs GI referral  PERTINENT  PMH / PSH: ***  OBJECTIVE:   LMP 06/03/2023 (Exact Date)   ***  ASSESSMENT/PLAN:   Assessment & Plan      Kendra Cumins, DO [facility]

## 2023-07-08 ENCOUNTER — Ambulatory Visit: Payer: MEDICAID | Admitting: Family Medicine

## 2023-07-12 ENCOUNTER — Ambulatory Visit: Payer: MEDICAID | Admitting: Family Medicine

## 2023-07-12 NOTE — Progress Notes (Deleted)
    SUBJECTIVE:   CHIEF COMPLAINT / HPI:   Urgent care follow-up from 5/14 when she presented with bloating abdominal pain with reflux and nausea and separately dental pain.  Her omeprazole  was increased to 40 mg twice daily and she was advised to follow-up with PCP to discuss referral to GI for endoscopy.  She was advised to have dentistry follow-up regarding her teeth.  Hemoglobin 10.2, needs GI referral  PERTINENT  PMH / PSH: ***  OBJECTIVE:   There were no vitals taken for this visit.  ***  ASSESSMENT/PLAN:   Assessment & Plan      Sarahann Cumins, DO [place] [facility]

## 2023-07-14 ENCOUNTER — Encounter: Payer: MEDICAID | Admitting: Family Medicine

## 2023-07-14 NOTE — Progress Notes (Deleted)
    SUBJECTIVE:   CHIEF COMPLAINT / HPI:   Urgent care f/u She was seen in UC a couple of weeks ago with GERD-like symptoms, abdominal pain, as well as dental pain. UC provider recommended increasing omeprazole  to 40 mg BID - she reports today that this did *** help.   Recommended increasing her Omeprazole  40mg  to BID and following up with her PCP in the next few weeks to discuss referral to GI for endoscopy given her difficult to control symptoms.   ***  PERTINENT  PMH / PSH: Reviewed. HTN GERD MDD  OBJECTIVE:   There were no vitals taken for this visit.  *** General: ***-appearing, no acute distress. HEENT: normocephalic, PERRLA, MMM, bilateral TM visualized without erythema or bulging. Cardio: Regular rate, *** rhythm, no murmurs on exam. Pulm: Clear, no wheezing, no crackles. No increased work of breathing. Abdominal: bowel sounds present, soft, non-tender, non-distended. Extremities: no peripheral edema. Moves all extremities equally. Neuro: Alert and oriented x3, speech normal in content, no facial asymmetry, strength intact and equal bilaterally in UE and LE, pupils equal and reactive to light.  Psych:  Cognition and judgment appear intact. Alert, communicative, and cooperative with normal attention span and concentration. No apparent delusions, illusions, hallucinations    ASSESSMENT/PLAN:   Assessment & Plan      Omar Bibber, DO [facility] Center

## 2023-07-15 ENCOUNTER — Ambulatory Visit: Payer: MEDICAID

## 2023-07-15 NOTE — Progress Notes (Deleted)

## 2023-07-18 ENCOUNTER — Ambulatory Visit: Payer: MEDICAID | Admitting: Family Medicine

## 2023-07-18 ENCOUNTER — Telehealth: Payer: Self-pay | Admitting: Family Medicine

## 2023-07-18 NOTE — Telephone Encounter (Signed)
 Patient has had multiple no-shows/late cancellations in the last 2 days. When she called this morning rescheduling her last no-showed visit - I made patient aware of her many no shows and that she is at risk of being dismissed from our practice.   Documenting as an FYI and routing to PCP and Well[facility] geologist.

## 2023-07-18 NOTE — Progress Notes (Deleted)
    SUBJECTIVE:   CHIEF COMPLAINT / HPI:   GERD- increased to omeprazole  40mg  BID at urgent care  Tooth pain- recommended dental follow up   Anemia- Hgb 10.2 at urgent care.   PERTINENT  PMH / PSH: ***  OBJECTIVE:   There were no vitals taken for this visit.  General: A&O, NAD HEENT: No sign of trauma, EOM grossly intact Cardiac: RRR, no m/r/g Respiratory: CTAB, normal WOB, no w/c/r GI: Soft, NTTP, non-distended  Extremities: NTTP, no peripheral edema. Neuro: Normal gait, moves all four extremities appropriately. Psych: Appropriate mood and affect   ASSESSMENT/PLAN:   Assessment & Plan      Kendra Cooter, MD [facility] Center

## 2023-07-19 ENCOUNTER — Ambulatory Visit: Payer: MEDICAID

## 2023-07-21 ENCOUNTER — Ambulatory Visit: Payer: MEDICAID | Admitting: Student

## 2023-07-21 NOTE — Progress Notes (Signed)
 ERRONEOUS ENCOUNTER

## 2023-07-22 ENCOUNTER — Ambulatory Visit (INDEPENDENT_AMBULATORY_CARE_PROVIDER_SITE_OTHER): Payer: MEDICAID | Admitting: Student

## 2023-07-22 VITALS — BP 132/92 | HR 72 | Ht 62.0 in | Wt 225.8 lb

## 2023-07-22 DIAGNOSIS — D649 Anemia, unspecified: Secondary | ICD-10-CM

## 2023-07-22 DIAGNOSIS — K219 Gastro-esophageal reflux disease without esophagitis: Secondary | ICD-10-CM

## 2023-07-22 NOTE — Progress Notes (Signed)
    SUBJECTIVE:   CHIEF COMPLAINT / HPI: Urgent Care F/u  GERD- increased to omeprazole  40mg  BID at urgent care. Tolerating well.  Tooth pain- recommended dental follow up, has not seen yet but did get better.  Anemia- Hgb 10.2 at urgent care.  Reports heavy periods. No blood in stool or urine, no vomit with blood.  PERTINENT  PMH / PSH:   OBJECTIVE:  Blood pressure (!) 132/92, pulse 72, height 5' 2 (1.575 m), weight 225 lb 12.8 oz (102.4 kg), last menstrual period 06/11/2023, SpO2 100%.   General: A&O, NAD HEENT: No sign of trauma, EOM grossly intact. Poor dentition Cardiac: RRR, no m/r/g Respiratory: CTAB, normal WOB, no w/c/r GI: Soft, NTTP, non-distended  Extremities: NTTP, no peripheral edema. Neuro: Normal gait, moves all four extremities appropriately. Psych: Appropriate mood and affect   ASSESSMENT/PLAN:   Assessment & Plan Anemia, unspecified type Unclear etiology, consider IDA, B12 deficiency No concern for acute pathology such as GI  bleed Iron, ferritin, TIBC and B12 today  Gastroesophageal reflux disease, unspecified whether esophagitis present Referral to GI per pt request given persistence of symptoms Discussed avoidance of NSAIDS, spicy foods, other triggers   Quincie Haroon, DO [facility] Medicine Center

## 2023-07-22 NOTE — Assessment & Plan Note (Signed)
 Referral to GI per pt request given persistence of symptoms Discussed avoidance of NSAIDS, spicy foods, other triggers

## 2023-07-22 NOTE — Patient Instructions (Signed)
 It was great seeing you today.  As we discussed, - Labwork today. I will call if anything is urgent. - Continue taking your omeprazole  - GI referral, they will call within 3-4 weeks to schedule - If dizzy, weak, pass out- go to ER   If you have any questions or concerns, please feel free to call the clinic.   Have a wonderful day,  Dr. Vallorie Gayer [facility] [phone]

## 2023-07-22 NOTE — Assessment & Plan Note (Signed)
 Unclear etiology, consider IDA, B12 deficiency No concern for acute pathology such as GI  bleed Iron, ferritin, TIBC and B12 today

## 2023-07-26 ENCOUNTER — Telehealth: Payer: Self-pay

## 2023-07-26 DIAGNOSIS — D649 Anemia, unspecified: Secondary | ICD-10-CM

## 2023-07-26 NOTE — Telephone Encounter (Signed)
 Patient calls nurse line regarding labs from Friday's visit.   Advised that labs will need to be recollected.   Scheduled patient for Thursday morning.   Will forward to provider to place future orders.   Elsie Halo, RN

## 2023-07-27 LAB — IRON,TIBC AND FERRITIN PANEL

## 2023-07-27 LAB — CBC WITH DIFFERENTIAL/PLATELET

## 2023-07-27 LAB — VITAMIN B12

## 2023-07-28 ENCOUNTER — Ambulatory Visit: Payer: Self-pay | Admitting: Student

## 2023-07-28 ENCOUNTER — Other Ambulatory Visit: Payer: MEDICAID

## 2023-07-28 ENCOUNTER — Ambulatory Visit (INDEPENDENT_AMBULATORY_CARE_PROVIDER_SITE_OTHER): Payer: MEDICAID | Admitting: Student

## 2023-07-28 ENCOUNTER — Telehealth: Payer: Self-pay

## 2023-07-28 VITALS — BP 130/85 | HR 74 | Ht 62.0 in | Wt 219.0 lb

## 2023-07-28 DIAGNOSIS — R319 Hematuria, unspecified: Secondary | ICD-10-CM

## 2023-07-28 DIAGNOSIS — D649 Anemia, unspecified: Secondary | ICD-10-CM

## 2023-07-28 DIAGNOSIS — K649 Unspecified hemorrhoids: Secondary | ICD-10-CM | POA: Diagnosis not present

## 2023-07-28 LAB — POCT URINALYSIS DIP (MANUAL ENTRY)
Bilirubin, UA: NEGATIVE
Glucose, UA: NEGATIVE mg/dL
Ketones, POC UA: NEGATIVE mg/dL
Nitrite, UA: NEGATIVE
Protein Ur, POC: NEGATIVE mg/dL
Spec Grav, UA: 1.025 (ref 1.010–1.025)
Urobilinogen, UA: 0.2 U/dL
pH, UA: 7 (ref 5.0–8.0)

## 2023-07-28 LAB — POCT URINE PREGNANCY: Preg Test, Ur: NEGATIVE

## 2023-07-28 IMAGING — DX DG CHEST 2V
2 series · 2 of 2 positions shown · non-contrast
Comparison: 06/25/2019

CLINICAL DATA: Chest pain.

EXAM:
CHEST - 2 VIEW

[chest pa]
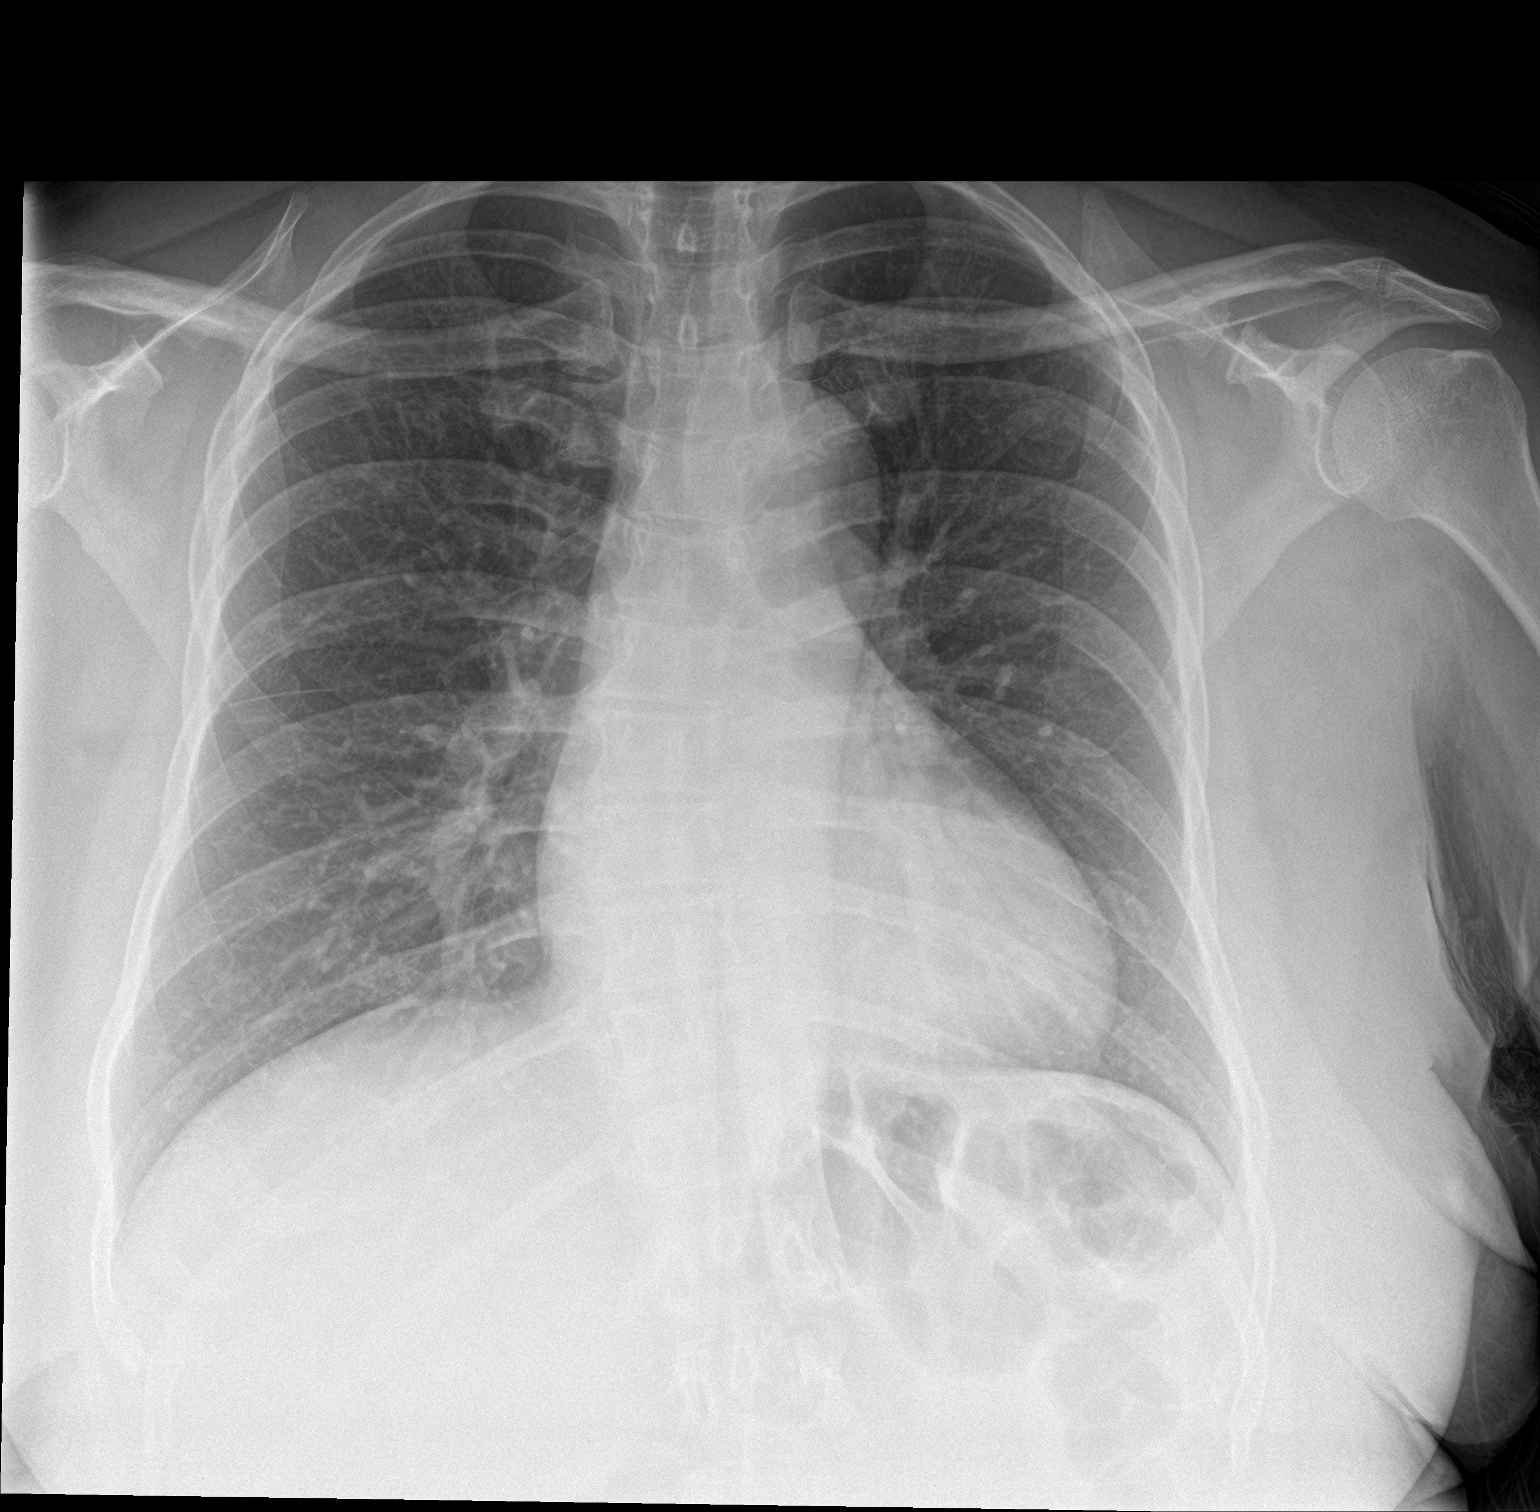

[chest lat]
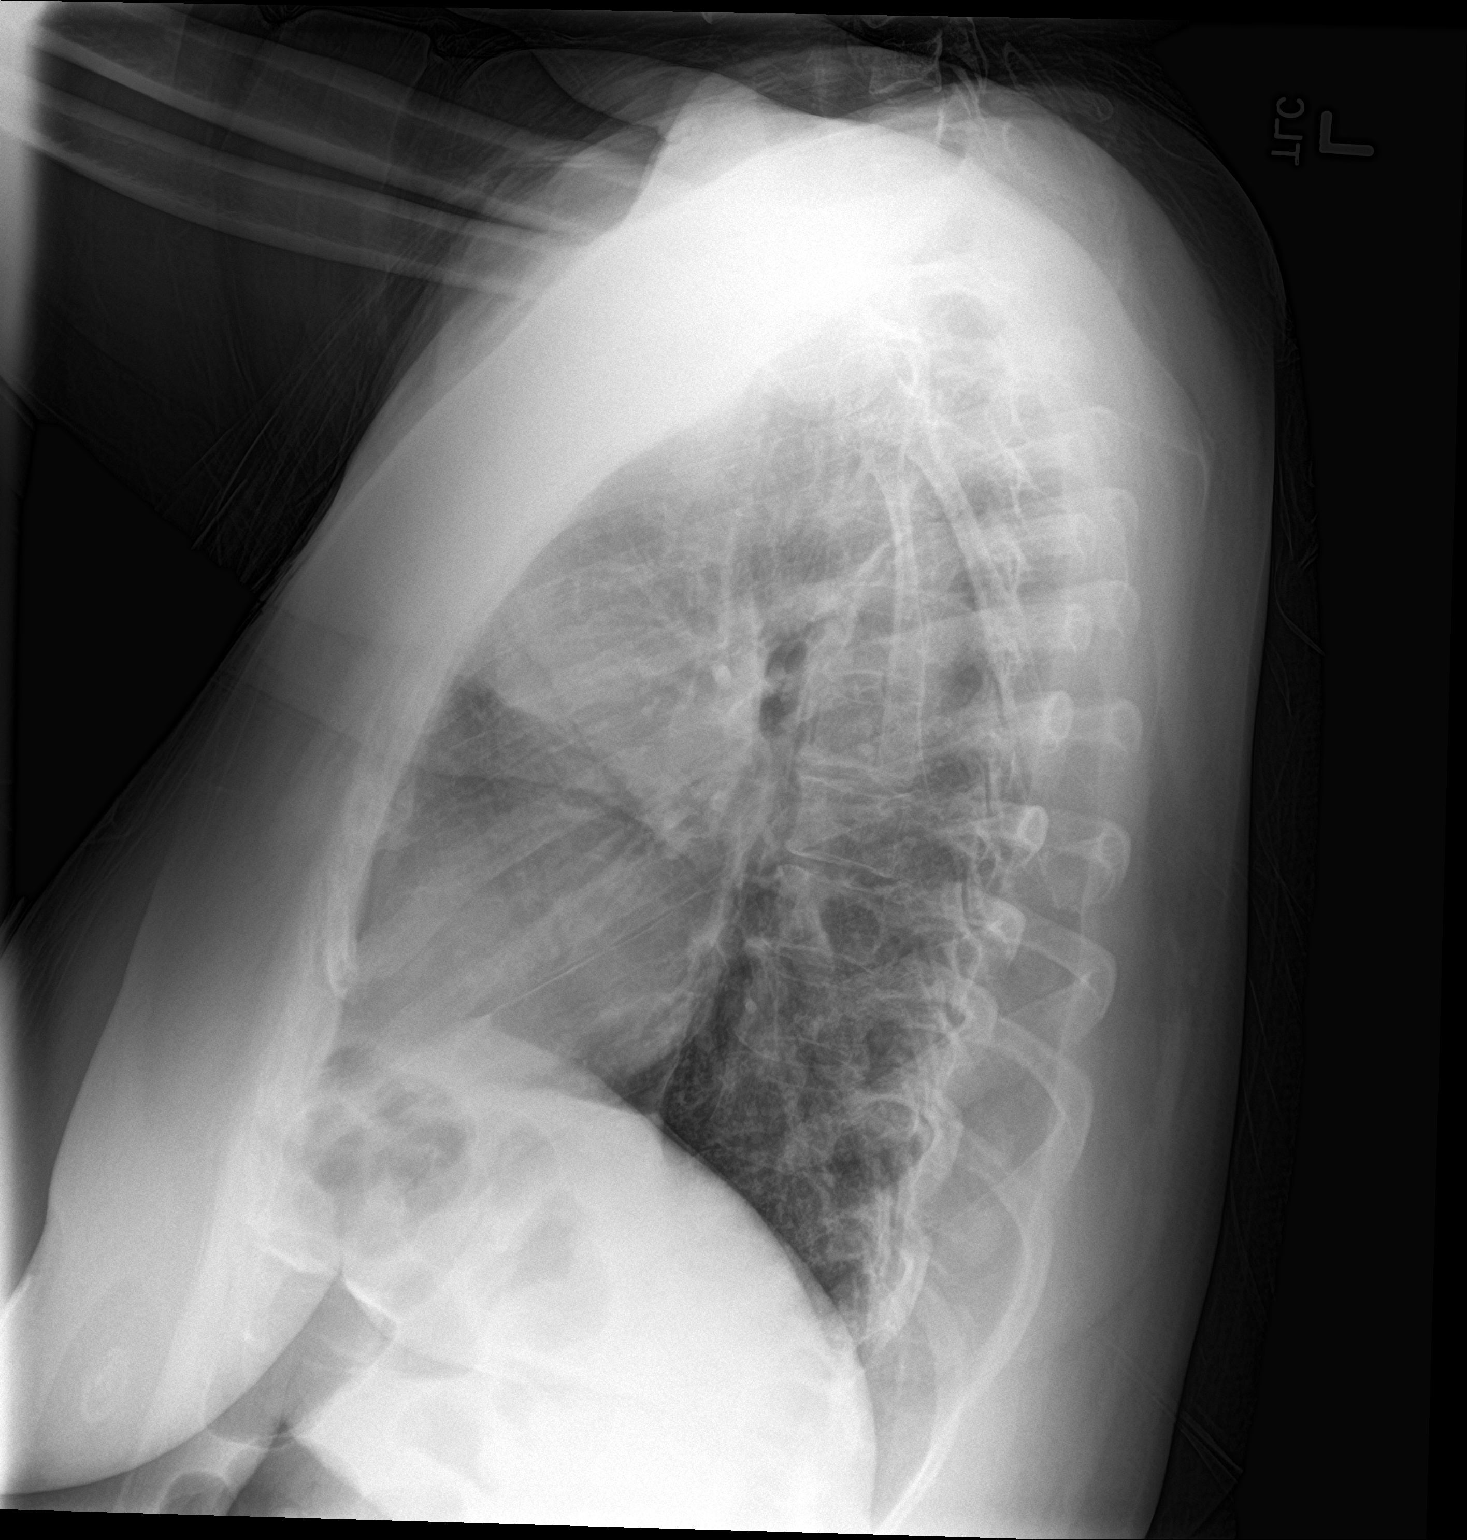

[2 of 2 positions shown; findings below may reference images not displayed]

FINDINGS: Both lungs are clear. Mild curvature in the thoracic spine towards
the right. Heart and mediastinum are within normal limits. Trachea
is midline. No pleural effusions.
IMPRESSION: No active cardiopulmonary disease.

## 2023-07-28 NOTE — Assessment & Plan Note (Addendum)
 Well-appearing, last Hgb ~10. Of unclear etiology, undergoing workup with: -CBC, Iron/Ferritin, UA - Referral to GI for colonoscopy, consider endoscopy Highest suspicion for iron deficiency anemia, menorrhagia, hemorrhoidal bleeding Do not miss dx: ectopic pregnancy given irregular menses for 3 months, checked upreg today. Also malignancy; thus GI referral for colonoscopy If above is negative, may consider pelvic US  to evaluate for structural abnormality/fibroid

## 2023-07-28 NOTE — Telephone Encounter (Signed)
 Patient calls nurse line regarding lab appointment for today.   She reports that she would like to change appointment to provider visit. She reports that this morning she wiped and noticed blood on the toilet paper. She is unsure if this is vaginal or if she is having blood in her urine.   Menstrual cycles are irregular. Reports having slight abdominal pain today, cramping a few days ago.   Denies fever, chills or painful urination.   Patient is concerned as her labs from urgent care showed lower hemoglobin levels.   Scheduled for same day evaluation with Dr. Dameron this afternoon.   Elsie Halo, RN

## 2023-07-28 NOTE — Progress Notes (Signed)
    SUBJECTIVE:   CHIEF COMPLAINT / HPI:   Remains concerned about her anemia -Says when she has been using the bathroom there has been blood on the toilet paper -Unsure if it is coming from her vagina or rectum -Had Iron, TIBC, ferritin drawn with CBC last visit but unable to get results due to lab error, so is mainly here for lab re-draw -Denies dizziness, weakness, fatigue -Has not had a period in months; about 3 months (has been having irregularity) -Thinks she may be starting her period, cramping -She is sexually active, uses condoms but does not have any form of long-term -Strains when she is constipated, thinks she may have hemorrhoids  -Non-smoker. -Kendra Harris was tearful, worried about chronic blood loss being serious  PERTINENT  PMH / PSH: GERD, HTN  OBJECTIVE:   BP 130/85   Pulse 74   Ht 5' 2 (1.575 m)   Wt 219 lb (99.3 kg)   LMP 06/11/2023   SpO2 94%   BMI 40.06 kg/m   General: NAD, well appearing Cardiac: RRR Neuro: A&O Respiratory: normal WOB on RA. No wheezing or crackles on auscultation, good lung sounds throughout Extremities: Moving all 4 extremities equally Psych: intermittently tearful  ASSESSMENT/PLAN:   Anemia Well-appearing, last Hgb ~10. Of unclear etiology, undergoing workup with: -CBC, Iron/Ferritin, UA - Referral to GI for colonoscopy, consider endoscopy Highest suspicion for iron deficiency anemia, menorrhagia, hemorrhoidal bleeding Do not miss dx: ectopic pregnancy given irregular menses for 3 months, checked upreg today. Also malignancy; thus GI referral for colonoscopy If above is negative, may consider pelvic US  to evaluate for structural abnormality/fibroid    Kendra Gayer, DO [facility] Medicine Center

## 2023-07-28 NOTE — Patient Instructions (Signed)
 It was great seeing you today.  As we discussed, - Labs today - If weak, dizzy, pass out, or major bleeding- go to ER   If you have any questions or concerns, please feel free to call the clinic.   Have a wonderful day,  Dr. Vallorie Gayer [facility] [phone]

## 2023-07-29 ENCOUNTER — Telehealth: Payer: Self-pay

## 2023-07-29 LAB — CBC WITH DIFFERENTIAL/PLATELET
Basophils Absolute: 0 10*3/uL (ref 0.0–0.2)
Basos: 1 %
EOS (ABSOLUTE): 0 10*3/uL (ref 0.0–0.4)
Eos: 1 %
Hematocrit: 39.9 % (ref 34.0–46.6)
Hemoglobin: 12.1 g/dL (ref 11.1–15.9)
Immature Grans (Abs): 0 10*3/uL (ref 0.0–0.1)
Immature Granulocytes: 0 %
Lymphocytes Absolute: 1.6 10*3/uL (ref 0.7–3.1)
Lymphs: 21 %
MCH: 24.4 pg — ABNORMAL LOW (ref 26.6–33.0)
MCHC: 30.3 g/dL — ABNORMAL LOW (ref 31.5–35.7)
MCV: 81 fL (ref 79–97)
Monocytes Absolute: 0.9 10*3/uL (ref 0.1–0.9)
Monocytes: 11 %
Neutrophils Absolute: 4.9 10*3/uL (ref 1.4–7.0)
Neutrophils: 66 %
Platelets: 316 10*3/uL (ref 150–450)
RBC: 4.95 x10E6/uL (ref 3.77–5.28)
RDW: 13.3 % (ref 11.7–15.4)
WBC: 7.4 10*3/uL (ref 3.4–10.8)

## 2023-07-29 LAB — IRON,TIBC AND FERRITIN PANEL
Ferritin: 10 ng/mL — ABNORMAL LOW (ref 15–150)
Iron Saturation: 11 % — ABNORMAL LOW (ref 15–55)
Iron: 44 ug/dL (ref 27–159)
Total Iron Binding Capacity: 402 ug/dL (ref 250–450)
UIBC: 358 ug/dL (ref 131–425)

## 2023-07-29 NOTE — Telephone Encounter (Signed)
 Patient calls nurse line requesting recent lab results.  Advised will send to ordering provider.   She reports she does not use her mychart and prefers a telephone call.   Will forward.

## 2023-08-01 ENCOUNTER — Other Ambulatory Visit: Payer: Self-pay | Admitting: Student

## 2023-08-01 MED ORDER — FERROUS SULFATE 325 (65 FE) MG PO TABS: 325.0000 mg | ORAL_TABLET | Freq: Every day | ORAL | 3 refills | Status: AC

## 2023-08-01 NOTE — Telephone Encounter (Signed)
 Called patient and advised her of low ferritin, 11. Hgb actually improved, 12.1.  Sent in iron pills to pharmacy.  Discussed GI referral and they will call to schedule regarding possible need for endoscopy/.

## 2023-08-05 ENCOUNTER — Other Ambulatory Visit: Payer: Self-pay | Admitting: Family Medicine

## 2023-08-05 ENCOUNTER — Ambulatory Visit (INDEPENDENT_AMBULATORY_CARE_PROVIDER_SITE_OTHER): Payer: MEDICAID | Admitting: Student

## 2023-08-05 VITALS — BP 157/89 | HR 59 | Ht 62.0 in | Wt 220.2 lb

## 2023-08-05 DIAGNOSIS — R52 Pain, unspecified: Secondary | ICD-10-CM

## 2023-08-05 LAB — POCT URINALYSIS DIP (MANUAL ENTRY)
Bilirubin, UA: NEGATIVE
Blood, UA: NEGATIVE
Glucose, UA: NEGATIVE mg/dL
Ketones, POC UA: NEGATIVE mg/dL
Nitrite, UA: NEGATIVE
Protein Ur, POC: 30 mg/dL — AB
Spec Grav, UA: 1.02 (ref 1.010–1.025)
Urobilinogen, UA: 0.2 U/dL
pH, UA: 7.5 (ref 5.0–8.0)

## 2023-08-05 LAB — POCT SEDIMENTATION RATE: POCT SED RATE: 36 mm/h — AB (ref 0–22)

## 2023-08-05 MED ORDER — METHOCARBAMOL 500 MG PO TABS: 500.0000 mg | ORAL_TABLET | Freq: Four times a day (QID) | ORAL | 3 refills | Status: DC

## 2023-08-05 NOTE — Patient Instructions (Signed)
 Pleasure to meet you today.  I have ordered some labs today for your generalized pain.  And also have collected urine sample to check your kidney function.  I have also ordered transvaginal ultrasound to look at your reproductive system. You can go to [facility] on [address] [place].  I have also sent in prescription for methocarbamol  which is a muscle relaxer which you can take as needed but not more than 4 times a day.  Please continue to take your Cymbalta  as prescribed.

## 2023-08-05 NOTE — Progress Notes (Unsigned)
    SUBJECTIVE:   CHIEF COMPLAINT / HPI:    The patient presents with sharp, migrating pain and generalized weakness.  They have experienced sharp, migrating pain for five days, affecting the back, abdomen, arms, and legs, with the most intense pain on the sides of the back. The pain is sudden and sharp, with no pain during urination. Generalized weakness is present, with disrupted sleep due to waking up in pain. There is no nausea, vomiting, headaches, or vision issues. Their menstrual period ended a couple of days ago, and they do not typically experience pain with their periods. They are taking Cymbalta , with an unknown dosage, and started iron supplements yesterday due to low iron levels. They do not work and spend their days at home with their children. No heartburn or blood in urine.   PERTINENT  PMH / PSH: Reviewed  OBJECTIVE:   BP (!) 157/89   Pulse (!) 59   Ht 5' 2 (1.575 m)   Wt 220 lb 3.2 oz (99.9 kg)   LMP 06/11/2023   SpO2 100%   BMI 40.28 kg/m    Physical Exam General: Alert, well appearing, NAD Cardiovascular: RRR, No Murmurs, Normal S2/S2 Respiratory: CTAB, No wheezing or Rales Abdomen: Soft, no distension, no rebound tenderness or guarding.  Mild suprapubic tenderness Extremities: No edema on extremities.  Muscle strength 5/5 in all extremities with sensations intact MSK: Mild tenderness on the left lower back. Skin: Warm and dry  ASSESSMENT/PLAN:   Generalized Pain Sharp, intermittent pain in arms, legs, and back radiating into the pelvic.  Unclear of etiology at this time suspect possible fibromyalgia, autoimmune, infectious process or stress related. Previous tests unremarkable except low iron.  Given his lower abdominal pain has been chronic and recently completed her last menses will obtain transvaginal ultrasound to investigate other possible etiologies including endometriosis. - Order laboratory tests including urinalysis, ESR, ANA, TSH. - Prescribe  muscle relaxant. - Continue duloxetine  dosage - Ordered transvaginal ultrasound  Norleen April, MD [facility] Medicine Center   {    This will disappear when note is signed, click to select method of visit    :1}

## 2023-08-07 LAB — BASIC METABOLIC PANEL WITH GFR
BUN/Creatinine Ratio: 11 (ref 9–23)
BUN: 9 mg/dL (ref 6–24)
CO2: 24 mmol/L (ref 20–29)
Calcium: 9.1 mg/dL (ref 8.7–10.2)
Chloride: 100 mmol/L (ref 96–106)
Creatinine, Ser: 0.84 mg/dL (ref 0.57–1.00)
Glucose: 95 mg/dL (ref 70–99)
Potassium: 4 mmol/L (ref 3.5–5.2)
Sodium: 138 mmol/L (ref 134–144)
eGFR: 88 mL/min/{1.73_m2} (ref >59)

## 2023-08-07 LAB — ANA

## 2023-08-07 LAB — TSH: TSH: 2.81 u[IU]/mL (ref 0.450–4.500)

## 2023-08-08 ENCOUNTER — Telehealth: Payer: Self-pay

## 2023-08-08 NOTE — Telephone Encounter (Signed)
 Patient calls nurse line requesting lab results from 6/27.  Will forward to provider who saw patient for concern.

## 2023-08-09 ENCOUNTER — Ambulatory Visit
Admission: RE | Admit: 2023-08-09 | Discharge: 2023-08-09 | Disposition: A | Discharge status: Home / Self Care | Payer: MEDICAID | Source: Ambulatory Visit | Attending: Family Medicine | Admitting: Family Medicine

## 2023-08-09 DIAGNOSIS — R52 Pain, unspecified: Secondary | ICD-10-CM

## 2023-08-10 NOTE — Telephone Encounter (Signed)
Pt calling to check status. Kendra Harris, CMA  

## 2023-08-11 ENCOUNTER — Other Ambulatory Visit (HOSPITAL_COMMUNITY)
Admission: RE | Admit: 2023-08-11 | Discharge: 2023-08-11 | Disposition: A | Discharge status: Home / Self Care | Payer: MEDICAID | Source: Ambulatory Visit | Attending: Family Medicine | Admitting: Family Medicine

## 2023-08-11 ENCOUNTER — Other Ambulatory Visit: Payer: Self-pay

## 2023-08-11 ENCOUNTER — Telehealth: Payer: Self-pay | Admitting: *Deleted

## 2023-08-11 ENCOUNTER — Telehealth: Payer: MEDICAID | Admitting: Physician Assistant

## 2023-08-11 ENCOUNTER — Encounter (HOSPITAL_COMMUNITY): Payer: Self-pay | Admitting: Emergency Medicine

## 2023-08-11 ENCOUNTER — Telehealth: Payer: Self-pay | Admitting: Family Medicine

## 2023-08-11 ENCOUNTER — Ambulatory Visit (INDEPENDENT_AMBULATORY_CARE_PROVIDER_SITE_OTHER): Payer: MEDICAID | Admitting: Family Medicine

## 2023-08-11 ENCOUNTER — Ambulatory Visit (HOSPITAL_COMMUNITY)
Admission: EM | Admit: 2023-08-11 | Discharge: 2023-08-11 | Disposition: A | Discharge status: Home / Self Care | Payer: MEDICAID | Attending: Family Medicine | Admitting: Family Medicine

## 2023-08-11 VITALS — BP 158/98 | HR 60 | Ht 62.0 in | Wt 222.4 lb

## 2023-08-11 DIAGNOSIS — N854 Malposition of uterus: Secondary | ICD-10-CM

## 2023-08-11 DIAGNOSIS — R58 Hemorrhage, not elsewhere classified: Secondary | ICD-10-CM | POA: Diagnosis present

## 2023-08-11 DIAGNOSIS — N8301 Follicular cyst of right ovary: Secondary | ICD-10-CM | POA: Diagnosis not present

## 2023-08-11 DIAGNOSIS — K6289 Other specified diseases of anus and rectum: Secondary | ICD-10-CM

## 2023-08-11 MED ORDER — NIFEDIPINE POWD: 0 refills | Status: AC

## 2023-08-11 NOTE — Progress Notes (Signed)
    SUBJECTIVE:   CHIEF COMPLAINT / HPI:   TR is a 43 yo F that pf pap smear. - Last pap 08/2018 cotesting neg.  - She has previously been seen for workup of bleeding from either vagina or rectum without clear source. This has been causing pt a lot of anxiety, she is worried about malignancy. - She reports she is no longer having bleeding - She reports sensation of fullness in her rectum even after she uses the bathroom.  - Denies pain w/ defecation - Does report sometimes straining with BM - She is still unsure if bleeding is coming from anus or vagina.  - Gets irregular periods. LMP 7/16. She gets them every few months.     OBJECTIVE:   BP (!) 158/98   Pulse 60   Ht 5' 2 (1.575 m)   Wt 222 lb 6.4 oz (100.9 kg)   LMP 06/11/2023   SpO2 100%   BMI 40.68 kg/m   General: Alert, well appearingwoman. NAD. HEENT: NCAT. MMM. CV: RRR, no murmurs.  Resp: CTAB, no wheezing or crackles. Normal WOB on RA.  Abm: Soft, nontender, nondistended. BS present. Ext: Moves all ext spontaneously Skin: Warm, well perfused  GU: whitish-clear physiologic vaginal discharge. Cervical os began bleeding w/ speculum placement, prior to swabbing. No lesions noted on cervix.   Kendra Harris present as chaperone   ASSESSMENT/PLAN:   Assessment & Plan Bleeding Bleeding of unknown source, but has now resolved. Suspect bleeding may have been from a hemorrhoid or fissure given concurrent report of straining and fullness in rectums. Recent transvag US  was unremarkable, which makes interuterine sources less liekly. Will perform pap smear today, as pt is due for cervical cancer screening. Wt is stable - f/u pap - Advised pt to wipe separately front and back to further differentiate where source of bleeding is (if it returns) - Advised fiber and miralax  for constipation - F/u in 1-2 weeks if sensation or bleeding persists. Consider rectal exam at that time.   Kendra Nearing, MD [facility]

## 2023-08-11 NOTE — Telephone Encounter (Signed)
 Called pt, confirmed her DOB. Discussed normal results of recent pelvic ultrasound. She is still having vaginal pain and due for pap. Very concerned for possible malignancy. Scheduled her today at 1:30pm for our earliest spot to have pelvic exam

## 2023-08-11 NOTE — ED Provider Notes (Signed)
 [facility]   [id number] 08/11/23 Arrival Time: 1415  ASSESSMENT & PLAN:  1. Rectal pain    Ques anal fissure. Trial of: Meds ordered this encounter  Medications   NIFEdipine POWD    Sig: Please compound into 0.2% ointment to be applied to anal area tid.    Dispense:  100 g    Refill:  0    Follow-up Information     Schedule an appointment as soon as possible for a visit  with Everhart, Kirstie, DO.   Specialty: Family Medicine Why: For follow up. Contact information: [address] [place] [address] [phone]         Schedule an appointment as soon as possible for a visit  with Surgery, [place].   Specialty: General Surgery Why: If not improving over the next several days. Contact information: [address] [place] [address] [phone]                  Discharge Instructions      I recommend taking Miralax  for the next several days to make sure your bowel movements are soft and regular.     Reviewed expectations re: course of current medical issues. Questions answered. Outlined signs and symptoms indicating need for more acute intervention. Patient verbalized understanding. After Visit Summary given.   SUBJECTIVE: History from: patient.  Kendra Harris is a 43 y.o. female who presents with complaint of rectal pain; noted approx 5 days ago after straining with BM. Denies rectal bleeding. Occas constipation. Las BM two days ago. Sharp pain with BM. Otherwise well. Denies any fever/abd pain. No tx PTA.SABRA  Patient's last menstrual period was 06/11/2023.  Past Surgical History:  Procedure Laterality Date   NO PAST SURGERIES      OBJECTIVE:  Vitals:   08/11/23 1515  BP: (!) 156/99  Pulse: (!) 58  Resp: 20  Temp: 98.1 F (36.7 C)  TempSrc: Oral  SpO2: 96%    General appearance: alert; no distress Abdomen: soft Rectal: (nurse chaperone present) normal exam without masses/hemorrhoids/gross  bleeding; does report sharp pain with digital rectal exam Back: no CVA tenderness Extremities: no edema; symmetrical with no gross deformities Skin: warm; dry Neurologic: normal gait Psychological: alert and cooperative; normal mood and affect   No Known Allergies                                             Past Medical History:  Diagnosis Date   DELAYED MENSES 02/11/2010   Qualifier: Diagnosis of  By: Reynaldo  MD, Khary     Hypertension    MIGRAINE HEADACHE 12/13/2007   Urinary tract infection 03/04/2011   Social History   Socioeconomic History   Marital status: Single    Spouse name: Not on file   Number of children: Not on file   Years of education: Not on file   Highest education level: Not on file  Occupational History   Not on file  Tobacco Use   Smoking status: Never    Passive exposure: Never   Smokeless tobacco: Never  Vaping Use   Vaping status: Never Used  Substance and Sexual Activity   Alcohol use: No   Drug use: No   Sexual activity: Yes    Birth control/protection: Condom  Other Topics Concern   Not on file  Social  History Narrative   Not on file   Social Drivers of Health   Financial Resource Strain: Not on file  Food Insecurity: No Food Insecurity (03/16/2023)   Hunger Vital Sign    Worried About Running Out of Food in the Last Year: Never true    Ran Out of Food in the Last Year: Never true  Transportation Needs: No Transportation Needs (03/16/2023)   PRAPARE - Administrator, Civil Service (Medical): No    Lack of Transportation (Non-Medical): No  Physical Activity: Not on file  Stress: Not on file  Social Connections: Not on file  Intimate Partner Violence: Not At Risk (03/16/2023)   Humiliation, Afraid, Rape, and Kick questionnaire    Fear of Current or Ex-Partner: No    Emotionally Abused: No    Physically Abused: No    Sexually Abused: No   Family History  Problem Relation Age of Onset   Healthy Mother    Sickle cell trait  Sister       Rolinda Rogue, MD 08/11/23 1739

## 2023-08-11 NOTE — Telephone Encounter (Signed)
 Pt calling in wanting to know results of ultrasound, says she has been calling for 3 days. I told her we just got results in and you will give her a call when you have a chance. I think she is worried because they told her she had a cyst at the appointment. Please advise. Saragrace Selke [place], CMA

## 2023-08-11 NOTE — Patient Instructions (Signed)
 Kendra Harris, thank you for joining [name], PA-C for today's virtual visit.  While this provider is not your primary care provider (PCP), if your PCP is located in our provider database this encounter information will be shared with them immediately following your visit.   A [facility] MyChart account gives you access to today's visit and all your visits, tests, and labs performed at [facility]  click here if you don't have a [facility] MyChart account or go to [email or web address]  Consent: (Patient) Kendra Harris provided verbal consent for this virtual visit at the beginning of the encounter.  Current Medications:  Current Outpatient Medications:    amLODipine  (NORVASC ) 10 MG tablet, Take 1 tablet (10 mg total) by mouth daily. (Patient taking differently: Take 10 mg by mouth daily. PATIENT IS TAKING THIS MEDICINE), Disp: 30 tablet, Rfl: 0   ARIPiprazole  (ABILIFY ) 5 MG tablet, Take 1 tablet (5 mg total) by mouth daily., Disp: 60 tablet, Rfl: 0   DULoxetine  (CYMBALTA ) 20 MG capsule, Take 1 capsule (20 mg total) by mouth 2 (two) times daily., Disp: 60 capsule, Rfl: 0   ferrous sulfate  325 (65 FE) MG tablet, Take 1 tablet (325 mg total) by mouth daily with breakfast., Disp: 90 tablet, Rfl: 3   hydrochlorothiazide  (HYDRODIURIL ) 25 MG tablet, Take 1 tablet (25 mg total) by mouth daily., Disp: 90 tablet, Rfl: 3   losartan  (COZAAR ) 25 MG tablet, Take 1 tablet (25 mg total) by mouth at bedtime., Disp: 90 tablet, Rfl: 3   omeprazole  (PRILOSEC) 40 MG capsule, Take 1 capsule (40 mg total) by mouth daily., Disp: 30 capsule, Rfl: 1   polyethylene glycol powder (GLYCOLAX /MIRALAX ) 17 GM/SCOOP powder, Take 17 g by mouth daily as needed for moderate constipation or mild constipation., Disp: 3350 g, Rfl: 1   potassium chloride  SA (KLOR-CON  M) 20 MEQ tablet, Take once daily, Disp: 30 tablet, Rfl: 0   prochlorperazine  (COMPAZINE ) 10 MG tablet, Take 1 tablet (10 mg  total) by mouth every 8 (eight) hours as needed (for headache)., Disp: 30 tablet, Rfl: 0   propranolol  (INDERAL ) 40 MG tablet, Take 1 tablet (40 mg total) by mouth 2 (two) times daily., Disp: 60 tablet, Rfl: 0   senna (SENOKOT) 8.6 MG TABS tablet, Take 1 tablet (8.6 mg total) by mouth daily as needed for mild constipation., Disp: 60 tablet, Rfl: 0   traZODone  (DESYREL ) 50 MG tablet, Take 1 tablet (50 mg total) by mouth at bedtime as needed for sleep., Disp: 30 tablet, Rfl: 0   Medications ordered in this encounter:  No orders of the defined types were placed in this encounter.    *If you need refills on other medications prior to your next appointment, please contact your pharmacy*  Follow-Up: Call back or seek an in-person evaluation if the symptoms worsen or if the condition fails to improve as anticipated.  [facility] [phone]  Other Instructions Make sure to continue to follow-up with your PCP for further discussion and ongoing evaluation of your symptoms.    If you have been instructed to have an in-person evaluation today at a local Urgent Care facility, please use the link below. It will take you to a list of all of our available [facility], including address, phone number and hours of operation. Please do not delay care.  [facility]  If you or a family member do not have a primary care provider, use the link below  to schedule a visit and establish care. When you choose a [facility] primary care physician or advanced practice provider, you gain a long-term partner in health. Find a Primary Care Provider  Learn more about [facility]'s in-office and virtual care options: [facility] - Get Care Now

## 2023-08-11 NOTE — Discharge Instructions (Addendum)
 I recommend taking Miralax  for the next several days to make sure your bowel movements are soft and regular.

## 2023-08-11 NOTE — Patient Instructions (Signed)
 Good to see you today - Thank you for coming in  Things we discussed today:  1) Your pap smear results will take a few days to results, but you may be able to see your results sooner on MyChart.  - We will reach out with any abnormal results.  2) Make an appointment to come back in the next 1-2 weeks if you are still having the abnormal sensation in your bottom. We may need to do a more thorough rectal exam at that time.

## 2023-08-11 NOTE — ED Triage Notes (Signed)
 Rectal pain started 5 days ago.  No history of this.  Patient does have a history of constipation.  Patient has not seen any blood

## 2023-08-11 NOTE — Progress Notes (Signed)
 Virtual Visit Consent   Kendra Harris, you are scheduled for a virtual visit with a [facility] provider today. Just as with appointments in the office, your consent must be obtained to participate. Your consent will be active for this visit and any virtual visit you may have with one of our providers in the next 365 days. If you have a MyChart account, a copy of this consent can be sent to you electronically.  As this is a virtual visit, video technology does not allow for your provider to perform a traditional examination. This may limit your provider's ability to fully assess your condition. If your provider identifies any concerns that need to be evaluated in person or the need to arrange testing (such as labs, EKG, etc.), we will make arrangements to do so. Although advances in technology are sophisticated, we cannot ensure that it will always work on either your end or our end. If the connection with a video visit is poor, the visit may have to be switched to a telephone visit. With either a video or telephone visit, we are not always able to ensure that we have a secure connection.  By engaging in this virtual visit, you consent to the provision of healthcare and authorize for your insurance to be billed (if applicable) for the services provided during this visit. Depending on your insurance coverage, you may receive a charge related to this service.  I need to obtain your verbal consent now. Are you willing to proceed with your visit today? Kendra Harris has provided verbal consent on 08/11/2023 for a virtual visit (video or telephone). Kendra Harris, [place]  Date: 08/11/2023 8:30 AM   Virtual Visit via Video Note   I, Kendra Harris, connected with  Kendra Harris  ([id number], 1980-10-04) on 08/11/23 at  8:15 AM EDT by a video-enabled telemedicine application and verified that I am speaking with the correct person using two identifiers.  Location: Patient: Virtual Visit  Location Patient: Home Provider: Virtual Visit Location Provider: Home Office   I discussed the limitations of evaluation and management by telemedicine and the availability of in person appointments. The patient expressed understanding and agreed to proceed.    History of Present Illness: Kendra Harris is a 43 y.o. who identifies as a female who was assigned female at birth, and is being seen today for questions and concerns regarding recent imaging.  Patient underwent a pelvic and transvaginal ultrasound 2 days ago, ordered by her PCP, due to some ongoing lower abdominal/pelvic pain.  She notes the results are available on MyChart but has not heard from her provider and is very concerned about the possibility it showed something cancerous/dangerous.   HPI: HPI  Problems:  Patient Active Problem List   Diagnosis Date Noted   Chronic pain syndrome 03/20/2023   MDD (major depressive disorder), severe (HCC) 03/15/2023   Systolic murmur 01/14/2023   Epidermoid cyst of skin of chest 04/29/2022   Trapezius muscle strain 02/07/2022   GERD (gastroesophageal reflux disease) 05/07/2021   Tension headache 05/07/2021   Hypokalemia 03/04/2021   Anemia 04/11/2019   Menorrhagia 04/27/2013   Chronic idiopathic constipation 07/30/2011   Pain, dental 12/15/2010   Anxiety and depression 07/24/2010   Back pain 02/11/2010   Essential hypertension, benign 08/31/2007   Morbid obesity with BMI of 45.0-49.9, adult (HCC) 04/07/2006    Allergies: No Known Allergies Medications:  Current Outpatient Medications:    amLODipine  (NORVASC ) 10 MG tablet, Take 1  tablet (10 mg total) by mouth daily. (Patient taking differently: Take 10 mg by mouth daily. PATIENT IS TAKING THIS MEDICINE), Disp: 30 tablet, Rfl: 0   ARIPiprazole  (ABILIFY ) 5 MG tablet, Take 1 tablet (5 mg total) by mouth daily., Disp: 60 tablet, Rfl: 0   DULoxetine  (CYMBALTA ) 20 MG capsule, Take 1 capsule (20 mg total) by mouth 2 (two) times  daily., Disp: 60 capsule, Rfl: 0   ferrous sulfate  325 (65 FE) MG tablet, Take 1 tablet (325 mg total) by mouth daily with breakfast., Disp: 90 tablet, Rfl: 3   hydrochlorothiazide  (HYDRODIURIL ) 25 MG tablet, Take 1 tablet (25 mg total) by mouth daily., Disp: 90 tablet, Rfl: 3   losartan  (COZAAR ) 25 MG tablet, Take 1 tablet (25 mg total) by mouth at bedtime., Disp: 90 tablet, Rfl: 3   omeprazole  (PRILOSEC) 40 MG capsule, Take 1 capsule (40 mg total) by mouth daily., Disp: 30 capsule, Rfl: 1   polyethylene glycol powder (GLYCOLAX /MIRALAX ) 17 GM/SCOOP powder, Take 17 g by mouth daily as needed for moderate constipation or mild constipation., Disp: 3350 g, Rfl: 1   potassium chloride  SA (KLOR-CON  M) 20 MEQ tablet, Take once daily, Disp: 30 tablet, Rfl: 0   prochlorperazine  (COMPAZINE ) 10 MG tablet, Take 1 tablet (10 mg total) by mouth every 8 (eight) hours as needed (for headache)., Disp: 30 tablet, Rfl: 0   propranolol  (INDERAL ) 40 MG tablet, Take 1 tablet (40 mg total) by mouth 2 (two) times daily., Disp: 60 tablet, Rfl: 0   senna (SENOKOT) 8.6 MG TABS tablet, Take 1 tablet (8.6 mg total) by mouth daily as needed for mild constipation., Disp: 60 tablet, Rfl: 0   traZODone  (DESYREL ) 50 MG tablet, Take 1 tablet (50 mg total) by mouth at bedtime as needed for sleep., Disp: 30 tablet, Rfl: 0  Observations/Objective: Patient is well-developed, well-nourished in no acute distress.  Resting comfortably  at home.  Head is normocephalic, atraumatic.  No labored breathing.  Speech is clear and coherent with logical content.  Patient is alert and oriented at baseline.   Assessment and Plan: 1. Follicular cyst of right ovary (Primary)  2. Retroverted uterus  Study Result  Narrative & Impression  CLINICAL DATA:  Pelvic PAIN   EXAM: ULTRASOUND OF PELVIS   TECHNIQUE: Transabdominal and transvaginalultrasound examination of the pelvis was performed including evaluation of the uterus, ovaries,  adnexal regions, and pelvic cul-de-sac.   COMPARISON:  None Available.   FINDINGS: Uterusretroverted measuring 8 x 6 x 5 cm. The endometrium unremarkable, 4 mm at. The uterine cavity is empty. There are no uterine masses.   Right ovary   Dominant simple appearing follicular cyst measures 3 cm. Ovary measurements 5.2 x 5.4 x 3.0 cm.   Left ovary   Unremarkable, 2.1 x 1.9 x 1.3 cm.   Images of the adnexae demonstrated no masses or fluid collections. Color Doppler demonstrated ovarian blood flow.   IMPRESSION: Unremarkable examination of the pelvis.   As noted on imaging report, overall unremarkable examination. Discussed what retroverted uterus means as well as discussion of the simple follicular cyst on her R ovary that her PCP will likely discuss in more detail with her. No alarming findings on imaging. Reassurance given and she agrees to follow-up with her PCP for further discussion and ongoing evaluation of her pelvic pain.  Follow Up Instructions: I discussed the assessment and treatment plan with the patient. The patient was provided an opportunity to ask questions and all were answered. The patient  agreed with the plan and demonstrated an understanding of the instructions.  A copy of instructions were sent to the patient via MyChart unless otherwise noted below.   The patient was advised to call back or seek an in-person evaluation if the symptoms worsen or if the condition fails to improve as anticipated.    Kendra Velma Lunger, PA-C

## 2023-08-11 NOTE — Telephone Encounter (Signed)
 My bad! Whatever you want to do! Larnce Schnackenberg [place], CMA

## 2023-08-12 ENCOUNTER — Emergency Department (HOSPITAL_COMMUNITY)
Admission: EM | Admit: 2023-08-12 | Discharge: 2023-08-12 | Disposition: A | Discharge status: Home / Self Care | Payer: MEDICAID | Attending: Emergency Medicine | Admitting: Emergency Medicine

## 2023-08-12 ENCOUNTER — Encounter (HOSPITAL_COMMUNITY): Payer: Self-pay

## 2023-08-12 DIAGNOSIS — R102 Pelvic and perineal pain: Secondary | ICD-10-CM | POA: Diagnosis not present

## 2023-08-12 DIAGNOSIS — Z79899 Other long term (current) drug therapy: Secondary | ICD-10-CM | POA: Insufficient documentation

## 2023-08-12 DIAGNOSIS — I10 Essential (primary) hypertension: Secondary | ICD-10-CM | POA: Insufficient documentation

## 2023-08-12 DIAGNOSIS — F419 Anxiety disorder, unspecified: Secondary | ICD-10-CM | POA: Diagnosis not present

## 2023-08-12 DIAGNOSIS — K6289 Other specified diseases of anus and rectum: Secondary | ICD-10-CM | POA: Insufficient documentation

## 2023-08-12 MED ORDER — POLYETHYLENE GLYCOL 3350 17 GM/SCOOP PO POWD: 17.0000 g | Freq: Every day | ORAL | 0 refills | Status: DC

## 2023-08-12 NOTE — Discharge Instructions (Signed)
 Your recent pelvic ultrasound did not show any concerning finding.  Your last Pap smear result is currently pending.  Your symptoms should be evaluated by your primary care doctor for further assessment.  You may take MiraLAX  as constipation may cause somewhat of a similar symptom.  You may return to the ER if you develop fever, inability to have bowel movement, blood per rectum or if you have other concern.

## 2023-08-12 NOTE — ED Provider Notes (Signed)
 [place] EMERGENCY DEPARTMENT AT [facility] Provider Note   CSN: [id number] Arrival date & time: 08/12/23  0630     Patient presents with: Vaginal Pain   Kendra Harris is a 43 y.o. female.   The history is provided by the patient, medical records and the EMS personnel. No language interpreter was used.  Vaginal Pain     43 year old female significant history of chronic idiopathic constipation, obesity, hypertension, anxiety, depression, chronic pain brought here via EMS from home with complaints of vaginal pain.  Patient states for the past week she has has been experiencing pain and discomfort which she described as a pressure sensation in the vaginal area in her rectal area.  Symptoms seem to be persistent which makes her anxious.  She does not endorse any fever or chills no nausea vomiting diarrhea no constipation no dysuria hematuria vaginal bleeding or vaginal discharge.  She has been seen by her primary care provider twice for this.  The initial visit a few days ago she had a pelvic ultrasound which came back negative.  She then have a Pap smear done 7 days ago and have not gotten the results yet.  She was also seen at urgent care center for the rectal discomfort and yesterday and was given some cream which has not helped.  She is here with concerns that she may have rectal cancer.  Patient however denies any fever night sweats or weight loss and denies any history of alcohol or tobacco use.  She does endorse a family history of cancer.  Her last bowel movement was 2 days ago.    Prior to Admission medications   Medication Sig Start Date End Date Taking? Authorizing Provider  amLODipine  (NORVASC ) 10 MG tablet Take 1 tablet (10 mg total) by mouth daily. Patient taking differently: Take 10 mg by mouth daily. PATIENT IS TAKING THIS MEDICINE 03/21/23 04/20/23  Nicholaus Brad RAMAN, NP  DULoxetine  (CYMBALTA ) 20 MG capsule Take 1 capsule (20 mg total) by mouth 2 (two) times daily.  05/26/23   Christia Budds, MD  ferrous sulfate  325 (65 FE) MG tablet Take 1 tablet (325 mg total) by mouth daily with breakfast. 08/01/23   Dameron, Barabara, DO  hydrochlorothiazide  (HYDRODIURIL ) 25 MG tablet Take 1 tablet (25 mg total) by mouth daily. 04/14/23   Cleotilde Perkins, DO  losartan  (COZAAR ) 25 MG tablet Take 1 tablet (25 mg total) by mouth at bedtime. 03/28/23   Everhart, Kirstie, DO  NIFEdipine  POWD Please compound into 0.2% ointment to be applied to anal area tid. 08/11/23   Rolinda Rogue, MD  omeprazole  (PRILOSEC) 40 MG capsule Take 1 capsule (40 mg total) by mouth daily. 04/25/23   Vonna Sharlet POUR, MD  prochlorperazine  (COMPAZINE ) 10 MG tablet Take 1 tablet (10 mg total) by mouth every 8 (eight) hours as needed (for headache). 04/13/23   Marlee Lynwood NOVAK, MD  traZODone  (DESYREL ) 50 MG tablet Take 1 tablet (50 mg total) by mouth at bedtime as needed for sleep. 03/20/23 04/19/23  Nicholaus Brad RAMAN, NP    Allergies: Patient has no known allergies.    Review of Systems  Genitourinary:  Positive for vaginal pain.  All other systems reviewed and are negative.   Updated Vital Signs BP (!) 148/91   Pulse 78   Temp 98.8 F (37.1 C) (Oral)   Resp 20   Ht 5' 2 (1.575 m)   Wt 99.8 kg   LMP 07/22/2023   SpO2 100%   BMI  40.24 kg/m   Physical Exam Vitals and nursing note reviewed.  Constitutional:      General: She is not in acute distress.    Appearance: She is well-developed. She is obese.     Comments: Tearful  HENT:     Head: Atraumatic.  Eyes:     Conjunctiva/sclera: Conjunctivae normal.  Cardiovascular:     Rate and Rhythm: Normal rate and regular rhythm.     Pulses: Normal pulses.     Heart sounds: Normal heart sounds.  Pulmonary:     Effort: Pulmonary effort is normal.  Abdominal:     Palpations: Abdomen is soft.     Tenderness: There is no abdominal tenderness. There is no right CVA tenderness or left CVA tenderness.  Genitourinary:    Comments: Genital exam  deferred Musculoskeletal:     Cervical back: Neck supple.  Skin:    Findings: No rash.  Neurological:     Mental Status: She is alert.  Psychiatric:        Mood and Affect: Mood normal.     (all labs ordered are listed, but only abnormal results are displayed) Labs Reviewed - No data to display  EKG: None  Radiology: No results found.   Procedures   Medications Ordered in the ED - No data to display                                  Medical Decision Making  BP (!) 148/91   Pulse 78   Temp 98.8 F (37.1 C) (Oral)   Resp 20   Ht 5' 2 (1.575 m)   Wt 99.8 kg   LMP 07/22/2023   SpO2 100%   BMI 40.24 kg/m   75:70 AM  43 year old female significant history of chronic idiopathic constipation, obesity, hypertension, anxiety, depression, chronic pain brought here via EMS from home with complaints of vaginal pain.  Patient states for the past week she has has been experiencing pain and discomfort which she described as a pressure sensation in the vaginal area in her rectal area.  Symptoms seem to be persistent which makes her anxious.  She does not endorse any fever or chills no nausea vomiting diarrhea no constipation no dysuria hematuria vaginal bleeding or vaginal discharge.  She has been seen by her primary care provider twice for this.  The initial visit a few days ago she had a pelvic ultrasound which came back negative.  She then have a Pap smear done 7 days ago and have not gotten the results yet.  She was also seen at urgent care center for the rectal discomfort and yesterday and was given some cream which has not helped.  She is here with concerns that she may have rectal cancer.  Patient however denies any fever night sweats or weight loss and denies any history of alcohol or tobacco use.  She does endorse a family history of cancer.  Her last bowel movement was 2 days ago.  On exam this is an obese female, tearful laying in bed but in no acute discomfort.  Heart with  normal rate and rhythm, lungs are clear abdomen is soft nontender, genital exam is deferred during this visit.  EMR reviewed, in the past week patient has been seen and evaluated for her discomfort.  She has been seen by several different providers in several different setting for the same complaint.  She has had transvaginal ultrasound  and pelvic ultrasound which I have independently viewed and a it is negative.  She has had a Pap smear test and the results have not came back yet.  She also had both pelvic exam and rectal exam by 2 different providers both of which did not document any concerning finding.  We discussed options of CT scan for further assessment however when considering risk and benefit I felt he the risks outweigh benefit as her symptom does not support an infectious cause and malignancy would best be evaluated with a colonoscopy and less likely a CT scan. Through shared decision, we opted not for CT scan. She does have history of chronic constipation and I recommend MiraLAX  as it may aid with the symptom.  She did had a bowel movement 2 days ago and is fairly normal for her.  I have low suspicion for stool impaction as a symptom has been an ongoing issue for the past week.     Final diagnoses:  Rectal pain    ED Discharge Orders     None          Nivia Colon, PA-C 08/12/23 [id number]    Mannie Pac T, DO 08/13/23 1453

## 2023-08-12 NOTE — ED Triage Notes (Signed)
 Pt BIB [facility] d/t Vaginal pain for 5 days .  Pt has been seen for it but no results.    VS WNL per [facility]

## 2023-08-15 ENCOUNTER — Ambulatory Visit: Payer: Self-pay | Admitting: Student

## 2023-08-19 LAB — CYTOLOGY - PAP
Chlamydia: NEGATIVE
Comment: NEGATIVE
Comment: NEGATIVE
Comment: NEGATIVE
Comment: NORMAL
Diagnosis: REACTIVE
High risk HPV: NEGATIVE
Neisseria Gonorrhea: NEGATIVE
Trichomonas: NEGATIVE

## 2023-08-20 ENCOUNTER — Ambulatory Visit: Payer: Self-pay | Admitting: Family Medicine

## 2023-08-29 ENCOUNTER — Encounter: Payer: Self-pay | Admitting: Gastroenterology

## 2023-09-20 ENCOUNTER — Other Ambulatory Visit: Payer: Self-pay

## 2023-09-20 DIAGNOSIS — I10 Essential (primary) hypertension: Secondary | ICD-10-CM

## 2023-09-20 MED ORDER — AMLODIPINE BESYLATE 10 MG PO TABS: 10.0000 mg | ORAL_TABLET | Freq: Every day | ORAL | 0 refills | Status: DC

## 2023-09-20 MED ORDER — HYDROCHLOROTHIAZIDE 25 MG PO TABS: 25.0000 mg | ORAL_TABLET | Freq: Every day | ORAL | 3 refills | Status: AC

## 2023-10-16 ENCOUNTER — Other Ambulatory Visit: Payer: Self-pay | Admitting: Family Medicine

## 2023-10-17 ENCOUNTER — Telehealth: Payer: Self-pay

## 2023-10-17 NOTE — Telephone Encounter (Signed)
 Patient calls nurse line regarding questions with hydrochlorothiazide . Last rx was sent for 25 mg, however, patient was thinking that she was only supposed to be taking 12.5 mg hydrochlorothiazide .   I was unable to clearly see from notes which dose patient should be on.   Advised patient that I would reach out to provider for clarification on dosing.   Chiquita JAYSON English, RN

## 2023-10-18 NOTE — Telephone Encounter (Signed)
 Called patient. She did not answer, LVM asking her to call back for scheduling.   Chiquita JAYSON English, RN

## 2023-11-01 ENCOUNTER — Ambulatory Visit: Payer: MEDICAID | Admitting: Gastroenterology

## 2023-12-06 ENCOUNTER — Encounter: Payer: Self-pay | Admitting: Family Medicine

## 2023-12-06 ENCOUNTER — Other Ambulatory Visit (HOSPITAL_COMMUNITY)
Admission: RE | Admit: 2023-12-06 | Discharge: 2023-12-06 | Disposition: A | Discharge status: Home / Self Care | Payer: MEDICAID | Source: Ambulatory Visit | Attending: Family Medicine | Admitting: Family Medicine

## 2023-12-06 ENCOUNTER — Ambulatory Visit: Payer: MEDICAID | Admitting: Family Medicine

## 2023-12-06 VITALS — BP 132/88 | HR 83 | Ht 62.0 in | Wt 228.0 lb

## 2023-12-06 DIAGNOSIS — Z7251 High risk heterosexual behavior: Secondary | ICD-10-CM | POA: Insufficient documentation

## 2023-12-06 DIAGNOSIS — R300 Dysuria: Secondary | ICD-10-CM | POA: Insufficient documentation

## 2023-12-06 DIAGNOSIS — R197 Diarrhea, unspecified: Secondary | ICD-10-CM

## 2023-12-06 DIAGNOSIS — Z1231 Encounter for screening mammogram for malignant neoplasm of breast: Secondary | ICD-10-CM

## 2023-12-06 DIAGNOSIS — R350 Frequency of micturition: Secondary | ICD-10-CM

## 2023-12-06 LAB — POCT URINALYSIS DIP (MANUAL ENTRY)
Bilirubin, UA: NEGATIVE
Blood, UA: NEGATIVE
Glucose, UA: NEGATIVE mg/dL
Nitrite, UA: NEGATIVE
Protein Ur, POC: 30 mg/dL — AB
Spec Grav, UA: 1.02 (ref 1.010–1.025)
Urobilinogen, UA: 1 U/dL
pH, UA: 6 (ref 5.0–8.0)

## 2023-12-06 LAB — POCT WET PREP (WET MOUNT)
Clue Cells Wet Prep Whiff POC: NEGATIVE
Trichomonas Wet Prep HPF POC: ABSENT

## 2023-12-06 LAB — POCT URINE PREGNANCY: Preg Test, Ur: NEGATIVE

## 2023-12-06 MED ORDER — CEPHALEXIN 500 MG PO CAPS: 500.0000 mg | ORAL_CAPSULE | Freq: Two times a day (BID) | ORAL | 0 refills | Status: DC

## 2023-12-06 NOTE — Progress Notes (Signed)
    SUBJECTIVE:   CHIEF COMPLAINT / HPI:   Dysuria and diarrhea --pain in vaginal area and rectum for 4 days --rectal pain is not associated with BMs --no blood in toilet or dark stools --does not think she has a hemorrhoid, usually is constipated --still has sensation in GU/rectal area --no low back pain --diarrhea and more gassy, has had accidents with loose stool --no urinary accidents --no abnormal vaginal discharge or bleeding --no fevers or chills --No abdominal pain --no change in medicines - no new sexual partners - no concern for STI,  - Last sexual encounter: 2 weeks ago - Contraception: none currently -- has had 5 children   PERTINENT  PMH / PSH: HTN, GERD, obesity, anxiety/depression  OBJECTIVE:   BP 132/88   Pulse 83   Ht 5' 2 (1.575 m)   Wt 228 lb (103.4 kg)   LMP 11/18/2023   SpO2 97%   BMI 41.70 kg/m   General: Awake and conversant, no acute distress Pulm: normal work of breathing on room air GU (chaperone Scientist, Research (life Sciences), CMA present): normal external female genitalia, no vulvar lesions, normal vaginal rugae, scant thick white discharge at cervical os, no lesions/erythema on cervix.  No pelvic organ prolapse Neuro: No focal deficits Psych: Appropriate mood and affect   ASSESSMENT/PLAN:   Assessment & Plan Urinary frequency Dysuria Presentation most consistent with acute cystitis.  No nitrites on UA, however many leukocytes + dysuria.  Wet prep negative.  Will treat with 7 days Keflex .  Reviewed return precautions. - cephALEXin  (KEFLEX ) 500 MG capsule; Take 1 capsule (500 mg total) by mouth 2 (two) times daily.  Dispense: 14 capsule; Refill: 0 Diarrhea, unspecified type Possibly related to chronic constipation vs viral GI process.  Suspect rectal pain associated with this, possible the patient has hemorrhoid. Advised monitoring for change/worsening of symptoms. Unprotected sex Negative pregnancy test today.  Wet prep negative for trich. Also  checking for gonorrhea and chlamydia.   Sent MyChart message advising patient to make a follow up appt to discuss contraception options and advised condom use. - Cervicovaginal ancillary only     Rea Raring, MD [facility]

## 2023-12-06 NOTE — Patient Instructions (Signed)
 Thank you for coming in today! Here is a summary of what we discussed:  - It sounds like you have a urinary tract infection.  We will treat this with antibiotics for 1 week.  Please be sure to take all the antibiotics.  -You need a mammogram to prevent breast cancer.  Please schedule an appointment.  You can call [phone].      We are checking some labs today. If they are abnormal, I will call you. If they are normal, I will send you a MyChart message (if it is active) or a letter in the mail. If you do not hear about your labs in the next 2 weeks, please call the office.   Please call the clinic at [phone] if your symptoms worsen or you have any concerns.  Best, Dr Adele

## 2023-12-07 LAB — CERVICOVAGINAL ANCILLARY ONLY
Chlamydia: NEGATIVE
Comment: NEGATIVE
Comment: NEGATIVE
Comment: NORMAL
Neisseria Gonorrhea: NEGATIVE
Trichomonas: NEGATIVE

## 2023-12-08 ENCOUNTER — Ambulatory Visit: Payer: Self-pay | Admitting: Family Medicine

## 2023-12-16 ENCOUNTER — Ambulatory Visit: Payer: MEDICAID

## 2023-12-16 VITALS — BP 124/83 | HR 60 | Resp 18 | Ht 62.0 in | Wt 225.2 lb

## 2023-12-16 DIAGNOSIS — R102 Pelvic and perineal pain unspecified side: Secondary | ICD-10-CM

## 2023-12-16 MED ORDER — NORGESTIMATE-ETH ESTRADIOL 0.25-35 MG-MCG PO TABS: 1.0000 | ORAL_TABLET | Freq: Every day | ORAL | 11 refills | Status: AC

## 2023-12-16 NOTE — Progress Notes (Signed)
    SUBJECTIVE:   CHIEF COMPLAINT / HPI:   Kendra Harris is a 43 year old F that presents for vaginal and rectal pain. - Reports vaginal pain and rectal/buttocks pain. Current episode started 2 wks ago.  - Reports pain with intercourse - Also reports itching of vaginal area - Denies odor - Reports finishing her course of antibiotics for her UTI. Reports that symptoms did not change at all with this.  - Reports pain is at her vagina, feels like it inside. Does not feel like it is in her abdomen. - Also reports pain in her buttcheeks. She also had some pain around her rectum which has since resolved. Denies blood in stool. - Reports irregular periods, sometimes going 6 months without. LMP was 10/10th.   - Feels like her pain was extremely bad during her period.  - Denies pain with peeing - No vaginal discharge - Does not feel like vaginal tissue is extra thin or dry.  - Denies migrain w/ aura, clotting issues, smoking   Per Chart Review: - Pt was seen for similar symptoms mutlitple times including 08/05/23, 08/11/23, 08/12/23, 12/06/23. She has been treated for potential UTI, anal fissure, hemorrhoid. Pap smear, STI testing unremarkable. UA showed potential UTI, although antibx did not help. Transvag US  showed simple ovarian cyst, but no vaginal/uterine findings. Rectal exam previously only showed tenderness of rectum, but no noted masses or lesions.   PERTINENT  PMH / PSH: Hypertension, MDD  OBJECTIVE:   BP 124/83 (BP Location: Right Arm, Patient Position: Sitting, Cuff Size: Normal)   Pulse 60   Resp 18   Ht 5' 2 (1.575 m)   Wt 225 lb 3.2 oz (102.2 kg)   LMP 11/18/2023 (Approximate)   SpO2 98%   BMI 41.19 kg/m   General: Alert, pleasant well appearing womn. NAD. HEENT: NCAT. MMM. CV: RRR, no murmurs.   Resp: CTAB, no wheezing or crackles. Normal WOB on RA.  Abm: Soft, nontender, nondistended. BS present. Ext: Moves all ext spontaneously Skin: Warm, well perfused   ASSESSMENT/PLAN:    Assessment & Plan Vaginal pain Leading differential is endometriosis given rectal pain, chronic constipation, association of pain w/ menstrual cycle, vaginal pain, and negative workup thus far w/ transvag US , UA, rectal exam. Given age and chronic rectal pain, cf potential colorectal malignancy; will refer to GI for potential colonoscopy. Will trial Sprintec to see if this helps to control symptoms.  - Start Sprintec daily - Referral to OBGYN - Referral to GI  - Counseled on return precautions   Twyla Nearing, MD [facility]

## 2023-12-16 NOTE — Patient Instructions (Signed)
 1) I think your symptoms could be from endometriosis. This is a condition that occurs in some women where your endometrial cells accidentally spread to your vaginal wall, rectum, or other tissue and cause irritation. - I will send you to see OBGYN and a gastroenterologist. They can do further workup to see if there are other things causing this pain and what treatments are best for you.  - Start taking Sprintec daily. This is a birth control pill that can also control your periods and symptoms. This can make your constipation, pain, and intercourse better.

## 2024-01-13 ENCOUNTER — Ambulatory Visit: Payer: MEDICAID | Admitting: Student

## 2024-01-13 ENCOUNTER — Inpatient Hospital Stay (HOSPITAL_COMMUNITY): Payer: MEDICAID

## 2024-01-13 ENCOUNTER — Other Ambulatory Visit: Payer: Self-pay

## 2024-01-13 ENCOUNTER — Encounter (HOSPITAL_COMMUNITY): Payer: Self-pay | Admitting: Family Medicine

## 2024-01-13 ENCOUNTER — Inpatient Hospital Stay (HOSPITAL_COMMUNITY)
Admission: AD | Admit: 2024-01-13 | Discharge: 2024-01-13 | Disposition: A | Discharge status: Home / Self Care | Payer: MEDICAID | Attending: Family Medicine | Admitting: Family Medicine

## 2024-01-13 VITALS — BP 152/99 | HR 74 | Wt 220.4 lb

## 2024-01-13 DIAGNOSIS — Z3201 Encounter for pregnancy test, result positive: Secondary | ICD-10-CM | POA: Diagnosis not present

## 2024-01-13 DIAGNOSIS — O3680X0 Pregnancy with inconclusive fetal viability, not applicable or unspecified: Secondary | ICD-10-CM

## 2024-01-13 DIAGNOSIS — N926 Irregular menstruation, unspecified: Secondary | ICD-10-CM

## 2024-01-13 DIAGNOSIS — K625 Hemorrhage of anus and rectum: Secondary | ICD-10-CM

## 2024-01-13 DIAGNOSIS — R300 Dysuria: Secondary | ICD-10-CM | POA: Diagnosis not present

## 2024-01-13 DIAGNOSIS — N939 Abnormal uterine and vaginal bleeding, unspecified: Secondary | ICD-10-CM

## 2024-01-13 LAB — POCT URINALYSIS DIP (MANUAL ENTRY)
Glucose, UA: NEGATIVE mg/dL
Nitrite, UA: POSITIVE — AB
Protein Ur, POC: >=300 mg/dL — AB
Spec Grav, UA: 1.025 (ref 1.010–1.025)
Urobilinogen, UA: 2 U/dL — AB
pH, UA: 5.5 (ref 5.0–8.0)

## 2024-01-13 LAB — COMPREHENSIVE METABOLIC PANEL WITH GFR
ALT: 10 U/L (ref 0–44)
AST: 15 U/L (ref 15–41)
Albumin: 3.7 g/dL (ref 3.5–5.0)
Alkaline Phosphatase: 81 U/L (ref 38–126)
Anion gap: 10 (ref 5–15)
BUN: 11 mg/dL (ref 6–20)
CO2: 24 mmol/L (ref 22–32)
Calcium: 9 mg/dL (ref 8.9–10.3)
Chloride: 104 mmol/L (ref 98–111)
Creatinine, Ser: 1 mg/dL (ref 0.44–1.00)
GFR, Estimated: >60 mL/min (ref >60)
Glucose, Bld: 118 mg/dL — ABNORMAL HIGH (ref 70–99)
Potassium: 3.8 mmol/L (ref 3.5–5.1)
Sodium: 138 mmol/L (ref 135–145)
Total Bilirubin: 0.6 mg/dL (ref 0.0–1.2)
Total Protein: 8.1 g/dL (ref 6.5–8.1)

## 2024-01-13 LAB — WET PREP, GENITAL
Clue Cells Wet Prep HPF POC: NONE SEEN
Sperm: NONE SEEN
Trich, Wet Prep: NONE SEEN
WBC, Wet Prep HPF POC: <10 (ref <10)
Yeast Wet Prep HPF POC: NONE SEEN

## 2024-01-13 LAB — CBC
HCT: 39.5 % (ref 36.0–46.0)
Hemoglobin: 12.5 g/dL (ref 12.0–15.0)
MCH: 25.7 pg — ABNORMAL LOW (ref 26.0–34.0)
MCHC: 31.6 g/dL (ref 30.0–36.0)
MCV: 81.3 fL (ref 80.0–100.0)
Platelets: 389 K/uL (ref 150–400)
RBC: 4.86 MIL/uL (ref 3.87–5.11)
RDW: 13.2 % (ref 11.5–15.5)
WBC: 12.5 K/uL — ABNORMAL HIGH (ref 4.0–10.5)
nRBC: 0 % (ref 0.0–0.2)

## 2024-01-13 LAB — ABO/RH: ABO/RH(D): O POS

## 2024-01-13 LAB — HCG, QUANTITATIVE, PREGNANCY: hCG, Beta Chain, Quant, S: 634 m[IU]/mL — ABNORMAL HIGH (ref <5)

## 2024-01-13 LAB — POCT URINE PREGNANCY: Preg Test, Ur: POSITIVE — AB

## 2024-01-13 MED ORDER — CEPHALEXIN 500 MG PO CAPS: 500.0000 mg | ORAL_CAPSULE | Freq: Two times a day (BID) | ORAL | 0 refills | Status: DC

## 2024-01-13 MED ORDER — NITROFURANTOIN MONOHYD MACRO 100 MG PO CAPS: 100.0000 mg | ORAL_CAPSULE | Freq: Two times a day (BID) | ORAL | 0 refills | Status: DC

## 2024-01-13 NOTE — Patient Instructions (Addendum)
 For any pregnancy-related emergencies, please go to the Maternity Admissions Unit in the [facility] at [facility]. You will use hospital Entrance C.    Our clinic number is [phone].   Use Miralax  for constipation. If you have hard stools this will make it more likely for you to have rectal bleeding.  I am sending a referral for you to get a colonoscopy just to be safe. They should give you a call to get this scheduled.   I have ordered blood work today. I will send you a message through MyChart or send you a letter with your results. If there is an abnormal result, I will give you a call.    No future appointments.  Please arrive 15 minutes before your appointment to ensure smooth check in process.  If you are more than 15 minutes late, you may be asked to reschedule.   Please bring a list of your medications with you to all appointments.   Please call the clinic at [phone] if your symptoms worsen or you have any concerns.  Thank you for allowing me to participate in your care, Dr. Damien Pinal [facility]

## 2024-01-13 NOTE — MAU Note (Signed)
 Kendra Harris is a 43 y.o. at Unknown here in MAU reporting: VB that started Monday  light and pink in color and intermittent abd pain but none currently. Pad changed twice today.  UPT pos today in MD office LMP: 12/18/23 Onset of complaint: monday Pain score: 0/10 Vitals:   01/13/24 1645  BP: (!) 153/87  Pulse: 76  Resp: 16  Temp: 99.2 F (37.3 C)  SpO2: 99%     FHT: na  Lab orders placed from triage: ua swabs

## 2024-01-13 NOTE — Progress Notes (Signed)
    SUBJECTIVE:   CHIEF COMPLAINT / HPI:   Kendra Harris is a 43 y.o. female presenting for rectal bleeding and irregular menstrual periods.   Rectal bleeding:  Ongoing for several weeks  Notices blood coating the stool  Struggles with regular constipation  Notices some blood when she wipes not after bowel movements  Seems to be worse with her menstrual cycle  Irregular periods  Not on birth control  Sexually active Recently tested negative for STI Has not had any new partners  Reports occasional abdominal pain   PERTINENT  PMH / PSH: reviewed and updated.  OBJECTIVE:   BP (!) 152/99   Pulse 74   Wt 220 lb 6.4 oz (100 kg)   LMP 11/18/2023 (Approximate)   SpO2 100%   BMI 40.31 kg/m   Well-appearing, no acute distress Cardio: Regular rate, regular rhythm, no murmurs on exam. Pulm: Clear, no wheezing, no crackles. No increased work of breathing Abdominal: bowel sounds present, soft, non-tender, non-distended Extremities: no peripheral edema   Pelvic Exam: MA chaperone present, Rosaline Pesa  Normal external genitalia No abnormal discharge  No cervical motion tenderness  Cervix visualized with no lesions    No external hemorrhoids noted On rectal exam small <1 cm hemorrhoid palpated   ASSESSMENT/PLAN:   Assessment & Plan Irregular menstrual bleeding Positive pregnancy test Pelvic exam preformed today, regular bleeding observed  Deferred STI testing due to recent negative results in October  Urine pregnancy test today: POSITIVE  Sending over to the MAU for urgent pelvic ultrasound due to some mild pelvic pain to rule out ectopic pregnancy Rectal bleeding Suspect related to internal hemorrhoids  Prescribe Miralax  to reduce constipation  Refer to GI for diagnostic colonoscopy.  Check CBC to assess hemoglobin  Dysuria UA collected today and positive for Nitrites  Treat with 5 day course of Keflex      Damien Pinal, DO [facility]  [facility]

## 2024-01-13 NOTE — Discharge Instructions (Signed)
 Ms. Hendryx,  Please return to MAU for follow up blood work on Sunday 01/15/2024.    Thank you for trusting us  to care for you, Camie, Midwife

## 2024-01-13 NOTE — MAU Provider Note (Signed)
 None     Kendra Harris is a 43 y.o. G9P5005 pregnant female at [redacted]w[redacted]d who presents to MAU today with complaint of vaginal bleeding that started Monday. She reports it is light and she has intermittent abdominal pain, none currently.  She has had enough bleeding that she has had to change her pads twice today.  Reports positive UPT.   Has not yet initiated care  Pertinent items noted in HPI and remainder of comprehensive ROS otherwise negative.   O BP (!) 153/87 (BP Location: Right Arm)   Pulse 76   Temp 99.2 F (37.3 C) (Oral)   Resp 16   Ht 5' 2 (1.575 m)   Wt 100.3 kg   LMP 11/17/2023   SpO2 99%   BMI 40.46 kg/m  Physical Exam Vitals reviewed.  Constitutional:      General: She is not in acute distress.    Appearance: Normal appearance. She is not toxic-appearing or diaphoretic.  HENT:     Head: Normocephalic.  Cardiovascular:     Rate and Rhythm: Normal rate and regular rhythm.     Pulses: Normal pulses.     Heart sounds: Normal heart sounds.  Pulmonary:     Effort: Pulmonary effort is normal.     Breath sounds: Normal breath sounds.  Skin:    General: Skin is warm and dry.     Capillary Refill: Capillary refill takes less than 2 seconds.  Neurological:     General: No focal deficit present.     Mental Status: She is alert and oriented to person, place, and time.  Psychiatric:        Behavior: Behavior normal.        Thought Content: Thought content normal.        Judgment: Judgment normal.     Comments: Appropriately tearful/ concerned.        MDM:  Moderate  MAU Course:  Differential diagnosis includes: ectopic pregnancy, AB or viable pregnancy. Will evaluate with CUBA  Beta HCG 634, still below discriminatory zone. No evidence of IUP or ectopic on US .  - Hgb stable, reassuring for blood loss.   A Pregnancy of unknown anatomic location  Vaginal bleeding  Medical screening exam complete  P Discharge from MAU in stable condition  with strict return ectopic precautions Beta HCG 634, still below discriminatory zone. No evidence of IUP or ectopic on US .  - Follow up at MAU as scheduled 01/15/24 for follow up beta HCG.   Allergies as of 01/13/2024   No Known Allergies      Medication List     TAKE these medications    amLODipine  10 MG tablet Commonly known as: NORVASC  TAKE 1 TABLET(10 MG) BY MOUTH DAILY   cephALEXin  500 MG capsule Commonly known as: KEFLEX  Take 1 capsule (500 mg total) by mouth 2 (two) times daily for 5 days.   ferrous sulfate  325 (65 FE) MG tablet Take 1 tablet (325 mg total) by mouth daily with breakfast.   hydrochlorothiazide  25 MG tablet Commonly known as: HYDRODIURIL  Take 1 tablet (25 mg total) by mouth daily.   losartan  25 MG tablet Commonly known as: COZAAR  Take 1 tablet (25 mg total) by mouth at bedtime.   NIFEdipine  Powd Please compound into 0.2% ointment to be applied to anal area tid.   norgestimate -ethinyl estradiol  0.25-35 MG-MCG tablet Commonly known as: Sprintec 28 Take 1 tablet by mouth daily.   omeprazole  40 MG capsule Commonly known as: PRILOSEC Take  1 capsule (40 mg total) by mouth daily.   polyethylene glycol powder 17 GM/SCOOP powder Commonly known as: GLYCOLAX /MIRALAX  Take 17 g by mouth daily.   prochlorperazine  10 MG tablet Commonly known as: COMPAZINE  Take 1 tablet (10 mg total) by mouth every 8 (eight) hours as needed (for headache).   traZODone  50 MG tablet Commonly known as: DESYREL  Take 1 tablet (50 mg total) by mouth at bedtime as needed for sleep.       ASK your doctor about these medications    DULoxetine  20 MG capsule Commonly known as: Cymbalta  Take 1 capsule (20 mg total) by mouth 2 (two) times daily.        Camie Rote, MSN, CNM 01/14/2024 1:55 PM  Certified Nurse Midwife, [facility]

## 2024-01-15 ENCOUNTER — Other Ambulatory Visit: Payer: Self-pay | Admitting: Certified Nurse Midwife

## 2024-01-15 ENCOUNTER — Inpatient Hospital Stay (HOSPITAL_COMMUNITY)
Admit: 2024-01-15 | Discharge: 2024-01-15 | Disposition: A | Payer: MEDICAID | Attending: Obstetrics and Gynecology | Admitting: Obstetrics and Gynecology

## 2024-01-15 ENCOUNTER — Inpatient Hospital Stay (HOSPITAL_COMMUNITY): Admission: RE | Admit: 2024-01-15 | Discharge: 2024-01-15 | Payer: MEDICAID | Attending: Obstetrics and Gynecology

## 2024-01-15 ENCOUNTER — Other Ambulatory Visit: Payer: Self-pay

## 2024-01-15 DIAGNOSIS — N939 Abnormal uterine and vaginal bleeding, unspecified: Secondary | ICD-10-CM

## 2024-01-15 DIAGNOSIS — O039 Complete or unspecified spontaneous abortion without complication: Secondary | ICD-10-CM

## 2024-01-15 DIAGNOSIS — Z3A08 8 weeks gestation of pregnancy: Secondary | ICD-10-CM

## 2024-01-15 LAB — HCG, QUANTITATIVE, PREGNANCY: hCG, Beta Chain, Quant, S: 383 m[IU]/mL — ABNORMAL HIGH (ref <5)

## 2024-01-15 NOTE — MAU Provider Note (Signed)
 None     S [name] is a 43 y.o. G89P5005 pregnant female at [redacted]w[redacted]d who presents to MAU today with complaint of follow up beta Hcg. She was seen 2 days prior for vaginal bleeding with instructions to return to MAU today for follow up lab. She reports decreased bleeding since she was seen 2 days prior. Reports abdominal cramping has continued. Has not taken anything for pain.   Has not yet initiated prenatal care this pregnancy.   Pertinent items noted in HPI and remainder of comprehensive ROS otherwise negative.   O BP 133/86 (BP Location: Right Arm)   Pulse 71   Temp 98.7 F (37.1 C) (Oral)   Resp 16   Ht 5' 2 (1.575 m)   Wt 101.1 kg   LMP 11/17/2023   SpO2 99%   BMI 40.75 kg/m  Physical Exam Vitals reviewed.  Constitutional:      Appearance: Normal appearance.  HENT:     Head: Normocephalic.  Cardiovascular:     Rate and Rhythm: Normal rate.     Pulses: Normal pulses.  Pulmonary:     Effort: Pulmonary effort is normal.  Skin:    General: Skin is warm and dry.     Capillary Refill: Capillary refill takes less than 2 seconds.  Neurological:     General: No focal deficit present.     Mental Status: She is alert and oriented to person, place, and time.      MDM:  Low  MAU Course:  Drop in beta hcg from previous visit, mostly likely corresponding to SAB. However, due lack of confirmatory IUP on US , follow up lab work in 10 days indicated. Dr. Nicholaus consulted and agrees to POC.   A Miscarriage  Abnormal uterine bleeding (AUB)  [redacted] weeks gestation of pregnancy  Medical screening exam complete  P Discharge from MAU in stable condition with routine precautions, strict return precautions advised.  Follow up at [facility] 01/25/2024 for follow up beta Hcg. Order placed and appointment scheduled.  Patient expresses relief and understanding of plan of care.   Allergies as of 01/15/2024   No Known Allergies      Medication List     TAKE these medications     amLODipine  10 MG tablet Commonly known as: NORVASC  TAKE 1 TABLET(10 MG) BY MOUTH DAILY   cephALEXin  500 MG capsule Commonly known as: KEFLEX  Take 1 capsule (500 mg total) by mouth 2 (two) times daily for 5 days.   DULoxetine  20 MG capsule Commonly known as: Cymbalta  Take 1 capsule (20 mg total) by mouth 2 (two) times daily.   ferrous sulfate  325 (65 FE) MG tablet Take 1 tablet (325 mg total) by mouth daily with breakfast.   hydrochlorothiazide  25 MG tablet Commonly known as: HYDRODIURIL  Take 1 tablet (25 mg total) by mouth daily.   losartan  25 MG tablet Commonly known as: COZAAR  Take 1 tablet (25 mg total) by mouth at bedtime.   NIFEdipine  Powd Please compound into 0.2% ointment to be applied to anal area tid.   norgestimate -ethinyl estradiol  0.25-35 MG-MCG tablet Commonly known as: Sprintec 28 Take 1 tablet by mouth daily.   omeprazole  40 MG capsule Commonly known as: PRILOSEC Take 1 capsule (40 mg total) by mouth daily.   polyethylene glycol powder 17 GM/SCOOP powder Commonly known as: GLYCOLAX /MIRALAX  Take 17 g by mouth daily.   prochlorperazine  10 MG tablet Commonly known as: COMPAZINE  Take 1 tablet (10 mg total) by mouth every 8 (eight)  hours as needed (for headache).   traZODone  50 MG tablet Commonly known as: DESYREL  Take 1 tablet (50 mg total) by mouth at bedtime as needed for sleep.        Camie Rote, MSN, CNM 01/15/2024 1:45 PM  Certified Nurse Midwife, [facility]

## 2024-01-15 NOTE — MAU Note (Signed)
 Kendra Harris is a 43 y.o. at [redacted]w[redacted]d here in MAU reporting: here for repeat bhcg Reports vaginal bleeding has lightened up a lot since 2 days ago. Reporting the same abdominal cramping today. Has not taken anything for pain today.  Pain score: 3 Vitals:   01/15/24 1130  BP: 133/86  Pulse: 71  Resp: 16  Temp: 98.7 F (37.1 C)  SpO2: 99%      Lab orders placed from triage:  bhcg

## 2024-01-16 ENCOUNTER — Telehealth: Payer: Self-pay

## 2024-01-16 LAB — GC/CHLAMYDIA PROBE AMP (~~LOC~~) NOT AT ARMC
Chlamydia: NEGATIVE
Comment: NEGATIVE
Comment: NORMAL
Neisseria Gonorrhea: NEGATIVE

## 2024-01-16 MED ORDER — CEPHALEXIN 500 MG PO CAPS: 500.0000 mg | ORAL_CAPSULE | Freq: Two times a day (BID) | ORAL | 0 refills | Status: AC

## 2024-01-16 NOTE — Telephone Encounter (Signed)
Called patient and provided with update.   Devonte Migues C Armya Westerhoff, RN  

## 2024-01-16 NOTE — Telephone Encounter (Signed)
 Patient calls nurse line regarding prescriptions that were sent over on 12/5. She picked up Macrobid  and Keflex , however, she left prescriptions in [place].   She is needing another prescription to be sent over.   It appears that Macrobid  was discontinued.   Will forward to prescriber.   Chiquita JAYSON English, RN

## 2024-01-25 ENCOUNTER — Other Ambulatory Visit: Payer: Self-pay

## 2024-02-29 ENCOUNTER — Ambulatory Visit: Payer: MEDICAID | Admitting: Student

## 2024-02-29 ENCOUNTER — Encounter: Payer: Self-pay | Admitting: Student

## 2024-02-29 VITALS — BP 143/88 | HR 65 | Wt 229.0 lb

## 2024-02-29 DIAGNOSIS — K5900 Constipation, unspecified: Secondary | ICD-10-CM | POA: Diagnosis not present

## 2024-02-29 DIAGNOSIS — R1030 Lower abdominal pain, unspecified: Secondary | ICD-10-CM

## 2024-02-29 DIAGNOSIS — O021 Missed abortion: Secondary | ICD-10-CM

## 2024-02-29 MED ORDER — POLYETHYLENE GLYCOL 3350 17 GM/SCOOP PO POWD: ORAL | 0 refills | Status: AC

## 2024-02-29 NOTE — Patient Instructions (Signed)
 It was great to see you today!   I recommend starting a bowel clean out with Miralax  to help with your stomach and rectal pain.   I have ordered lab work today. I will send you a message through MyChart or send you a letter with your results. If there is an abnormal result, I will give you a call.    No future appointments.  Please arrive 15 minutes before your appointment to ensure smooth check in process.  If you are more than 15 minutes late, you may be asked to reschedule.   Please bring a list of your medications with you to all appointments.   Please call the clinic at [phone] if your symptoms worsen or you have any concerns.  Thank you for allowing me to participate in your care, Dr. Damien Pinal [facility]

## 2024-02-29 NOTE — Progress Notes (Signed)
" ° ° °  SUBJECTIVE:   CHIEF COMPLAINT / HPI:   Kendra Harris is a 44 year old female who presents with chronic constipation and rectal discomfort.  She has intermittent cramping and burning in the rectal and genital area that began last year, resolved, and has recently recurred. Each episode lasts about an hour, then subsides and returns. She is chronically constipated with increased bowel movements and gas but no diarrhea or blood in the stool. She feels rectal pressure, worse with sitting, and a sensation that something wants to come out, though she is unsure if it is stool.  She has heavy menstrual periods. Her last cycle was around the 13th or 14th of last month and she is due for her next cycle soon. She does not notice symptom changes with her menses. She has no urinary pain, difficulty emptying, or incontinence, but has increased urinary frequency. She has no accidental bowel movements or saddle anesthesia.  She had a missed abortion last month and has been getting serial beta HCG tests, but missed her scheduled follow-up blood work on the 17th.  She denies nausea, vomiting, appetite changes, rash, or visible abnormalities in the affected areas.  PERTINENT  PMH / PSH: reviewed and updated.  OBJECTIVE:   BP (!) 143/88   Pulse 65   Wt 229 lb (103.9 kg)   LMP 11/17/2023   SpO2 100%   Breastfeeding Unknown   BMI 41.88 kg/m   Well-appearing, no acute distress Cardio: Regular rate, regular rhythm, no murmurs on exam. Pulm: Clear, no wheezing, no crackles. No increased work of breathing Abdominal: bowel sounds present, soft, non-tender, non-distended GU: rectal exam normal, no external hemorrhoids noted, no stool ball palpated on exam   Limited ultrasound obtained for learning purposes. Formal testing and follow up as below. Discussed limitations with patient.  Bladder and uterus identified. Uterine stripe observed without signs of IUP Left and right adnexa scanned without focal  fluid collections   ASSESSMENT/PLAN:   Assessment & Plan Lower abdominal pain Constipation, unspecified constipation type Most likely chronic constipation.  Will treat with bowel clean out at home  Prescribed miralax   Patient to follow up in 1-2 weeks if no improvement  Missed abortion Will recheck serum hcg today  If elevated will need to schedule repeat US       Damien Pinal, DO [facility]  "

## 2024-03-01 ENCOUNTER — Ambulatory Visit: Payer: Self-pay | Admitting: Student

## 2024-03-01 LAB — HUMAN CHORIONIC GONADOTROPIN(HCG),B-SUBUNIT,QUANTITATIVE): HCG, Beta Chain, Quant, S: <1 m[IU]/mL

## 2024-03-16 ENCOUNTER — Ambulatory Visit: Payer: MEDICAID | Admitting: Family Medicine

## 2024-03-19 ENCOUNTER — Ambulatory Visit: Payer: MEDICAID | Admitting: Student
